# Patient Record
Sex: Female | Born: 1935
Health system: Southern US, Community
[De-identification: ages and names within clinical notes are randomized; demographics above are authoritative.]

## PROBLEM LIST (undated history)

## (undated) DIAGNOSIS — J42 Unspecified chronic bronchitis: Secondary | ICD-10-CM

## (undated) DIAGNOSIS — H44812 Hemophthalmos, left eye: Secondary | ICD-10-CM

## (undated) DIAGNOSIS — S22000A Wedge compression fracture of unspecified thoracic vertebra, initial encounter for closed fracture: Secondary | ICD-10-CM

## (undated) DIAGNOSIS — C866 Primary cutaneous CD30-positive T-cell proliferations not having achieved remission: Secondary | ICD-10-CM

## (undated) DIAGNOSIS — C50919 Malignant neoplasm of unspecified site of unspecified female breast: Secondary | ICD-10-CM

## (undated) DIAGNOSIS — C9110 Chronic lymphocytic leukemia of B-cell type not having achieved remission: Secondary | ICD-10-CM

## (undated) HISTORY — DX: Malignant neoplasm of unspecified site of unspecified female breast: C50.919

## (undated) HISTORY — PX: TONSILLECTOMY: SUR1361

## (undated) HISTORY — DX: Wedge compression fracture of unspecified thoracic vertebra, initial encounter for closed fracture: S22.000A

## (undated) HISTORY — DX: Primary cutaneous CD30-positive T-cell proliferations: C86.6

## (undated) HISTORY — PX: BREAST SURGERY: SHX581

## (undated) HISTORY — DX: Unspecified chronic bronchitis: J42

## (undated) HISTORY — DX: Primary cutaneous CD30-positive T-cell proliferations not having achieved remission: C86.60

## (undated) HISTORY — DX: Hemophthalmos, left eye: H44.812

## (undated) HISTORY — DX: Chronic lymphocytic leukemia of B-cell type not having achieved remission: C91.10

## (undated) HISTORY — PX: PARTIAL COLECTOMY: SHX5273

## (undated) HISTORY — PX: OOPHORECTOMY: SHX86

## (undated) HISTORY — PX: BILATERAL TOTAL MASTECTOMY WITH AXILLARY LYMPH NODE DISSECTION: SHX6364

## (undated) HISTORY — PX: OTHER SURGICAL HISTORY: SHX169

---

## 1978-07-30 HISTORY — PX: ABDOMINAL HYSTERECTOMY: SHX81

## 1980-07-30 HISTORY — PX: MASTECTOMY: SHX3

## 1998-05-03 ENCOUNTER — Other Ambulatory Visit: Admission: RE | Admit: 1998-05-03 | Discharge: 1998-05-03 | Payer: Self-pay | Admitting: Gynecology

## 1999-07-31 HISTORY — PX: OTHER SURGICAL HISTORY: SHX169

## 1999-09-08 ENCOUNTER — Other Ambulatory Visit: Admission: RE | Admit: 1999-09-08 | Discharge: 1999-09-08 | Payer: Self-pay | Admitting: Gynecology

## 2000-01-18 ENCOUNTER — Other Ambulatory Visit: Admission: RE | Admit: 2000-01-18 | Discharge: 2000-01-18 | Payer: Self-pay | Admitting: Plastic Surgery

## 2000-01-18 ENCOUNTER — Encounter (INDEPENDENT_AMBULATORY_CARE_PROVIDER_SITE_OTHER): Payer: Self-pay | Admitting: Specialist

## 2001-02-25 ENCOUNTER — Other Ambulatory Visit: Admission: RE | Admit: 2001-02-25 | Discharge: 2001-02-25 | Payer: Self-pay | Admitting: Gynecology

## 2002-03-09 ENCOUNTER — Other Ambulatory Visit: Admission: RE | Admit: 2002-03-09 | Discharge: 2002-03-09 | Payer: Self-pay | Admitting: Gynecology

## 2003-03-12 ENCOUNTER — Encounter: Payer: Self-pay | Admitting: Internal Medicine

## 2003-03-12 ENCOUNTER — Encounter: Admission: RE | Admit: 2003-03-12 | Discharge: 2003-03-12 | Payer: Self-pay | Admitting: Internal Medicine

## 2003-05-24 ENCOUNTER — Ambulatory Visit (HOSPITAL_COMMUNITY): Admission: RE | Admit: 2003-05-24 | Discharge: 2003-05-24 | Payer: Self-pay | Admitting: Gastroenterology

## 2003-06-10 ENCOUNTER — Other Ambulatory Visit: Admission: RE | Admit: 2003-06-10 | Discharge: 2003-06-10 | Payer: Self-pay | Admitting: Gynecology

## 2004-08-23 ENCOUNTER — Ambulatory Visit (HOSPITAL_COMMUNITY): Admission: RE | Admit: 2004-08-23 | Discharge: 2004-08-23 | Payer: Self-pay | Admitting: *Deleted

## 2004-10-09 ENCOUNTER — Encounter: Admission: RE | Admit: 2004-10-09 | Discharge: 2004-10-09 | Payer: Self-pay | Admitting: Internal Medicine

## 2004-10-17 ENCOUNTER — Other Ambulatory Visit: Admission: RE | Admit: 2004-10-17 | Discharge: 2004-10-17 | Payer: Self-pay | Admitting: Gynecology

## 2005-05-18 ENCOUNTER — Encounter: Admission: RE | Admit: 2005-05-18 | Discharge: 2005-05-18 | Payer: Self-pay | Admitting: Gastroenterology

## 2005-05-18 ENCOUNTER — Inpatient Hospital Stay (HOSPITAL_COMMUNITY): Admission: EM | Admit: 2005-05-18 | Discharge: 2005-05-23 | Payer: Self-pay | Admitting: *Deleted

## 2005-05-24 ENCOUNTER — Ambulatory Visit (HOSPITAL_COMMUNITY): Admission: RE | Admit: 2005-05-24 | Discharge: 2005-05-24 | Payer: Self-pay | Admitting: Gastroenterology

## 2005-05-28 ENCOUNTER — Ambulatory Visit (HOSPITAL_COMMUNITY): Admission: RE | Admit: 2005-05-28 | Discharge: 2005-05-28 | Payer: Self-pay | Admitting: Gastroenterology

## 2005-08-29 ENCOUNTER — Ambulatory Visit (HOSPITAL_COMMUNITY): Admission: RE | Admit: 2005-08-29 | Discharge: 2005-08-29 | Payer: Self-pay | Admitting: Gastroenterology

## 2005-10-08 ENCOUNTER — Inpatient Hospital Stay (HOSPITAL_COMMUNITY): Admission: RE | Admit: 2005-10-08 | Discharge: 2005-10-14 | Payer: Self-pay | Admitting: General Surgery

## 2005-10-08 ENCOUNTER — Encounter (INDEPENDENT_AMBULATORY_CARE_PROVIDER_SITE_OTHER): Payer: Self-pay | Admitting: *Deleted

## 2005-11-26 ENCOUNTER — Ambulatory Visit (HOSPITAL_COMMUNITY): Admission: RE | Admit: 2005-11-26 | Discharge: 2005-11-26 | Payer: Self-pay | Admitting: Gastroenterology

## 2005-12-19 ENCOUNTER — Other Ambulatory Visit: Admission: RE | Admit: 2005-12-19 | Discharge: 2005-12-19 | Payer: Self-pay | Admitting: Gynecology

## 2006-04-19 ENCOUNTER — Encounter: Admission: RE | Admit: 2006-04-19 | Discharge: 2006-04-19 | Payer: Self-pay | Admitting: Internal Medicine

## 2006-05-22 ENCOUNTER — Ambulatory Visit (HOSPITAL_COMMUNITY): Admission: RE | Admit: 2006-05-22 | Discharge: 2006-05-24 | Payer: Self-pay

## 2006-11-11 ENCOUNTER — Ambulatory Visit (HOSPITAL_BASED_OUTPATIENT_CLINIC_OR_DEPARTMENT_OTHER): Admission: RE | Admit: 2006-11-11 | Discharge: 2006-11-11 | Payer: Self-pay | Admitting: Surgery

## 2006-11-11 ENCOUNTER — Encounter (INDEPENDENT_AMBULATORY_CARE_PROVIDER_SITE_OTHER): Payer: Self-pay | Admitting: *Deleted

## 2006-12-31 ENCOUNTER — Other Ambulatory Visit: Admission: RE | Admit: 2006-12-31 | Discharge: 2006-12-31 | Payer: Self-pay | Admitting: Gynecology

## 2007-01-20 ENCOUNTER — Ambulatory Visit: Payer: Self-pay | Admitting: Internal Medicine

## 2007-04-08 DIAGNOSIS — Z8719 Personal history of other diseases of the digestive system: Secondary | ICD-10-CM

## 2007-05-09 ENCOUNTER — Ambulatory Visit: Payer: Self-pay | Admitting: Internal Medicine

## 2007-06-30 ENCOUNTER — Ambulatory Visit: Payer: Self-pay | Admitting: Internal Medicine

## 2007-06-30 DIAGNOSIS — R197 Diarrhea, unspecified: Secondary | ICD-10-CM

## 2007-10-26 ENCOUNTER — Encounter: Payer: Self-pay | Admitting: Internal Medicine

## 2007-10-27 ENCOUNTER — Ambulatory Visit: Payer: Self-pay | Admitting: Internal Medicine

## 2007-10-27 DIAGNOSIS — J4489 Other specified chronic obstructive pulmonary disease: Secondary | ICD-10-CM | POA: Insufficient documentation

## 2007-10-27 DIAGNOSIS — M810 Age-related osteoporosis without current pathological fracture: Secondary | ICD-10-CM

## 2007-10-27 DIAGNOSIS — E785 Hyperlipidemia, unspecified: Secondary | ICD-10-CM

## 2007-10-27 DIAGNOSIS — J449 Chronic obstructive pulmonary disease, unspecified: Secondary | ICD-10-CM | POA: Insufficient documentation

## 2007-10-27 DIAGNOSIS — Z85828 Personal history of other malignant neoplasm of skin: Secondary | ICD-10-CM

## 2007-10-27 LAB — CONVERTED CEMR LAB
ALT: 38 units/L — ABNORMAL HIGH (ref 0–35)
AST: 37 units/L (ref 0–37)
Albumin: 3.3 g/dL — ABNORMAL LOW (ref 3.5–5.2)
Alkaline Phosphatase: 52 units/L (ref 39–117)
BUN: 16 mg/dL (ref 6–23)
Basophils Absolute: 0 10*3/uL (ref 0.0–0.1)
Basophils Relative: 0 % (ref 0.0–1.0)
Bilirubin, Direct: 0.2 mg/dL (ref 0.0–0.3)
CO2: 28 meq/L (ref 19–32)
Calcium: 9 mg/dL (ref 8.4–10.5)
Chloride: 106 meq/L (ref 96–112)
Cholesterol: 198 mg/dL (ref 0–200)
Creatinine, Ser: 0.7 mg/dL (ref 0.4–1.2)
Eosinophils Absolute: 0.2 10*3/uL (ref 0.0–0.7)
Eosinophils Relative: 1.5 % (ref 0.0–5.0)
GFR calc Af Amer: 106 mL/min
GFR calc non Af Amer: 88 mL/min
Glucose, Bld: 90 mg/dL (ref 70–99)
HCT: 43.6 % (ref 36.0–46.0)
HDL: 56.9 mg/dL (ref >39.0)
Hemoglobin: 14 g/dL (ref 12.0–15.0)
LDL Cholesterol: 129 mg/dL — ABNORMAL HIGH (ref 0–99)
Lymphocytes Relative: 70.3 % — ABNORMAL HIGH (ref 12.0–46.0)
MCHC: 32 g/dL (ref 30.0–36.0)
MCV: 102.4 fL — ABNORMAL HIGH (ref 78.0–100.0)
Monocytes Absolute: 1 10*3/uL (ref 0.1–1.0)
Monocytes Relative: 6.5 % (ref 3.0–12.0)
Neutro Abs: 3.3 10*3/uL (ref 1.4–7.7)
Neutrophils Relative %: 21.7 % — ABNORMAL LOW (ref 43.0–77.0)
Platelets: 179 10*3/uL (ref 150–400)
Potassium: 4 meq/L (ref 3.5–5.1)
RBC: 4.26 M/uL (ref 3.87–5.11)
RDW: 12.7 % (ref 11.5–14.6)
Sodium: 140 meq/L (ref 135–145)
TSH: 1.57 microintl units/mL (ref 0.35–5.50)
Total Bilirubin: 1 mg/dL (ref 0.3–1.2)
Total CHOL/HDL Ratio: 3.5
Total Protein: 6.8 g/dL (ref 6.0–8.3)
Triglycerides: 61 mg/dL (ref 0–149)
VLDL: 12 mg/dL (ref 0–40)
WBC: 15.1 10*3/uL — ABNORMAL HIGH (ref 4.5–10.5)

## 2008-01-01 ENCOUNTER — Other Ambulatory Visit: Admission: RE | Admit: 2008-01-01 | Discharge: 2008-01-01 | Payer: Self-pay | Admitting: Gynecology

## 2008-02-02 ENCOUNTER — Ambulatory Visit: Payer: Self-pay | Admitting: Internal Medicine

## 2008-02-02 DIAGNOSIS — J029 Acute pharyngitis, unspecified: Secondary | ICD-10-CM

## 2008-04-17 ENCOUNTER — Ambulatory Visit: Payer: Self-pay | Admitting: Family Medicine

## 2008-04-17 ENCOUNTER — Telehealth (INDEPENDENT_AMBULATORY_CARE_PROVIDER_SITE_OTHER): Payer: Self-pay | Admitting: *Deleted

## 2008-04-17 DIAGNOSIS — J069 Acute upper respiratory infection, unspecified: Secondary | ICD-10-CM | POA: Insufficient documentation

## 2008-05-06 ENCOUNTER — Ambulatory Visit: Payer: Self-pay | Admitting: Internal Medicine

## 2008-07-05 ENCOUNTER — Ambulatory Visit: Payer: Self-pay | Admitting: Gynecology

## 2008-10-28 ENCOUNTER — Ambulatory Visit: Payer: Self-pay | Admitting: Internal Medicine

## 2008-10-28 DIAGNOSIS — I872 Venous insufficiency (chronic) (peripheral): Secondary | ICD-10-CM | POA: Insufficient documentation

## 2009-01-16 ENCOUNTER — Emergency Department (HOSPITAL_COMMUNITY): Admission: EM | Admit: 2009-01-16 | Discharge: 2009-01-16 | Payer: Self-pay | Admitting: Emergency Medicine

## 2009-01-20 ENCOUNTER — Ambulatory Visit: Payer: Self-pay | Admitting: Internal Medicine

## 2009-01-20 DIAGNOSIS — M1990 Unspecified osteoarthritis, unspecified site: Secondary | ICD-10-CM

## 2009-02-17 ENCOUNTER — Ambulatory Visit: Payer: Self-pay | Admitting: Internal Medicine

## 2009-02-17 LAB — CONVERTED CEMR LAB
ALT: 19 U/L
AST: 27 U/L
Albumin: 4 g/dL
Alkaline Phosphatase: 70 U/L
BUN: 12 mg/dL
Basophils Absolute: 0 K/uL
Basophils Relative: 0.1 %
Bilirubin, Direct: 0.1 mg/dL
CO2: 31 meq/L
Calcium: 9.2 mg/dL
Chloride: 109 meq/L
Cholesterol: 192 mg/dL
Creatinine, Ser: 0.8 mg/dL
Eosinophils Absolute: 0.2 K/uL
Eosinophils Relative: 1.7 %
GFR calc non Af Amer: 75.03 mL/min
Glucose, Bld: 97 mg/dL
HCT: 42.4 %
HDL: 58.5 mg/dL
Hemoglobin: 14.4 g/dL
LDL Cholesterol: 119 mg/dL — ABNORMAL HIGH
Lymphocytes Relative: 71.9 % — ABNORMAL HIGH
Lymphs Abs: 10.5 K/uL — ABNORMAL HIGH
MCHC: 33.9 g/dL
MCV: 101 fL — ABNORMAL HIGH
Monocytes Absolute: 0.7 K/uL
Monocytes Relative: 5.1 %
Neutro Abs: 3.1 K/uL
Neutrophils Relative %: 21.2 % — ABNORMAL LOW
Platelets: 159 K/uL
Potassium: 4 meq/L
RBC: 4.2 M/uL
RDW: 12.5 %
Sodium: 148 meq/L — ABNORMAL HIGH
TSH: 0.97 u[IU]/mL
Total Bilirubin: 1.2 mg/dL
Total CHOL/HDL Ratio: 3
Total Protein: 7.5 g/dL
Triglycerides: 74 mg/dL
VLDL: 14.8 mg/dL
WBC: 14.5 10*3/microliter — ABNORMAL HIGH

## 2009-02-21 LAB — CONVERTED CEMR LAB: Vit D, 25-Hydroxy: 29 ng/mL — ABNORMAL LOW (ref 30–89)

## 2009-02-28 ENCOUNTER — Ambulatory Visit: Payer: Self-pay | Admitting: Gynecology

## 2009-02-28 ENCOUNTER — Encounter: Payer: Self-pay | Admitting: Internal Medicine

## 2009-03-02 ENCOUNTER — Ambulatory Visit: Payer: Self-pay | Admitting: Internal Medicine

## 2009-03-02 DIAGNOSIS — C9110 Chronic lymphocytic leukemia of B-cell type not having achieved remission: Secondary | ICD-10-CM | POA: Insufficient documentation

## 2009-03-08 ENCOUNTER — Ambulatory Visit: Payer: Self-pay | Admitting: Oncology

## 2009-03-15 ENCOUNTER — Encounter: Payer: Self-pay | Admitting: Internal Medicine

## 2009-03-15 ENCOUNTER — Other Ambulatory Visit: Admission: RE | Admit: 2009-03-15 | Discharge: 2009-03-15 | Payer: Self-pay | Admitting: Oncology

## 2009-03-15 ENCOUNTER — Encounter: Payer: Self-pay | Admitting: Oncology

## 2009-03-15 LAB — CMP (CANCER CENTER ONLY)
ALT(SGPT): 30 U/L (ref 10–47)
AST: 29 U/L (ref 11–38)
Albumin: 3.2 g/dL — ABNORMAL LOW (ref 3.3–5.5)
Alkaline Phosphatase: 54 U/L (ref 26–84)
Calcium: 9.2 mg/dL (ref 8.0–10.3)
Chloride: 106 mEq/L (ref 98–108)
Potassium: 4.2 mEq/L (ref 3.3–4.7)
Sodium: 139 mEq/L (ref 128–145)
Total Protein: 6.7 g/dL (ref 6.4–8.1)

## 2009-03-15 LAB — CBC WITH DIFFERENTIAL (CANCER CENTER ONLY)
Eosinophils Absolute: 0.4 10*3/uL (ref 0.0–0.5)
HGB: 14.1 g/dL (ref 11.6–15.9)
LYMPH#: 12.3 10*3/uL — ABNORMAL HIGH (ref 0.9–3.3)
MCH: 33.5 pg (ref 26.0–34.0)
MONO#: 0.8 10*3/uL (ref 0.1–0.9)
MONO%: 4.7 % (ref 0.0–13.0)
NEUT#: 3.2 10*3/uL (ref 1.5–6.5)
Platelets: 147 10*3/uL (ref 145–400)
RBC: 4.2 10*6/uL (ref 3.70–5.32)
WBC: 17 10*3/uL — ABNORMAL HIGH (ref 3.9–10.0)

## 2009-03-15 LAB — MORPHOLOGY - CHCC SATELLITE: PLT EST ~~LOC~~: ADEQUATE

## 2009-03-16 LAB — HAPTOGLOBIN: Haptoglobin: 91 mg/dL (ref 16–200)

## 2009-03-16 LAB — DIRECT ANTIGLOBULIN TEST (NOT AT ARMC)
DAT (Complement): NEGATIVE
DAT IgG: NEGATIVE

## 2009-03-24 ENCOUNTER — Encounter: Payer: Self-pay | Admitting: Internal Medicine

## 2009-04-26 ENCOUNTER — Ambulatory Visit: Payer: Self-pay | Admitting: Internal Medicine

## 2009-05-27 ENCOUNTER — Encounter (INDEPENDENT_AMBULATORY_CARE_PROVIDER_SITE_OTHER): Payer: Self-pay | Admitting: *Deleted

## 2009-05-31 ENCOUNTER — Encounter: Admission: RE | Admit: 2009-05-31 | Discharge: 2009-05-31 | Payer: Self-pay | Admitting: Surgery

## 2009-06-08 ENCOUNTER — Encounter: Payer: Self-pay | Admitting: Internal Medicine

## 2009-10-25 ENCOUNTER — Ambulatory Visit: Payer: Self-pay | Admitting: Oncology

## 2009-10-27 ENCOUNTER — Encounter: Payer: Self-pay | Admitting: Internal Medicine

## 2009-10-27 LAB — CMP (CANCER CENTER ONLY)
ALT(SGPT): 25 U/L (ref 10–47)
AST: 25 U/L (ref 11–38)
Albumin: 4 g/dL (ref 3.3–5.5)
Alkaline Phosphatase: 72 U/L (ref 26–84)
BUN, Bld: 13 mg/dL (ref 7–22)
CO2: 31 mEq/L (ref 18–33)
Calcium: 9.3 mg/dL (ref 8.0–10.3)
Chloride: 101 mEq/L (ref 98–108)
Creat: 0.8 mg/dl (ref 0.6–1.2)
Glucose, Bld: 99 mg/dL (ref 73–118)
Potassium: 4.4 mEq/L (ref 3.3–4.7)
Sodium: 139 mEq/L (ref 128–145)
Total Bilirubin: 0.9 mg/dl (ref 0.20–1.60)
Total Protein: 7.3 g/dL (ref 6.4–8.1)

## 2009-10-27 LAB — CBC WITH DIFFERENTIAL (CANCER CENTER ONLY)
BASO#: 0.3 10*3/uL — ABNORMAL HIGH (ref 0.0–0.2)
BASO%: 2 % (ref 0.0–2.0)
EOS%: 1.1 % (ref 0.0–7.0)
Eosinophils Absolute: 0.2 10*3/uL (ref 0.0–0.5)
HCT: 41.6 % (ref 34.8–46.6)
HGB: 14.2 g/dL (ref 11.6–15.9)
LYMPH#: 10.8 10*3/uL — ABNORMAL HIGH (ref 0.9–3.3)
LYMPH%: 72.1 % — ABNORMAL HIGH (ref 14.0–48.0)
MCH: 34.2 pg — ABNORMAL HIGH (ref 26.0–34.0)
MCHC: 34.3 g/dL (ref 32.0–36.0)
MCV: 100 fL (ref 81–101)
MONO#: 0.7 10*3/uL (ref 0.1–0.9)
MONO%: 4.4 % (ref 0.0–13.0)
NEUT#: 3.1 10*3/uL (ref 1.5–6.5)
NEUT%: 20.4 % — ABNORMAL LOW (ref 39.6–80.0)
Platelets: 162 10*3/uL (ref 145–400)
RBC: 4.17 10*6/uL (ref 3.70–5.32)
RDW: 11.1 % (ref 10.5–14.6)
WBC: 15 10*3/uL — ABNORMAL HIGH (ref 3.9–10.0)

## 2009-10-27 LAB — TECHNOLOGIST REVIEW CHCC SATELLITE

## 2009-10-28 LAB — IGG, IGA, IGM
IgA: 233 mg/dL (ref 68–378)
IgG (Immunoglobin G), Serum: 1040 mg/dL (ref 694–1618)
IgM, Serum: 59 mg/dL — ABNORMAL LOW (ref 60–263)

## 2009-10-28 LAB — DIRECT ANTIGLOBULIN TEST (NOT AT ARMC)
DAT (Complement): NEGATIVE
DAT IgG: NEGATIVE

## 2009-10-28 LAB — HAPTOGLOBIN: Haptoglobin: 135 mg/dL (ref 16–200)

## 2010-03-21 ENCOUNTER — Ambulatory Visit: Payer: Self-pay | Admitting: Gynecology

## 2010-03-21 ENCOUNTER — Other Ambulatory Visit: Admission: RE | Admit: 2010-03-21 | Discharge: 2010-03-21 | Payer: Self-pay | Admitting: Gynecology

## 2010-03-27 ENCOUNTER — Ambulatory Visit: Payer: Self-pay | Admitting: Gynecology

## 2010-04-07 ENCOUNTER — Ambulatory Visit (HOSPITAL_BASED_OUTPATIENT_CLINIC_OR_DEPARTMENT_OTHER): Payer: BC Managed Care – PPO | Admitting: Oncology

## 2010-04-13 LAB — CMP (CANCER CENTER ONLY)
ALT(SGPT): 40 U/L (ref 10–47)
AST: 31 U/L (ref 11–38)
Albumin: 4.1 g/dL (ref 3.3–5.5)
Alkaline Phosphatase: 74 U/L (ref 26–84)
BUN, Bld: 11 mg/dL (ref 7–22)
CO2: 28 mEq/L (ref 18–33)
Calcium: 9.2 mg/dL (ref 8.0–10.3)
Chloride: 100 mEq/L (ref 98–108)
Creat: 0.7 mg/dl (ref 0.6–1.2)
Glucose, Bld: 98 mg/dL (ref 73–118)
Potassium: 4.6 mEq/L (ref 3.3–4.7)
Sodium: 140 mEq/L (ref 128–145)
Total Bilirubin: 0.9 mg/dl (ref 0.20–1.60)
Total Protein: 7.1 g/dL (ref 6.4–8.1)

## 2010-04-13 LAB — MANUAL DIFFERENTIAL (CHCC SATELLITE)
ALC: 16.2 10*3/uL — ABNORMAL HIGH (ref 0.6–2.2)
ANC (CHCC HP manual diff): 3.4 10*3/uL (ref 1.5–6.7)
LYMPH: 78 % — ABNORMAL HIGH (ref 14–48)
MONO: 3 % (ref 0–13)
Metamyelocytes: 1 % — ABNORMAL HIGH (ref 0–0)
PLT EST ~~LOC~~: ADEQUATE
Platelet Morphology: NORMAL
RBC Comments: NORMAL
SEG: 16 % — ABNORMAL LOW (ref 40–75)
Variant Lymph: 2 % — ABNORMAL HIGH (ref 0–0)

## 2010-04-13 LAB — CBC WITH DIFFERENTIAL (CANCER CENTER ONLY)
HCT: 41.1 % (ref 34.8–46.6)
HGB: 14.4 g/dL (ref 11.6–15.9)
MCH: 34.8 pg — ABNORMAL HIGH (ref 26.0–34.0)
MCHC: 35 g/dL (ref 32.0–36.0)
MCV: 99 fL (ref 81–101)
Platelets: 161 10*3/uL (ref 145–400)
RBC: 4.13 10*6/uL (ref 3.70–5.32)
RDW: 11.2 % (ref 10.5–14.6)
WBC: 20.3 10*3/uL — ABNORMAL HIGH (ref 3.9–10.0)

## 2010-04-14 LAB — IGG, IGA, IGM
IgA: 253 mg/dL (ref 68–378)
IgG (Immunoglobin G), Serum: 1100 mg/dL (ref 694–1618)
IgM, Serum: 56 mg/dL — ABNORMAL LOW (ref 60–263)

## 2010-04-14 LAB — DIRECT ANTIGLOBULIN TEST (NOT AT ARMC)
DAT (Complement): NEGATIVE
DAT IgG: NEGATIVE

## 2010-04-14 LAB — HAPTOGLOBIN: Haptoglobin: 115 mg/dL (ref 16–200)

## 2010-04-25 ENCOUNTER — Ambulatory Visit: Payer: Self-pay | Admitting: Gynecology

## 2010-04-26 ENCOUNTER — Ambulatory Visit: Payer: Self-pay | Admitting: Gynecology

## 2010-04-27 ENCOUNTER — Encounter: Payer: Self-pay | Admitting: Internal Medicine

## 2010-05-02 ENCOUNTER — Ambulatory Visit: Payer: Self-pay | Admitting: Internal Medicine

## 2010-05-03 ENCOUNTER — Ambulatory Visit: Payer: Self-pay | Admitting: Gynecology

## 2010-05-30 ENCOUNTER — Ambulatory Visit: Payer: Self-pay | Admitting: Gynecology

## 2010-06-20 ENCOUNTER — Ambulatory Visit: Payer: Self-pay | Admitting: Gynecology

## 2010-08-19 ENCOUNTER — Encounter: Payer: Self-pay | Admitting: Gastroenterology

## 2010-08-20 ENCOUNTER — Encounter: Payer: Self-pay | Admitting: Surgery

## 2010-08-31 ENCOUNTER — Encounter: Payer: Medicare Other | Admitting: Oncology

## 2010-08-31 DIAGNOSIS — D72820 Lymphocytosis (symptomatic): Secondary | ICD-10-CM

## 2010-08-31 DIAGNOSIS — M810 Age-related osteoporosis without current pathological fracture: Secondary | ICD-10-CM

## 2010-08-31 DIAGNOSIS — C9110 Chronic lymphocytic leukemia of B-cell type not having achieved remission: Secondary | ICD-10-CM

## 2010-08-31 DIAGNOSIS — D72829 Elevated white blood cell count, unspecified: Secondary | ICD-10-CM

## 2010-08-31 LAB — CBC WITH DIFFERENTIAL/PLATELET
BASO%: 0.2 % (ref 0.0–2.0)
Basophils Absolute: 0 10*3/uL (ref 0.0–0.1)
EOS%: 0.4 % (ref 0.0–7.0)
Eosinophils Absolute: 0.1 10*3/uL (ref 0.0–0.5)
HCT: 39.9 % (ref 34.8–46.6)
HGB: 13.3 g/dL (ref 11.6–15.9)
LYMPH%: 83.5 % — ABNORMAL HIGH (ref 14.0–49.7)
MCH: 33.9 pg (ref 25.1–34.0)
MCHC: 33.5 g/dL (ref 31.5–36.0)
MCV: 101.2 fL — ABNORMAL HIGH (ref 79.5–101.0)
MONO#: 0.8 10*3/uL (ref 0.1–0.9)
MONO%: 3.6 % (ref 0.0–14.0)
NEUT#: 2.6 10*3/uL (ref 1.5–6.5)
NEUT%: 12.3 % — ABNORMAL LOW (ref 38.4–76.8)
Platelets: 155 10*3/uL (ref 145–400)
RBC: 3.94 10*6/uL (ref 3.70–5.45)
RDW: 13.7 % (ref 11.2–14.5)
WBC: 21.4 10*3/uL — ABNORMAL HIGH (ref 3.9–10.3)
lymph#: 17.8 10*3/uL — ABNORMAL HIGH (ref 0.9–3.3)

## 2010-08-31 LAB — MORPHOLOGY
PLT EST: ADEQUATE
RBC Comments: NORMAL

## 2010-08-31 NOTE — Assessment & Plan Note (Signed)
Summary: flu shot//ccm  Nurse Visit   Allergies: 1)  ! Levaquin  Orders Added: 1)  Flu Vaccine 50yrs + MEDICARE PATIENTS [Q2039] 2)  Administration Flu vaccine - MCR [G0008] Flu Vaccine Consent Questions     Do you have a history of severe allergic reactions to this vaccine? no    Any prior history of allergic reactions to egg and/or gelatin? no    Do you have a sensitivity to the preservative Thimersol? no    Do you have a past history of Guillan-Barre Syndrome? no    Do you currently have an acute febrile illness? no    Have you ever had a severe reaction to latex? no    Vaccine information given and explained to patient? yes    Are you currently pregnant? no    Lot Number:[id number]   Exp Date:01/27/2011   Site Given  Left Deltoid IM.lbmedflu

## 2010-08-31 NOTE — Letter (Signed)
Summary: [facility]  [facility]   Imported By: Maryln Gottron 05/12/2010 14:33:08  _____________________________________________________________________  External Attachment:    Type:   Image     Comment:   External Document

## 2010-08-31 NOTE — Letter (Signed)
[facility]   Imported By: Maryln Gottron 11/10/2009 13:49:04  _____________________________________________________________________  External Attachment:    Type:   Image     Comment:   External Document

## 2010-10-18 ENCOUNTER — Ambulatory Visit (INDEPENDENT_AMBULATORY_CARE_PROVIDER_SITE_OTHER): Payer: Medicare Other | Admitting: Gynecology

## 2010-10-18 DIAGNOSIS — B373 Candidiasis of vulva and vagina: Secondary | ICD-10-CM

## 2010-10-18 DIAGNOSIS — N898 Other specified noninflammatory disorders of vagina: Secondary | ICD-10-CM

## 2010-10-18 DIAGNOSIS — L293 Anogenital pruritus, unspecified: Secondary | ICD-10-CM

## 2010-10-18 DIAGNOSIS — N952 Postmenopausal atrophic vaginitis: Secondary | ICD-10-CM

## 2010-10-20 ENCOUNTER — Encounter (HOSPITAL_BASED_OUTPATIENT_CLINIC_OR_DEPARTMENT_OTHER): Payer: Medicare Other | Admitting: Oncology

## 2010-10-20 ENCOUNTER — Other Ambulatory Visit: Payer: Self-pay | Admitting: Oncology

## 2010-10-20 DIAGNOSIS — D72829 Elevated white blood cell count, unspecified: Secondary | ICD-10-CM

## 2010-10-20 DIAGNOSIS — D72820 Lymphocytosis (symptomatic): Secondary | ICD-10-CM

## 2010-10-20 DIAGNOSIS — C9110 Chronic lymphocytic leukemia of B-cell type not having achieved remission: Secondary | ICD-10-CM

## 2010-10-20 DIAGNOSIS — M810 Age-related osteoporosis without current pathological fracture: Secondary | ICD-10-CM

## 2010-10-20 LAB — CBC & DIFF AND RETIC
BASO%: 0.2 % (ref 0.0–2.0)
Basophils Absolute: 0 10*3/uL (ref 0.0–0.1)
EOS%: 0.9 % (ref 0.0–7.0)
Eosinophils Absolute: 0.2 10*3/uL (ref 0.0–0.5)
HCT: 41.5 % (ref 34.8–46.6)
HGB: 14 g/dL (ref 11.6–15.9)
Immature Retic Fract: 6 % (ref 0.00–10.70)
LYMPH%: 73.5 % — ABNORMAL HIGH (ref 14.0–49.7)
MCH: 33.8 pg (ref 25.1–34.0)
MCHC: 33.7 g/dL (ref 31.5–36.0)
MCV: 100.2 fL (ref 79.5–101.0)
MONO#: 0.7 10*3/uL (ref 0.1–0.9)
MONO%: 4.3 % (ref 0.0–14.0)
NEUT#: 3.6 10*3/uL (ref 1.5–6.5)
NEUT%: 21.1 % — ABNORMAL LOW (ref 38.4–76.8)
Platelets: 169 10*3/uL (ref 145–400)
RBC: 4.14 10*6/uL (ref 3.70–5.45)
RDW: 13.3 % (ref 11.2–14.5)
Retic %: 0.81 % (ref 0.50–1.50)
Retic Ct Abs: 33.53 10*3/uL (ref 18.30–72.70)
WBC: 16.8 10*3/uL — ABNORMAL HIGH (ref 3.9–10.3)
lymph#: 12.4 10*3/uL — ABNORMAL HIGH (ref 0.9–3.3)
nRBC: 0 % (ref 0–0)

## 2010-10-20 LAB — MORPHOLOGY: PLT EST: ADEQUATE

## 2010-11-06 LAB — COMPREHENSIVE METABOLIC PANEL
ALT: 30 U/L (ref 0–35)
AST: 37 U/L (ref 0–37)
Albumin: 3.9 g/dL (ref 3.5–5.2)
Alkaline Phosphatase: 53 U/L (ref 39–117)
BUN: 7 mg/dL (ref 6–23)
Chloride: 107 mEq/L (ref 96–112)
GFR calc Af Amer: >60 mL/min (ref >60)
Potassium: 4.1 mEq/L (ref 3.5–5.1)
Total Bilirubin: 0.6 mg/dL (ref 0.3–1.2)

## 2010-11-06 LAB — URINALYSIS, ROUTINE W REFLEX MICROSCOPIC
Hgb urine dipstick: NEGATIVE
Specific Gravity, Urine: 1.007 (ref 1.005–1.030)
Urobilinogen, UA: 0.2 mg/dL (ref 0.0–1.0)

## 2010-11-06 LAB — URINE CULTURE

## 2010-11-06 LAB — DIFFERENTIAL
Band Neutrophils: 0 % (ref 0–10)
Basophils Absolute: 0 10*3/uL (ref 0.0–0.1)
Basophils Relative: 0 % (ref 0–1)
Myelocytes: 0 %
Promyelocytes Absolute: 0 %

## 2010-11-06 LAB — CBC
HCT: 41.1 % (ref 36.0–46.0)
Hemoglobin: 14.4 g/dL (ref 12.0–15.0)
RDW: 13.2 % (ref 11.5–15.5)

## 2010-11-06 LAB — D-DIMER, QUANTITATIVE: D-Dimer, Quant: 0.22 ug/mL-FEU (ref 0.00–0.48)

## 2010-11-06 LAB — CK TOTAL AND CKMB (NOT AT ARMC): CK, MB: 1 ng/mL (ref 0.3–4.0)

## 2010-11-06 LAB — PATHOLOGIST SMEAR REVIEW

## 2010-12-04 ENCOUNTER — Other Ambulatory Visit: Payer: Self-pay | Admitting: Oncology

## 2010-12-04 ENCOUNTER — Encounter (HOSPITAL_BASED_OUTPATIENT_CLINIC_OR_DEPARTMENT_OTHER): Payer: Medicare Other | Admitting: Oncology

## 2010-12-04 DIAGNOSIS — C9110 Chronic lymphocytic leukemia of B-cell type not having achieved remission: Secondary | ICD-10-CM

## 2010-12-04 DIAGNOSIS — D72829 Elevated white blood cell count, unspecified: Secondary | ICD-10-CM

## 2010-12-04 DIAGNOSIS — M810 Age-related osteoporosis without current pathological fracture: Secondary | ICD-10-CM

## 2010-12-04 DIAGNOSIS — D72820 Lymphocytosis (symptomatic): Secondary | ICD-10-CM

## 2010-12-04 LAB — CBC WITH DIFFERENTIAL/PLATELET
Basophils Absolute: 0 10*3/uL (ref 0.0–0.1)
EOS%: 1.3 % (ref 0.0–7.0)
Eosinophils Absolute: 0.2 10*3/uL (ref 0.0–0.5)
HCT: 41.2 % (ref 34.8–46.6)
HGB: 13.9 g/dL (ref 11.6–15.9)
LYMPH%: 72.7 % — ABNORMAL HIGH (ref 14.0–49.7)
MCH: 34 pg (ref 25.1–34.0)
MCV: 101.3 fL — ABNORMAL HIGH (ref 79.5–101.0)
MONO%: 5.2 % (ref 0.0–14.0)
NEUT#: 3.8 10*3/uL (ref 1.5–6.5)
NEUT%: 20.6 % — ABNORMAL LOW (ref 38.4–76.8)
Platelets: 163 10*3/uL (ref 145–400)
RDW: 13.2 % (ref 11.2–14.5)

## 2010-12-04 LAB — COMPREHENSIVE METABOLIC PANEL
Albumin: 3.9 g/dL (ref 3.5–5.2)
Alkaline Phosphatase: 70 U/L (ref 39–117)
BUN: 13 mg/dL (ref 6–23)
Calcium: 9.5 mg/dL (ref 8.4–10.5)
Chloride: 104 mEq/L (ref 96–112)
Glucose, Bld: 110 mg/dL — ABNORMAL HIGH (ref 70–99)
Potassium: 4.2 mEq/L (ref 3.5–5.3)
Sodium: 139 mEq/L (ref 135–145)
Total Protein: 6.7 g/dL (ref 6.0–8.3)

## 2010-12-12 NOTE — Assessment & Plan Note (Signed)
[name] [name]                            [facility] OFFICE NOTE   NAME:[name] [name] NEVELS                       MRN:          [id number]  DATE:01/20/2007                            DOB:          11/25/1934    [age]-year-old female seen today to establish with our practice.  She is followed closely by Gynecology.  Approximately 15 months ago, she  underwent partial sigmoid colectomy for diverticular disease.  She has  had a remote hysterectomy with incidental appendectomy.  She has also  had a right salpingectomy.  There is history of some asthma for which  she has seen Dr. Stevphen [name] in the past.  She only smoked for a  couple of years in college.  She has a history of stress hyperglycemia,  but blood sugars and hemoglobin A1cs have generally been normal.  She  has a remote tonsillectomy in 1966, in 1971 had a nasal septoplasty.  She has had some gynecologic surgery with a vaginoplasty in 1980.  She  has had bilateral reduction mammoplasties in 1982 followed by bilateral  mastectomies due to microscopic breast cancer.  She has some breast  reconstructive surgery as well.  Additionally, she has had left elbow  surgery and a hernia repair in 2007.  She is followed closely by  Gynecology and is on weekly fluconazole for recurrent yeast infections.   FAMILY HISTORY:  Both parents are deceased at age 88.  Mother had  coronary artery disease and osteoporosis.  Two brothers, 1 died at 1 of  Hodgkin's disease, the other one at approximately age 48 of  complications of Parkinson's disease.  One sister has asthma.   PHYSICAL EXAMINATION:  Exam revealed an elderly thin white female in no  acute distress.  Blood pressure is low-normal.  SKIN:  Negative.  Fundi, ears, nose and throat clear.  NECK:  No bruits or thyroid enlargement.  No adenopathy.  CHEST:  Clear.  CARDIOVASCULAR:  Normal heart sounds.  No murmurs.  ABDOMEN:  Soft and nontender.  No  organomegaly.  No bruits are  appreciated.  EXTREMITIES:  Revealed intact peripheral pulses; they are slightly  diminished on the right.  NEUROLOGIC:  Intact.   IMPRESSION:  1. History of diverticular disease, status post partial sigmoid      colectomy.  2. History of stress hyperglycemia.  3. Osteopenia.   DISPOSITION:  The patient will be followed up in the fall for a flu  vaccine.  We will do some updated labs including hemoglobin A1c also at  that time.  We will continue her close GYN followup and will be  considered for biphosphonate therapy if required.    April Savers, MD  Electronically Signed   PFK/MedQ  DD: 01/20/2007  DT: 01/21/2007  Job #: [id number]

## 2010-12-15 NOTE — H&P (Signed)
April [name], April [name]                ACCOUNT NO.:  [id number]   MEDICAL RECORD NO.:  [id number]          PATIENT TYPE:  INP   LOCATION:  1507                         FACILITY:  [facility]   PHYSICIAN:  Althea Grimmer. Santogade, M.D.DATE OF BIRTH:  03/11/1935   DATE OF ADMISSION:  05/18/2005  DATE OF DISCHARGE:                                HISTORY & PHYSICAL   [name] is a 76 year old female who has had ongoing problem with  diverticulitis for approximately 7 weeks. While traveling in [place], she  was admitted for diverticulitis and treated with Levaquin and Flagyl. She  had an adverse reaction with rash to Levaquin which has happened before.  After returning home, she presented to Dr. Patty Sermons office with persisting  pain and a white blood count of 13,000. She was seen by Dr. Madilyn Fireman who  continued the Flagyl and initially she seemed to improve. Yesterday, her  white blood count was 10.9 and hemoglobin 13.3; however, she began having  worse pain in the suprapubic area and had a CT today that demonstrates mild  wall thickening and inflammation along the mid distal sigmoid colon which  appears to be worse compared to September 10 and there is now a focal  extraluminal gas collection with surrounding inflammation. The patient also  has had problems with intermittent incontinence. She had anorectal manometry  studies done by me last year that demonstrated mild decrease in sphincter  pressure but a moderate decrease in sensation. Her threshold volume of  sensation in the rectum was 45 mL with a normal of 20 or less.   PAST MEDICAL HISTORY:  Recent sinus infection, bilateral mastectomies 25  years ago for cancer without recurrence, cesarean section and fungal toe  nail infection.   CURRENT MEDICATIONS:  Has completed Flagyl and not on anything at present.   ALLERGIES:  LEVAQUIN.   FAMILY HISTORY:  Her mother also had diverticulitis, no colorectal cancer.   REVIEW OF SYMPTOMS:  GENERAL:  An  8 pound weight loss with current  infection. SKIN:  No rash or pruritus. EYES:  No icterus or change in  vision. ENT:  Recent sinus problems resolved. RESPIRATORY:  No shortness of  breath, cough or wheezing. CARDIAC:  No chest pain, palpitations or history  of valvular heart disease. GI:  As above. GU:  She says she feels pressure  in the bladder area since the diverticulitis started. The remainder of the  review of systems is negative.   PHYSICAL EXAMINATION:  VITAL SIGNS:  She is afebrile with stable vital signs  in no acute distress.  SKIN:  Normal.  HEENT:  Eyes anicteric. Conjunctiva pink. Oropharynx unremarkable.  NECK:  Supple. There is a single 1.5 cm right anterior cervical node.  CHEST:  Clear.  HEART:  Regular rate and rhythm.  BREASTS:  She is status post bilateral mastectomies.  ABDOMEN:  Soft, bowel sounds are present. She is moderately tender in the  suprapubic area more on the left side then the right.  RECTAL:  Not performed.  EXTREMITIES:  Without cyanosis, clubbing, edema or rash.   IMPRESSION:  Unrelenting  diverticulitis despite antibiotics now with a  probable focal perforation and abscess formation.   PLAN:  The patient is admitted to the hospital for IV antibiotics and blood  cultures. She will be started on Unasyn. I am requesting that the surgeons  follow along in case she needs abscess drainage which might be performed  radiographically or requires sigmoid colectomy for persisting  diverticulitis. Please see the orders.      Althea Grimmer. Luther Parody, M.D.  Electronically Signed     PJS/MEDQ  D:  05/18/2005  T:  05/18/2005  Job:  [id number]   cc:   Wilson Singer, M.D.  Fax: [phone]   Everardo All. Madilyn Fireman, M.D.  Fax: [phone]   Lebron Conners, M.D.  [address]  [place]  [place] [address]

## 2010-12-15 NOTE — Op Note (Signed)
April [name], April [name] NO.:  [id number]   MEDICAL RECORD NO.:  [id number]          PATIENT TYPE:  OIB   LOCATION:  5703                         FACILITY:  [facility]   PHYSICIAN:  Lebron Conners, M.D.   DATE OF BIRTH:  10/30/1934   DATE OF PROCEDURE:  05/22/2006  DATE OF DISCHARGE:                                 OPERATIVE REPORT   PREOPERATIVE DIAGNOSIS:  Incisional hernia.   POSTOPERATIVE DIAGNOSIS:  Incisional hernia.   OPERATION:  Repair of incisional hernia.   SURGEON:  Lebron Conners, M.D.   ANESTHESIA:  General.   COMPLICATIONS:  None.   SPECIMEN:  None.   BLOOD LOSS:  Minimal.   PROCEDURE:  After the patient was monitored and asleep and had routine  preparation and draping of the abdomen, I excised the old the lower midline  scar.  I extended the incision slightly cephalad because of the position of  the hernia around the umbilicus.  I then dissected the hernia sac away from  the subcutaneous tissues in that area.  I opened the sac and put a finger in  and found a Swiss cheese-type hernia defect, with one sizable defect right  at the pubis.  I developed skin and subcutaneous flaps laterally for 4-5 cm  in all directions and going well past the upper end of the hernia.  There  was good fascia at the pubis, and there was good fascia cephalad.  I closed  the entire hernia defect with a running #1 PDS suture, making sure not to  trap any viscera with my needle.  I then cut a piece of polypropylene mesh  measuring about 20 x 8 cm and rounded at the ends.  I sewed that on as an  overlay beginning cephalad, using a running-basting 2-0 Prolene suture put  through the anterior layer of fascia and sewing it down in the middle with  several sutures.  It lay nice and flat.  I then copiously irrigated the area  and found hemostasis to be  good.  I tacked the umbilicus down to the midline in a comfortable spot.  I  closed the subcutaneous tissues with running 3-0  Vicryl and closed the skin  with staples after inserting a 19-French Blake drain through a separate stab  incision and cutting it to a necessary length.  It held a good charge.  The  patient tolerated the operation well.      Lebron Conners, M.D.  Electronically Signed     WB/MEDQ  D:  05/22/2006  T:  05/23/2006  Job:  [id number]   cc:   Everardo All. Madilyn Fireman, M.D.  Wilson Singer, M.D.

## 2010-12-15 NOTE — Op Note (Signed)
NAMEJAQUANA, GEIGER                ACCOUNT NO.:  [id number]   MEDICAL RECORD NO.:  [id number]          PATIENT TYPE:  AMB   LOCATION:  ENDO                         FACILITY:  [facility]   PHYSICIAN:  Althea Grimmer. Santogade, M.D.DATE OF BIRTH:  11/21/1934   DATE OF PROCEDURE:  08/23/2004  DATE OF DISCHARGE:                                 OPERATIVE REPORT   PROCEDURE:  Anorectal manometry.   INDICATIONS FOR PROCEDURE:  Fecal incontinence.  Preprocedure exam revealed  normal external and digital rectal exam.  Subjectively, her sphincter  squeezing pressure did seem weak.   DESCRIPTION OF PROCEDURE:  The anorectal manometry probe was inserted into  the rectum and allowed to equilibrate for several minutes.  Withdrawal  pressures were measured at each cm of withdrawal.  The maximal resting anal  sphincter pressure reflective of involuntary internal sphincter was 34.8  mmHg which is moderately below normal.  The catheter was reinserted and  withdrawn as the patient voluntarily squeezed and the maximal average  voluntary contraction pressure reflective of the voluntary external  sphincter was 83.6 mmHg at 1 cm from the anal verge.  This is also a low  pressure but is twice the resting tone.  She was able to maintain a maximum  squeeze for over 25 seconds which is normal.  The sphincter length was at  least 3 cm.  Vector-grams appeared to show fairly symmetric pressures.  The  anorectal inhibitory reflex was present, however, her threshold volume of  sensation was 35 mL of air in the rectal balloon which is abnormally high.  Compliance was normal.  The patient tolerated the procedure 250 mL in the  rectal balloon and felt the urge to defecate at 160 mL.   IMPRESSION:  Mildly weak internal and external sphincter pressures and  abnormal rectal sensation.  She may have some degree of neuropathy.  In  discussing symptoms with the patient, she does admit to some numbness and  tingling of her feet.   RECOMMENDATIONS:  Correctable sources of neuropathy could be searched for  such as thyroid or autoimmune problems, Kegel exercises should be  recommended to strengthen her external sphincter, however, unless there is a  reversible neuropathy, there is likely nothing that will affect her  decreased rectal sensation which makes it difficult to control feces.      PJS/MEDQ  D:  08/23/2004  T:  08/23/2004  Job:  [id number]   cc:   Everardo All. Madilyn Fireman, M.D.  [address]  [place]  [place] [address]  Fax: [phone]

## 2010-12-15 NOTE — Op Note (Signed)
NAMEARNETIA, BRONK NO.:  [id number]   MEDICAL RECORD NO.:  [id number]          PATIENT TYPE:  INP   LOCATION:  5714                         FACILITY:  [facility]   PHYSICIAN:  Cherylynn Ridges, M.D.    DATE OF BIRTH:  06/16/1935   DATE OF PROCEDURE:  10/08/2005  DATE OF DISCHARGE:                                 OPERATIVE REPORT   PREOPERATIVE DIAGNOSIS:  Sigmoid diverticulitis.   POSTOPERATIVE DIAGNOSIS:  Sigmoid diverticulitis with small bowel  involvement and involvement of left fallopian tube.   PROCEDURE:  1.  Sigmoid colectomy.  2.  Small bowel resection or enterectomy.  3.  Left salpingectomy.   SURGEON:  Jimmye Norman, M.D.   ASSISTANT:  Leonie Man, M.D.   ANESTHESIA:  General endotracheal anesthesia.   ESTIMATED BLOOD LOSS:  350 mL.   COMPLICATIONS:  None.   CONDITION:  Stable.   FLUIDS REPLACED:  Approximately 2.5 to 3 liters saline.   INDICATIONS FOR PROCEDURE:  The patient is a 76 year old female who has been  stricken with diverticulitis over the past several months who now comes in  with continued abdominal pain for an elective colectomy.   FINDINGS:  The patient had the distal ileum involved in an inflammatory  process of the mid sigmoid colon at the pelvic rim.  There were no adhesions  to any other surrounding structures except for the left fallopian tube which  had to be dissected away from the acute inflammatory process, however, after  removing it from there, there was a lot of inflammatory bleeding, therefore,  we resected the left fallopian tube and left the left ovary in place.   OPERATION:  The patient was taken to the operating room and placed on the  table in supine position.  After an adequate general anesthetic was  administered, she was prepped and draped in the usual sterile manner  exposing the midline of the abdomen.  The incision extended from just the  right of the umbilicus down through the midline to the pubic  crest.  It was  taken down through the midline fascia using electrocautery and once we were  in the peritoneal cavity, we placed the patient in Trendelenburg position.  We could see and feel the inflammatory process in the pelvis.  We explored  the abdomen running the small bowel where there was found to be distal ileum  involved in the inflammatory process.  This was the only abnormality of the  small bowel noted.  The stomach, spleen, gallbladder, and the liver all  appeared to be normal.  We dissected away the distal descending colon and  the proximal sigmoid colon from the line of Toldt to the left peritoneal  gutter.  This allowed [place] to get down to the inflammatory area and, also,  allowed [place] to explore the retroperitoneum in that area where the ureter was  encountered and found to be well away from the inflammatory area.  The  gonadal vessels, however, were intimately involved in the inflammatory  process and were dissected free although we did eventually take the left  fallopian tube leaving the genital vessels in place.  The left fallopian  tube was taken after the colon and the small bowel had been resected but was  ligated at its base with a 2-0 Vicryl tie.  We also used electrocautery and  a suture ligature to control bleeding at the site of the fallopian tube  resection.  Once this was done, we were able to close, however, prior to  doing so and prior to removing the tube we resected the colon.  We dissected  down to the peritoneum overlying the distal sigmoid colon and the proximal  rectum freeing it up from its retroperitoneum.  We took the distal colon and  transected it using a TA-60 stapler.  We placed a Kocher clamp across the  more proximal sigmoid colon and then transected the colon at that point.  We  came across the descending and proximal sigmoid colon using a GIA-75  stapler.  This was done and then we took the mesentery of the colon using 2-  0 silk ties and Kelly  clamps.  Prior to resecting the sigmoid colon, we did  transect a small, about 6-8 inch, segment of small bowel coming across it  with the GIA-75 stapler and then taking the mesentery using 2-0 silk ties  and Kelly clamps.  We closed this mesentery using a figure-of-eight stitch  of 2-0 silk tie and the resulting enterotomy was closed using a TA-60  stapler.  The anastomosis was made with a GIA-75 stapler.  Once the small  bowel anastomosis was done, we did resect the sigmoid colon as described,  then we did a handsewn two layered anastomosis between the distal descending  colon and the proximal rectum using Lambert stitches of 3-0 silk and Connell  stitches of 3-0 Vicryl.  Once the anastomosis was completed, we tacked it  down to the retroperitoneum using a 2-0 silk suture ligature.  Once this was  done, we irrigated after changing our gloves and irrigated with saline  solution.  Approximately 3 liters were used.  Once we had copiously  irrigated, we closed the peritoneum and the fascia using a running #1 PDS  suture.  Once this was done, the skin was closed using stainless steel  staples.  All needle counts, sponge counts, and instrument counts were  correct.      Cherylynn Ridges, M.D.  Electronically Signed     JOW/MEDQ  D:  10/08/2005  T:  10/09/2005  Job:  [id number]

## 2010-12-15 NOTE — Discharge Summary (Signed)
April [name], April [name]                ACCOUNT NO.:  [id number]   MEDICAL RECORD NO.:  [id number]          PATIENT TYPE:  INP   LOCATION:  1507                         FACILITY:  [facility]   PHYSICIAN:  John C. Madilyn Fireman, M.D.    DATE OF BIRTH:  09/25/1934   DATE OF ADMISSION:  05/18/2005  DATE OF DISCHARGE:  05/23/2005                                 DISCHARGE SUMMARY   HISTORY OF PRESENT ILLNESS:  Patient is a 76 year old white female who had  been diagnosed and hospitalized while traveling in [place] with  diverticulitis and followed up with Dr. Madilyn Fireman' office and seemed to be doing  better off of a prolonged course of antibiotics; however, on the day before  admission, she was calling with recurrent abdominal pain.  An abdominal CT  scan was ordered, showing wall thickening and inflammation along the mid  distal sigmoid colon, which was new since the outside study dated April 08, 2005.  There was also a 1.5 cm extra luminal gas collection with  surrounding inflammation.  She also had a white blood cell count of 16,000  and was admitted for diverticulitis with possible diverticular abscess.  For  details, please see admission history and physical.   HOSPITAL COURSE:  The patient was started on Unasyn and given IV fluids and  kept on a clear-liquid diet initially.  Surgical consultation was obtained  with Dr. Orson Slick and medical management was recommended initially.  Clinically, her abdominal pain improved, and she was rather quickly advanced  to a soft mechanical diet.  Her white blood cell count on the second  hospital day was 9900.  On the 4th hospital day, 11,000, and on the 5th  hospital day, 11,600.  After initial improvement, she persisted in having a  gaslike feeling in her abdomen and mild abdominal cramps and mild left-sided  abdominal tenderness, which was improved. She remained afebrile and was  tolerating her diet fairly well.   On the 4th hospital day, her IV antibiotics  were changed to p.o. Augmentin,  875 mg b.i.d.   On the 5th hospital day, her improvement is somewhat plateaued, but it was  felt that she was ready for discharge; however, it was also felt that she  needed fairly close radiologic followup regarding her diverticulitis.  It  was elected to discharge her on October 25 on p.o. antibiotics and to have  her return tomorrow for a follow-up CT scan.  If there is failure of further  clinical improvement or radiologic improvement, she may well require  surgery.   DISCHARGE MEDICATIONS:  The same as on admission plus Augmentin 875 mg  b.i.d.   DISCHARGE DIAGNOSIS:  Diverticulitis.   CONDITION ON DISCHARGE:  Improved.           ______________________________  Everardo All Madilyn Fireman, M.D.     JCH/MEDQ  D:  05/23/2005  T:  05/23/2005  Job:  [id number]   cc:   Wilson Singer, M.D.  Fax: [phone]   Lebron Conners, M.D.  [address], [address]  [place]  [place] [address]

## 2010-12-15 NOTE — Discharge Summary (Signed)
NAMESTARLA, DELLER NO.:  [id number]   MEDICAL RECORD NO.:  [id number]          PATIENT TYPE:  INP   LOCATION:  5714                         FACILITY:  [facility]   PHYSICIAN:  Cherylynn Ridges, M.D.    DATE OF BIRTH:  10/29/1934   DATE OF ADMISSION:  10/08/2005  DATE OF DISCHARGE:  10/14/2005                                 DISCHARGE SUMMARY   DISCHARGE DIAGNOSES:  Diverticulitis.   ADDITIONAL DIAGNOSES:  History of breast cancer reconstruction many years  ago by Dr. Francina Ames.   DISCHARGE MEDICATIONS:  She was discharged home on all her preoperative  medication.  In addition to that she was given Vicodin for pain.   DIET:  Regular.   CONDITION ON DISCHARGE:  Stable.   PRINCIPAL PROCEDURE:  Sigmoid colectomy with primary anastomosis and a  partial small bowel resection and a salpingectomy on the left side.   BRIEF SUMMARY OF HOSPITAL COURSE:  The patient is a 76 year old who had very  symptomatic diverticulitis.  She was brought in after an elective bowel prep  for a sigmoid colectomy.  At the time of surgery was found to have adhesions  to the left fallopian tube and also small bowel, parts of which were  resected of both.  She had sigmoid colectomy along with small bowel  resection and fallopian tube resection.  She did well.  There was no  evidence of malignancy pathologically and after being in the hospital for  several days with minimal fever started to ambulate.  She started taking a  diet on postoperative day #3 and was discharged to home on postoperative day  #6, tolerating a diet well, wound looking well with no evidence of  infection.      Cherylynn Ridges, M.D.  Electronically Signed     JOW/MEDQ  D:  11/14/2005  T:  11/15/2005  Job:  [id number]

## 2010-12-15 NOTE — Op Note (Signed)
   NAME:  April [name], April [name]                          ACCOUNT NO.:  [id number]   MEDICAL RECORD NO.:  [id number]                   PATIENT TYPE:  AMB   LOCATION:  ENDO                                 FACILITY:  [facility]   PHYSICIAN:  John C. Madilyn Fireman, M.D.                 DATE OF BIRTH:  08/26/1934   DATE OF PROCEDURE:  05/24/2003  DATE OF DISCHARGE:                                 OPERATIVE REPORT   PROCEDURE:  Colonoscopy.   INDICATION FOR PROCEDURE:  Colon cancer screening in a 76 year old patient  with no recent screening.   DESCRIPTION OF PROCEDURE:  The patient was placed in the left lateral  decubitus position and placed on the pulse monitor with continuous low-flow  oxygen delivered by nasal cannula.  She was sedated with 75 mcg IV fentanyl  and 6 mg IV Versed.  The Olympus video colonoscope was inserted into the  rectum and advanced to the cecum, confirmed by transillumination of  McBurney's point and visualization of the ileocecal valve and the  appendiceal orifice.  The prep was excellent.  The cecum, ascending,  transverse, descending, and sigmoid colon all appeared normal with no  masses, polyps, diverticula, or other mucosal abnormalities.  The rectum  likewise appeared normal and retroflexed view of the anus revealed no  obvious internal hemorrhoids.  The scope was then withdrawn and the patient  returned to the recovery room in stable condition.  She tolerated the  procedure well, and there were no immediate complications.   IMPRESSION:  Left-sided diverticulosis, otherwise normal study.   PLAN:  Next colon screening by sigmoidoscopy in five years.                                               John C. Madilyn Fireman, M.D.    [facility]  D:  05/24/2003  T:  05/24/2003  Job:  [id number]   cc:   Wilson Singer, M.D.  [address]  [place]  [place] [address]  Fax: [phone]

## 2011-02-01 ENCOUNTER — Encounter: Payer: Self-pay | Admitting: *Deleted

## 2011-02-01 LAB — HM PAP SMEAR: HM Pap smear: NORMAL

## 2011-03-05 ENCOUNTER — Encounter (HOSPITAL_BASED_OUTPATIENT_CLINIC_OR_DEPARTMENT_OTHER): Payer: Medicare Other | Admitting: Oncology

## 2011-03-05 ENCOUNTER — Other Ambulatory Visit: Payer: Self-pay | Admitting: Oncology

## 2011-03-05 DIAGNOSIS — D72829 Elevated white blood cell count, unspecified: Secondary | ICD-10-CM

## 2011-03-05 DIAGNOSIS — D72820 Lymphocytosis (symptomatic): Secondary | ICD-10-CM

## 2011-03-05 DIAGNOSIS — C9110 Chronic lymphocytic leukemia of B-cell type not having achieved remission: Secondary | ICD-10-CM

## 2011-03-05 DIAGNOSIS — M810 Age-related osteoporosis without current pathological fracture: Secondary | ICD-10-CM

## 2011-03-05 LAB — CBC WITH DIFFERENTIAL/PLATELET
BASO%: 0.4 % (ref 0.0–2.0)
Eosinophils Absolute: 0.1 10*3/uL (ref 0.0–0.5)
LYMPH%: 75.9 % — ABNORMAL HIGH (ref 14.0–49.7)
MCHC: 34.5 g/dL (ref 31.5–36.0)
MCV: 101.3 fL — ABNORMAL HIGH (ref 79.5–101.0)
MONO#: 0.9 10*3/uL (ref 0.1–0.9)
MONO%: 4.3 % (ref 0.0–14.0)
NEUT#: 3.8 10*3/uL (ref 1.5–6.5)
Platelets: 178 10*3/uL (ref 145–400)
RBC: 3.66 10*6/uL — ABNORMAL LOW (ref 3.70–5.45)
RDW: 13.8 % (ref 11.2–14.5)
WBC: 20.3 10*3/uL — ABNORMAL HIGH (ref 3.9–10.3)

## 2011-03-05 LAB — MORPHOLOGY: RBC Comments: NORMAL

## 2011-03-26 ENCOUNTER — Encounter: Payer: Self-pay | Admitting: Gynecology

## 2011-03-26 ENCOUNTER — Ambulatory Visit (INDEPENDENT_AMBULATORY_CARE_PROVIDER_SITE_OTHER): Payer: Medicare Other | Admitting: Gynecology

## 2011-03-26 VITALS — BP 110/70 | Ht 60.5 in | Wt 107.0 lb

## 2011-03-26 DIAGNOSIS — M810 Age-related osteoporosis without current pathological fracture: Secondary | ICD-10-CM

## 2011-03-26 DIAGNOSIS — N898 Other specified noninflammatory disorders of vagina: Secondary | ICD-10-CM

## 2011-03-26 DIAGNOSIS — L293 Anogenital pruritus, unspecified: Secondary | ICD-10-CM

## 2011-03-26 DIAGNOSIS — B373 Candidiasis of vulva and vagina: Secondary | ICD-10-CM

## 2011-03-26 DIAGNOSIS — N952 Postmenopausal atrophic vaginitis: Secondary | ICD-10-CM

## 2011-03-26 NOTE — Progress Notes (Signed)
April [name] September 01, 1934 [id number]        76 y.o.  for followup. History of osteoporosis DEXA last year showed T score -3.1 stable from prior studies. She had been on Fosamax and we decided for a drug-free holiday. She also has a history of chronic recurrent yeast vulvovaginitis and is doing suppressive therapy with Terazol 7 day cream one applicator every 2 weeks and has remained symptom free she is at her two-week window now asked to be checked. She is status post bilateral mastectomies where no mammograms are recommended by Dr. Francina Ames who performed her surgery.  Past medical history,surgical history, allergies, family history and social history were all reviewed and documented in the EPIC chart. ROS:  Was performed and pertinent positives and negatives are included in the history.  Exam: chaperone present Filed Vitals:   03/26/11 1102  BP: 110/70   General appearance  Normal Skin grossly normal Head/Neck normal with no cervical or supraclavicular adenopathy thyroid normal Lungs  clear Cardiac RR, without RMG Abdominal  soft, nontender, without masses, organomegaly or hernia Chest wall well-healed mastectomy scars no evidence of visual or palpable masses, abnormalities or axillary adenopathy. Pelvic  Ext/BUS/vagina  Atrophic changes wet prep done  Adnexa  Without masses or tenderness    Anus and perineum  normal   Rectovaginal  normal sphincter tone without palpated masses or tenderness.    Assessment/Plan:  76 y.o. female #1 Osteoporosis. Patient was stable history of osteoporosis has been off of bisphosphonates x1 year. We'll plan on repeat DEXA next year at a 2 year interval. We had discussed whether to do a one year interval but ultimately we decided on a two-year interval and she is comfortable with this. #2 Recurrent yeast vulvovaginitis. Wet prep today does show yeast. She is asymptomatic and will continue her on her Terazol 7 day one applicator every 2 weeks  suppression. #3 Atrophic vaginitis. Patient is asymptomatic from this at this point and we'll continue to observe. #4 Health maintenance. Patient had colonoscopy in 07 when she had a partial colectomy. I asked her to followup with her gastroenterologist just to check when she is due for a repeat. No mammograms are done as she has no residual breast tissue per Dr. Francina Ames. We did not do a Pap smear this year. I discussed newer recommendations and she has no history of abnormal Paps in the past with consecutive normal Paps over the past number of years. We'll at this point stop doing Pap smears and she is comfortable with this. No blood work was done today was all done through her primary who follows her for her medical issues.    [name], 11:43 AM 03/26/2011

## 2011-04-04 ENCOUNTER — Ambulatory Visit (INDEPENDENT_AMBULATORY_CARE_PROVIDER_SITE_OTHER): Payer: Medicare Other | Admitting: Internal Medicine

## 2011-04-04 ENCOUNTER — Encounter: Payer: Self-pay | Admitting: Internal Medicine

## 2011-04-04 DIAGNOSIS — J4489 Other specified chronic obstructive pulmonary disease: Secondary | ICD-10-CM

## 2011-04-04 DIAGNOSIS — C9110 Chronic lymphocytic leukemia of B-cell type not having achieved remission: Secondary | ICD-10-CM

## 2011-04-04 DIAGNOSIS — E785 Hyperlipidemia, unspecified: Secondary | ICD-10-CM

## 2011-04-04 DIAGNOSIS — Z136 Encounter for screening for cardiovascular disorders: Secondary | ICD-10-CM

## 2011-04-04 DIAGNOSIS — M810 Age-related osteoporosis without current pathological fracture: Secondary | ICD-10-CM

## 2011-04-04 DIAGNOSIS — Z0000 Encounter for general adult medical examination without abnormal findings: Secondary | ICD-10-CM

## 2011-04-04 DIAGNOSIS — I872 Venous insufficiency (chronic) (peripheral): Secondary | ICD-10-CM

## 2011-04-04 DIAGNOSIS — J449 Chronic obstructive pulmonary disease, unspecified: Secondary | ICD-10-CM

## 2011-04-04 LAB — LIPID PANEL
Cholesterol: 204 mg/dL — ABNORMAL HIGH (ref 0–200)
HDL: 60.6 mg/dL (ref >39.00)

## 2011-04-04 LAB — TSH: TSH: 0.9 u[IU]/mL (ref 0.35–5.50)

## 2011-04-04 NOTE — Progress Notes (Signed)
Subjective:    Patient ID: April [name], female    DOB: 10/06/1934, 76 y.o.   MRN: [id number]  HPI  76 year old patient who is seen today for a preventive health examination. She is followed by gynecology. She's had a recent gynecologic evaluation and does have a history of osteoporosis Fosamax therapy was discontinued approximately one year ago at the time of her last bone density study she is scheduled for a followup bone density study next year. We'll consider alternate medications at that time. 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 She does quite well takes when necessary Advil only. No new concerns or complaints. She has had some laboratory studies done throughout the year by  hematology. GYN perform a recent CBC which was stable.  Problems Prior to Update:  1) Osteoarthritis (ICD-715.90)  2) Venous Insufficiency, Chronic (ICD-459.81)  3) Uri (ICD-465.9)  4) Acute Pharyngitis (ICD-462)  5) COPD (ICD-496)  6) Skin Cancer, Hx of (ICD-V10.83)  7) Osteoporosis (ICD-733.00)  8) Hyperlipidemia (ICD-272.4)  9) Diarrhea, Acute (ICD-787.91)  10) Diverticulitis, Hx of (ICD-V12.79)  11) Family History of Asthma (ICD-V17.5)   Medications Prior to Update:  1) Terconazole 0.4 % Crea (Terconazole) .... As Dir   Allergies:  1) ! Levaquin   Past History:  Past Medical History:  Reviewed history from 01/20/2009 and no changes required.  Emphysema/Chronic Bronchitis  High Cholesterol  Blood transfusion  Diverticulitis, hx of  Hyperlipidemia  history breast cancer  Osteoporosis  Skin cancer, hx of  COPD  chronic venous insufficiency  Osteoarthritis   Past Surgical History:  Nasoseptoplasty 1971  Partial Removal of Vagina 1980 ([place])  Bil Reduction Mammoplasty 1982  Breast Reconstruction 1982  L elbow Reconstruction a 89  Removal of Breast Implants 2001  Appendectomy 1978  Hysterectomy 1978  Mastectomy  Tonsillectomy 1966  colonoscopy 2005 Madilyn Fireman)  L Salipingectomy  2007  Colectomy 2007  Inguinal herniorrhaphy  incisional hernia repair October 2007  resection basal cell cancer left main 2008   Family History:  Reviewed history from 04/08/2007 and no changes required.  Family History of Hodgkins  Family History of Parkinsons  Family History of Arthritis  Family History of Asthma  Family History of Cardiovascular disorder  Father died age 85: Renal failure  Mother died MI age 28 (first MI age 74)-osteoporosis  One brother died age late 33's- Parkinson's diesease  Brother dies Hodgkin's disease mid 73's  Brother died accidental death age 55  Sister died newborn meningitis  One sister age 3: asthma, osteoporosis   Social History:  Reviewed history from 04/08/2007 and no changes required.  Retired  Married  Former Smoker  Alcohol use-yes  Drug use-no  Regular exercise-no  1. Risk factors, based on past  M,S,F history- cardiovascular risk factors include a history of mild dyslipidemia.  2.  Physical activities: Fairly sedentary but no exercise restrictions  3.  Depression/mood: No history of depression or mood disorder  4.  Hearing: No deficits  5.  ADL's: Independent in all aspects of daily living  6.  Fall risk: Low  7.  Home safety: No problems identified  8.  Height weight, and visual acuity;  9.  Counseling: Calcium vitamin D supplements encouraged more regular exercise recommended  10. Lab orders based on risk factors: Will check a lipid profile and TSH  11. Referral : Follow GYN and oncology  12. Care plan: Followup bone density study in one year 13. Cognitive assessment: Alert and oriented with normal affect. No  cognitive dysfunction.      Review of Systems  Constitutional: Negative for fever, appetite change, fatigue and unexpected weight change.  HENT: Negative for hearing loss, ear pain, nosebleeds, congestion, sore throat, mouth sores, trouble swallowing, neck stiffness, dental problem, voice change, sinus  pressure and tinnitus.   Eyes: Negative for photophobia, pain, redness and visual disturbance.  Respiratory: Negative for cough, chest tightness and shortness of breath.   Cardiovascular: Negative for chest pain, palpitations and leg swelling.  Gastrointestinal: Negative for nausea, vomiting, abdominal pain, diarrhea, constipation, blood in stool, abdominal distention and rectal pain.  Genitourinary: Negative for dysuria, urgency, frequency, hematuria, flank pain, vaginal bleeding, vaginal discharge, difficulty urinating, genital sores, vaginal pain, menstrual problem and pelvic pain.  Musculoskeletal: Negative for back pain and arthralgias.  Skin: Negative for rash.  Neurological: Negative for dizziness, syncope, speech difficulty, weakness, light-headedness, numbness and headaches.  Hematological: Negative for adenopathy. Does not bruise/bleed easily.  Psychiatric/Behavioral: Negative for suicidal ideas, behavioral problems, self-injury, dysphoric mood and agitation. The patient is not nervous/anxious.        Objective:   Physical Exam  Constitutional: She is oriented to person, place, and time. She appears well-developed and well-nourished.  HENT:  Head: Normocephalic and atraumatic.  Right Ear: External ear normal.  Left Ear: External ear normal.  Mouth/Throat: Oropharynx is clear and moist.       Few xanthelasma left inner upper lid  Eyes: Conjunctivae and EOM are normal.  Neck: Normal range of motion. Neck supple. No JVD present. No thyromegaly present.  Cardiovascular: Normal rate, regular rhythm, normal heart sounds and intact distal pulses.   No murmur heard. Pulmonary/Chest: Effort normal and breath sounds normal. She has no wheezes. She has no rales.       Bilateral mastectomies  Abdominal: Soft. Bowel sounds are normal. She exhibits no distension and no mass. There is no tenderness. There is no rebound and no guarding.       Lower midline scar  Musculoskeletal: Normal range  of motion. She exhibits no edema and no tenderness.  Neurological: She is alert and oriented to person, place, and time. She has normal reflexes. No cranial nerve deficit. She exhibits normal muscle tone. Coordination normal.  Skin: Skin is warm and dry. No rash noted.  Psychiatric: She has a normal mood and affect. Her behavior is normal.          Assessment & Plan:   Preventive health. Will check TSH and lipid profile Osteoporosis. We'll check a bone density in one year. Continue calcium and vitamin D supplements CLL. Followup oncology

## 2011-04-04 NOTE — Patient Instructions (Signed)
It is important that you exercise regularly, at least 20 minutes 3 to 4 times per week.  If you develop chest pain or shortness of breath seek  medical attention.  Take a calcium supplement, plus [phone] units of vitamin D  Return in one year for follow-up  Followup oncology

## 2011-04-11 ENCOUNTER — Telehealth: Payer: Self-pay

## 2011-04-11 NOTE — Telephone Encounter (Signed)
Requesting lab results

## 2011-04-12 NOTE — Telephone Encounter (Signed)
Please call/notify patient that lab/test/procedure is normal Please call/notify patient that lab/test/procedure is normal

## 2011-04-12 NOTE — Telephone Encounter (Signed)
Spoke with pt - informed of lab results - copy sent to home adress

## 2011-04-30 ENCOUNTER — Telehealth: Payer: Self-pay | Admitting: Internal Medicine

## 2011-04-30 ENCOUNTER — Ambulatory Visit (INDEPENDENT_AMBULATORY_CARE_PROVIDER_SITE_OTHER): Payer: Medicare Other

## 2011-04-30 DIAGNOSIS — Z23 Encounter for immunization: Secondary | ICD-10-CM

## 2011-04-30 NOTE — Telephone Encounter (Signed)
Do you recall seeing anything?

## 2011-04-30 NOTE — Telephone Encounter (Signed)
Pt. left paperwork last week for review and just wants a follow up phone call

## 2011-04-30 NOTE — Telephone Encounter (Signed)
I will try to get caught up today

## 2011-05-01 NOTE — Telephone Encounter (Signed)
Spoke with pt - informed that per dr. Amador Cunas - the dx she has pointed out on avs - is a computer generated and not a dx he has given her. KIK

## 2011-06-04 ENCOUNTER — Other Ambulatory Visit (HOSPITAL_BASED_OUTPATIENT_CLINIC_OR_DEPARTMENT_OTHER): Payer: Medicare Other | Admitting: Lab

## 2011-06-04 ENCOUNTER — Other Ambulatory Visit: Payer: Self-pay | Admitting: Oncology

## 2011-06-04 ENCOUNTER — Other Ambulatory Visit: Payer: Self-pay | Admitting: Gynecology

## 2011-06-04 DIAGNOSIS — D72820 Lymphocytosis (symptomatic): Secondary | ICD-10-CM

## 2011-06-04 DIAGNOSIS — D72829 Elevated white blood cell count, unspecified: Secondary | ICD-10-CM

## 2011-06-04 DIAGNOSIS — C9110 Chronic lymphocytic leukemia of B-cell type not having achieved remission: Secondary | ICD-10-CM

## 2011-06-04 DIAGNOSIS — M810 Age-related osteoporosis without current pathological fracture: Secondary | ICD-10-CM

## 2011-06-04 LAB — COMPREHENSIVE METABOLIC PANEL
ALT: 28 U/L (ref 0–35)
AST: 24 U/L (ref 0–37)
Chloride: 104 mEq/L (ref 96–112)
Creatinine, Ser: 0.68 mg/dL (ref 0.50–1.10)
Sodium: 139 mEq/L (ref 135–145)
Total Bilirubin: 0.4 mg/dL (ref 0.3–1.2)

## 2011-06-04 LAB — MORPHOLOGY: PLT EST: ADEQUATE

## 2011-06-04 LAB — CBC & DIFF AND RETIC
Basophils Absolute: 0.1 10*3/uL (ref 0.0–0.1)
EOS%: 0.9 % (ref 0.0–7.0)
HCT: 39.9 % (ref 34.8–46.6)
HGB: 13.5 g/dL (ref 11.6–15.9)
LYMPH%: 79.2 % — ABNORMAL HIGH (ref 14.0–49.7)
MCH: 33.9 pg (ref 25.1–34.0)
MCV: 99.9 fL (ref 79.5–101.0)
MONO%: 4 % (ref 0.0–14.0)
NEUT%: 15.6 % — ABNORMAL LOW (ref 38.4–76.8)

## 2011-06-08 ENCOUNTER — Telehealth: Payer: Self-pay | Admitting: *Deleted

## 2011-06-08 NOTE — Telephone Encounter (Signed)
Notified pt. Per Dr. Cyndie Chime that labs done 06/04/11 stable.  Will send copy of lab to Dr Amador Cunas electronically if possible

## 2011-06-18 ENCOUNTER — Ambulatory Visit (INDEPENDENT_AMBULATORY_CARE_PROVIDER_SITE_OTHER): Payer: Medicare Other | Admitting: Internal Medicine

## 2011-06-18 ENCOUNTER — Encounter: Payer: Self-pay | Admitting: Internal Medicine

## 2011-06-18 ENCOUNTER — Telehealth: Payer: Self-pay | Admitting: Internal Medicine

## 2011-06-18 DIAGNOSIS — M1990 Unspecified osteoarthritis, unspecified site: Secondary | ICD-10-CM

## 2011-06-18 DIAGNOSIS — J4489 Other specified chronic obstructive pulmonary disease: Secondary | ICD-10-CM

## 2011-06-18 DIAGNOSIS — J449 Chronic obstructive pulmonary disease, unspecified: Secondary | ICD-10-CM

## 2011-06-18 DIAGNOSIS — R109 Unspecified abdominal pain: Secondary | ICD-10-CM

## 2011-06-18 LAB — POCT URINALYSIS DIPSTICK
Bilirubin, UA: NEGATIVE
Blood, UA: NEGATIVE
Ketones, UA: NEGATIVE
pH, UA: 5

## 2011-06-18 MED ORDER — TRAMADOL HCL 50 MG PO TABS: 50.0000 mg | ORAL_TABLET | Freq: Four times a day (QID) | ORAL | Status: DC | PRN

## 2011-06-18 NOTE — Progress Notes (Signed)
  Subjective:    Patient ID: April [name], female    DOB: 03/22/1935, 76 y.o.   MRN: [id number]  HPI 76 year old patient who presents with a three-day history of pain in the right flank area she localized the pain poorly but maybe in the right upper quadrant area as well. She describes the pain as a significant deep ache without real alleviating or aggravating factors. No history of kidney stones. She does have a history of chronic stable CLL dyslipidemia and osteoarthritis. Denies any change in the quality of her urine. No fever or chills.    Review of Systems  Constitutional: Negative.   HENT: Negative for hearing loss, congestion, sore throat, rhinorrhea, dental problem, sinus pressure and tinnitus.   Eyes: Negative for pain, discharge and visual disturbance.  Respiratory: Negative for cough and shortness of breath.   Cardiovascular: Negative for chest pain, palpitations and leg swelling.  Gastrointestinal: Positive for abdominal pain. Negative for nausea, vomiting, diarrhea, constipation, blood in stool and abdominal distention.  Genitourinary: Negative for dysuria, urgency, frequency, hematuria, flank pain, vaginal bleeding, vaginal discharge, difficulty urinating, vaginal pain and pelvic pain.  Musculoskeletal: Positive for back pain. Negative for joint swelling, arthralgias and gait problem.  Skin: Negative for rash.  Neurological: Negative for dizziness, syncope, speech difficulty, weakness, numbness and headaches.  Hematological: Negative for adenopathy.  Psychiatric/Behavioral: Negative for behavioral problems, dysphoric mood and agitation. The patient is not nervous/anxious.        Objective:   Physical Exam  Constitutional: She is oriented to person, place, and time. She appears well-developed and well-nourished. No distress.  HENT:  Head: Normocephalic.  Right Ear: External ear normal.  Left Ear: External ear normal.  Mouth/Throat: Oropharynx is clear and moist.  Eyes:  Conjunctivae and EOM are normal. Pupils are equal, round, and reactive to light.  Neck: Normal range of motion. Neck supple. No thyromegaly present.  Cardiovascular: Normal rate, regular rhythm, normal heart sounds and intact distal pulses.   Pulmonary/Chest: Effort normal and breath sounds normal.       Oxygen saturation 97%  Abdominal: Soft. Bowel sounds are normal. She exhibits no distension and no mass. There is no tenderness. There is no rebound and no guarding.       No CVA tenderness  Musculoskeletal: Normal range of motion.  Lymphadenopathy:    She has no cervical adenopathy.  Neurological: She is alert and oriented to person, place, and time.  Skin: Skin is warm and dry. No rash noted.  Psychiatric: She has a normal mood and affect. Her behavior is normal.          Assessment & Plan:   Right upper quadrant and right flank pain unclear etiology. We'll check a UA. Will treat symptomatically and observe at this time she seems comfortable at present her exam is nonrevealing; if symptoms persist may consider a CT urogram or least a abdominal ultrasound.

## 2011-06-18 NOTE — Progress Notes (Signed)
Addended by: Duard Brady I on: 06/18/2011 05:01 PM   Modules accepted: Orders

## 2011-06-18 NOTE — Patient Instructions (Signed)
Drink as much fluid as you  can tolerate over the next few days  Take pain medications as directed  Call or return to clinic prn if these symptoms worsen or fail to improve as anticipated.

## 2011-06-18 NOTE — Telephone Encounter (Signed)
Pt is scheduled for today at 3:30 she said she has a bad pain in her side in her rib area, pt would like to be worked in sooner today

## 2011-06-18 NOTE — Telephone Encounter (Signed)
No avilb appts at this time - will call is any cx - pt states she is away from office

## 2011-06-28 ENCOUNTER — Other Ambulatory Visit: Payer: Medicare Other | Admitting: Lab

## 2011-06-28 ENCOUNTER — Ambulatory Visit: Payer: Medicare Other | Admitting: Oncology

## 2011-08-13 ENCOUNTER — Ambulatory Visit: Payer: Medicare Other | Admitting: Oncology

## 2011-10-09 ENCOUNTER — Ambulatory Visit (INDEPENDENT_AMBULATORY_CARE_PROVIDER_SITE_OTHER): Payer: Medicare Other | Admitting: Gynecology

## 2011-10-09 ENCOUNTER — Encounter: Payer: Self-pay | Admitting: Gynecology

## 2011-10-09 VITALS — BP 120/80

## 2011-10-09 DIAGNOSIS — N760 Acute vaginitis: Secondary | ICD-10-CM

## 2011-10-09 DIAGNOSIS — N952 Postmenopausal atrophic vaginitis: Secondary | ICD-10-CM

## 2011-10-09 DIAGNOSIS — B9689 Other specified bacterial agents as the cause of diseases classified elsewhere: Secondary | ICD-10-CM

## 2011-10-09 DIAGNOSIS — N898 Other specified noninflammatory disorders of vagina: Secondary | ICD-10-CM

## 2011-10-09 DIAGNOSIS — A499 Bacterial infection, unspecified: Secondary | ICD-10-CM

## 2011-10-09 LAB — WET PREP FOR TRICH, YEAST, CLUE: Clue Cells Wet Prep HPF POC: NONE SEEN

## 2011-10-09 NOTE — Progress Notes (Signed)
Patient presented to the office today with a complaint of vaginal irritation and a slight green discharge. 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 Last week she applied it and a few days later she started feeling irritated and recently a slight green discharge.  Exam: Bartholin urethra Skene glands: Atrophic changes Vagina: Atrophic changes light green discharge Rectal: No external lesions noted  Wet prep few WBC too numerous to count bacteria few RBCs noted  It appears that patient is recurrent moniliasis and now BV may be attributed to her severe vaginal atrophy. Patient with prior history of breast cancer and lymphoma. We discussed about using a probiotic gel (nonhormonal) such as Luvena or RePhresh which she can apply 2-3 times a week and then she could use the terconazole every 2 weeks for one day as she was doing before. For now we'll treat her for suspected BV with Cleocin vaginal cream to apply each bedtime for 5 nights.

## 2011-10-09 NOTE — Patient Instructions (Signed)
Bacterial Vaginosis Bacterial vaginosis (BV) is a vaginal infection where the normal balance of bacteria in the vagina is disrupted. The normal balance is then replaced by an overgrowth of certain bacteria. There are several different kinds of bacteria that can cause BV. BV is the most common vaginal infection in women of childbearing age. CAUSES   The cause of BV is not fully understood. BV develops when there is an increase or imbalance of harmful bacteria.   Some activities or behaviors can upset the normal balance of bacteria in the vagina and put women at increased risk including:   Having a new sex partner or multiple sex partners.   Douching.   Using an intrauterine device (IUD) for contraception.   It is not clear what role sexual activity plays in the development of BV. However, women that have never had sexual intercourse are rarely infected with BV.  Women do not get BV from toilet seats, bedding, swimming pools or from touching objects around them.  SYMPTOMS   Grey vaginal discharge.   A fish-like odor with discharge, especially after sexual intercourse.   Itching or burning of the vagina and vulva.   Burning or pain with urination.   Some women have no signs or symptoms at all.  DIAGNOSIS  Your caregiver must examine the vagina for signs of BV. Your caregiver will perform lab tests and look at the sample of vaginal fluid through a microscope. They will look for bacteria and abnormal cells (clue cells), a pH test higher than 4.5, and a positive amine test all associated with BV.  RISKS AND COMPLICATIONS   Pelvic inflammatory disease (PID).   Infections following gynecology surgery.   Developing HIV.   Developing herpes virus.  TREATMENT  Sometimes BV will clear up without treatment. However, all women with symptoms of BV should be treated to avoid complications, especially if gynecology surgery is planned. Female partners generally do not need to be treated. However,  BV may spread between female sex partners so treatment is helpful in preventing a recurrence of BV.   BV may be treated with antibiotics. The antibiotics come in either pill or vaginal cream forms. Either can be used with nonpregnant or pregnant women, but the recommended dosages differ. These antibiotics are not harmful to the baby.   BV can recur after treatment. If this happens, a second round of antibiotics will often be prescribed.   Treatment is important for pregnant women. If not treated, BV can cause a premature delivery, especially for a pregnant woman who had a premature birth in the past. All pregnant women who have symptoms of BV should be checked and treated.   For chronic reoccurrence of BV, treatment with a type of prescribed gel vaginally twice a week is helpful.  HOME CARE INSTRUCTIONS   Finish all medication as directed by your caregiver.   Do not have sex until treatment is completed.   Tell your sexual partner that you have a vaginal infection. They should see their caregiver and be treated if they have problems, such as a mild rash or itching.   Practice safe sex. Use condoms. Only have 1 sex partner.  PREVENTION  Basic prevention steps can help reduce the risk of upsetting the natural balance of bacteria in the vagina and developing BV:  Do not have sexual intercourse (be abstinent).   Do not douche.   Use all of the medicine prescribed for treatment of BV, even if the signs and symptoms go away.     Tell your sex partner if you have BV. That way, they can be treated, if needed, to prevent reoccurrence.  SEEK MEDICAL CARE IF:   Your symptoms are not improving after 3 days of treatment.   You have increased discharge, pain, or fever.  MAKE SURE YOU:   Understand these instructions.   Will watch your condition.   Will get help right away if you are not doing well or get worse.  FOR MORE INFORMATION  Division of STD Prevention (DSTDP), Centers for Disease  Control and Prevention: www.cdc.gov/std American Social Health Association (ASHA): www.ashastd.org  Document Released: 07/16/2005 Document Revised: 07/05/2011 Document Reviewed: 01/06/2009 Exit[employer] Patient Information 2012 [employer]. 

## 2011-10-10 MED ORDER — CLINDAMYCIN PHOSPHATE 2 % VA CREA: 1.0000 | TOPICAL_CREAM | Freq: Every day | VAGINAL | Status: AC

## 2011-10-10 NOTE — Progress Notes (Signed)
Addended byMckinley Jewel, [place] Grundman L on: 10/10/2011 10:10 AM   Modules accepted: Orders

## 2011-10-12 ENCOUNTER — Ambulatory Visit (HOSPITAL_BASED_OUTPATIENT_CLINIC_OR_DEPARTMENT_OTHER): Payer: Medicare Other | Admitting: Lab

## 2011-10-12 ENCOUNTER — Ambulatory Visit (HOSPITAL_BASED_OUTPATIENT_CLINIC_OR_DEPARTMENT_OTHER): Payer: Medicare Other | Admitting: Oncology

## 2011-10-12 ENCOUNTER — Telehealth: Payer: Self-pay | Admitting: Oncology

## 2011-10-12 VITALS — BP 179/79 | HR 82 | Temp 97.0°F | Ht 61.0 in | Wt 108.6 lb

## 2011-10-12 DIAGNOSIS — C9110 Chronic lymphocytic leukemia of B-cell type not having achieved remission: Secondary | ICD-10-CM

## 2011-10-12 LAB — CBC WITH DIFFERENTIAL/PLATELET
Basophils Absolute: 0.1 10*3/uL (ref 0.0–0.1)
EOS%: 0.9 % (ref 0.0–7.0)
Eosinophils Absolute: 0.2 10*3/uL (ref 0.0–0.5)
HGB: 14.1 g/dL (ref 11.6–15.9)
NEUT#: 3.7 10*3/uL (ref 1.5–6.5)
RDW: 13.4 % (ref 11.2–14.5)
WBC: 23.8 10*3/uL — ABNORMAL HIGH (ref 3.9–10.3)
lymph#: 18.8 10*3/uL — ABNORMAL HIGH (ref 0.9–3.3)

## 2011-10-12 NOTE — Progress Notes (Signed)
Hematology and Oncology Follow Up Visit  April [name] [id number] 07-03-1935 77 y.o. 10/12/2011 5:54 PM   Principle Diagnosis: Encounter Diagnosis  Name Primary?  . CLL (chronic lymphocytic leukemia) Yes     Interim History:   followup visit for this 77 year old woman with stage 0 chronic lymphocytic leukemia initially referred to our office in August 2010 for asymptomatic leukocytosis with absolute lymphocytosis.  She had no lymphadenopathy.  No organomegaly.  She was not anemic.  White count at presentation was 14,500 with 72% lymphocytes, hemoglobin 14 and platelet count of 159,000.  She initially saw Dr. Welton Flakes.  Additional baseline laboratories showed no evidence for autoimmune hemolysis, slight decrease of IgM immunoglobulin but normal IgA and IgG.  Borderline elevation of beta 2 microglobulin.  She has been followed with observation alone.  Although she does get frequent episodes of bronchitis she has not had any severe infections requiring hospitalization.  Blood counts have been stable over time with some minor fluctuations.  Peak white blood count recorded 21,000 back in February of this year.  Overall, white count runs in the 16,000 to 20,000 since diagnosis.  Hemoglobin and platelets have remained stable. Since last visit she did have another episode of bronchitis in December which lasted for a few weeks and was treated with outpatient antibiotics. She had a dry cough and no fever. No other interim medical problems.   Medications: reviewed  Allergies:  Allergies  Allergen Reactions  . Codeine Nausea Only  . Levofloxacin     Review of Systems: Constitutional:   No constitutional symptoms Respiratory: Cough has resolved Cardiovascular:  No chest pain or palpitations Gastrointestinal: No change in bowel habit Genito-Urinary: No dysuria or frequency Musculoskeletal: No bone pain Neurologic: No headache or change in vision Skin: No rash Remaining ROS negative.  Physical  Exam: Blood pressure 179/79, pulse 82, temperature 97 F (36.1 C), temperature source Oral, height 5\' 1"  (1.549 m), weight 108 lb 9.6 oz (49.261 kg), last menstrual period 03/26/1979. Wt Readings from Last 3 Encounters:  10/12/11 108 lb 9.6 oz (49.261 kg)  06/18/11 110 lb (49.896 kg)  04/04/11 109 lb (49.442 kg)     General appearance: Thin Caucasian woman HENNT: Pharynx no erythema or exudate Lymph nodes: No cervical supraclavicular or axillary lymphadenopathy Breasts: Not examined Lungs: Clear to auscultation resonant to percussion Heart: Regular rhythm no murmur Abdomen: Soft nontender no mass no organomegaly Extremities: No edema no calf tenderness Vascular: No cyanosis Neurologic: Motor strength 5 over 5 reflexes 1+ symmetric Skin: No rash or ecchymosis  Lab Results: Lab Results  Component Value Date   WBC 23.8* 10/12/2011   HGB 14.1 10/12/2011   HCT 42.2 10/12/2011   MCV 101.9* 10/12/2011   PLT 150 10/12/2011  Differential: 15% neutrophils 79% lymphocytes similar to previous CBCs done through this office since August 2012   Chemistry      Component Value Date/Time   NA 139 06/04/2011 1530   NA 140 04/13/2010 1133   K 4.4 06/04/2011 1530   K 4.6 04/13/2010 1133   CL 104 06/04/2011 1530   CL 100 04/13/2010 1133   CO2 28 06/04/2011 1530   CO2 28 04/13/2010 1133   BUN 13 06/04/2011 1530   BUN 11 04/13/2010 1133   CREATININE 0.68 06/04/2011 1530   CREATININE 0.7 04/13/2010 1133      Component Value Date/Time   CALCIUM 9.4 06/04/2011 1530   CALCIUM 9.2 04/13/2010 1133   ALKPHOS 60 06/04/2011 1530   ALKPHOS 74 04/13/2010  1133   AST 24 06/04/2011 1530   AST 31 04/13/2010 1133   ALT 28 06/04/2011 1530   BILITOT 0.4 06/04/2011 1530   BILITOT 0.90 04/13/2010 1133       Impression and Plan: Stage 0 chronic lymphocytic leukemia Counts and differential overall remained stable over time. I will continue observation alone. I will decrease frequency of lab work to every 6 months.   CC:.  Dr. Beverely Low   Levert Feinstein, MD 3/15/20135:54 PM

## 2011-10-12 NOTE — Telephone Encounter (Signed)
Gave pt appt for September lab and MD °

## 2011-11-06 ENCOUNTER — Encounter: Payer: Self-pay | Admitting: Gynecology

## 2011-11-06 ENCOUNTER — Ambulatory Visit (INDEPENDENT_AMBULATORY_CARE_PROVIDER_SITE_OTHER): Payer: Medicare Other | Admitting: Gynecology

## 2011-11-06 DIAGNOSIS — N898 Other specified noninflammatory disorders of vagina: Secondary | ICD-10-CM

## 2011-11-06 LAB — WET PREP FOR TRICH, YEAST, CLUE
Clue Cells Wet Prep HPF POC: NONE SEEN
Trich, Wet Prep: NONE SEEN

## 2011-11-06 MED ORDER — TERCONAZOLE 0.4 % VA CREA: TOPICAL_CREAM | VAGINAL | Status: DC

## 2011-11-06 NOTE — Patient Instructions (Signed)
Use applicator of Terazol cream every 2 weeks. Follow up if vaginal irritation occurs.

## 2011-11-06 NOTE — Progress Notes (Signed)
Patient presents having been recently treated for BV with Cleocin vaginal cream.  Said that her symptoms had disappeared but she wanted to be checked to make sure there's nothing else going on. Has a long history of recurrent yeast infections had been using Terazol 7 cream one applicator every 2 weeks as a suppressive therapy. She has run out of this and needs a refill.  Exam with [name] chaperone present Pelvic external BUS vagina with atrophic changes. No overt discharge noted. Bimanual without masses or tenderness.  Assessment and plan: Wet prep is negative. I refilled her Terazol 7 day cream with 3 total refills to use one applicator every 2 weeks as a suppressive treatment. Assuming this works and she'll see me in the fall when she is due for her annual. If not she'll represent sooner.

## 2012-01-11 ENCOUNTER — Other Ambulatory Visit: Payer: Self-pay | Admitting: Allergy and Immunology

## 2012-01-11 ENCOUNTER — Ambulatory Visit
Admission: RE | Admit: 2012-01-11 | Discharge: 2012-01-11 | Disposition: A | Discharge status: Home / Self Care | Payer: Medicare Other | Source: Ambulatory Visit | Attending: Allergy and Immunology | Admitting: Allergy and Immunology

## 2012-01-11 DIAGNOSIS — R0602 Shortness of breath: Secondary | ICD-10-CM

## 2012-01-21 ENCOUNTER — Ambulatory Visit (INDEPENDENT_AMBULATORY_CARE_PROVIDER_SITE_OTHER): Payer: Medicare Other | Admitting: Internal Medicine

## 2012-01-21 ENCOUNTER — Encounter: Payer: Self-pay | Admitting: Internal Medicine

## 2012-01-21 VITALS — BP 100/76 | Temp 97.8°F | Wt 108.0 lb

## 2012-01-21 DIAGNOSIS — M25519 Pain in unspecified shoulder: Secondary | ICD-10-CM

## 2012-01-21 DIAGNOSIS — M810 Age-related osteoporosis without current pathological fracture: Secondary | ICD-10-CM

## 2012-01-21 DIAGNOSIS — M25512 Pain in left shoulder: Secondary | ICD-10-CM

## 2012-01-21 DIAGNOSIS — M1990 Unspecified osteoarthritis, unspecified site: Secondary | ICD-10-CM

## 2012-01-21 MED ORDER — PROMETHAZINE HCL 12.5 MG PO TABS: 12.5000 mg | ORAL_TABLET | Freq: Three times a day (TID) | ORAL | Status: DC | PRN

## 2012-01-21 MED ORDER — HYDROCODONE-ACETAMINOPHEN 5-500 MG PO TABS: 1.0000 | ORAL_TABLET | Freq: Three times a day (TID) | ORAL | Status: AC | PRN

## 2012-01-21 MED ORDER — METHYLPREDNISOLONE ACETATE 40 MG/ML IJ SUSP
40.0000 mg | Freq: Once | INTRAMUSCULAR | Status: AC
  Administered 2012-01-21: 40 mg via INTRAMUSCULAR

## 2012-01-21 MED ORDER — CYCLOBENZAPRINE HCL 5 MG PO TABS: 5.0000 mg | ORAL_TABLET | Freq: Three times a day (TID) | ORAL | Status: DC | PRN

## 2012-01-21 NOTE — Progress Notes (Signed)
  Subjective:    Patient ID: April [name], female    DOB: 05/02/1935, 77 y.o.   MRN: [id number]  HPI  77 year old patient who presents today with a chief complaint of neck shoulder and arm pain. The neck pain developed 7 days ago and resolved 2 days later. 6 days ago she completed a modest dose oral prednisone Dosepak for allergy related symptoms. For the past 4 days she has had fairly constant achiness in the left shoulder and arm area. Symptoms do not seem to be aggravated by movement of the head neck or shoulder. Denies any weakness involving the left arm or hand. She does have a history of osteoarthritis but no similar neck shoulder or arm discomfort.    Review of Systems  Constitutional: Negative.   HENT: Negative for hearing loss, congestion, sore throat, rhinorrhea, dental problem, sinus pressure and tinnitus.   Eyes: Negative for pain, discharge and visual disturbance.  Respiratory: Negative for cough and shortness of breath.   Cardiovascular: Negative for chest pain, palpitations and leg swelling.  Gastrointestinal: Negative for nausea, vomiting, abdominal pain, diarrhea, constipation, blood in stool and abdominal distention.  Genitourinary: Negative for dysuria, urgency, frequency, hematuria, flank pain, vaginal bleeding, vaginal discharge, difficulty urinating, vaginal pain and pelvic pain.  Musculoskeletal: Positive for arthralgias. Negative for joint swelling and gait problem.  Skin: Negative for rash.  Neurological: Negative for dizziness, syncope, speech difficulty, weakness, numbness and headaches.  Hematological: Negative for adenopathy.  Psychiatric/Behavioral: Negative for behavioral problems, dysphoric mood and agitation. The patient is not nervous/anxious.        Objective:   Physical Exam  Constitutional: She appears well-developed and well-nourished. No distress.  Musculoskeletal:       Full range of motion of the head and neck and left shoulder The left trapezius  musculature was quite tight and tense Grip strength normal bilaterally Biceps and triceps reflexes normal bilaterally            Assessment & Plan:   Left shoulder and arm pain-  we'll try Depo-Medrol to the left deltoid. Warm compresses and rest. We'll treat with analgesics and muscle relaxers and clinically observe. We'll consider imaging studies if unimproved. The patient has tolerated hydrocodone intermittently in the past but at times has had some mild nausea

## 2012-01-21 NOTE — Patient Instructions (Signed)
You  may move around, but avoid painful motions and activities.  Apply heat  to the sore area for 15 to 20 minutes 3 or 4 times daily for the next two to 3 days.  Call or return to clinic prn if these symptoms worsen or fail to improve as anticipated.  

## 2012-03-10 ENCOUNTER — Ambulatory Visit (INDEPENDENT_AMBULATORY_CARE_PROVIDER_SITE_OTHER): Payer: Medicare Other | Admitting: Gynecology

## 2012-03-10 ENCOUNTER — Encounter: Payer: Self-pay | Admitting: Gynecology

## 2012-03-10 DIAGNOSIS — N949 Unspecified condition associated with female genital organs and menstrual cycle: Secondary | ICD-10-CM

## 2012-03-10 DIAGNOSIS — N760 Acute vaginitis: Secondary | ICD-10-CM

## 2012-03-10 DIAGNOSIS — A499 Bacterial infection, unspecified: Secondary | ICD-10-CM

## 2012-03-10 MED ORDER — METRONIDAZOLE 0.75 % VA GEL: 1.0000 | Freq: Two times a day (BID) | VAGINAL | Status: AC

## 2012-03-10 NOTE — Progress Notes (Signed)
Patient presents with vaginal irritation and itching. She does have a long history of chronic vaginitis sometimes be the and sometimes yeast. She's on a chronic yeast suppression of Terazol cream that she uses one applicator every 2 weeks. No fever chills urinary symptoms diarrhea constipation or other constitutional symptoms.   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 Slight white discharge. Bimanual without masses or tenderness.  Assessment and plan: Symptoms and wet prep suggestive of BV. We'll treat with Metrogel twice a day x5 days. She has an appointment to see me regardless in several weeks and will follow up for this.

## 2012-03-10 NOTE — Patient Instructions (Signed)
Use vaginal cream 2 times daily. Follow up at scheduled appointment.

## 2012-03-26 ENCOUNTER — Ambulatory Visit (INDEPENDENT_AMBULATORY_CARE_PROVIDER_SITE_OTHER): Payer: Medicare Other | Admitting: Gynecology

## 2012-03-26 ENCOUNTER — Encounter: Payer: Self-pay | Admitting: Gynecology

## 2012-03-26 VITALS — BP 98/60 | Ht 60.5 in | Wt 109.0 lb

## 2012-03-26 DIAGNOSIS — B3731 Acute candidiasis of vulva and vagina: Secondary | ICD-10-CM

## 2012-03-26 DIAGNOSIS — M810 Age-related osteoporosis without current pathological fracture: Secondary | ICD-10-CM

## 2012-03-26 DIAGNOSIS — N952 Postmenopausal atrophic vaginitis: Secondary | ICD-10-CM

## 2012-03-26 DIAGNOSIS — B373 Candidiasis of vulva and vagina: Secondary | ICD-10-CM

## 2012-03-26 NOTE — Progress Notes (Signed)
April [name] 08/26/1934 [id number]        77 y.o.  G0P0 for follow up exam.  Several issues noted below  Past medical history,surgical history, medications, allergies, family history and social history were all reviewed and documented in the EPIC chart. ROS:  Was performed and pertinent positives and negatives are included in the history.  Exam: April [name] assistant Filed Vitals:   03/26/12 1105  BP: 98/60  Height: 5' 0.5" (1.537 m)  Weight: 109 lb (49.442 kg)   General appearance  Normal Skin grossly normal Head/Neck normal with no cervical or supraclavicular adenopathy thyroid normal Lungs  clear Cardiac RR, without RMG Abdominal  soft, nontender, without masses, organomegaly or hernia Chest wall status post mastectomy bilaterally. Well-healed scars. No masses, skin defects or axillary adenopathy. Pelvic  Ext/BUS/vagina  normal with atrophic changes  Adnexa  Without masses or tenderness    Anus and perineum  normal   Rectovaginal  normal sphincter tone without palpated masses or tenderness.    Assessment/Plan:  77 y.o. G0P0 female.   1. History of recurrent vaginitis. Both yeast and BV. Was recently treated for BV and said symptoms have improved. Using Terazol cream every other week as a suppressive yeast treatment. We'll continue to monitor and represent if she has any recurrent vaginitis symptoms. 2. Atrophic vaginitis. We'll continue to monitor. Not overly bothersome but from a intercourse/dryness issue. 3. Breast health. Status post mastectomies bilaterally with history of breast cancer in 1982. No mammography is needed per Dr. Francina [name]. She had been on ERT in the past but this has been discontinued. Chest wall axillary exam today is normal. 4. Osteoporosis. Has been on bisphosphonates over the years in the past. Stopped 2 years ago for drug-free holiday. Repeat DEXA now at a two-year interval. Most recent DEXA 05/03/2010 showing T score -3.1. 5. Colonoscopy. Patient had 6 or  7 years ago. Was told she did not need any further colonoscopies. I suggested she recheck with them just to make sure. 6. Pap smear. History of hysterectomy in the past for benign indications. Last Pap smear 2011. Numerous normal records in her chart. Plan no further screening per current screening guidelines. 7. Health maintenance. No blood work done today as it's all done through her primary physician's office. Follow up one year sooner if needed.    April Lords MD, 11:28 AM 03/26/2012

## 2012-03-26 NOTE — Patient Instructions (Signed)
Used Terazol cream every other week as a suppressive treatment. Schedule bone density as discussed.  Bone Densitometry Bone densitometry is a special X-ray that measures your bone density and can be used to help predict your risk of bone fractures. This test is used to determine bone mineral content and density to diagnose osteoporosis. Osteoporosis is the loss of bone that may cause the bone to become weak. Osteoporosis commonly occurs in women entering menopause. However, it may be found in men and in people with other diseases. PREPARATION FOR TEST No preparation necessary. WHO SHOULD BE TESTED?  All women older than 63.   Postmenopausal women (50 to 20) with risk factors for osteoporosis.   People with a previous fracture caused by normal activities.   People with a small body frame (less than 127 poundsor a body mass index [BMI] of less than 21).   People who have a parent with a hip fracture or history of osteoporosis.   People who smoke.   People who have rheumatoid arthritis.   Anyone who engages in excessive alcohol use (more than 3 drinks most days).   Women who experience early menopause.  WHEN SHOULD YOU BE RETESTED? Current guidelines suggest that you should wait at least 2 years before doing a bone density test again if your first test was normal.Recent studies indicated that women with normal bone density may be able to wait a few years before needing to repeat a bone density test. You should discuss this with your caregiver.  NORMAL FINDINGS   Normal: less than standard deviation below normal (greater than -1).   Osteopenia: 1 to 2.5 standard deviations below normal (-1 to -2.5).   Osteoporosis: greater than 2.5 standard deviations below normal (less than -2.5).  Test results are reported as a "T score" and a "Z score."The T score is a number that compares your bone density with the bone density of healthy, young women.The Z score is a number that compares your  bone density with the scores of women who are the same age, gender, and race.  Ranges for normal findings may vary among different laboratories and hospitals. You should always check with your doctor after having lab work or other tests done to discuss the meaning of your test results and whether your values are considered within normal limits. MEANING OF TEST  Your caregiver will go over the test results with you and discuss the importance and meaning of your results, as well as treatment options and the need for additional tests if necessary. OBTAINING THE TEST RESULTS It is your responsibility to obtain your test results. Ask the lab or department performing the test when and how you will get your results. Document Released: 08/07/2004 Document Revised: 07/05/2011 Document Reviewed: 08/30/2010 [facility] Patient Information 2012 [place], [place].

## 2012-03-27 LAB — URINALYSIS W MICROSCOPIC + REFLEX CULTURE
Bilirubin Urine: NEGATIVE
Casts: NONE SEEN
Crystals: NONE SEEN
Glucose, UA: NEGATIVE mg/dL
Hgb urine dipstick: NEGATIVE
Ketones, ur: NEGATIVE mg/dL
Leukocytes, UA: NEGATIVE
Nitrite: NEGATIVE
Protein, ur: NEGATIVE mg/dL
Specific Gravity, Urine: 1.024 (ref 1.005–1.030)
Squamous Epithelial / HPF: NONE SEEN
Urobilinogen, UA: 1 mg/dL (ref 0.0–1.0)
pH: 5 (ref 5.0–8.0)

## 2012-03-29 LAB — URINE CULTURE: Colony Count: >=100000

## 2012-03-30 ENCOUNTER — Telehealth: Payer: Self-pay | Admitting: Gynecology

## 2012-03-30 MED ORDER — SULFAMETHOXAZOLE-TRIMETHOPRIM 800-160 MG PO TABS: 1.0000 | ORAL_TABLET | Freq: Two times a day (BID) | ORAL | Status: AC

## 2012-03-30 NOTE — Telephone Encounter (Signed)
Tell patient UTI on UA. Treat with Septra DS bid X 3

## 2012-04-01 NOTE — Telephone Encounter (Signed)
Tried to call pt no voicemail is set up.

## 2012-04-03 NOTE — Telephone Encounter (Signed)
Pt informed with the below note. 

## 2012-04-18 ENCOUNTER — Telehealth: Payer: Self-pay | Admitting: Oncology

## 2012-04-18 ENCOUNTER — Ambulatory Visit (HOSPITAL_BASED_OUTPATIENT_CLINIC_OR_DEPARTMENT_OTHER): Payer: Medicare Other | Admitting: Nurse Practitioner

## 2012-04-18 ENCOUNTER — Other Ambulatory Visit (HOSPITAL_BASED_OUTPATIENT_CLINIC_OR_DEPARTMENT_OTHER): Payer: Medicare Other | Admitting: Lab

## 2012-04-18 VITALS — BP 124/78 | HR 75 | Temp 97.9°F | Resp 18 | Ht 60.5 in | Wt 109.6 lb

## 2012-04-18 DIAGNOSIS — C9110 Chronic lymphocytic leukemia of B-cell type not having achieved remission: Secondary | ICD-10-CM

## 2012-04-18 LAB — CBC WITH DIFFERENTIAL/PLATELET
BASO%: 0.1 % (ref 0.0–2.0)
LYMPH%: 81.5 % — ABNORMAL HIGH (ref 14.0–49.7)
MCHC: 33.1 g/dL (ref 31.5–36.0)
MCV: 102.8 fL — ABNORMAL HIGH (ref 79.5–101.0)
MONO#: 1 10*3/uL — ABNORMAL HIGH (ref 0.1–0.9)
MONO%: 3.7 % (ref 0.0–14.0)
Platelets: 147 10*3/uL (ref 145–400)
RBC: 4.04 10*6/uL (ref 3.70–5.45)
RDW: 13.5 % (ref 11.2–14.5)
WBC: 26.1 10*3/uL — ABNORMAL HIGH (ref 3.9–10.3)

## 2012-04-18 LAB — COMPREHENSIVE METABOLIC PANEL (CC13)
ALT: 22 U/L (ref 0–55)
AST: 21 U/L (ref 5–34)
Alkaline Phosphatase: 74 U/L (ref 40–150)
Calcium: 9.5 mg/dL (ref 8.4–10.4)
Chloride: 107 mEq/L (ref 98–107)
Creatinine: 0.8 mg/dL (ref 0.6–1.1)
Potassium: 4.4 mEq/L (ref 3.5–5.1)

## 2012-04-18 LAB — MORPHOLOGY

## 2012-04-18 NOTE — Telephone Encounter (Signed)
Gave pt appt for lab and MD in March 2014

## 2012-04-18 NOTE — Progress Notes (Signed)
OFFICE PROGRESS NOTE  Interval history:  Ms. Guidry is a 77 year old woman with stage 0 CLL. She is followed on an observation approach with routine labs and clinic visits. She is seen today for scheduled followup.  Ms. Baumert reports being treated for bacterial vaginitis on at least 2 occasions over the past 6 months. She has chronic vaginal yeast infections. She has also been treated for a urinary tract infection. She periodically has bronchitis. She has not had to be hospitalized with any infections.  No fevers or sweats. Appetite and weight are stable.   Objective: Blood pressure 124/78, pulse 75, temperature 97.9 F (36.6 C), temperature source Oral, resp. rate 18, height 5' 0.5" (1.537 m), weight 109 lb 9.6 oz (49.714 kg), last menstrual period 03/26/1979.  Oropharynx is without thrush or ulceration. Approximate 2 cm high right anterior cervical lymph node. No other palpable cervical, supraclavicular, axillary or inguinal lymph nodes. Lungs are clear. No wheezes or rales. Regular cardiac rhythm. Abdomen is soft and nontender. No hepatosplenomegaly. Extremities are without edema. Calves are soft and nontender.  Lab Results: Lab Results  Component Value Date   WBC 26.1* 04/18/2012   HGB 13.7 04/18/2012   HCT 41.5 04/18/2012   MCV 102.8* 04/18/2012   PLT 147 04/18/2012    Chemistry:    Chemistry      Component Value Date/Time   NA 140 04/18/2012 1408   NA 139 06/04/2011 1530   NA 140 04/13/2010 1133   K 4.4 04/18/2012 1408   K 4.4 06/04/2011 1530   K 4.6 04/13/2010 1133   CL 107 04/18/2012 1408   CL 104 06/04/2011 1530   CL 100 04/13/2010 1133   CO2 23 04/18/2012 1408   CO2 28 06/04/2011 1530   CO2 28 04/13/2010 1133   BUN 13.0 04/18/2012 1408   BUN 13 06/04/2011 1530   BUN 11 04/13/2010 1133   CREATININE 0.8 04/18/2012 1408   CREATININE 0.68 06/04/2011 1530   CREATININE 0.7 04/13/2010 1133      Component Value Date/Time   CALCIUM 9.5 04/18/2012 1408   CALCIUM 9.4 06/04/2011 1530   CALCIUM 9.2 04/13/2010 1133   ALKPHOS 74 04/18/2012 1408   ALKPHOS 60 06/04/2011 1530   ALKPHOS 74 04/13/2010 1133   AST 21 04/18/2012 1408   AST 24 06/04/2011 1530   AST 31 04/13/2010 1133   ALT 22 04/18/2012 1408   ALT 28 06/04/2011 1530   BILITOT 1.00 04/18/2012 1408   BILITOT 0.4 06/04/2011 1530   BILITOT 0.90 04/13/2010 1133       Studies/Results: No results found.  Medications: I have reviewed the patient's current medications.  Assessment/Plan:  1. Stage 0 CLL.  Disposition-CBC/differential remains stable. We will continue to follow on an observation approach. She will return for a followup visit in 6 months. She will receive the influenza vaccine from her primary physician. She will contact the office prior to her next visit with any problems.  Plan reviewed with Dr. Cyndie Chime.  Lonna Cobb ANP/GNP-BC

## 2012-04-25 ENCOUNTER — Ambulatory Visit (INDEPENDENT_AMBULATORY_CARE_PROVIDER_SITE_OTHER): Payer: Medicare Other

## 2012-04-25 DIAGNOSIS — Z23 Encounter for immunization: Secondary | ICD-10-CM

## 2012-05-06 ENCOUNTER — Ambulatory Visit (INDEPENDENT_AMBULATORY_CARE_PROVIDER_SITE_OTHER): Payer: Medicare Other

## 2012-05-06 DIAGNOSIS — M810 Age-related osteoporosis without current pathological fracture: Secondary | ICD-10-CM

## 2012-05-07 ENCOUNTER — Telehealth: Payer: Self-pay | Admitting: *Deleted

## 2012-05-07 ENCOUNTER — Encounter: Payer: Self-pay | Admitting: Gynecology

## 2012-05-07 NOTE — Telephone Encounter (Signed)
Unable to leave voicemail on home phone.

## 2012-05-07 NOTE — Telephone Encounter (Signed)
Message copied by Aura Camps on Wed May 07, 2012  4:18 PM ------      Message from: Dara Lords      Created: Wed May 07, 2012  3:11 PM       Tell patient I went to discuss her bone density with her. Options would be office visit versus I can talk to her on the phone.

## 2012-05-14 NOTE — Telephone Encounter (Signed)
Left message on pt voicemail to make OV to discuss results

## 2012-05-19 NOTE — Telephone Encounter (Signed)
Appointment 05/22/12.

## 2012-05-22 ENCOUNTER — Encounter: Payer: Self-pay | Admitting: Gynecology

## 2012-05-22 ENCOUNTER — Ambulatory Visit (INDEPENDENT_AMBULATORY_CARE_PROVIDER_SITE_OTHER): Payer: Medicare Other | Admitting: Gynecology

## 2012-05-22 DIAGNOSIS — M810 Age-related osteoporosis without current pathological fracture: Secondary | ICD-10-CM

## 2012-05-22 NOTE — Progress Notes (Signed)
Patient presents to discuss recent DEXA. T score -3.3. Statistically significant decline at right hip overall stable at left hip and spine. Had been on bisphosphate for a number of years previously and then has been on a drug-free holiday for 2 years. I think given the total picture treatment at this point is warranted. I reviewed with her although does not appear to be statistically significant loss at spine and left hip she certainly is at increased risk of fracture regardless and that current treatment may help to decrease fracture risk if she falls. Options to include bisphosphate both oral as well as IV, Prolia, Forteo, Evista/ERT/calcitonin. Given her prior bisphosphate treatment I think she would be a good candidate for Prolia. 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 She does see Dr. Riley Churches and a master to check with him to see if he has any contraindications to the use of Prolia. If not then we'll treat her and then repeat her DEXA in several years.

## 2012-05-22 NOTE — Patient Instructions (Signed)
We will follow up and discuss Prolia treatment.  If you do not hear from my office within several weeks costs.

## 2012-05-28 ENCOUNTER — Other Ambulatory Visit: Payer: Self-pay | Admitting: Gynecology

## 2012-05-29 ENCOUNTER — Telehealth: Payer: Self-pay | Admitting: *Deleted

## 2012-05-29 NOTE — Telephone Encounter (Signed)
Dr Audie Box asked me to call April [name] to let her know that Dr Marlena Clipper said it was ok for her to proceed with Prolia. I advised April [name] this and told her I would request insurance benefits for Prolia and let her know when I return from vacation on Nov 11. Kw

## 2012-06-13 ENCOUNTER — Ambulatory Visit: Payer: Medicare Other

## 2012-06-13 NOTE — Telephone Encounter (Signed)
Pt was informed that her insurance does cover the Prolia. Medicare and then the secondary insurance will pick up what Medicare doesn't pay. Her secondary insurance [employer] required prior authorization which was approved for the year 2013. Pt will come Monday for the injection. KW

## 2012-06-16 ENCOUNTER — Ambulatory Visit (INDEPENDENT_AMBULATORY_CARE_PROVIDER_SITE_OTHER): Payer: Medicare Other | Admitting: Gynecology

## 2012-06-16 ENCOUNTER — Encounter: Payer: Self-pay | Admitting: Gynecology

## 2012-06-16 DIAGNOSIS — N899 Noninflammatory disorder of vagina, unspecified: Secondary | ICD-10-CM

## 2012-06-16 DIAGNOSIS — N898 Other specified noninflammatory disorders of vagina: Secondary | ICD-10-CM

## 2012-06-16 DIAGNOSIS — R309 Painful micturition, unspecified: Secondary | ICD-10-CM

## 2012-06-16 DIAGNOSIS — R300 Dysuria: Secondary | ICD-10-CM

## 2012-06-16 DIAGNOSIS — N952 Postmenopausal atrophic vaginitis: Secondary | ICD-10-CM

## 2012-06-16 LAB — URINALYSIS W MICROSCOPIC + REFLEX CULTURE
Bilirubin Urine: NEGATIVE
Glucose, UA: NEGATIVE mg/dL
Ketones, ur: NEGATIVE mg/dL
Specific Gravity, Urine: 1.01 (ref 1.005–1.030)
Urobilinogen, UA: 0.2 mg/dL (ref 0.0–1.0)

## 2012-06-16 LAB — WET PREP FOR TRICH, YEAST, CLUE
Clue Cells Wet Prep HPF POC: NONE SEEN
Trich, Wet Prep: NONE SEEN
Yeast Wet Prep HPF POC: NONE SEEN

## 2012-06-16 MED ORDER — NYSTATIN-TRIAMCINOLONE 100000-0.1 UNIT/GM-% EX OINT: TOPICAL_OINTMENT | Freq: Two times a day (BID) | CUTANEOUS | Status: DC

## 2012-06-16 NOTE — Progress Notes (Signed)
Patient presents complaining of vaginal irritation and stinging with urination. Also having some lower abdominal cramping and discomfort. Regular bowel movements with last bowel movement yesterday. No fever chills low back pain frequency bladder spasm type symptoms. History of recurrent yeast vaginitis and atrophic changes.  She used one application of Terazol cream several days ago and still notes the irritation.  Exam with Fleet Contras Asst. Abdomen soft nontender without masses guarding rebound organomegaly. Pelvic external BUS vagina atrophic changes. Bimanual without masses or tenderness.  Labs Wet prep negative Urinalysis negative  Assessment and plan: Vaginal irritation. Atrophic changes. Options for management reviewed.  We'll treat with Mytrex cream externally twice a day when necessary. If this controls her symptoms we will use this on a regular basis. I think her symptoms are a combination of fragility and atrophic changes as well as a low level intermittent yeast. We've talked about estrogen treatment such as Vagifem but she does not want this at this point and we'll try the Mytrex.

## 2012-06-16 NOTE — Patient Instructions (Signed)
Use Mytrex cream twice daily on the outside of the vagina as needed for irritation. If this does not handle the symptoms follow up for reexamination.

## 2012-06-16 NOTE — Addendum Note (Signed)
Addended by: Leonard Schwartz A on: 06/16/2012 10:45 AM   Modules accepted: Orders

## 2012-07-01 ENCOUNTER — Telehealth: Payer: Self-pay | Admitting: *Deleted

## 2012-07-01 NOTE — Telephone Encounter (Signed)
Lm for pt to call back. She didn't get her Prolia in Nov as planned. KW

## 2012-07-02 NOTE — Telephone Encounter (Signed)
Pt called back she is going to get the Prolia tomorrow. Apt made for 1130 KW

## 2012-07-03 ENCOUNTER — Ambulatory Visit (INDEPENDENT_AMBULATORY_CARE_PROVIDER_SITE_OTHER): Payer: Medicare Other | Admitting: *Deleted

## 2012-07-03 DIAGNOSIS — M810 Age-related osteoporosis without current pathological fracture: Secondary | ICD-10-CM

## 2012-07-03 MED ORDER — DENOSUMAB 60 MG/ML ~~LOC~~ SOLN
60.0000 mg | Freq: Once | SUBCUTANEOUS | Status: AC
  Administered 2012-07-03: 60 mg via SUBCUTANEOUS

## 2012-07-15 ENCOUNTER — Other Ambulatory Visit: Payer: Self-pay

## 2012-07-31 ENCOUNTER — Ambulatory Visit (INDEPENDENT_AMBULATORY_CARE_PROVIDER_SITE_OTHER): Payer: PRIVATE HEALTH INSURANCE | Admitting: Internal Medicine

## 2012-07-31 ENCOUNTER — Encounter: Payer: Self-pay | Admitting: Internal Medicine

## 2012-07-31 VITALS — BP 100/60 | HR 89 | Temp 97.6°F | Resp 18 | Ht 60.0 in | Wt 107.0 lb

## 2012-07-31 DIAGNOSIS — E785 Hyperlipidemia, unspecified: Secondary | ICD-10-CM

## 2012-07-31 DIAGNOSIS — M810 Age-related osteoporosis without current pathological fracture: Secondary | ICD-10-CM

## 2012-07-31 DIAGNOSIS — M1990 Unspecified osteoarthritis, unspecified site: Secondary | ICD-10-CM

## 2012-07-31 DIAGNOSIS — C9110 Chronic lymphocytic leukemia of B-cell type not having achieved remission: Secondary | ICD-10-CM

## 2012-07-31 DIAGNOSIS — I872 Venous insufficiency (chronic) (peripheral): Secondary | ICD-10-CM

## 2012-07-31 DIAGNOSIS — Z0000 Encounter for general adult medical examination without abnormal findings: Secondary | ICD-10-CM

## 2012-07-31 NOTE — Patient Instructions (Signed)
Take a calcium supplement, plus 800-1200 units of vitamin D    It is important that you exercise regularly, at least 20 minutes 3 to 4 times per week.  If you develop chest pain or shortness of breath seek  medical attention.  Return in one year for follow-up   

## 2012-07-31 NOTE — Progress Notes (Signed)
Patient ID: April [name], female   DOB: 1935-07-05, 78 y.o.   MRN: [id number]  Subjective:    Patient ID: April [name], female    DOB: September 18, 1934, 78 y.o.   MRN: [id number]  HPI  30  and a year-old patient who is seen today for a preventive health examination. She is followed by gynecology. She's had a recent gynecologic evaluation and does have a history of osteoporosis Fosamax therapy was discontinued approximately  Two  Years ago at the time of her last bone density  in and she now is on Prolia. 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 She does quite well takes when necessary Advil only. No new concerns or complaints. She has had some laboratory studies done throughout the year by  Hematology.  Problems Prior to Update:  1) Osteoarthritis (ICD-715.90)  2) Venous Insufficiency, Chronic (ICD-459.81)  3) Uri (ICD-465.9)  4) Acute Pharyngitis (ICD-462)  5) COPD (ICD-496)  6) Skin Cancer, Hx of (ICD-V10.83)  7) Osteoporosis (ICD-733.00)  8) Hyperlipidemia (ICD-272.4)  9) Diarrhea, Acute (ICD-787.91)  10) Diverticulitis, Hx of (ICD-V12.79)  11) Family History of Asthma (ICD-V17.5)   Medications Prior to Update:  1) Terconazole 0.4 % Crea (Terconazole) .... As Dir   Allergies:  1) ! Levaquin   Past History:  Past Medical History:  Reviewed history from 01/20/2009 and no changes required.  Emphysema/Chronic Bronchitis  High Cholesterol  Blood transfusion  Diverticulitis, hx of  Hyperlipidemia  history breast cancer  Osteoporosis  Skin cancer, hx of  COPD  chronic venous insufficiency  Osteoarthritis   Past Surgical History:  Nasoseptoplasty 1971  Partial Removal of Vagina 1980 ([place])  Bil Reduction Mammoplasty 1982  Breast Reconstruction 1982  L elbow Reconstruction a 89  Removal of Breast Implants 2001  Appendectomy 1978  Hysterectomy 1978  Mastectomy  Bilateral  Tonsillectomy 1966  colonoscopy 2005 Madilyn Fireman)  L Salipingectomy 2007  Colectomy 2007    Inguinal herniorrhaphy  incisional hernia repair October 2007  resection basal cell cancer left main 2008   Family History:  Reviewed history from 04/08/2007 and no changes required.  Family History of Hodgkins  Family History of Parkinsons  Family History of Arthritis  Family History of Asthma  Family History of Cardiovascular disorder  Father died age 56: Renal failure  Mother died MI age 38 (first MI age 31)-osteoporosis  One brother died age late 93's- Parkinson's diesease  Brother dies Hodgkin's disease mid 46's  Brother died accidental death age 52  Sister died newborn meningitis  One sister age 92: asthma, osteoporosis   Social History:  Reviewed history from 04/08/2007 and no changes required.  Retired  Married  Former Smoker  Alcohol use-yes  Drug use-no  Regular exercise-no  1. Risk factors, based on past  M,S,F history- cardiovascular risk factors include a history of mild dyslipidemia.  2.  Physical activities: Fairly sedentary but no exercise restrictions  3.  Depression/mood: No history of depression or mood disorder  4.  Hearing: No deficits  5.  ADL's: Independent in all aspects of daily living  6.  Fall risk: Low  7.  Home safety: No problems identified  8.  Height weight, and visual acuity;  9.  Counseling: Calcium vitamin D supplements encouraged more regular exercise recommended  10. Lab orders based on risk factors: Will check a lipid profile and TSH  11. Referral : Follow GYN and oncology  12. Care plan: Followup bone density study in one year 13. Cognitive assessment:  Alert and oriented with normal affect. No cognitive dysfunction.  Past Medical History  Diagnosis Date  . 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 Leukemia, chronic lymphoid   . Osteoporosis 05/06/2012    T score -3.3    History   Social History  . Marital Status: Married    Spouse Name: N/A    Number of Children: N/A  . Years of Education: N/A    Occupational History  . Not on file.   Social History Main Topics  . Smoking status: Former Smoker    Types: Cigarettes    Quit date: 03/26/1955  . Smokeless tobacco: Never Used  . Alcohol Use: Yes     Comment: wine  . Drug Use: No  . Sexually Active: No   Other Topics Concern  . Not on file   Social History Narrative  . No narrative on file    Past Surgical History  Procedure Date  . Abdominal hysterectomy 1980  . Mastectomy 1982    BILATERAL  . Breast implants removed 2001  . Partial colectomy     INTESTINAL ABSCESS WITH SALPINGECTOMY  . Umbillical hernia repair   . Reconstruction left elbow     Family History  Problem Relation Age of Onset  . Heart disease Mother   . Cancer Brother     HODGKINS  . Cancer Brother     leukemia    Allergies  Allergen Reactions  . Codeine Nausea Only  . Levofloxacin     Current Outpatient Prescriptions on File Prior to Visit  Medication Sig Dispense Refill  . ibuprofen (ADVIL) 200 MG tablet Take 200 mg by mouth every 6 (six) hours as needed.        . nystatin-triamcinolone ointment (MYCOLOG) Apply topically 2 (two) times daily.  30 g  1  . terconazole (TERAZOL 7) 0.4 % vaginal cream ONE APPLICATOR VAGINALLY EVERY 2 WEEKS  45 g  0    BP 100/60  Pulse 89  Temp 97.6 F (36.4 C) (Oral)  Resp 18  Ht 5' (1.524 m)  Wt 107 lb (48.535 kg)  BMI 20.90 kg/m2  SpO2 97%  LMP 03/26/1979       Review of Systems  Constitutional: Negative for fever, appetite change, fatigue and unexpected weight change.  HENT: Negative for hearing loss, ear pain, nosebleeds, congestion, sore throat, mouth sores, trouble swallowing, neck stiffness, dental problem, voice change, sinus pressure and tinnitus.   Eyes: Negative for photophobia, pain, redness and visual disturbance.  Respiratory: Negative for cough, chest tightness and shortness of breath.   Cardiovascular: Negative for chest pain, palpitations and leg swelling.   Gastrointestinal: Negative for nausea, vomiting, abdominal pain, diarrhea, constipation, blood in stool, abdominal distention and rectal pain.  Genitourinary: Negative for dysuria, urgency, frequency, hematuria, flank pain, vaginal bleeding, vaginal discharge, difficulty urinating, genital sores, vaginal pain, menstrual problem and pelvic pain.  Musculoskeletal: Negative for back pain and arthralgias.  Skin: Negative for rash.  Neurological: Negative for dizziness, syncope, speech difficulty, weakness, light-headedness, numbness and headaches.  Hematological: Negative for adenopathy. Does not bruise/bleed easily.  Psychiatric/Behavioral: Negative for suicidal ideas, behavioral problems, self-injury, dysphoric mood and agitation. The patient is not nervous/anxious.    and  and and and a      Objective:   Physical Exam  Constitutional: She is oriented to person, place, and time. She appears well-developed and well-nourished.  HENT:  Head: Normocephalic and atraumatic.  Right Ear: External ear normal.  Left Ear: External ear normal.  Mouth/Throat: Oropharynx is clear and moist.       Few xanthelasma left inner upper lid  Eyes: Conjunctivae normal and EOM are normal.  Neck: Normal range of motion. Neck supple. No JVD present. No thyromegaly present.  Cardiovascular: Normal rate, regular rhythm, normal heart sounds and intact distal pulses.   No murmur heard. Pulmonary/Chest: Effort normal and breath sounds normal. She has no wheezes. She has no rales.       Bilateral mastectomies  Abdominal: Soft. Bowel sounds are normal. She exhibits no distension and no mass. There is no tenderness. There is no rebound and no guarding.       Lower midline scar  Musculoskeletal: Normal range of motion. She exhibits no edema and no tenderness.  Neurological: She is alert and oriented to person, place, and time. She has normal reflexes. No cranial nerve deficit. She exhibits normal muscle tone. Coordination  normal.  Skin: Skin is warm and dry. No rash noted.  Psychiatric: She has a normal mood and affect. Her behavior is normal.          Assessment & Plan:   Preventive health.  Osteoporosis. Continue calcium and vitamin D supplements continue  Prolia CLL. Followup oncology

## 2012-08-14 ENCOUNTER — Other Ambulatory Visit: Payer: Self-pay | Admitting: Gynecology

## 2012-08-14 NOTE — Telephone Encounter (Signed)
Patient had yearly 03/26/12.  I see your note from 11/06/11 where Terazol was prescribed for suppressive treatment.

## 2012-09-18 ENCOUNTER — Telehealth: Payer: Self-pay | Admitting: Oncology

## 2012-10-20 ENCOUNTER — Ambulatory Visit: Payer: Medicare Other | Admitting: Oncology

## 2012-10-20 ENCOUNTER — Other Ambulatory Visit: Payer: Medicare Other | Admitting: Lab

## 2012-10-23 ENCOUNTER — Ambulatory Visit (INDEPENDENT_AMBULATORY_CARE_PROVIDER_SITE_OTHER): Payer: PRIVATE HEALTH INSURANCE | Admitting: Internal Medicine

## 2012-10-23 ENCOUNTER — Encounter: Payer: Self-pay | Admitting: Internal Medicine

## 2012-10-23 VITALS — BP 130/80 | HR 74 | Temp 97.5°F | Resp 18 | Wt 108.0 lb

## 2012-10-23 DIAGNOSIS — M79609 Pain in unspecified limb: Secondary | ICD-10-CM

## 2012-10-23 DIAGNOSIS — M4120 Other idiopathic scoliosis, site unspecified: Secondary | ICD-10-CM

## 2012-10-23 DIAGNOSIS — M1990 Unspecified osteoarthritis, unspecified site: Secondary | ICD-10-CM

## 2012-10-23 DIAGNOSIS — M810 Age-related osteoporosis without current pathological fracture: Secondary | ICD-10-CM

## 2012-10-23 DIAGNOSIS — M79602 Pain in left arm: Secondary | ICD-10-CM

## 2012-10-23 MED ORDER — HYDROCODONE-ACETAMINOPHEN 5-300 MG PO TABS: 1.0000 | ORAL_TABLET | Freq: Four times a day (QID) | ORAL | Status: DC | PRN

## 2012-10-23 MED ORDER — METHYLPREDNISOLONE ACETATE 80 MG/ML IJ SUSP
80.0000 mg | Freq: Once | INTRAMUSCULAR | Status: AC
  Administered 2012-10-23: 80 mg via INTRAMUSCULAR

## 2012-10-23 NOTE — Patient Instructions (Signed)
Keep left arm in a sling for the next few days until pain has improved  Call or return to clinic prn if these symptoms worsen or fail to improve as anticipated.

## 2012-10-23 NOTE — Progress Notes (Signed)
Subjective:    Patient ID: April [name], female    DOB: 04/28/1935, 78 y.o.   MRN: [id number]  HPI  78 year old patient who has a history of scoliosis. She presents with a three-day history of left arm pain;  she describes this as a constant ache without alleviating factors.   She states she has had 4-5 similar episodes over the past 3 years. She does have a history of scoliosis noted on chest x-ray in the past. She is left-handed. She has remote history of left elbow reconstructive surgery greater than 20 years ago.  Pain used to last 3 or 4 days.  Past Medical History  Diagnosis Date  . 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 Leukemia, chronic lymphoid   . Osteoporosis 05/06/2012    T score -3.3    History   Social History  . Marital Status: Married    Spouse Name: N/A    Number of Children: N/A  . Years of Education: N/A   Occupational History  . Not on file.   Social History Main Topics  . Smoking status: Former Smoker    Types: Cigarettes    Quit date: 03/26/1955  . Smokeless tobacco: Never Used  . Alcohol Use: Yes     Comment: wine  . Drug Use: No  . Sexually Active: No   Other Topics Concern  . Not on file   Social History Narrative  . No narrative on file    Past Surgical History  Procedure Laterality Date  . Abdominal hysterectomy  1980  . Mastectomy  1982    BILATERAL  . Breast implants removed  2001  . Partial colectomy      INTESTINAL ABSCESS WITH SALPINGECTOMY  . Umbillical hernia repair    . Reconstruction left elbow      Family History  Problem Relation Age of Onset  . Heart disease Mother   . Cancer Brother     HODGKINS  . Cancer Brother     leukemia    Allergies  Allergen Reactions  . Codeine Nausea Only  . Levofloxacin     Current Outpatient Prescriptions on File Prior to Visit  Medication Sig Dispense Refill  . denosumab (PROLIA) 60 MG/ML SOLN injection Inject 60 mg into the skin every 6 (six) months.  Administer in upper arm, thigh, or abdomen      . ibuprofen (ADVIL) 200 MG tablet Take 200 mg by mouth every 6 (six) hours as needed.        . nystatin-triamcinolone ointment (MYCOLOG) Apply topically 2 (two) times daily.  30 g  1  . terconazole (TERAZOL 7) 0.4 % vaginal cream INSERT ONE APPLICATOR VAGINALLY EVERY 2 WEEKS  45 g  1   No current facility-administered medications on file prior to visit.    BP 130/80  Pulse 74  Temp(Src) 97.5 F (36.4 C) (Oral)  Resp 18  Wt 108 lb (48.988 kg)  BMI 21.09 kg/m2  SpO2 98%  LMP 03/26/1979       Review of Systems  Constitutional: Negative.   HENT: Negative for hearing loss, congestion, sore throat, rhinorrhea, dental problem, sinus pressure and tinnitus.   Eyes: Negative for pain, discharge and visual disturbance.  Respiratory: Negative for cough and shortness of breath.   Cardiovascular: Negative for chest pain, palpitations and leg swelling.  Gastrointestinal: Negative for nausea, vomiting, abdominal pain, diarrhea, constipation, blood in stool and abdominal distention.  Genitourinary: Negative for dysuria, urgency, frequency, hematuria,  flank pain, vaginal bleeding, vaginal discharge, difficulty urinating, vaginal pain and pelvic pain.  Musculoskeletal: Negative for joint swelling, arthralgias and gait problem.       Left arm pain  Skin: Negative for rash.  Neurological: Negative for dizziness, syncope, speech difficulty, weakness, numbness and headaches.  Hematological: Negative for adenopathy.  Psychiatric/Behavioral: Negative for behavioral problems, dysphoric mood and agitation. The patient is not nervous/anxious.        Objective:   Physical Exam  Constitutional: She appears well-developed and well-nourished. No distress.  Musculoskeletal:  Full range of motion of the head neck and left shoulder Range of motion did not aggravate the pain  Symmetrical grip strength Reflexes brisk and equal No signs of active synovitis           Assessment & Plan:   Left arm pain. Unclear etiology. Will place in a sling and continue anti-inflammatories. Will refill her analgesic if needed. Will treat with Depo-Medrol 80. We'll call if unimproved.   Osteoarthriti osteoporosiss

## 2012-10-27 ENCOUNTER — Other Ambulatory Visit (HOSPITAL_BASED_OUTPATIENT_CLINIC_OR_DEPARTMENT_OTHER): Payer: PRIVATE HEALTH INSURANCE | Admitting: Lab

## 2012-10-27 ENCOUNTER — Ambulatory Visit (HOSPITAL_BASED_OUTPATIENT_CLINIC_OR_DEPARTMENT_OTHER): Payer: PRIVATE HEALTH INSURANCE | Admitting: Oncology

## 2012-10-27 VITALS — BP 147/69 | HR 79 | Temp 97.5°F | Resp 18 | Ht 60.0 in | Wt 104.9 lb

## 2012-10-27 DIAGNOSIS — C9110 Chronic lymphocytic leukemia of B-cell type not having achieved remission: Secondary | ICD-10-CM

## 2012-10-27 LAB — MORPHOLOGY: RBC Comments: NORMAL

## 2012-10-27 LAB — CBC WITH DIFFERENTIAL/PLATELET
BASO%: 0.1 % (ref 0.0–2.0)
Eosinophils Absolute: 0.1 10*3/uL (ref 0.0–0.5)
HCT: 43.2 % (ref 34.8–46.6)
MCHC: 33.3 g/dL (ref 31.5–36.0)
MONO#: 1.5 10*3/uL — ABNORMAL HIGH (ref 0.1–0.9)
NEUT#: 5.2 10*3/uL (ref 1.5–6.5)
RBC: 4.28 10*6/uL (ref 3.70–5.45)
WBC: 38.7 10*3/uL — ABNORMAL HIGH (ref 3.9–10.3)
lymph#: 32 10*3/uL — ABNORMAL HIGH (ref 0.9–3.3)

## 2012-10-27 LAB — COMPREHENSIVE METABOLIC PANEL (CC13)
ALT: 16 U/L (ref 0–55)
Albumin: 3.9 g/dL (ref 3.5–5.0)
CO2: 27 mEq/L (ref 22–29)
Calcium: 9.3 mg/dL (ref 8.4–10.4)
Chloride: 104 mEq/L (ref 98–107)
Glucose: 104 mg/dl — ABNORMAL HIGH (ref 70–99)
Sodium: 139 mEq/L (ref 136–145)
Total Protein: 7.2 g/dL (ref 6.4–8.3)

## 2012-10-27 LAB — LACTATE DEHYDROGENASE (CC13): LDH: 157 U/L (ref 125–245)

## 2012-10-27 NOTE — Progress Notes (Signed)
Hematology and Oncology Follow Up Visit  April [name] [id number] Sep 21, 1934 78 y.o. 10/27/2012 8:52 PM   Principle Diagnosis: Encounter Diagnoses  Name Primary?  Marland Kitchen LEUKEMIA, LYMPHOCYTIC, CHRONIC Yes  . LEUKEMIA, LYMPHOCYTIC, CHRONIC      Interim History:   Followup visit for this pleasant 78 year old lady with stage 0 chronic lymphocytic leukemia.  She is doing well. She has had no interim medical problems. She has had no infections this winter.  Medications: reviewed  Allergies:  Allergies  Allergen Reactions  . Codeine Nausea Only  . Levofloxacin     Review of Systems: Constitutional:  No constitutional symptoms  Respiratory: No cough or dyspnea Cardiovascular:  No chest pain or palpitations Gastrointestinal: No change in bowel habit Genito-Urinary: No urinary tract symptoms Musculoskeletal: No muscle bone or joint pain Neurologic: No headache or change in vision Skin: No rash or ecchymosis Remaining ROS negative.  Physical Exam: Blood pressure 147/69, pulse 79, temperature 97.5 F (36.4 C), temperature source Oral, resp. rate 18, height 5' (1.524 m), weight 104 lb 14.4 oz (47.582 kg), last menstrual period 03/26/1979. Wt Readings from Last 3 Encounters:  10/27/12 104 lb 14.4 oz (47.582 kg)  10/23/12 108 lb (48.988 kg)  07/31/12 107 lb (48.535 kg)     General appearance: Thin Caucasian woman looks younger than stated age HENNT: Pharynx no erythema exudate or ulcer Lymph nodes: No cervical, supraclavicular, or axillary adenopathy Breasts: Lungs: Clear to auscultation resonant to percussion Heart: Regular rhythm no murmur Abdomen: Soft, nontender, no mass, no splenomegaly Extremities: No edema, no calf tenderness Vascular: No cyanosis Neurologic: No focal deficit Skin: No rash or ecchymosis  Lab Results: Lab Results: White count differential with 83% lymphocytes, 13% neutrophils   Component Value Date   WBC 38.7* 10/27/2012   HGB 14.4 10/27/2012   HCT  43.2 10/27/2012   MCV 101.1* 10/27/2012   PLT 167 10/27/2012     Chemistry      Component Value Date/Time   NA 139 10/27/2012 1209   NA 139 06/04/2011 1530   NA 140 04/13/2010 1133   K 4.5 10/27/2012 1209   K 4.4 06/04/2011 1530   K 4.6 04/13/2010 1133   CL 104 10/27/2012 1209   CL 104 06/04/2011 1530   CL 100 04/13/2010 1133   CO2 27 10/27/2012 1209   CO2 28 06/04/2011 1530   CO2 28 04/13/2010 1133   BUN 15.0 10/27/2012 1209   BUN 13 06/04/2011 1530   BUN 11 04/13/2010 1133   CREATININE 0.8 10/27/2012 1209   CREATININE 0.68 06/04/2011 1530   CREATININE 0.7 04/13/2010 1133      Component Value Date/Time   CALCIUM 9.3 10/27/2012 1209   CALCIUM 9.4 06/04/2011 1530   CALCIUM 9.2 04/13/2010 1133   ALKPHOS 51 10/27/2012 1209   ALKPHOS 60 06/04/2011 1530   ALKPHOS 74 04/13/2010 1133   AST 16 10/27/2012 1209   AST 24 06/04/2011 1530   AST 31 04/13/2010 1133   ALT 16 10/27/2012 1209   ALT 28 06/04/2011 1530   BILITOT 0.86 10/27/2012 1209   BILITOT 0.4 06/04/2011 1530   BILITOT 0.90 04/13/2010 1133       Impression and Plan: Stage 0 chronic lymphocytic leukemia Approximate doubling of her white count over a two-year interval. Stable, normal, hemoglobin and platelet count. Recommendation: Continue observation alone. I am checking blood counts every 3 months with clinical visits every 6 months.   CC:.    Levert Feinstein, MD 3/31/20148:52 PM

## 2012-10-28 ENCOUNTER — Telehealth: Payer: Self-pay | Admitting: Oncology

## 2012-10-28 NOTE — Telephone Encounter (Signed)
Talked to pt and gave her appt for june and September 2014 lab and MD

## 2012-11-10 ENCOUNTER — Encounter: Payer: Self-pay | Admitting: Gynecology

## 2012-12-15 ENCOUNTER — Other Ambulatory Visit: Payer: Self-pay | Admitting: Gynecology

## 2012-12-17 ENCOUNTER — Encounter: Payer: Self-pay | Admitting: Gynecology

## 2012-12-18 ENCOUNTER — Other Ambulatory Visit: Payer: Self-pay | Admitting: *Deleted

## 2012-12-18 MED ORDER — TERCONAZOLE 0.4 % VA CREA: TOPICAL_CREAM | VAGINAL | Status: DC

## 2013-01-13 ENCOUNTER — Telehealth: Payer: Self-pay | Admitting: *Deleted

## 2013-01-13 NOTE — Telephone Encounter (Signed)
Prolia $50 per insurance. Apt 01/14/13 at [facility]

## 2013-01-14 ENCOUNTER — Ambulatory Visit (INDEPENDENT_AMBULATORY_CARE_PROVIDER_SITE_OTHER): Payer: Medicare Other | Admitting: *Deleted

## 2013-01-14 DIAGNOSIS — M810 Age-related osteoporosis without current pathological fracture: Secondary | ICD-10-CM

## 2013-01-14 MED ORDER — DENOSUMAB 60 MG/ML ~~LOC~~ SOLN
60.0000 mg | Freq: Once | SUBCUTANEOUS | Status: AC
  Administered 2013-01-14: 60 mg via SUBCUTANEOUS

## 2013-01-26 ENCOUNTER — Other Ambulatory Visit (HOSPITAL_BASED_OUTPATIENT_CLINIC_OR_DEPARTMENT_OTHER): Payer: PRIVATE HEALTH INSURANCE

## 2013-01-26 DIAGNOSIS — C9110 Chronic lymphocytic leukemia of B-cell type not having achieved remission: Secondary | ICD-10-CM

## 2013-01-26 LAB — CBC WITH DIFFERENTIAL/PLATELET
BASO%: 0.2 % (ref 0.0–2.0)
Eosinophils Absolute: 0.2 10*3/uL (ref 0.0–0.5)
HCT: 43.4 % (ref 34.8–46.6)
MCHC: 33.8 g/dL (ref 31.5–36.0)
MONO#: 0.9 10*3/uL (ref 0.1–0.9)
NEUT#: 4.5 10*3/uL (ref 1.5–6.5)
NEUT%: 13.5 % — ABNORMAL LOW (ref 38.4–76.8)
Platelets: 148 10*3/uL (ref 145–400)
RBC: 4.28 10*6/uL (ref 3.70–5.45)
WBC: 33.5 10*3/uL — ABNORMAL HIGH (ref 3.9–10.3)
lymph#: 27.8 10*3/uL — ABNORMAL HIGH (ref 0.9–3.3)

## 2013-01-28 ENCOUNTER — Telehealth: Payer: Self-pay | Admitting: *Deleted

## 2013-01-28 NOTE — Telephone Encounter (Signed)
Message copied by Sabino Snipes on Wed Jan 28, 2013  4:28 PM ------      Message from: Levert Feinstein      Created: Tue Jan 27, 2013  8:51 PM       Call pt counts stable no change from baseline ------

## 2013-01-28 NOTE — Telephone Encounter (Signed)
Pt notified of lab results per Dr Granfortuna's instructions.  

## 2013-02-03 ENCOUNTER — Ambulatory Visit (INDEPENDENT_AMBULATORY_CARE_PROVIDER_SITE_OTHER): Payer: Medicare Other | Admitting: Gynecology

## 2013-02-03 ENCOUNTER — Encounter: Payer: Self-pay | Admitting: Gynecology

## 2013-02-03 ENCOUNTER — Telehealth: Payer: Self-pay | Admitting: *Deleted

## 2013-02-03 DIAGNOSIS — R51 Headache: Secondary | ICD-10-CM

## 2013-02-03 NOTE — Telephone Encounter (Signed)
Message copied by Aura Camps on Tue Feb 03, 2013 10:46 AM ------      Message from: Dara Lords      Created: Tue Feb 03, 2013  9:49 AM       Arrange neurology appointment with [facility] reference new onset headaches ------

## 2013-02-03 NOTE — Progress Notes (Signed)
Patient presents complaining of several months of headaches. Patient describes left mid throbbing headaches. Onset late in the day. No associated aura or symptoms such as lights flashing or visual changes. No neurologic symptoms such as muscle weakness tingling vision or other changes. She was wondering whether is related to her nystatin cream. Also on Prolia but onset predates her Prolia shot.  Exam HEENT normal. Several beat lateral nystagmus noted on ocular range of motion. Pupils reactive to light and accommodation. No gross evidence of peripheral muscle weakness  Assessment and plan: Headaches cursory neurologic exam normal. Several beat nystagus on lateral eye movement. Recommend neurology evaluation and will refer. Patient agrees to followup for this. Patient also asked for a mental health referral due to some anxiety issues. Name of April [name] given.

## 2013-02-03 NOTE — Patient Instructions (Signed)
Office we'll help arrange neurology appointment. Call [name] for an appointment.

## 2013-02-03 NOTE — Telephone Encounter (Signed)
appt on 04/27/13 @ 9:15 am husband will inform pt with time and date. Notes faxed to Dr. Clarisse Gouge office.

## 2013-02-18 ENCOUNTER — Ambulatory Visit (INDEPENDENT_AMBULATORY_CARE_PROVIDER_SITE_OTHER): Payer: 59 | Admitting: Licensed Clinical Social Worker

## 2013-02-18 DIAGNOSIS — F39 Unspecified mood [affective] disorder: Secondary | ICD-10-CM

## 2013-03-02 ENCOUNTER — Ambulatory Visit (INDEPENDENT_AMBULATORY_CARE_PROVIDER_SITE_OTHER): Payer: 59 | Admitting: Licensed Clinical Social Worker

## 2013-03-02 DIAGNOSIS — F39 Unspecified mood [affective] disorder: Secondary | ICD-10-CM

## 2013-03-04 ENCOUNTER — Other Ambulatory Visit: Payer: Self-pay

## 2013-03-05 ENCOUNTER — Telehealth: Payer: Self-pay | Admitting: Oncology

## 2013-03-05 NOTE — Telephone Encounter (Signed)
returned pt call that she needed to r/s appt...emialed Dr. Reece Agar for closer appt..9.29.14 @ 10:30.Marland KitchenMarland Kitchenpt ok and aware

## 2013-03-27 ENCOUNTER — Ambulatory Visit (INDEPENDENT_AMBULATORY_CARE_PROVIDER_SITE_OTHER): Payer: Medicare Other | Admitting: Gynecology

## 2013-03-27 ENCOUNTER — Encounter: Payer: Self-pay | Admitting: Gynecology

## 2013-03-27 VITALS — BP 120/76 | Ht 61.0 in | Wt 106.0 lb

## 2013-03-27 DIAGNOSIS — M810 Age-related osteoporosis without current pathological fracture: Secondary | ICD-10-CM

## 2013-03-27 DIAGNOSIS — N760 Acute vaginitis: Secondary | ICD-10-CM

## 2013-03-27 DIAGNOSIS — N952 Postmenopausal atrophic vaginitis: Secondary | ICD-10-CM

## 2013-03-27 MED ORDER — NYSTATIN-TRIAMCINOLONE 100000-0.1 UNIT/GM-% EX OINT: TOPICAL_OINTMENT | Freq: Two times a day (BID) | CUTANEOUS | Status: DC

## 2013-03-27 MED ORDER — TERCONAZOLE 0.4 % VA CREA: TOPICAL_CREAM | VAGINAL | Status: DC

## 2013-03-27 NOTE — Patient Instructions (Signed)
Follow up in one year, sooner as needed. 

## 2013-03-27 NOTE — Progress Notes (Signed)
April [name] 04/23/1935 [id number]        78 y.o.  G0P0 for followup exam.  Several issues that below.  Past medical history,surgical history, medications, allergies, family history and social history were all reviewed and documented in the EPIC chart.  ROS:  Performed and pertinent positives and negatives are included in the history, assessment and plan .  Exam: [name] Filed Vitals:   03/27/13 0948  BP: 120/76  Height: 5\' 1"  (1.549 m)  Weight: 106 lb (48.081 kg)   General appearance  Normal Skin grossly normal Head/Neck normal with no cervical or supraclavicular adenopathy thyroid normal Lungs  clear Cardiac RR, without RMG Abdominal  soft, nontender, without masses, organomegaly or hernia Chest wall status post bilateral mastectomies. No masses or adenopathy. Pelvic  Ext/BUS/vagina  atrophic changes   Adnexa  Without masses or tenderness    Anus and perineum  normal   Rectovaginal  normal sphincter tone without palpated masses or tenderness.    Assessment/Plan:  78 y.o. G0P0 female for followup exam, status post TAH BSO..   1. Atrophic vaginitis/Chronic recurrent fungal vaginitis. Patient's done well with terconazole application every 2 weeks and Mycolog external daily. We'll continue with this regimen I refilled her times a year. 2. Osteoporosis. DEXA 04/2012 with T score -3.3. Currently on Prolia doing well x2 doses. We'll continue through next year and repeat DEXA 2 year interval. 3. History of breast cancer status post bilateral mastectomies 1982. NED 4. Pap smear 2011. No Pap smear done today. No history of abnormal Pap smears previously. Status post hysterectomy for benign indications and over the age of 89. We both agree on stop screening and she is comfortable with this. 5. Colonoscopy 10 years ago. Patient reluctant to repeat. Recommended she followup with her oncologist and arrange at their discretion. 6. Health maintenance. The blood work done as it's all  done through her other physician's offices. Followup one year, sooner as needed.  Note: This document was prepared with digital dictation and possible smart phrase technology. Any transcriptional errors that result from this process are unintentional.   [name], 10:10 AM 03/27/2013

## 2013-03-28 LAB — URINALYSIS W MICROSCOPIC + REFLEX CULTURE
Casts: NONE SEEN
Crystals: NONE SEEN
Glucose, UA: NEGATIVE mg/dL
Leukocytes, UA: NEGATIVE
Nitrite: NEGATIVE
Specific Gravity, Urine: 1.013 (ref 1.005–1.030)
pH: 6.5 (ref 5.0–8.0)

## 2013-04-08 ENCOUNTER — Ambulatory Visit (INDEPENDENT_AMBULATORY_CARE_PROVIDER_SITE_OTHER): Payer: 59 | Admitting: Licensed Clinical Social Worker

## 2013-04-08 DIAGNOSIS — F39 Unspecified mood [affective] disorder: Secondary | ICD-10-CM

## 2013-04-20 ENCOUNTER — Other Ambulatory Visit: Payer: PRIVATE HEALTH INSURANCE | Admitting: Lab

## 2013-04-20 ENCOUNTER — Ambulatory Visit: Payer: PRIVATE HEALTH INSURANCE | Admitting: Oncology

## 2013-04-27 ENCOUNTER — Telehealth: Payer: Self-pay | Admitting: Oncology

## 2013-04-27 ENCOUNTER — Ambulatory Visit (HOSPITAL_BASED_OUTPATIENT_CLINIC_OR_DEPARTMENT_OTHER): Payer: PRIVATE HEALTH INSURANCE | Admitting: Oncology

## 2013-04-27 ENCOUNTER — Other Ambulatory Visit (HOSPITAL_BASED_OUTPATIENT_CLINIC_OR_DEPARTMENT_OTHER): Payer: PRIVATE HEALTH INSURANCE | Admitting: Lab

## 2013-04-27 ENCOUNTER — Other Ambulatory Visit: Payer: Self-pay | Admitting: Neurology

## 2013-04-27 VITALS — BP 131/90 | HR 70 | Temp 96.8°F | Resp 19 | Ht 61.0 in | Wt 109.1 lb

## 2013-04-27 DIAGNOSIS — C9110 Chronic lymphocytic leukemia of B-cell type not having achieved remission: Secondary | ICD-10-CM

## 2013-04-27 LAB — COMPREHENSIVE METABOLIC PANEL (CC13)
ALT: 22 U/L (ref 0–55)
AST: 23 U/L (ref 5–34)
Albumin: 3.9 g/dL (ref 3.5–5.0)
CO2: 28 mEq/L (ref 22–29)
Calcium: 9.7 mg/dL (ref 8.4–10.4)
Chloride: 107 mEq/L (ref 98–109)
Potassium: 4.5 mEq/L (ref 3.5–5.1)

## 2013-04-27 LAB — CBC WITH DIFFERENTIAL/PLATELET
BASO%: 0 % (ref 0.0–2.0)
Eosinophils Absolute: 0.2 10*3/uL (ref 0.0–0.5)
HCT: 42.4 % (ref 34.8–46.6)
MCHC: 33.2 g/dL (ref 31.5–36.0)
MONO#: 1 10*3/uL — ABNORMAL HIGH (ref 0.1–0.9)
NEUT#: 3.6 10*3/uL (ref 1.5–6.5)
NEUT%: 8.3 % — ABNORMAL LOW (ref 38.4–76.8)
WBC: 42.8 10*3/uL — ABNORMAL HIGH (ref 3.9–10.3)
lymph#: 38 10*3/uL — ABNORMAL HIGH (ref 0.9–3.3)

## 2013-04-27 LAB — LACTATE DEHYDROGENASE (CC13): LDH: 174 U/L (ref 125–245)

## 2013-04-27 NOTE — Telephone Encounter (Signed)
gave pt apptr for December lab only and MD with lab on March 2015

## 2013-04-27 NOTE — Progress Notes (Signed)
Hematology and Oncology Follow Up Visit  April [name] [id number] 06/03/1935 78 y.o. 04/27/2013 1:21 PM   Principle Diagnosis: Encounter Diagnosis  Name Primary?  Marland Kitchen LEUKEMIA, LYMPHOCYTIC, CHRONIC Yes     Interim History:   Followup visit for this pleasant 78 year old woman with stage 0 chronic lymphocytic leukemia not requiring any specific treatment so far. She has a slow white count doubling time. White count on 04/18/2012 was 26,100 with 40% neutrophils 82% lymphocytes. Blood counts today 04/27/2013 with white count 44,800, 80% neutrophils, 89% lymphocytes. Hemoglobin and platelet counts remained normal at 14.1 and 156,000 respectively.  She's had no interim medical problems. She denies any infections. She will get her flu vaccine through her primary care physician's office this month.  Medications: reviewed  Allergies:  Allergies  Allergen Reactions  . Codeine Nausea Only  . Levofloxacin Rash    Review of Systems: Constitutional:   No fever, weight loss, anorexia HEENT no sore throat Respiratory: No cough or dyspnea Cardiovascular:  No chest pain or palpitations Gastrointestinal: No abdominal pain or change in bowel habit Genito-Urinary: Recent visit to her gynecologist on August 29 and treated for vaginitis. Musculoskeletal: No muscle bone or joint pain Neurologic: No headache or change in vision Skin: No rash or ecchymosis  Remaining ROS negative.    Physical Exam: Blood pressure 131/90, pulse 70, temperature 96.8 F (36 C), temperature source Oral, resp. rate 19, height 5\' 1"  (1.549 m), weight 109 lb 1.6 oz (49.487 kg), last menstrual period 03/26/1979. Wt Readings from Last 3 Encounters:  04/27/13 109 lb 1.6 oz (49.487 kg)  03/27/13 106 lb (48.081 kg)  10/27/12 104 lb 14.4 oz (47.582 kg)     General appearance: Thin Caucasian woman HENNT: Pharynx no erythema, exudate, or ulcer Lymph nodes: No cervical, supraclavicular, or axillary  adenopathy Breasts: Lungs: Clear to auscultation resonant to percussion Heart: Regular rhythm no murmur Abdomen: Soft, nontender, no mass, no organomegaly Extremities: No edema, no calf tenderness Musculoskeletal: No joint deformities GU: Vascular: No carotid bruits, no cyanosis Neurologic: Mental status intact, cranial nerves carcinoma, motor strength 5 over 5, reflexes 1+ symmetric Skin: No rash or ecchymosis  Lab Results: Lab Results  Component Value Date   WBC 42.8* 04/27/2013   HGB 14.1 04/27/2013   HCT 42.4 04/27/2013   MCV 102.6* 04/27/2013   PLT 156 04/27/2013     Chemistry      Component Value Date/Time   NA 143 04/27/2013 0955   NA 139 06/04/2011 1530   NA 140 04/13/2010 1133   K 4.5 04/27/2013 0955   K 4.4 06/04/2011 1530   K 4.6 04/13/2010 1133   CL 104 10/27/2012 1209   CL 104 06/04/2011 1530   CL 100 04/13/2010 1133   CO2 28 04/27/2013 0955   CO2 28 06/04/2011 1530   CO2 28 04/13/2010 1133   BUN 12.8 04/27/2013 0955   BUN 13 06/04/2011 1530   BUN 11 04/13/2010 1133   CREATININE 0.8 04/27/2013 0955   CREATININE 0.68 06/04/2011 1530   CREATININE 0.7 04/13/2010 1133      Component Value Date/Time   CALCIUM 9.7 04/27/2013 0955   CALCIUM 9.4 06/04/2011 1530   CALCIUM 9.2 04/13/2010 1133   ALKPHOS 58 04/27/2013 0955   ALKPHOS 60 06/04/2011 1530   ALKPHOS 74 04/13/2010 1133   AST 23 04/27/2013 0955   AST 24 06/04/2011 1530   AST 31 04/13/2010 1133   ALT 22 04/27/2013 0955   ALT 28 06/04/2011 1530   ALT  40 04/13/2010 1133   BILITOT 0.78 04/27/2013 0955   BILITOT 0.4 06/04/2011 1530   BILITOT 0.90 04/13/2010 1133       Impression: Stage 0 chronic lymphocytic leukemia. Slowly rising white count but normal hemoglobin and platelet count. We again reviewed the indications for initiation of treatment. Unless there is a very rapid white count doubling time, we do not need to begin treatment unless a patient develops anemia or thrombocytopenia. She will get her flu shot to her primary care  physician's office. We'll continue to check blood counts every 3 months with clinical visits every 6 months.    CC:. Dr. Eleonore Chiquito   Levert Feinstein, MD 9/29/20141:21 PM

## 2013-04-28 ENCOUNTER — Telehealth: Payer: Self-pay | Admitting: Internal Medicine

## 2013-04-28 NOTE — Telephone Encounter (Signed)
Pt wants to know if she needs the pneumonia vaccine this year? If so, we are going schedule this week. Pls advise.

## 2013-04-28 NOTE — Telephone Encounter (Signed)
Done kh

## 2013-04-28 NOTE — Telephone Encounter (Signed)
Yes, pt needs Pneumonia vaccine.

## 2013-04-29 ENCOUNTER — Ambulatory Visit (INDEPENDENT_AMBULATORY_CARE_PROVIDER_SITE_OTHER): Payer: PRIVATE HEALTH INSURANCE | Admitting: Internal Medicine

## 2013-04-29 DIAGNOSIS — Z23 Encounter for immunization: Secondary | ICD-10-CM

## 2013-05-04 ENCOUNTER — Ambulatory Visit
Admission: RE | Admit: 2013-05-04 | Discharge: 2013-05-04 | Disposition: A | Discharge status: Home / Self Care | Payer: PRIVATE HEALTH INSURANCE | Source: Ambulatory Visit | Attending: Neurology | Admitting: Neurology

## 2013-07-03 ENCOUNTER — Encounter: Payer: Self-pay | Admitting: Gynecology

## 2013-07-03 ENCOUNTER — Ambulatory Visit (INDEPENDENT_AMBULATORY_CARE_PROVIDER_SITE_OTHER): Payer: Medicare Other | Admitting: Gynecology

## 2013-07-03 DIAGNOSIS — Z79899 Other long term (current) drug therapy: Secondary | ICD-10-CM

## 2013-07-03 MED ORDER — NYSTATIN-TRIAMCINOLONE 100000-0.1 UNIT/GM-% EX OINT: TOPICAL_OINTMENT | Freq: Two times a day (BID) | CUTANEOUS | Status: DC

## 2013-07-03 NOTE — Patient Instructions (Signed)
Follow up with me as needed.

## 2013-07-03 NOTE — Progress Notes (Signed)
The patient presents with multiple questions: 1. Asked about continuing Prolia. She has received 2 shots and due to receive her third. Doing well without side effects. Recommended she continue through next year when we'll repeat her bone density at a 2 year interval December 2015. Patient agrees with the plan I will followup for the Prolia. 2. Saw Dr. Clarisse Gouge about her headaches. Had a negative MRI but was started on baby aspirin due to some aging vascular changes as a preventative. Also started on Neurontin and she notes that her headaches are much better. She has noticed some swelling in her feet and wondered if this was a side effect. Asked her to call Dr. Cherie Ouch office to followup with him and asked him that question. Also to followup with her primary physician if the swelling would continue. Discussed other reasons for swelling to include cardiac renal liver. She also asked about the baby aspirin and I did discuss the increased risk of bleeding and GI issues with this to be alert about. Asked 3. Asked for Mycolog refill. Uses when necessary vulvar itching. I refilled her x2 tubes. Also asked about their consult. Does uses when necessary for itching also but has supply at home only wanted make sure I would refill them when come due. I reassured her that I would.

## 2013-07-06 ENCOUNTER — Telehealth: Payer: Self-pay | Admitting: *Deleted

## 2013-07-06 NOTE — Telephone Encounter (Signed)
Message copied by Aura Camps on Mon Jul 06, 2013 10:45 AM ------      Message from: April [name]      Created: Fri Jul 03, 2013  4:51 PM       Patient asked me about her recent visit with [name]. I do not see where I received a letter in followup from the visit. If you could ask her office to give me in office note copy or a statement about what going on with Mrs. Rockers I would appreciate it. ------

## 2013-07-06 NOTE — Telephone Encounter (Signed)
April [name] called me back and said she saw patient on July 23 for 1st visit and several times after that, but the last time she met with patient as on September 10 because pt was doing well and said she will call April [name] and make appointment if needed. She going to have the staff try to find a letter to fax.

## 2013-07-06 NOTE — Telephone Encounter (Signed)
Tell [name] that we called April [name]'s office and she felt that [name] was doing well and unless she felt she needed to see April Fanning no special followup was needed.

## 2013-07-06 NOTE — Telephone Encounter (Signed)
Left message at # [phone] for office to call me or fax office note regarding patient.

## 2013-07-07 ENCOUNTER — Ambulatory Visit (INDEPENDENT_AMBULATORY_CARE_PROVIDER_SITE_OTHER): Payer: Medicare Other | Admitting: Internal Medicine

## 2013-07-07 ENCOUNTER — Encounter: Payer: Self-pay | Admitting: Internal Medicine

## 2013-07-07 VITALS — BP 140/82 | HR 84 | Temp 97.6°F | Resp 20 | Wt 110.0 lb

## 2013-07-07 DIAGNOSIS — J449 Chronic obstructive pulmonary disease, unspecified: Secondary | ICD-10-CM

## 2013-07-07 DIAGNOSIS — I872 Venous insufficiency (chronic) (peripheral): Secondary | ICD-10-CM

## 2013-07-07 DIAGNOSIS — C9110 Chronic lymphocytic leukemia of B-cell type not having achieved remission: Secondary | ICD-10-CM

## 2013-07-07 NOTE — Progress Notes (Signed)
Subjective:    Patient ID: April [name], female    DOB: 1935-05-15, 78 y.o.   MRN: [id number]  HPI Pre-visit discussion using our clinic review tool. No additional management support is needed unless otherwise documented below in the visit note.  78 year old patient who has a history of chronic venous insufficiency for the past month she has had some mild pedal edema. No significant leg pain. She is followed by gynecology as well as oncology. No new concerns or complaints.  Past Medical History  Diagnosis Date  . Osteoporosis 05/06/2012    T score -3.3  . 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 Leukemia, chronic lymphoid   . Breast CA   . Bronchitis, chronic     History   Social History  . Marital Status: Married    Spouse Name: N/A    Number of Children: N/A  . Years of Education: N/A   Occupational History  . Not on file.   Social History Main Topics  . Smoking status: Former Smoker    Types: Cigarettes    Quit date: 03/26/1955  . Smokeless tobacco: Never Used  . Alcohol Use: 2.5 oz/week    5 drink(s) per week     Comment: wine  . Drug Use: No  . Sexual Activity: No     Comment: HYST   Other Topics Concern  . Not on file   Social History Narrative  . No narrative on file    Past Surgical History  Procedure Laterality Date  . Abdominal hysterectomy  1980  . Mastectomy  1982    BILATERAL  . Breast implants removed  2001  . Partial colectomy      INTESTINAL ABSCESS WITH SALPINGECTOMY  . Umbillical hernia repair    . Reconstruction left elbow    . Oophorectomy      BSO    Family History  Problem Relation Age of Onset  . Heart disease Mother   . Cancer Brother     HODGKINS  . Cancer Brother     leukemia    Allergies  Allergen Reactions  . Codeine Nausea Only  . Levofloxacin Rash    Current Outpatient Prescriptions on File Prior to Visit  Medication Sig Dispense Refill  . aspirin 81 MG tablet Take 81 mg by mouth daily.       . Beclomethasone Dipropionate (QVAR IN) Inhale into the lungs.      Marland Kitchen denosumab (PROLIA) 60 MG/ML SOLN injection Inject 60 mg into the skin every 6 (six) months. Administer in upper arm, thigh, or abdomen      . gabapentin (NEURONTIN) 100 MG capsule Take 100 mg by mouth daily.      Marland Kitchen ibuprofen (ADVIL) 200 MG tablet Take 200 mg by mouth every 6 (six) hours as needed.        . nystatin-triamcinolone ointment (MYCOLOG) Apply topically 2 (two) times daily.  30 g  1  . PROAIR HFA 108 (90 BASE) MCG/ACT inhaler Inhale into the lungs as needed.      Marland Kitchen terconazole (TERAZOL 7) 0.4 % vaginal cream INSERT ONE APPLICATOR VAGINALLY EVERY 2 WEEKS  45 g  2   No current facility-administered medications on file prior to visit.    BP 140/82  Pulse 84  Temp(Src) 97.6 F (36.4 C) (Oral)  Resp 20  Wt 110 lb (49.896 kg)  SpO2 97%  LMP 03/26/1979      Review of Systems  Constitutional: Negative.  HENT: Negative for congestion, dental problem, hearing loss, rhinorrhea, sinus pressure, sore throat and tinnitus.   Eyes: Negative for pain, discharge and visual disturbance.  Respiratory: Negative for cough and shortness of breath.   Cardiovascular: Positive for leg swelling. Negative for chest pain and palpitations.  Gastrointestinal: Negative for nausea, vomiting, abdominal pain, diarrhea, constipation, blood in stool and abdominal distention.  Genitourinary: Negative for dysuria, urgency, frequency, hematuria, flank pain, vaginal bleeding, vaginal discharge, difficulty urinating, vaginal pain and pelvic pain.  Musculoskeletal: Negative for arthralgias, gait problem and joint swelling.  Skin: Negative for rash.  Neurological: Negative for dizziness, syncope, speech difficulty, weakness, numbness and headaches.  Hematological: Negative for adenopathy.  Psychiatric/Behavioral: Negative for behavioral problems, dysphoric mood and agitation. The patient is not nervous/anxious.        Objective:    Physical Exam  Constitutional: She is oriented to person, place, and time. She appears well-developed and well-nourished.  HENT:  Head: Normocephalic.  Right Ear: External ear normal.  Left Ear: External ear normal.  Mouth/Throat: Oropharynx is clear and moist.  Eyes: Conjunctivae and EOM are normal. Pupils are equal, round, and reactive to light.  Neck: Normal range of motion. Neck supple. No thyromegaly present.  Cardiovascular: Normal rate, regular rhythm, normal heart sounds and intact distal pulses.   Pulmonary/Chest: Effort normal and breath sounds normal.  Abdominal: Soft. Bowel sounds are normal. She exhibits no mass. There is no tenderness.  Musculoskeletal: Normal range of motion.  Prominent varicosities of the lower legs left greater than the right. No significant edema at this time  Lymphadenopathy:    She has no cervical adenopathy.  Neurological: She is alert and oriented to person, place, and time.  Skin: Skin is warm and dry. No rash noted.  Psychiatric: She has a normal mood and affect. Her behavior is normal.          Assessment & Plan:   Chronic venous insufficiency. Low salt diet recommended compression stockings also encouraged. She'll consider a vascular surgery referral Dyslipidemia CLL   CPX 6 months

## 2013-07-07 NOTE — Progress Notes (Signed)
   Subjective:    Patient ID: April [name], female    DOB: 1934-11-26, 78 y.o.   MRN: [id number]  HPI  Wt Readings from Last 3 Encounters:  07/07/13 110 lb (49.896 kg)  04/27/13 109 lb 1.6 oz (49.487 kg)  03/27/13 106 lb (48.081 kg)    Review of Systems     Objective:   Physical Exam        Assessment & Plan:

## 2013-07-07 NOTE — Patient Instructions (Signed)
Limit your sodium (Salt) intake  Return in 6 months for follow-up  

## 2013-07-07 NOTE — Telephone Encounter (Signed)
Pt informed with the below note. 

## 2013-07-07 NOTE — Progress Notes (Signed)
Pre-visit discussion using our clinic review tool. No additional management support is needed unless otherwise documented below in the visit note.  

## 2013-07-09 ENCOUNTER — Telehealth: Payer: Self-pay | Admitting: *Deleted

## 2013-07-09 NOTE — Telephone Encounter (Signed)
Pt due for prolia after 07/16/13. Pt is to pay $50 copay for Prolia and no PA needed. KW CMA Apt 12/19 10am

## 2013-07-17 ENCOUNTER — Ambulatory Visit (INDEPENDENT_AMBULATORY_CARE_PROVIDER_SITE_OTHER): Payer: Medicare Other | Admitting: *Deleted

## 2013-07-17 DIAGNOSIS — M810 Age-related osteoporosis without current pathological fracture: Secondary | ICD-10-CM

## 2013-07-17 MED ORDER — DENOSUMAB 60 MG/ML ~~LOC~~ SOLN
60.0000 mg | Freq: Once | SUBCUTANEOUS | Status: AC
  Administered 2013-07-17: 60 mg via SUBCUTANEOUS

## 2013-07-27 ENCOUNTER — Other Ambulatory Visit (HOSPITAL_BASED_OUTPATIENT_CLINIC_OR_DEPARTMENT_OTHER): Payer: Medicare Other

## 2013-07-27 ENCOUNTER — Telehealth: Payer: Self-pay | Admitting: *Deleted

## 2013-07-27 DIAGNOSIS — C9110 Chronic lymphocytic leukemia of B-cell type not having achieved remission: Secondary | ICD-10-CM

## 2013-07-27 LAB — CBC WITH DIFFERENTIAL/PLATELET
Basophils Absolute: 0.1 10*3/uL (ref 0.0–0.1)
EOS%: 0.5 % (ref 0.0–7.0)
MCH: 34 pg (ref 25.1–34.0)
MCHC: 33.3 g/dL (ref 31.5–36.0)
MCV: 102 fL — ABNORMAL HIGH (ref 79.5–101.0)
MONO%: 2.3 % (ref 0.0–14.0)
RBC: 4.12 10*6/uL (ref 3.70–5.45)
RDW: 13.7 % (ref 11.2–14.5)
lymph#: 34.7 10*3/uL — ABNORMAL HIGH (ref 0.9–3.3)

## 2013-07-27 LAB — TECHNOLOGIST REVIEW

## 2013-07-27 NOTE — Telephone Encounter (Signed)
Informed pt of stable WBC compared to previous result per Dr Patsy Lager instructions.

## 2013-07-27 NOTE — Telephone Encounter (Signed)
Message copied by Sabino Snipes on Mon Jul 27, 2013  2:30 PM ------      Message from: Levert Feinstein      Created: Mon Jul 27, 2013  2:08 PM       Call pt:  Lab stable white count compared to previous ------

## 2013-10-07 ENCOUNTER — Other Ambulatory Visit: Payer: Self-pay | Admitting: Gynecology

## 2013-10-07 NOTE — Telephone Encounter (Signed)
Atrophic vaginitis/Chronic recurrent fungal vaginitis. Patient's done well with terconazole application every 2 weeks and Mycolog external daily. We'll continue with this regimen I refilled her times a year

## 2013-10-26 ENCOUNTER — Other Ambulatory Visit (HOSPITAL_BASED_OUTPATIENT_CLINIC_OR_DEPARTMENT_OTHER): Payer: Medicare Other

## 2013-10-26 ENCOUNTER — Ambulatory Visit (HOSPITAL_BASED_OUTPATIENT_CLINIC_OR_DEPARTMENT_OTHER): Payer: Medicare Other | Admitting: Nurse Practitioner

## 2013-10-26 ENCOUNTER — Telehealth: Payer: Self-pay | Admitting: Oncology

## 2013-10-26 VITALS — BP 152/80 | HR 74 | Temp 97.6°F | Resp 18 | Ht 61.0 in | Wt 108.2 lb

## 2013-10-26 DIAGNOSIS — C9110 Chronic lymphocytic leukemia of B-cell type not having achieved remission: Secondary | ICD-10-CM

## 2013-10-26 LAB — CBC WITH DIFFERENTIAL/PLATELET
BASO%: 0.2 % (ref 0.0–2.0)
Basophils Absolute: 0.1 10*3/uL (ref 0.0–0.1)
EOS ABS: 0.2 10*3/uL (ref 0.0–0.5)
EOS%: 0.6 % (ref 0.0–7.0)
HEMATOCRIT: 43.4 % (ref 34.8–46.6)
HEMOGLOBIN: 14.3 g/dL (ref 11.6–15.9)
LYMPH%: 88.1 % — ABNORMAL HIGH (ref 14.0–49.7)
MCH: 33.6 pg (ref 25.1–34.0)
MCHC: 32.9 g/dL (ref 31.5–36.0)
MCV: 101.9 fL — AB (ref 79.5–101.0)
MONO#: 1 10*3/uL — ABNORMAL HIGH (ref 0.1–0.9)
MONO%: 2.6 % (ref 0.0–14.0)
NEUT%: 8.5 % — AB (ref 38.4–76.8)
NEUTROS ABS: 3.4 10*3/uL (ref 1.5–6.5)
PLATELETS: 162 10*3/uL (ref 145–400)
RBC: 4.26 10*6/uL (ref 3.70–5.45)
RDW: 13.4 % (ref 11.2–14.5)
WBC: 39.8 10*3/uL — ABNORMAL HIGH (ref 3.9–10.3)
lymph#: 35.1 10*3/uL — ABNORMAL HIGH (ref 0.9–3.3)
nRBC: 0 % (ref 0–0)

## 2013-10-26 LAB — COMPREHENSIVE METABOLIC PANEL (CC13)
ALBUMIN: 4.1 g/dL (ref 3.5–5.0)
ALT: 19 U/L (ref 0–55)
AST: 21 U/L (ref 5–34)
Alkaline Phosphatase: 56 U/L (ref 40–150)
Anion Gap: 8 mEq/L (ref 3–11)
BUN: 12.7 mg/dL (ref 7.0–26.0)
CO2: 24 mEq/L (ref 22–29)
Calcium: 9.4 mg/dL (ref 8.4–10.4)
Chloride: 109 mEq/L (ref 98–109)
Creatinine: 0.8 mg/dL (ref 0.6–1.1)
GLUCOSE: 93 mg/dL (ref 70–140)
POTASSIUM: 5.1 meq/L (ref 3.5–5.1)
SODIUM: 142 meq/L (ref 136–145)
TOTAL PROTEIN: 7 g/dL (ref 6.4–8.3)
Total Bilirubin: 0.76 mg/dL (ref 0.20–1.20)

## 2013-10-26 LAB — TECHNOLOGIST REVIEW

## 2013-10-26 LAB — LACTATE DEHYDROGENASE (CC13): LDH: 171 U/L (ref 125–245)

## 2013-10-26 MED ORDER — DIPHENHYDRAMINE HCL 25 MG PO CAPS
ORAL_CAPSULE | ORAL | Status: AC
  Filled 2013-10-26: qty 1

## 2013-10-26 MED ORDER — ACETAMINOPHEN 325 MG PO TABS
ORAL_TABLET | ORAL | Status: AC
  Filled 2013-10-26: qty 2

## 2013-10-26 NOTE — Progress Notes (Signed)
  [place] OFFICE PROGRESS NOTE   Diagnosis:  CLL, stage 0.  INTERVAL HISTORY:   April [name] is a 79 year old woman with stage 0 CLL. She has never required treatment. She is seen today for scheduled followup.  She feels well. No interim illnesses or infections. She reports being up-to-date on vaccinations. No fevers or sweats. She has a good appetite. Weight is stable. She denies any enlarged lymph nodes. A right anterior cervical lymph node tends to become enlarged when she has an upper respiratory infection. She denies shortness of breath and cough. No bleeding. No bowel or bladder problems. She thinks she may have pulled a hip muscle.  Objective:  Vital signs in last 24 hours:  Blood pressure 152/80, pulse 74, temperature 97.6 F (36.4 C), temperature source Oral, resp. rate 18, height 5\' 1"  (1.549 m), weight 108 lb 3.2 oz (49.079 kg), last menstrual period 03/26/1979, SpO2 100.00%.    HEENT: Oropharynx is without thrush or ulceration. Lymphatics: Question 1 cm high right anterior cervical lymph node. No other palpable cervical, supraclavicular, axillary or inguinal lymph nodes. Resp: Lungs are clear. No wheezes or rales. Cardio: Regular cardiac rhythm. GI: Abdomen soft and nontender. No organomegaly. Vascular: No leg edema.   Lab Results:  Lab Results  Component Value Date   WBC 39.8* 10/26/2013   HGB 14.3 10/26/2013   HCT 43.4 10/26/2013   MCV 101.9* 10/26/2013   PLT 162 10/26/2013   NEUTROABS 3.4 10/26/2013     Imaging:  No results found.  Medications: I have reviewed the patient's current medications.  Assessment/Plan:  1. CLL, stage 0. She has never required treatment.  Disposition: Blood counts remain stable. We will continue to follow on an observation approach with routine lab and clinic visits. Due to the stability of her blood counts over time we will adjust the lab visit interval from 3 months to 6 months. She is aware that Dr. Beryle Beams  has left this practice. Her care has been transitioned to Dr. Alvy Bimler. She will see Dr. Alvy Bimler when she returns in 6 months.  April [name] understands the importance of remaining up-to-date on vaccinations and seeking evaluation for any signs of infection.   Plan reviewed with Dr. Alvy Bimler.    Ned Card ANP/GNP-BC   10/26/2013  12:51 PM

## 2013-10-26 NOTE — Telephone Encounter (Signed)
GV ADN PRINTED APTP SCHED AND AVS FOR PT FOR sEPT

## 2013-12-18 ENCOUNTER — Encounter: Payer: Self-pay | Admitting: Internal Medicine

## 2013-12-31 ENCOUNTER — Ambulatory Visit (INDEPENDENT_AMBULATORY_CARE_PROVIDER_SITE_OTHER): Payer: Medicare Other | Admitting: Internal Medicine

## 2013-12-31 ENCOUNTER — Encounter: Payer: Self-pay | Admitting: Internal Medicine

## 2013-12-31 VITALS — BP 110/60 | HR 71 | Temp 97.6°F | Resp 18 | Ht 60.25 in | Wt 107.0 lb

## 2013-12-31 DIAGNOSIS — Z0000 Encounter for general adult medical examination without abnormal findings: Secondary | ICD-10-CM

## 2013-12-31 DIAGNOSIS — C9110 Chronic lymphocytic leukemia of B-cell type not having achieved remission: Secondary | ICD-10-CM

## 2013-12-31 DIAGNOSIS — J449 Chronic obstructive pulmonary disease, unspecified: Secondary | ICD-10-CM

## 2013-12-31 DIAGNOSIS — M810 Age-related osteoporosis without current pathological fracture: Secondary | ICD-10-CM

## 2013-12-31 DIAGNOSIS — E785 Hyperlipidemia, unspecified: Secondary | ICD-10-CM

## 2013-12-31 DIAGNOSIS — I872 Venous insufficiency (chronic) (peripheral): Secondary | ICD-10-CM

## 2013-12-31 DIAGNOSIS — J069 Acute upper respiratory infection, unspecified: Secondary | ICD-10-CM

## 2013-12-31 DIAGNOSIS — M1990 Unspecified osteoarthritis, unspecified site: Secondary | ICD-10-CM

## 2013-12-31 DIAGNOSIS — J029 Acute pharyngitis, unspecified: Secondary | ICD-10-CM

## 2013-12-31 NOTE — Patient Instructions (Signed)
It is important that you exercise regularly, at least 20 minutes 3 to 4 times per week.  If you develop chest pain or shortness of breath seek  medical attention.  Take a calcium supplement, plus 800-1200 units of vitamin D  Return in one year for follow-up   

## 2013-12-31 NOTE — Progress Notes (Signed)
Patient ID: April [name], female   DOB: February 08, 1935, 79 y.o.   MRN: [id number]  Subjective:    Patient ID: April [name], female    DOB: 1935/07/18, 79 y.o.   MRN: [id number]  HPI 33  and a year-old patient who is seen today for a preventive health examination.   She is followed by gynecology. She's had a recent gynecologic evaluation and does have a history of osteoporosis;  Fosamax therapy was discontinued approximately  3 Years ago at the time of her last bone density   and she remains on Prolia. 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 She does quite well takes when necessary Advil only. No new concerns or complaints. She has had some laboratory studies done throughout the year by  Hematology. She has a COPD and has done well on maintenance ICS.  No rescue bronchodilator use.  She has been on probably for about a year and a half and is scheduled for followup bone density soon.  Problems Prior to Update:  1) Osteoarthritis (ICD-715.90)  2) Venous Insufficiency, Chronic (ICD-459.81)  3) Uri (ICD-465.9)  4) Acute Pharyngitis (ICD-462)  5) COPD (ICD-496)  6) Skin Cancer, Hx of (ICD-V10.83)  7) Osteoporosis (ICD-733.00)  8) Hyperlipidemia (ICD-272.4)  9) Diarrhea, Acute (ICD-787.91)  10) Diverticulitis, Hx of (ICD-V12.79)  11) Family History of Asthma (ICD-V17.5)   Medications Prior to Update:  1) Terconazole 0.4 % Crea (Terconazole) .... As Dir   Allergies:  1) ! Levaquin   Past History:  Past Medical History:   Emphysema/Chronic Bronchitis  High Cholesterol  Blood transfusion  Diverticulitis, hx of  Hyperlipidemia  history breast cancer  Osteoporosis  Skin cancer, hx of  COPD  chronic venous insufficiency  Osteoarthritis  CLL  Past Surgical History:   Nasoseptoplasty 1971  Partial Removal of Vagina 1980 ([facility])  Bil Reduction Mammoplasty 1982  Breast Reconstruction 1982  L elbow Reconstruction a 64  Removal of Breast Implants 2001  Appendectomy 1978   Hysterectomy 1978  Mastectomy  Bilateral  Tonsillectomy 1966  colonoscopy 2005 A[facility]enty)  L Salipingectomy 2007  Colectomy 2007  Inguinal herniorrhaphy  incisional hernia repair October 2007  resection basal cell cancer left main 2008   Family History:   Family History of Hodgkins  Family History of Parkinsons  Family History of Arthritis  Family History of Asthma  Family History of Cardiovascular disorder  Father died age 77: Renal failure  Mother died MI age 66 (first MI age 53)-osteoporosis  One brother died age late 81's- Parkinson's 12  Brother dies Hodgkin's disease mid 73's  Brother died accidental death age 68  Sister died newborn meningitis  One sister age 67: asthma, osteoporosis   Social History:   Retired  Married  Former Smoker  Alcohol use-yes  Drug use-no  Regular exercise-no  Medicare Wellness  1. Risk factors, based on past  M,S,F history- cardiovascular risk factors include a history of mild dyslipidemia.  2.  Physical activities: Fairly sedentary but no exercise restrictions  3.  Depression/mood: No history of depression or mood disorder  4.  Hearing: No deficits  5.  ADL's: Independent in all aspects of daily living  6.  Fall risk: Low  7.  Home safety: No problems identified  8.  Height weight, and visual acuity;  9.  Counseling: Calcium vitamin D supplements encouraged more regular exercise recommended  10. Lab orders based on risk factors: Will check a lipid profile and TSH  11. Referral : Follow  GYN and oncology  12. Care plan: Followup bone density study in one year 13. Cognitive assessment: Alert and oriented with normal affect. No cognitive dysfunction.  Past Medical History  Diagnosis Date  . Osteoporosis 05/06/2012    T score -3.3  . 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 Leukemia, chronic lymphoid   . Breast CA   . Bronchitis, chronic     History   Social History  . Marital Status: Married     Spouse Name: N/A    Number of Children: N/A  . Years of Education: N/A   Occupational History  . Not on file.   Social History Main Topics  . Smoking status: Former Smoker    Types: Cigarettes    Quit date: 03/26/1955  . Smokeless tobacco: Never Used  . Alcohol Use: 2.5 oz/week    5 drink(s) per week     Comment: wine  . Drug Use: No  . Sexual Activity: No     Comment: HYST   Other Topics Concern  . Not on file   Social History Narrative  . No narrative on file    Past Surgical History  Procedure Laterality Date  . Abdominal hysterectomy  1980  . Mastectomy  1982    BILATERAL  . Breast implants removed  2001  . Partial colectomy      INTESTINAL ABSCESS WITH SALPINGECTOMY  . Umbillical hernia repair    . Reconstruction left elbow    . Oophorectomy      BSO    Family History  Problem Relation Age of Onset  . Heart disease Mother   . Cancer Brother     HODGKINS  . Cancer Brother     leukemia    Allergies  Allergen Reactions  . Codeine Nausea Only  . Levofloxacin Rash    Current Outpatient Prescriptions on File Prior to Visit  Medication Sig Dispense Refill  . Beclomethasone Dipropionate (QVAR IN) Inhale into the lungs.      Marland Kitchen denosumab (PROLIA) 60 MG/ML SOLN injection Inject 60 mg into the skin every 6 (six) months. Administer in upper arm, thigh, or abdomen      . gabapentin (NEURONTIN) 100 MG capsule Take 100 mg by mouth daily.      Marland Kitchen ibuprofen (ADVIL) 200 MG tablet Take 200 mg by mouth every 6 (six) hours as needed.        . nystatin-triamcinolone ointment (MYCOLOG) Apply topically 2 (two) times daily.  30 g  1  . terconazole (TERAZOL 7) 0.4 % vaginal cream INSERT ONE APPLICATOR VAGINALLY EVERY 2 WEEKS  45 g  2   No current facility-administered medications on file prior to visit.    BP 110/60  Pulse 71  Temp(Src) 97.6 F (36.4 C) (Oral)  Resp 18  Ht 5' 0.25" (1.53 m)  Wt 107 lb (48.535 kg)  BMI 20.73 kg/m2  SpO2 96%  LMP  03/26/1979       Review of Systems  Constitutional: Negative for fever, appetite change, fatigue and unexpected weight change.  HENT: Negative for congestion, dental problem, ear pain, hearing loss, mouth sores, nosebleeds, sinus pressure, sore throat, tinnitus, trouble swallowing and voice change.   Eyes: Negative for photophobia, pain, redness and visual disturbance.  Respiratory: Negative for cough, chest tightness and shortness of breath.   Cardiovascular: Negative for chest pain, palpitations and leg swelling.  Gastrointestinal: Negative for nausea, vomiting, abdominal pain, diarrhea, constipation, blood in stool, abdominal distention and  rectal pain.  Genitourinary: Negative for dysuria, urgency, frequency, hematuria, flank pain, vaginal bleeding, vaginal discharge, difficulty urinating, genital sores, vaginal pain, menstrual problem and pelvic pain.  Musculoskeletal: Negative for arthralgias, back pain and neck stiffness.  Skin: Negative for rash.  Neurological: Negative for dizziness, syncope, speech difficulty, weakness, light-headedness, numbness and headaches.  Hematological: Negative for adenopathy. Does not bruise/bleed easily.  Psychiatric/Behavioral: Negative for suicidal ideas, behavioral problems, self-injury, dysphoric mood and agitation. The patient is not nervous/anxious.    and  and and and a      Objective:   Physical Exam  Constitutional: She is oriented to person, place, and time. She appears well-developed and well-nourished.  HENT:  Head: Normocephalic and atraumatic.  Right Ear: External ear normal.  Left Ear: External ear normal.  Mouth/Throat: Oropharynx is clear and moist.  Few xanthelasma left inner upper lid  Eyes: Conjunctivae and EOM are normal.  Neck: Normal range of motion. Neck supple. No JVD present. No thyromegaly present.  Cardiovascular: Normal rate, regular rhythm, normal heart sounds and intact distal pulses.   No murmur  heard. Pulmonary/Chest: Effort normal and breath sounds normal. She has no wheezes. She has no rales.  Bilateral mastectomies  Abdominal: Soft. Bowel sounds are normal. She exhibits no distension and no mass. There is no tenderness. There is no rebound and no guarding.  Lower midline scar  Musculoskeletal: Normal range of motion. She exhibits no edema and no tenderness.  Neurological: She is alert and oriented to person, place, and time. She has normal reflexes. No cranial nerve deficit. She exhibits normal muscle tone. Coordination normal.  Skin: Skin is warm and dry. No rash noted.  Psychiatric: She has a normal mood and affect. Her behavior is normal.          Assessment & Plan:   Preventive health.  Osteoporosis. Continue calcium and vitamin D supplements;  continue  Prolia CLL. Followup oncology  Recheck one year

## 2013-12-31 NOTE — Progress Notes (Signed)
Pre-visit discussion using our clinic review tool. No additional management support is needed unless otherwise documented below in the visit note.  

## 2014-03-30 ENCOUNTER — Ambulatory Visit (INDEPENDENT_AMBULATORY_CARE_PROVIDER_SITE_OTHER): Payer: Medicare Other | Admitting: Gynecology

## 2014-03-30 ENCOUNTER — Encounter: Payer: Self-pay | Admitting: Gynecology

## 2014-03-30 VITALS — BP 112/66 | Ht 61.0 in | Wt 106.0 lb

## 2014-03-30 DIAGNOSIS — N760 Acute vaginitis: Secondary | ICD-10-CM

## 2014-03-30 DIAGNOSIS — N762 Acute vulvitis: Secondary | ICD-10-CM

## 2014-03-30 DIAGNOSIS — N9089 Other specified noninflammatory disorders of vulva and perineum: Secondary | ICD-10-CM

## 2014-03-30 DIAGNOSIS — N952 Postmenopausal atrophic vaginitis: Secondary | ICD-10-CM

## 2014-03-30 DIAGNOSIS — M810 Age-related osteoporosis without current pathological fracture: Secondary | ICD-10-CM

## 2014-03-30 MED ORDER — TERCONAZOLE 0.4 % VA CREA: TOPICAL_CREAM | VAGINAL | Status: DC

## 2014-03-30 MED ORDER — NYSTATIN-TRIAMCINOLONE 100000-0.1 UNIT/GM-% EX OINT: TOPICAL_OINTMENT | Freq: Two times a day (BID) | CUTANEOUS | Status: DC

## 2014-03-30 NOTE — Progress Notes (Addendum)
Patient ID: April [name], female   DOB: 02/26/1935, 79 y.o.   MRN: [id number] April [name] 02/25/1935 [id number]        79 y.o.  G0P0 for followup exam. Several issues that are below.  Past medical history,surgical history, problem list, medications, allergies, family history and social history were all reviewed and documented as reviewed in the EPIC chart.  ROS:  12 system ROS performed with pertinent positives and negatives included in the history, assessment and plan.   Additional significant findings :  None   Exam: [name] Vitals:   03/30/14 1140  BP: 112/66  Height: 5\' 1"  (1.549 m)  Weight: 106 lb (48.081 kg)   General appearance:  Normal affect, orientation and appearance. Skin: Grossly normal HEENT: Without gross lesions.  No cervical or supraclavicular adenopathy. Thyroid normal.  Lungs:  Clear without wheezing, rales or rhonchi Cardiac: RR, without RMG Abdominal:  Soft, nontender, without masses, guarding, rebound, organomegaly or hernia Chest wall:  Examined lying and sitting without masses or axillary adenopathy. Status post bilateral mastectomies. Pelvic:  Ext/BUS/vagina with generalized atrophic changes. Small raised red nodule left mid labia majora. Physical Exam  Genitourinary:        Adnexa  Without masses or tenderness    Anus and perineum  Normal   Rectovaginal  Normal sphincter tone without palpated masses or tenderness.   Procedure: Skin overlying the small red raised lesions left labia majora was cleansed with Betadine, infiltrated with 1% lidocaine and the lesion was excised in its entirety and sent to pathology. Silver nitrate hemostasis applied afterwards. Postoperative instructions given.   Assessment/Plan:  79 y.o. G0P0 female for followup exam.  1. Postmenopausal/atrophic genital changes. History of chronic vulvitis. Treated with Terazol single application every 2 weeks intravaginal and Mytrex cream externally as needed. Seems to  be controlling all of her symptoms and I refilled both times one year. 2. Bleeding vulvar lesion. Patient does have the last several weeks a small area on her left vulva that keeps spotting. She has a small raised red area. Options for management include observation versus excision reviewed. Ultimately we decided on excision as noted above. Patient will followup for biopsy results. 3. Osteoporosis. DEXA 04/2012-3.3. Currently on Prolia X2 years. Recheck DEXA this fall. Tentatively plan on continuing Prolia for now assuming good response on bone density. Increased calcium vitamin D reviewed. 4. Pap smear 2011. No Pap smear done today. Status post hysterectomy for benign indications. Reviewed current screening guidelines we both agreed to stop screening and she is comfortable with this. 5. Mammography. Status post mastectomy x2 with recommendations for no mammography from her surgeon. Chest wall exam is normal. Continue with cell chest exams and provider annual exams. 6. Colonoscopy 11 years ago. Repeat at her oncologist recommendation. 7. Health maintenance. No routine blood work done as this is done at her other physician's offices. Followup for biopsy results otherwise for bone density in 2 months.   Note: This document was prepared with digital dictation and possible smart phrase technology. Any transcriptional errors that result from this process are unintentional.   [name] MD, 12:15 PM 03/30/2014

## 2014-03-30 NOTE — Addendum Note (Signed)
Addended by: [name] on: 03/30/2014 12:33 PM   Modules accepted: Orders

## 2014-03-31 LAB — URINALYSIS W MICROSCOPIC + REFLEX CULTURE
BACTERIA UA: NONE SEEN
Bilirubin Urine: NEGATIVE
CASTS: NONE SEEN
Crystals: NONE SEEN
Glucose, UA: NEGATIVE mg/dL
Hgb urine dipstick: NEGATIVE
KETONES UR: NEGATIVE mg/dL
LEUKOCYTES UA: NEGATIVE
NITRITE: NEGATIVE
PH: 6 (ref 5.0–8.0)
PROTEIN: NEGATIVE mg/dL
Specific Gravity, Urine: 1.006 (ref 1.005–1.030)
Squamous Epithelial / LPF: NONE SEEN
Urobilinogen, UA: 0.2 mg/dL (ref 0.0–1.0)

## 2014-04-06 ENCOUNTER — Ambulatory Visit (INDEPENDENT_AMBULATORY_CARE_PROVIDER_SITE_OTHER): Payer: Medicare Other

## 2014-04-06 ENCOUNTER — Other Ambulatory Visit: Payer: Self-pay | Admitting: Gynecology

## 2014-04-06 DIAGNOSIS — Z23 Encounter for immunization: Secondary | ICD-10-CM

## 2014-04-27 ENCOUNTER — Ambulatory Visit (HOSPITAL_BASED_OUTPATIENT_CLINIC_OR_DEPARTMENT_OTHER): Payer: Medicare Other | Admitting: Hematology and Oncology

## 2014-04-27 ENCOUNTER — Other Ambulatory Visit (HOSPITAL_BASED_OUTPATIENT_CLINIC_OR_DEPARTMENT_OTHER): Payer: Medicare Other

## 2014-04-27 ENCOUNTER — Telehealth: Payer: Self-pay | Admitting: Hematology and Oncology

## 2014-04-27 ENCOUNTER — Encounter: Payer: Self-pay | Admitting: Hematology and Oncology

## 2014-04-27 VITALS — BP 132/77 | HR 73 | Temp 98.0°F | Resp 18 | Ht 61.0 in | Wt 107.0 lb

## 2014-04-27 DIAGNOSIS — C9110 Chronic lymphocytic leukemia of B-cell type not having achieved remission: Secondary | ICD-10-CM

## 2014-04-27 LAB — TECHNOLOGIST REVIEW

## 2014-04-27 LAB — COMPREHENSIVE METABOLIC PANEL (CC13)
ALT: 20 U/L (ref 0–55)
ANION GAP: 6 meq/L (ref 3–11)
AST: 22 U/L (ref 5–34)
Albumin: 3.9 g/dL (ref 3.5–5.0)
Alkaline Phosphatase: 71 U/L (ref 40–150)
BUN: 14.9 mg/dL (ref 7.0–26.0)
CALCIUM: 10 mg/dL (ref 8.4–10.4)
CO2: 30 meq/L — AB (ref 22–29)
Chloride: 105 mEq/L (ref 98–109)
Creatinine: 0.9 mg/dL (ref 0.6–1.1)
GLUCOSE: 99 mg/dL (ref 70–140)
POTASSIUM: 4.2 meq/L (ref 3.5–5.1)
Sodium: 141 mEq/L (ref 136–145)
TOTAL PROTEIN: 6.9 g/dL (ref 6.4–8.3)
Total Bilirubin: 0.92 mg/dL (ref 0.20–1.20)

## 2014-04-27 LAB — CBC WITH DIFFERENTIAL/PLATELET
BASO%: 0.1 % (ref 0.0–2.0)
Basophils Absolute: 0.1 10*3/uL (ref 0.0–0.1)
EOS ABS: 0.2 10*3/uL (ref 0.0–0.5)
EOS%: 0.6 % (ref 0.0–7.0)
HCT: 42.7 % (ref 34.8–46.6)
HGB: 13.6 g/dL (ref 11.6–15.9)
LYMPH%: 87.9 % — ABNORMAL HIGH (ref 14.0–49.7)
MCH: 32.7 pg (ref 25.1–34.0)
MCHC: 31.9 g/dL (ref 31.5–36.0)
MCV: 102.5 fL — ABNORMAL HIGH (ref 79.5–101.0)
MONO#: 1.1 10*3/uL — AB (ref 0.1–0.9)
MONO%: 2.6 % (ref 0.0–14.0)
NEUT%: 8.8 % — ABNORMAL LOW (ref 38.4–76.8)
NEUTROS ABS: 3.6 10*3/uL (ref 1.5–6.5)
Platelets: 152 10*3/uL (ref 145–400)
RBC: 4.16 10*6/uL (ref 3.70–5.45)
RDW: 13.5 % (ref 11.2–14.5)
WBC: 40.9 10*3/uL — AB (ref 3.9–10.3)
lymph#: 35.9 10*3/uL — ABNORMAL HIGH (ref 0.9–3.3)

## 2014-04-27 LAB — LACTATE DEHYDROGENASE (CC13): LDH: 171 U/L (ref 125–245)

## 2014-04-27 NOTE — Telephone Encounter (Signed)
gv and printed appt sched and avs for pt for Sept 2016

## 2014-04-27 NOTE — Assessment & Plan Note (Signed)
Clinically, she remains at stage 0. She has no signs or symptoms to suggest disease progression. I plan on seeing her on a yearly basis only with history, physical examination and blood work.

## 2014-04-27 NOTE — Progress Notes (Signed)
[facility] progress notes  Patient Care Team: Marletta Lor, MD as PCP - General  CHIEF COMPLAINTS/PURPOSE OF VISIT:  CLL, stage 0  HISTORY OF PRESENTING ILLNESS:  April [name] 79 y.o. female was transferred to my care after her prior physician has left.  I reviewed the patient's records extensive and collaborated the history with the patient. Summary of her history is as follows: This is a pleasant woman with stage 0 chronic lymphocytic leukemia not requiring any specific treatment so far. She has a slow white count doubling time.  She denies recent infection. Denies New lymphadenopathy.  MEDICAL HISTORY:  Past Medical History  Diagnosis Date  . Osteoporosis 05/06/2012    T score -3.3  . 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 Leukemia, chronic lymphoid   . Breast CA   . Bronchitis, chronic     SURGICAL HISTORY: Past Surgical History  Procedure Laterality Date  . Abdominal hysterectomy  1980  . Mastectomy  1982    BILATERAL  . Breast implants removed  2001  . Partial colectomy      INTESTINAL ABSCESS WITH SALPINGECTOMY  . Umbillical hernia repair    . Reconstruction left elbow    . Oophorectomy      BSO  . Breast surgery      Bilateral mastectomy    SOCIAL HISTORY: History   Social History  . Marital Status: Married    Spouse Name: N/A    Number of Children: N/A  . Years of Education: N/A   Occupational History  . Not on file.   Social History Main Topics  . Smoking status: Former Smoker    Types: Cigarettes    Quit date: 03/26/1955  . Smokeless tobacco: Never Used  . Alcohol Use: 2.5 oz/week    5 drink(s) per week     Comment: wine  . Drug Use: No  . Sexual Activity: No     Comment: HYST   Other Topics Concern  . Not on file   Social History Narrative  . No narrative on file    FAMILY HISTORY: Family History  Problem Relation Age of Onset  . Heart disease Mother   . Cancer Brother    HODGKINS  . Cancer Brother     leukemia  . Heart disease Sister     ALLERGIES:  is allergic to codeine and levofloxacin.  MEDICATIONS:  Current Outpatient Prescriptions  Medication Sig Dispense Refill  . aspirin 81 MG tablet Take 81 mg by mouth daily.      . Beclomethasone Dipropionate (QVAR IN) Inhale into the lungs.      Marland Kitchen denosumab (PROLIA) 60 MG/ML SOLN injection Inject 60 mg into the skin every 6 (six) months. Administer in upper arm, thigh, or abdomen      . gabapentin (NEURONTIN) 100 MG capsule Take 100 mg by mouth daily.      Marland Kitchen ibuprofen (ADVIL) 200 MG tablet Take 200 mg by mouth every 6 (six) hours as needed.        . nystatin-triamcinolone ointment (MYCOLOG) Apply topically 2 (two) times daily.  30 g  2  . terconazole (TERAZOL 7) 0.4 % vaginal cream INSERT ONE APPLICATOR VAGINALLY EVERY 2 WEEKS  45 g  2   No current facility-administered medications for this visit.    REVIEW OF SYSTEMS:   Constitutional: Denies fevers, chills or abnormal night sweats Eyes: Denies blurriness of vision, double vision or watery eyes Ears,  nose, mouth, throat, and face: Denies mucositis or sore throat Respiratory: Denies cough, dyspnea or wheezes Cardiovascular: Denies palpitation, chest discomfort or lower extremity swelling Gastrointestinal:  Denies nausea, heartburn or change in bowel habits Skin: Denies abnormal skin rashes Lymphatics: Denies new lymphadenopathy or easy bruising Neurological:Denies numbness, tingling or new weaknesses Behavioral/Psych: Mood is stable, no new changes  All other systems were reviewed with the patient and are negative.  PHYSICAL EXAMINATION: ECOG PERFORMANCE STATUS: 0 - Asymptomatic  Filed Vitals:   04/27/14 1419  BP: 132/77  Pulse: 73  Temp: 98 F (36.7 C)  Resp: 18   Filed Weights   04/27/14 1419  Weight: 107 lb (48.535 kg)    GENERAL:alert, no distress and comfortable. She looks thin SKIN: skin color, texture, turgor are normal, no rashes  or significant lesions EYES: normal, conjunctiva are pink and non-injected, sclera clear OROPHARYNX:no exudate, normal lips, buccal mucosa, and tongue  NECK: supple, thyroid normal size, non-tender, without nodularity LYMPH:  no palpable lymphadenopathy in the cervical, axillary or inguinal LUNGS: clear to auscultation and percussion with normal breathing effort HEART: regular rate & rhythm and no murmurs without lower extremity edema ABDOMEN:abdomen soft, non-tender and normal bowel sounds Musculoskeletal:no cyanosis of digits and no clubbing  PSYCH: alert & oriented x 3 with fluent speech NEURO: no focal motor/sensory deficits  LABORATORY DATA:  I have reviewed the data as listed Lab Results  Component Value Date   WBC 40.9* 04/27/2014   HGB 13.6 04/27/2014   HCT 42.7 04/27/2014   MCV 102.5* 04/27/2014   PLT 152 04/27/2014    Recent Labs  10/26/13 1111 04/27/14 1355  NA 142 141  K 5.1 4.2  CO2 24 30*  GLUCOSE 93 99  BUN 12.7 14.9  CREATININE 0.8 0.9  CALCIUM 9.4 10.0  PROT 7.0 6.9  ALBUMIN 4.1 3.9  AST 21 22  ALT 19 20  ALKPHOS 56 71  BILITOT 0.76 0.92   ASSESSMENT & PLAN:  LEUKEMIA, LYMPHOCYTIC, CHRONIC Clinically, she remains at stage 0. She has no signs or symptoms to suggest disease progression. I plan on seeing her on a yearly basis only with history, physical examination and blood work.    Orders Placed This Encounter  Procedures  . CBC with Differential    Standing Status: Future     Number of Occurrences:      Standing Expiration Date: 06/01/2015  . Comprehensive metabolic panel    Standing Status: Future     Number of Occurrences:      Standing Expiration Date: 06/01/2015  . Lactate dehydrogenase    Standing Status: Future     Number of Occurrences:      Standing Expiration Date: 06/01/2015    All questions were answered. The patient knows to call the clinic with any problems, questions or concerns. I spent 15 minutes counseling the patient face to  face. The total time spent in the appointment was 20 minutes and more than 50% was on counseling.     [facility], [place], MD 04/27/2014 3:13 PM

## 2014-05-31 ENCOUNTER — Encounter: Payer: Self-pay | Admitting: Gynecology

## 2014-05-31 ENCOUNTER — Ambulatory Visit (INDEPENDENT_AMBULATORY_CARE_PROVIDER_SITE_OTHER): Payer: Medicare Other

## 2014-05-31 DIAGNOSIS — M810 Age-related osteoporosis without current pathological fracture: Secondary | ICD-10-CM

## 2014-06-15 ENCOUNTER — Telehealth: Payer: Self-pay | Admitting: *Deleted

## 2014-06-15 NOTE — Telephone Encounter (Signed)
Pt was called after checking benefits on Prolia. Pts is responsible for a $50 copay for Prolia and then a $35 for administration for a total of $85. She wants to receive injection on Thursday 06/17/14 at 2pm. Will be scheduled. KW CMA/PF

## 2014-06-17 ENCOUNTER — Ambulatory Visit (INDEPENDENT_AMBULATORY_CARE_PROVIDER_SITE_OTHER): Payer: Medicare Other | Admitting: *Deleted

## 2014-06-17 DIAGNOSIS — M810 Age-related osteoporosis without current pathological fracture: Secondary | ICD-10-CM

## 2014-06-17 MED ORDER — DENOSUMAB 60 MG/ML ~~LOC~~ SOLN
60.0000 mg | Freq: Once | SUBCUTANEOUS | Status: AC
  Administered 2014-06-17: 60 mg via SUBCUTANEOUS

## 2014-12-07 ENCOUNTER — Telehealth: Payer: Self-pay | Admitting: Gynecology

## 2014-12-07 DIAGNOSIS — M810 Age-related osteoporosis without current pathological fracture: Secondary | ICD-10-CM

## 2014-12-07 NOTE — Telephone Encounter (Addendum)
Prolia injection due after 12/17/14. Last injection 06/17/2014. Calcium level 10.0 04/27/14. No deductible. Prolia copay of $50 and also Co-pay $40 with or without OV for administration for Prolia. Total for pt $90.00  OOP MAX $4000. Pt scheduled 12/20/14 at 2:30pm

## 2014-12-13 ENCOUNTER — Other Ambulatory Visit: Payer: Self-pay | Admitting: Gynecology

## 2014-12-13 NOTE — Telephone Encounter (Signed)
Phone call to explain co- pay for medication Prolia $50 and administration co-pay $40.  Total for pt. $90.00.  Next injection will be due after 06/23/15.

## 2014-12-20 ENCOUNTER — Ambulatory Visit (INDEPENDENT_AMBULATORY_CARE_PROVIDER_SITE_OTHER): Payer: Medicare Other | Admitting: Anesthesiology

## 2014-12-20 DIAGNOSIS — M810 Age-related osteoporosis without current pathological fracture: Secondary | ICD-10-CM

## 2014-12-20 MED ORDER — DENOSUMAB 60 MG/ML ~~LOC~~ SOLN
60.0000 mg | Freq: Once | SUBCUTANEOUS | Status: AC
  Administered 2014-12-20: 60 mg via SUBCUTANEOUS

## 2014-12-21 NOTE — Telephone Encounter (Signed)
April [name] received her Prolia injection on 12/20/14. Next injection due after 06/23/15. Calcium level 10.0 04/27/14 . She will need repeat Calcium level prior to Prolia injection. Order placed for Calcium level at exam in Sept with Dr Phineas Real.

## 2015-01-03 ENCOUNTER — Ambulatory Visit (INDEPENDENT_AMBULATORY_CARE_PROVIDER_SITE_OTHER): Payer: Medicare Other | Admitting: Internal Medicine

## 2015-01-03 ENCOUNTER — Encounter: Payer: Self-pay | Admitting: Internal Medicine

## 2015-01-03 VITALS — BP 100/68 | HR 75 | Temp 97.9°F | Resp 18 | Ht 61.0 in | Wt 106.0 lb

## 2015-01-03 DIAGNOSIS — Z23 Encounter for immunization: Secondary | ICD-10-CM | POA: Diagnosis not present

## 2015-01-03 DIAGNOSIS — E785 Hyperlipidemia, unspecified: Secondary | ICD-10-CM

## 2015-01-03 DIAGNOSIS — M810 Age-related osteoporosis without current pathological fracture: Secondary | ICD-10-CM

## 2015-01-03 DIAGNOSIS — C9110 Chronic lymphocytic leukemia of B-cell type not having achieved remission: Secondary | ICD-10-CM

## 2015-01-03 DIAGNOSIS — M150 Primary generalized (osteo)arthritis: Secondary | ICD-10-CM | POA: Diagnosis not present

## 2015-01-03 DIAGNOSIS — M159 Polyosteoarthritis, unspecified: Secondary | ICD-10-CM

## 2015-01-03 DIAGNOSIS — Z0000 Encounter for general adult medical examination without abnormal findings: Secondary | ICD-10-CM | POA: Diagnosis not present

## 2015-01-03 LAB — TSH: TSH: 1.54 u[IU]/mL (ref 0.35–4.50)

## 2015-01-03 NOTE — Patient Instructions (Signed)
Limit your sodium (Salt) intake    It is important that you exercise regularly, at least 20 minutes 3 to 4 times per week.  If you develop chest pain or shortness of breath seek  medical attention.  Return in one year for follow-up  Consider screening colonoscopy.  Continue annual eye examination as well as gynecologic checks.  Continue to see hematology every year.  Take a calcium supplement, plus [phone] units of vitamin D  Health Maintenance Adopting a healthy lifestyle and getting preventive care can go a long way to promote health and wellness. Talk with your health care provider about what schedule of regular examinations is right for you. This is a good chance for you to check in with your provider about disease prevention and staying healthy. In between checkups, there are plenty of things you can do on your own. Experts have done a lot of research about which lifestyle changes and preventive measures are most likely to keep you healthy. Ask your health care provider for more information. WEIGHT AND DIET  Eat a healthy diet  Be sure to include plenty of vegetables, fruits, low-fat dairy products, and lean protein.  Do not eat a lot of foods high in solid fats, added sugars, or salt.  Get regular exercise. This is one of the most important things you can do for your health.  Most adults should exercise for at least 150 minutes each week. The exercise should increase your heart rate and make you sweat (moderate-intensity exercise).  Most adults should also do strengthening exercises at least twice a week. This is in addition to the moderate-intensity exercise.  Maintain a healthy weight  Body mass index (BMI) is a measurement that can be used to identify possible weight problems. It estimates body fat based on height and weight. Your health care provider can help determine your BMI and help you achieve or maintain a healthy weight.  For females 40 years of age and older:   A BMI  below 18.5 is considered underweight.  A BMI of 18.5 to 24.9 is normal.  A BMI of 25 to 29.9 is considered overweight.  A BMI of 30 and above is considered obese.  Watch levels of cholesterol and blood lipids  You should start having your blood tested for lipids and cholesterol at 78 years of age, then have this test every 5 years.  You may need to have your cholesterol levels checked more often if:  Your lipid or cholesterol levels are high.  You are older than 78 years of age.  You are at high risk for heart disease.  CANCER SCREENING   Lung Cancer  Lung cancer screening is recommended for adults 60-16 years old who are at high risk for lung cancer because of a history of smoking.  A yearly low-dose CT scan of the lungs is recommended for people who:  Currently smoke.  Have quit within the past 15 years.  Have at least a 30-pack-year history of smoking. A pack year is smoking an average of one pack of cigarettes a day for 1 year.  Yearly screening should continue until it has been 15 years since you quit.  Yearly screening should stop if you develop a health problem that would prevent you from having lung cancer treatment.  Breast Cancer  Practice breast self-awareness. This means understanding how your breasts normally appear and feel.  It also means doing regular breast self-exams. Let your health care provider know about any changes, no  matter how small.  If you are in your 20s or 30s, you should have a clinical breast exam (CBE) by a health care provider every 1-3 years as part of a regular health exam.  If you are 40 or older, have a CBE every year. Also consider having a breast X-ray (mammogram) every year.  If you have a family history of breast cancer, talk to your health care provider about genetic screening.  If you are at high risk for breast cancer, talk to your health care provider about having an MRI and a mammogram every year.  Breast cancer gene  (BRCA) assessment is recommended for women who have family members with BRCA-related cancers. BRCA-related cancers include:  Breast.  Ovarian.  Tubal.  Peritoneal cancers.  Results of the assessment will determine the need for genetic counseling and BRCA1 and BRCA2 testing. Cervical Cancer Routine pelvic examinations to screen for cervical cancer are no longer recommended for nonpregnant women who are considered low risk for cancer of the pelvic organs (ovaries, uterus, and vagina) and who do not have symptoms. A pelvic examination may be necessary if you have symptoms including those associated with pelvic infections. Ask your health care provider if a screening pelvic exam is right for you.   The Pap test is the screening test for cervical cancer for women who are considered at risk.  If you had a hysterectomy for a problem that was not cancer or a condition that could lead to cancer, then you no longer need Pap tests.  If you are older than 65 years, and you have had normal Pap tests for the past 10 years, you no longer need to have Pap tests.  If you have had past treatment for cervical cancer or a condition that could lead to cancer, you need Pap tests and screening for cancer for at least 20 years after your treatment.  If you no longer get a Pap test, assess your risk factors if they change (such as having a new sexual partner). This can affect whether you should start being screened again.  Some women have medical problems that increase their chance of getting cervical cancer. If this is the case for you, your health care provider may recommend more frequent screening and Pap tests.  The human papillomavirus (HPV) test is another test that may be used for cervical cancer screening. The HPV test looks for the virus that can cause cell changes in the cervix. The cells collected during the Pap test can be tested for HPV.  The HPV test can be used to screen women 30 years of age and  older. Getting tested for HPV can extend the interval between normal Pap tests from three to five years.  An HPV test also should be used to screen women of any age who have unclear Pap test results.  After 78 years of age, women should have HPV testing as often as Pap tests.  Colorectal Cancer  This type of cancer can be detected and often prevented.  Routine colorectal cancer screening usually begins at 78 years of age and continues through 78 years of age.  Your health care provider may recommend screening at an earlier age if you have risk factors for colon cancer.  Your health care provider may also recommend using home test kits to check for hidden blood in the stool.  A small camera at the end of a tube can be used to examine your colon directly (sigmoidoscopy or colonoscopy). This is   done to check for the earliest forms of colorectal cancer.  Routine screening usually begins at age 50.  Direct examination of the colon should be repeated every 5-10 years through 78 years of age. However, you may need to be screened more often if early forms of precancerous polyps or small growths are found. Skin Cancer  Check your skin from head to toe regularly.  Tell your health care provider about any new moles or changes in moles, especially if there is a change in a mole's shape or color.  Also tell your health care provider if you have a mole that is larger than the size of a pencil eraser.  Always use sunscreen. Apply sunscreen liberally and repeatedly throughout the day.  Protect yourself by wearing long sleeves, pants, a wide-brimmed hat, and sunglasses whenever you are outside. HEART DISEASE, DIABETES, AND HIGH BLOOD PRESSURE   Have your blood pressure checked at least every 1-2 years. High blood pressure causes heart disease and increases the risk of stroke.  If you are between 55 years and 79 years old, ask your health care provider if you should take aspirin to prevent  strokes.  Have regular diabetes screenings. This involves taking a blood sample to check your fasting blood sugar level.  If you are at a normal weight and have a low risk for diabetes, have this test once every three years after 78 years of age.  If you are overweight and have a high risk for diabetes, consider being tested at a younger age or more often. PREVENTING INFECTION  Hepatitis B  If you have a higher risk for hepatitis B, you should be screened for this virus. You are considered at high risk for hepatitis B if:  You were born in a country where hepatitis B is common. Ask your health care provider which countries are considered high risk.  Your parents were born in a high-risk country, and you have not been immunized against hepatitis B (hepatitis B vaccine).  You have HIV or AIDS.  You use needles to inject street drugs.  You live with someone who has hepatitis B.  You have had sex with someone who has hepatitis B.  You get hemodialysis treatment.  You take certain medicines for conditions, including cancer, organ transplantation, and autoimmune conditions. Hepatitis C  Blood testing is recommended for:  Everyone born from 1945 through 1965.  Anyone with known risk factors for hepatitis C. Sexually transmitted infections (STIs)  You should be screened for sexually transmitted infections (STIs) including gonorrhea and chlamydia if:  You are sexually active and are younger than 78 years of age.  You are older than 78 years of age and your health care provider tells you that you are at risk for this type of infection.  Your sexual activity has changed since you were last screened and you are at an increased risk for chlamydia or gonorrhea. Ask your health care provider if you are at risk.  If you do not have HIV, but are at risk, it may be recommended that you take a prescription medicine daily to prevent HIV infection. This is called pre-exposure prophylaxis  (PrEP). You are considered at risk if:  You are sexually active and do not regularly use condoms or know the HIV status of your partner(s).  You take drugs by injection.  You are sexually active with a partner who has HIV. Talk with your health care provider about whether you are at high risk of being infected   with HIV. If you choose to begin PrEP, you should first be tested for HIV. You should then be tested every 3 months for as long as you are taking PrEP.  PREGNANCY   If you are premenopausal and you may become pregnant, ask your health care provider about preconception counseling.  If you may become pregnant, take 400 to 800 micrograms (mcg) of folic acid every day.  If you want to prevent pregnancy, talk to your health care provider about birth control (contraception). OSTEOPOROSIS AND MENOPAUSE   Osteoporosis is a disease in which the bones lose minerals and strength with aging. This can result in serious bone fractures. Your risk for osteoporosis can be identified using a bone density scan.  If you are 65 years of age or older, or if you are at risk for osteoporosis and fractures, ask your health care provider if you should be screened.  Ask your health care provider whether you should take a calcium or vitamin D supplement to lower your risk for osteoporosis.  Menopause may have certain physical symptoms and risks.  Hormone replacement therapy may reduce some of these symptoms and risks. Talk to your health care provider about whether hormone replacement therapy is right for you.  HOME CARE INSTRUCTIONS   Schedule regular health, dental, and eye exams.  Stay current with your immunizations.   Do not use any tobacco products including cigarettes, chewing tobacco, or electronic cigarettes.  If you are pregnant, do not drink alcohol.  If you are breastfeeding, limit how much and how often you drink alcohol.  Limit alcohol intake to no more than 1 drink per day for  nonpregnant women. One drink equals 12 ounces of beer, 5 ounces of wine, or 1 ounces of hard liquor.  Do not use street drugs.  Do not share needles.  Ask your health care provider for help if you need support or information about quitting drugs.  Tell your health care provider if you often feel depressed.  Tell your health care provider if you have ever been abused or do not feel safe at home. Document Released: 01/29/2011 Document Revised: 11/30/2013 Document Reviewed: 06/17/2013 [employer] Patient Information 2015 [employer]. This information is not intended to replace advice given to you by your health care provider. Make sure you discuss any questions you have with your health care provider.  

## 2015-01-03 NOTE — Progress Notes (Signed)
Subjective:    Patient ID: April [name], female    DOB: 10/24/1934, 80 y.o.   MRN: [id number]  HPI  Patient ID: April [name], female   DOB: 02/02/1935, 80 y.o.   MRN: [id number]  Subjective:    Patient ID: April [name], female    DOB: 07/07/1935, 80 y.o.   MRN: [id number]  HPI 55  and [age] year-old patient who is seen today for a preventive health examination.   She is followed by gynecology. She's had a recent gynecologic evaluation and does have a history of osteoporosis;  Fosamax therapy was discontinued approximately  4 Years ago at the time of her last bone density   and she remains on Prolia. 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 She does quite well takes when necessary Advil only. No new concerns or complaints. She has had some laboratory studies done throughout the year by  Hematology. She has a COPD and has done well on maintenance ICS.  No rescue bronchodilator use.  .  Problems Prior to Update:  1) Osteoarthritis (ICD-715.90)  2) Venous Insufficiency, Chronic (ICD-459.81)  3) Uri (ICD-465.9)  4) Acute Pharyngitis (ICD-462)  5) COPD (ICD-496)  6) Skin Cancer, Hx of (ICD-V10.83)  7) Osteoporosis (ICD-733.00)  8) Hyperlipidemia (ICD-272.4)  9) Diarrhea, Acute (ICD-787.91)  10) Diverticulitis, Hx of (ICD-V12.79)  11) Family History of Asthma (ICD-V17.5)   Medications Prior to Update:  1) Terconazole 0.4 % Crea (Terconazole) .... As Dir   Allergies:  1) ! Levaquin   Past History:  Past Medical History:   Emphysema/Chronic Bronchitis  High Cholesterol  Blood transfusion  Diverticulitis, hx of  Hyperlipidemia  history breast cancer  Osteoporosis  Skin cancer, hx of  COPD  chronic venous insufficiency  Osteoarthritis  CLL  Past Surgical History:   Nasoseptoplasty 1971  Partial Removal of Vagina 1980 ([facility])  Bil Reduction Mammoplasty 1982  Breast Reconstruction 1982  L elbow Reconstruction a 27  Removal of Breast Implants 2001    Appendectomy 1978  Hysterectomy 1978  Mastectomy  Bilateral  Tonsillectomy 1966  colonoscopy 2005 [facility]enty)  L Salipingectomy 2007  Colectomy 2007  Inguinal herniorrhaphy  incisional hernia repair October 2007  resection basal cell cancer left main 2008   Family History:   Family History of Hodgkins  Family History of Parkinsons  Family History of Arthritis  Family History of Asthma  Family History of Cardiovascular disorder  Father died age 67: Renal failure  Mother died MI age 62 (first MI age 39)-osteoporosis  One brother died age late 32's- Parkinson's 75  Brother dies Hodgkin's disease mid 33's  Brother died accidental death age 80  Sister died newborn meningitis  One sister age 17: asthma, osteoporosis   Social History:   Retired  Married  Former Smoker  Alcohol use-yes  Drug use-no  Regular exercise-no  Medicare Wellness  1. Risk factors, based on past  M,S,F history- cardiovascular risk factors include a history of mild dyslipidemia.  2.  Physical activities: Fairly sedentary but no exercise restrictions.  Does gardening  3.  Depression/mood: No history of depression or mood disorder  4.  Hearing: No deficits  5.  ADL's: Independent in all aspects of daily living  6.  Fall risk: Low  7.  Home safety: No problems identified  8.  Height weight, and visual acuity;  9.  Counseling: Calcium vitamin D supplements encouraged more regular exercise recommended  10. Lab orders based on risk factors: Will check  a lipid profile and TSH  11. Referral : Follow GYN and oncology  12. Care plan: Followup bone density study in one year 13. Cognitive assessment: Alert and oriented with normal affect. No cognitive dysfunction. 14.  Preventive services will include annual gynecologic evaluations.  She has a history of osteoporosis and serial bone density studies will be considered.  It has been 10 years since her last colonoscopy and screening tests will be  discussed and entertained today.  She'll be seen for an annual exam and will be followed by oncology and ophthalmology on an annual basis 15.  Provider list update includes gynecology ophthalmology and oncology  Past Medical History  Diagnosis Date  . Osteoporosis 05/31/2014     T score -3.1 stable at [facility] and left hip, statistically improved right hip from prior DEXA 2013  . 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 Leukemia, chronic lymphoid   . Breast CA   . Bronchitis, chronic     History   Social History  . Marital Status: Married    Spouse Name: N/A  . Number of Children: N/A  . Years of Education: N/A   Occupational History  . Not on file.   Social History Main Topics  . Smoking status: Former Smoker    Types: Cigarettes    Quit date: 03/26/1955  . Smokeless tobacco: Never Used  . Alcohol Use: 2.5 oz/week    5 drink(s) per week     Comment: wine  . Drug Use: No  . Sexual Activity: No     Comment: HYST   Other Topics Concern  . Not on file   Social History Narrative    Past Surgical History  Procedure Laterality Date  . Abdominal hysterectomy  1980  . Mastectomy  1982    BILATERAL  . Breast implants removed  2001  . Partial colectomy      INTESTINAL ABSCESS WITH SALPINGECTOMY  . Umbillical hernia repair    . Reconstruction left elbow    . Oophorectomy      BSO  . Breast surgery      Bilateral mastectomy    Family History  Problem Relation Age of Onset  . Heart disease Mother   . Cancer Brother     HODGKINS  . Cancer Brother     leukemia  . Heart disease Sister     Allergies  Allergen Reactions  . Codeine Nausea Only  . Levofloxacin Rash    Current Outpatient Prescriptions on File Prior to Visit  Medication Sig Dispense Refill  . aspirin 81 MG tablet Take 81 mg by mouth daily.    . Beclomethasone Dipropionate (QVAR IN) Inhale into the lungs.    Marland Kitchen denosumab (PROLIA) 60 MG/ML SOLN injection Inject 60 mg into the skin  every 6 (six) months. Administer in upper arm, thigh, or abdomen    . ibuprofen (ADVIL) 200 MG tablet Take 200 mg by mouth every 6 (six) hours as needed.      . nystatin-triamcinolone ointment (MYCOLOG) Apply topically 2 (two) times daily. 30 g 2  . terconazole (TERAZOL 7) 0.4 % vaginal cream INSERT ONE APPLICATOR VAGINALLY EVERY 2 WEEKS 45 g 1   No current facility-administered medications on file prior to visit.    BP 100/68 mmHg  Pulse 75  Temp(Src) 97.9 F (36.6 C) (Oral)  Resp 18  Ht 5\' 1"  (1.549 m)  Wt 106 lb (48.081 kg)  BMI 20.04 kg/m2  SpO2 97%  LMP 03/26/1979       Review of Systems  Constitutional: Negative for fever, appetite change, fatigue and unexpected weight change.  HENT: Negative for congestion, dental problem, ear pain, hearing loss, mouth sores, nosebleeds, sinus pressure, sore throat, tinnitus, trouble swallowing and voice change.   Eyes: Negative for photophobia, pain, redness and visual disturbance.  Respiratory: Negative for cough, chest tightness and shortness of breath.   Cardiovascular: Negative for chest pain, palpitations and leg swelling.  Gastrointestinal: Negative for nausea, vomiting, abdominal pain, diarrhea, constipation, blood in stool, abdominal distention and rectal pain.  Genitourinary: Negative for dysuria, urgency, frequency, hematuria, flank pain, vaginal bleeding, vaginal discharge, difficulty urinating, genital sores, vaginal pain, menstrual problem and pelvic pain.  Musculoskeletal: Negative for arthralgias, back pain and neck stiffness.  Skin: Negative for rash.  Neurological: Negative for dizziness, syncope, speech difficulty, weakness, light-headedness, numbness and headaches.  Hematological: Negative for adenopathy. Does not bruise/bleed easily.  Psychiatric/Behavioral: Negative for suicidal ideas, behavioral problems, self-injury, dysphoric mood and agitation. The patient is not nervous/anxious.    and  and and and a        Objective:   Physical Exam  Constitutional: She is oriented to person, place, and time. She appears well-developed and well-nourished.  HENT:  Head: Normocephalic and atraumatic.  Right Ear: External ear normal.  Left Ear: External ear normal.  Mouth/Throat: Oropharynx is clear and moist.  Few xanthelasma left inner upper lid  Eyes: Conjunctivae and EOM are normal.  Neck: Normal range of motion. Neck supple. No JVD present. No thyromegaly present.  Cardiovascular: Normal rate, regular rhythm, normal heart sounds and intact distal pulses.   No murmur heard. Pulmonary/Chest: Effort normal and breath sounds normal. She has no wheezes. She has no rales.  Bilateral mastectomies  Abdominal: Soft. Bowel sounds are normal. She exhibits no distension and no mass. There is no tenderness. There is no rebound and no guarding.  Lower midline scar  Musculoskeletal: Normal range of motion. She exhibits no edema and no tenderness.  Neurological: She is alert and oriented to person, place, and time. She has normal reflexes. No cranial nerve deficit. She exhibits normal muscle tone. Coordination normal.  Skin: Skin is warm and dry. No rash noted.  Psychiatric: She has a normal mood and affect. Her behavior is normal.          Assessment & Plan:   Preventive health.  Osteoporosis. Continue calcium and vitamin D supplements;  continue  Prolia CLL. Followup oncology  Recheck one year    Review of Systems As above    Objective:   Physical Exam  As above      Assessment & Plan:   As above We'll check a TSH.  We'll discuss colon cancer screening Recheck one year

## 2015-01-03 NOTE — Progress Notes (Signed)
Pre visit review using our clinic review tool, if applicable. No additional management support is needed unless otherwise documented below in the visit note. 

## 2015-01-06 ENCOUNTER — Encounter: Payer: Self-pay | Admitting: Internal Medicine

## 2015-01-11 LAB — COLOGUARD

## 2015-02-01 ENCOUNTER — Encounter: Payer: Self-pay | Admitting: Internal Medicine

## 2015-02-21 ENCOUNTER — Encounter: Payer: Self-pay | Admitting: Internal Medicine

## 2015-02-22 ENCOUNTER — Telehealth: Payer: Self-pay | Admitting: *Deleted

## 2015-02-22 NOTE — Telephone Encounter (Signed)
Spoke to pt, told her Cologuard screening test came back negative. Pt verbalized understanding.

## 2015-04-01 ENCOUNTER — Encounter: Payer: Self-pay | Admitting: Gynecology

## 2015-04-01 ENCOUNTER — Ambulatory Visit (INDEPENDENT_AMBULATORY_CARE_PROVIDER_SITE_OTHER): Payer: Medicare Other | Admitting: Gynecology

## 2015-04-01 VITALS — BP 118/74 | Ht 60.0 in | Wt 105.0 lb

## 2015-04-01 DIAGNOSIS — Z01419 Encounter for gynecological examination (general) (routine) without abnormal findings: Secondary | ICD-10-CM

## 2015-04-01 DIAGNOSIS — C50919 Malignant neoplasm of unspecified site of unspecified female breast: Secondary | ICD-10-CM | POA: Diagnosis not present

## 2015-04-01 DIAGNOSIS — M810 Age-related osteoporosis without current pathological fracture: Secondary | ICD-10-CM

## 2015-04-01 DIAGNOSIS — N952 Postmenopausal atrophic vaginitis: Secondary | ICD-10-CM

## 2015-04-01 NOTE — Progress Notes (Signed)
April [name] 12/08/1934 [id number]        80 y.o.  G0P0 for breast and pelvic exam. Several issues noted below.  Past medical history,surgical history, problem list, medications, allergies, family history and social history were all reviewed and documented as reviewed in the EPIC chart.  ROS:  Performed with pertinent positives and negatives included in the history, assessment and plan.   Additional significant findings :  none   Exam: [name] Vitals:   04/01/15 1057  BP: 118/74  Height: 5' (1.524 m)  Weight: 105 lb (47.628 kg)   General appearance:  Normal affect, orientation and appearance. Skin: Grossly normal HEENT: Without gross lesions.  No cervical or supraclavicular adenopathy. Thyroid normal.  Lungs:  Clear without wheezing, rales or rhonchi Cardiac: RR, without RMG Abdominal:  Soft, nontender, without masses, guarding, rebound, organomegaly or hernia Chest wall:  Examined lying and sitting without masses or axillary adenopathy.  Status post bilateral mastectomy with well-healed mastectomy scars. Pelvic:  Ext/BUS/vagina with atrophic changes  Adnexa  Without masses or tenderness    Anus and perineum  Normal   Rectovaginal  Normal sphincter tone without palpated masses or tenderness.    Assessment/Plan:  80 y.o. G0P0 female for breast and pelvic exam.   1. Osteoporosis.  DEXA 05/2014 T score -3.1. Currently on Prolia 3 years. Plan repeat DEXA at 2 year interval and continue Prolia for now she is doing well with this. Increased calcium vitamin D reviewed. 2. Postmenopausal/atrophic genital changes. Status post hysterectomy in the past.  Doing well without significant hot flushes, night sweats or vaginal dryness. 3. History of breast cancer status post bilateral mastectomy. Exam any deep. Continue with self exams and annual provider exam. 4. Pap smear 2012. No Pap smear done today. No history of significant abnormal Pap smears. We both agreed to stop  screening based on her age and history of hysterectomy per current screening guidelines. 5. Colonoscopy 10 years ago. Has gene stool screening at her primary physician's office. They apparently have decided to stop screening with colonoscopy due to her age and lack of history of abnormalities. 6. Health maintenance. No routine blood work done as this is done at her primary physician's office. Follow up 1 year, sooner as needed.   [name] MD, 11:41 AM 04/01/2015

## 2015-04-01 NOTE — Patient Instructions (Signed)

## 2015-04-02 LAB — URINALYSIS W MICROSCOPIC + REFLEX CULTURE
BACTERIA UA: NONE SEEN [HPF]
Bilirubin Urine: NEGATIVE
CRYSTALS: NONE SEEN [HPF]
Casts: NONE SEEN [LPF]
GLUCOSE, UA: NEGATIVE
Hgb urine dipstick: NEGATIVE
Ketones, ur: NEGATIVE
LEUKOCYTES UA: NEGATIVE
Nitrite: NEGATIVE
PROTEIN: NEGATIVE
RBC / HPF: NONE SEEN RBC/HPF (ref <=2)
Specific Gravity, Urine: 1.009 (ref 1.001–1.035)
Squamous Epithelial / LPF: NONE SEEN [HPF] (ref <=5)
WBC, UA: NONE SEEN WBC/HPF (ref <=5)
Yeast: NONE SEEN [HPF]
pH: 6.5 (ref 5.0–8.0)

## 2015-04-19 ENCOUNTER — Telehealth: Payer: Self-pay | Admitting: Gynecology

## 2015-04-19 NOTE — Telephone Encounter (Signed)
Phone call to pt. PRolia due after 06/23/15, Last calcium 03/2014 will have labs drawn at Cancer center 04/28/2015. Insurance benefits are No deductible , Co insurance $40 with or without OV, OOP MAX $4000 ($156 met), . I will call her closer to Nov and set up Prolia appointment.

## 2015-04-21 ENCOUNTER — Encounter: Payer: Self-pay | Admitting: Gynecology

## 2015-04-26 ENCOUNTER — Telehealth: Payer: Self-pay | Admitting: Hematology and Oncology

## 2015-04-26 NOTE — Telephone Encounter (Signed)
s.w. pt and r/s appt to earlier time pt ok and aware of new time

## 2015-04-28 ENCOUNTER — Ambulatory Visit: Payer: Self-pay | Admitting: Hematology and Oncology

## 2015-04-28 ENCOUNTER — Ambulatory Visit (HOSPITAL_BASED_OUTPATIENT_CLINIC_OR_DEPARTMENT_OTHER): Payer: Medicare Other | Admitting: Hematology and Oncology

## 2015-04-28 ENCOUNTER — Encounter: Payer: Self-pay | Admitting: Hematology and Oncology

## 2015-04-28 ENCOUNTER — Other Ambulatory Visit: Payer: Self-pay

## 2015-04-28 ENCOUNTER — Telehealth: Payer: Self-pay | Admitting: Hematology and Oncology

## 2015-04-28 ENCOUNTER — Other Ambulatory Visit (HOSPITAL_BASED_OUTPATIENT_CLINIC_OR_DEPARTMENT_OTHER): Payer: Medicare Other

## 2015-04-28 VITALS — BP 164/78 | HR 74 | Temp 97.5°F | Resp 18 | Ht 60.0 in | Wt 107.1 lb

## 2015-04-28 DIAGNOSIS — D696 Thrombocytopenia, unspecified: Secondary | ICD-10-CM | POA: Diagnosis not present

## 2015-04-28 DIAGNOSIS — IMO0001 Reserved for inherently not codable concepts without codable children: Secondary | ICD-10-CM

## 2015-04-28 DIAGNOSIS — R030 Elevated blood-pressure reading, without diagnosis of hypertension: Secondary | ICD-10-CM | POA: Diagnosis not present

## 2015-04-28 DIAGNOSIS — Z299 Encounter for prophylactic measures, unspecified: Secondary | ICD-10-CM

## 2015-04-28 DIAGNOSIS — C9110 Chronic lymphocytic leukemia of B-cell type not having achieved remission: Secondary | ICD-10-CM

## 2015-04-28 DIAGNOSIS — Z23 Encounter for immunization: Secondary | ICD-10-CM | POA: Diagnosis not present

## 2015-04-28 LAB — COMPREHENSIVE METABOLIC PANEL (CC13)
ALBUMIN: 4 g/dL (ref 3.5–5.0)
ALK PHOS: 59 U/L (ref 40–150)
ALT: 21 U/L (ref 0–55)
AST: 22 U/L (ref 5–34)
Anion Gap: 5 mEq/L (ref 3–11)
BUN: 16.6 mg/dL (ref 7.0–26.0)
CO2: 26 meq/L (ref 22–29)
Calcium: 9.2 mg/dL (ref 8.4–10.4)
Chloride: 109 mEq/L (ref 98–109)
Creatinine: 0.8 mg/dL (ref 0.6–1.1)
EGFR: 69 mL/min/{1.73_m2} — AB (ref >90)
GLUCOSE: 94 mg/dL (ref 70–140)
POTASSIUM: 4.6 meq/L (ref 3.5–5.1)
SODIUM: 140 meq/L (ref 136–145)
TOTAL PROTEIN: 6.6 g/dL (ref 6.4–8.3)
Total Bilirubin: 0.83 mg/dL (ref 0.20–1.20)

## 2015-04-28 LAB — CBC WITH DIFFERENTIAL/PLATELET
BASO%: 0.1 % (ref 0.0–2.0)
Basophils Absolute: 0 10*3/uL (ref 0.0–0.1)
EOS%: 0.4 % (ref 0.0–7.0)
Eosinophils Absolute: 0.2 10*3/uL (ref 0.0–0.5)
HCT: 42 % (ref 34.8–46.6)
HGB: 13.6 g/dL (ref 11.6–15.9)
LYMPH%: 90.2 % — ABNORMAL HIGH (ref 14.0–49.7)
MCH: 33.3 pg (ref 25.1–34.0)
MCHC: 32.3 g/dL (ref 31.5–36.0)
MCV: 102.9 fL — ABNORMAL HIGH (ref 79.5–101.0)
MONO#: 1.5 10*3/uL — ABNORMAL HIGH (ref 0.1–0.9)
MONO%: 2.8 % (ref 0.0–14.0)
NEUT#: 3.6 10*3/uL (ref 1.5–6.5)
NEUT%: 6.5 % — ABNORMAL LOW (ref 38.4–76.8)
Platelets: 132 10*3/uL — ABNORMAL LOW (ref 145–400)
RBC: 4.08 10*6/uL (ref 3.70–5.45)
RDW: 13.7 % (ref 11.2–14.5)
WBC: 54.5 10*3/uL (ref 3.9–10.3)
lymph#: 49.2 10*3/uL — ABNORMAL HIGH (ref 0.9–3.3)

## 2015-04-28 LAB — TECHNOLOGIST REVIEW

## 2015-04-28 LAB — LACTATE DEHYDROGENASE (CC13): LDH: 169 U/L (ref 125–245)

## 2015-04-28 MED ORDER — INFLUENZA VAC SPLIT QUAD 0.5 ML IM SUSY
0.5000 mL | PREFILLED_SYRINGE | Freq: Once | INTRAMUSCULAR | Status: AC
  Administered 2015-04-28: 0.5 mL via INTRAMUSCULAR
  Filled 2015-04-28: qty 0.5

## 2015-04-28 NOTE — Progress Notes (Signed)
[facility] OFFICE PROGRESS NOTE  Patient Care Team: Marletta Lor, MD as PCP - General  SUMMARY OF ONCOLOGIC HISTORY:   Chronic lymphocytic leukemia   03/15/2009 Pathology Results Case #: [id number]  flow cytometry of peripheral blood comfirmed CLL.    INTERVAL HISTORY: Please see below for problem oriented charting.  she returns for further follow-up. She appears well. Denies recent infection. No new lymphadenopathy  REVIEW OF SYSTEMS:   Constitutional: Denies fevers, chills or abnormal weight loss Eyes: Denies blurriness of vision Ears, nose, mouth, throat, and face: Denies mucositis or sore throat Respiratory: Denies cough, dyspnea or wheezes Cardiovascular: Denies palpitation, chest discomfort or lower extremity swelling Gastrointestinal:  Denies nausea, heartburn or change in bowel habits Skin: Denies abnormal skin rashes Lymphatics: Denies new lymphadenopathy or easy bruising Neurological:Denies numbness, tingling or new weaknesses Behavioral/Psych: Mood is stable, no new changes  All other systems were reviewed with the patient and are negative.  I have reviewed the past medical history, past surgical history, social history and family history with the patient and they are unchanged from previous note.  ALLERGIES:  is allergic to codeine and levofloxacin.  MEDICATIONS:  Current Outpatient Prescriptions  Medication Sig Dispense Refill  . aspirin 81 MG tablet Take 81 mg by mouth daily.    . Beclomethasone Dipropionate (QVAR IN) Inhale into the lungs.    Marland Kitchen denosumab (PROLIA) 60 MG/ML SOLN injection Inject 60 mg into the skin every 6 (six) months. Administer in upper arm, thigh, or abdomen    . ibuprofen (ADVIL) 200 MG tablet Take 200 mg by mouth every 6 (six) hours as needed.      . nystatin-triamcinolone ointment (MYCOLOG) Apply topically 2 (two) times daily. 30 g 2  . terconazole (TERAZOL 7) 0.4 % vaginal cream INSERT ONE APPLICATOR VAGINALLY EVERY  2 WEEKS 45 g 1   No current facility-administered medications for this visit.    PHYSICAL EXAMINATION: ECOG PERFORMANCE STATUS: 0 - Asymptomatic  Filed Vitals:   04/28/15 1042  BP: 164/78  Pulse: 74  Temp: 97.5 F (36.4 C)  Resp: 18   Filed Weights   04/28/15 1042  Weight: 107 lb 1.6 oz (48.58 kg)    GENERAL:alert, no distress and comfortable. She is thin and cachectic SKIN: skin color, texture, turgor are normal, no rashes or significant lesions EYES: normal, Conjunctiva are pink and non-injected, sclera clear OROPHARYNX:no exudate, no erythema and lips, buccal mucosa, and tongue normal  NECK: supple, thyroid normal size, non-tender, without nodularity LYMPH:  She has palpable small lymph nodes in her neck region LUNGS: clear to auscultation and percussion with normal breathing effort HEART: regular rate & rhythm and no murmurs and no lower extremity edema ABDOMEN:abdomen soft, non-tender and normal bowel sounds Musculoskeletal:no cyanosis of digits and no clubbing  NEURO: alert & oriented x 3 with fluent speech, no focal motor/sensory deficits  LABORATORY DATA:  I have reviewed the data as listed    Component Value Date/Time   NA 140 04/28/2015 1028   NA 139 06/04/2011 1530   NA 140 04/13/2010 1133   K 4.6 04/28/2015 1028   K 4.4 06/04/2011 1530   K 4.6 04/13/2010 1133   CL 104 10/27/2012 1209   CL 104 06/04/2011 1530   CL 100 04/13/2010 1133   CO2 26 04/28/2015 1028   CO2 28 06/04/2011 1530   CO2 28 04/13/2010 1133   GLUCOSE 94 04/28/2015 1028   GLUCOSE 104* 10/27/2012 1209   GLUCOSE 105* 06/04/2011  1530   GLUCOSE 98 04/13/2010 1133   BUN 16.6 04/28/2015 1028   BUN 13 06/04/2011 1530   BUN 11 04/13/2010 1133   CREATININE 0.8 04/28/2015 1028   CREATININE 0.68 06/04/2011 1530   CREATININE 0.7 04/13/2010 1133   CALCIUM 9.2 04/28/2015 1028   CALCIUM 9.4 06/04/2011 1530   CALCIUM 9.2 04/13/2010 1133   PROT 6.6 04/28/2015 1028   PROT 6.5 06/04/2011 1530    PROT 7.1 04/13/2010 1133   ALBUMIN 4.0 04/28/2015 1028   ALBUMIN 3.5 06/04/2011 1530   AST 22 04/28/2015 1028   AST 24 06/04/2011 1530   AST 31 04/13/2010 1133   ALT 21 04/28/2015 1028   ALT 28 06/04/2011 1530   ALT 40 04/13/2010 1133   ALKPHOS 59 04/28/2015 1028   ALKPHOS 60 06/04/2011 1530   ALKPHOS 74 04/13/2010 1133   BILITOT 0.83 04/28/2015 1028   BILITOT 0.4 06/04/2011 1530   BILITOT 0.90 04/13/2010 1133   GFRNONAA 75.03 02/17/2009 1028   GFRAA  01/16/2009 1000    >60        The eGFR has been calculated using the MDRD equation. This calculation has not been validated in all clinical situations. eGFR's persistently <60 mL/min signify possible Chronic Kidney Disease.    No results found for: SPEP, UPEP  Lab Results  Component Value Date   WBC 54.5* 04/28/2015   NEUTROABS 3.6 04/28/2015   HGB 13.6 04/28/2015   HCT 42.0 04/28/2015   MCV 102.9* 04/28/2015   PLT 132* 04/28/2015      Chemistry      Component Value Date/Time   NA 140 04/28/2015 1028   NA 139 06/04/2011 1530   NA 140 04/13/2010 1133   K 4.6 04/28/2015 1028   K 4.4 06/04/2011 1530   K 4.6 04/13/2010 1133   CL 104 10/27/2012 1209   CL 104 06/04/2011 1530   CL 100 04/13/2010 1133   CO2 26 04/28/2015 1028   CO2 28 06/04/2011 1530   CO2 28 04/13/2010 1133   BUN 16.6 04/28/2015 1028   BUN 13 06/04/2011 1530   BUN 11 04/13/2010 1133   CREATININE 0.8 04/28/2015 1028   CREATININE 0.68 06/04/2011 1530   CREATININE 0.7 04/13/2010 1133      Component Value Date/Time   CALCIUM 9.2 04/28/2015 1028   CALCIUM 9.4 06/04/2011 1530   CALCIUM 9.2 04/13/2010 1133   ALKPHOS 59 04/28/2015 1028   ALKPHOS 60 06/04/2011 1530   ALKPHOS 74 04/13/2010 1133   AST 22 04/28/2015 1028   AST 24 06/04/2011 1530   AST 31 04/13/2010 1133   ALT 21 04/28/2015 1028   ALT 28 06/04/2011 1530   ALT 40 04/13/2010 1133   BILITOT 0.83 04/28/2015 1028   BILITOT 0.4 06/04/2011 1530   BILITOT 0.90 04/13/2010 1133        ASSESSMENT & PLAN:  Chronic lymphocytic leukemia  The lymphocyte doubling time is more than 1 year. This time, I noted a very mild thrombocytopenia She has no signs or symptoms to suggest disease progression. I plan on seeing her on a yearly basis only with history, physical examination and blood work.    Thrombocytopenia  Could be due to CLL. She is not symptomatic. Observe only.  White coat hypertension  Her blood pressure today is high.  I noted that her blood pressure during recent visits  with other physicians are within normal limits. This is likely whitecoat hypertension. Observe only.  Preventive measure We discussed the importance of  preventive care and reviewed the vaccination programs. She does not have any prior allergic reactions to influenza vaccination. She agrees to proceed with influenza vaccination today and we will administer it today at the clinic.    Orders Placed This Encounter  Procedures  . Comprehensive metabolic panel    Standing Status: Future     Number of Occurrences:      Standing Expiration Date: 06/01/2016  . CBC with Differential/Platelet    Standing Status: Future     Number of Occurrences:      Standing Expiration Date: 06/01/2016  . Lactate dehydrogenase    Standing Status: Future     Number of Occurrences:      Standing Expiration Date: 06/01/2016   All questions were answered. The patient knows to call the clinic with any problems, questions or concerns. No barriers to learning was detected. I spent 15 minutes counseling the patient face to face. The total time spent in the appointment was 20 minutes and more than 50% was on counseling and review of test results     [facility], [place], MD 04/28/2015 4:12 PM

## 2015-04-28 NOTE — Telephone Encounter (Signed)
Gave and printed appt sched and avs for pt for SEpt 2017

## 2015-04-28 NOTE — Assessment & Plan Note (Signed)
We discussed the importance of preventive care and reviewed the vaccination programs. She does not have any prior allergic reactions to influenza vaccination. She agrees to proceed with influenza vaccination today and we will administer it today at the clinic.  

## 2015-04-28 NOTE — Assessment & Plan Note (Signed)
Her blood pressure today is high.  I noted that her blood pressure during recent visits  with other physicians are within normal limits. This is likely whitecoat hypertension. Observe only.

## 2015-04-28 NOTE — Assessment & Plan Note (Signed)
The lymphocyte doubling time is more than 1 year. This time, I noted a very mild thrombocytopenia She has no signs or symptoms to suggest disease progression. I plan on seeing her on a yearly basis only with history, physical examination and blood work.

## 2015-04-28 NOTE — Assessment & Plan Note (Signed)
Could be due to CLL. She is not symptomatic. Observe only. 

## 2015-05-02 ENCOUNTER — Other Ambulatory Visit: Payer: Self-pay | Admitting: Gynecology

## 2015-05-06 NOTE — Telephone Encounter (Signed)
Calcium 9.2 04/28/15. KW CMA

## 2015-05-31 NOTE — Telephone Encounter (Signed)
PC to pt . Cost for pt $40 co insurance, insurance benefits 04/19/15 note, Calcium 9.2, Schedule prolia 06/29/15 at 10 30 nurse only

## 2015-06-29 ENCOUNTER — Ambulatory Visit (INDEPENDENT_AMBULATORY_CARE_PROVIDER_SITE_OTHER): Payer: Medicare Other | Admitting: *Deleted

## 2015-06-29 DIAGNOSIS — M810 Age-related osteoporosis without current pathological fracture: Secondary | ICD-10-CM | POA: Diagnosis not present

## 2015-06-29 MED ORDER — DENOSUMAB 60 MG/ML ~~LOC~~ SOLN
60.0000 mg | Freq: Once | SUBCUTANEOUS | Status: AC
  Administered 2015-06-29: 60 mg via SUBCUTANEOUS

## 2015-06-30 NOTE — Telephone Encounter (Signed)
Injection given 06/29/15 , next injection after 12/27/15

## 2015-11-22 ENCOUNTER — Other Ambulatory Visit: Payer: Self-pay | Admitting: Gynecology

## 2015-11-29 ENCOUNTER — Telehealth: Payer: Self-pay | Admitting: Gynecology

## 2015-11-29 ENCOUNTER — Encounter: Payer: Self-pay | Admitting: Gynecology

## 2015-11-29 NOTE — Telephone Encounter (Signed)
Prolia injection due after 12/27/15,  insurance No deductible, [employer] $40, Administration fee $40, Total for pt $80. Calcium 9.2 on 04/28/15  ,  Appointment on 01/09/16  At 11 30 with nurse only

## 2015-11-29 NOTE — Telephone Encounter (Signed)
prolia scheduled 01/09/16

## 2015-11-30 ENCOUNTER — Encounter: Payer: Self-pay | Admitting: Gynecology

## 2015-12-28 ENCOUNTER — Other Ambulatory Visit: Payer: Self-pay

## 2016-01-02 ENCOUNTER — Other Ambulatory Visit: Payer: Self-pay

## 2016-01-04 ENCOUNTER — Encounter: Payer: Self-pay | Admitting: Internal Medicine

## 2016-01-09 ENCOUNTER — Ambulatory Visit (INDEPENDENT_AMBULATORY_CARE_PROVIDER_SITE_OTHER): Payer: Medicare Other | Admitting: Gynecology

## 2016-01-09 DIAGNOSIS — M810 Age-related osteoporosis without current pathological fracture: Secondary | ICD-10-CM

## 2016-01-09 MED ORDER — DENOSUMAB 60 MG/ML ~~LOC~~ SOLN
60.0000 mg | Freq: Once | SUBCUTANEOUS | Status: AC
  Administered 2016-01-09: 60 mg via SUBCUTANEOUS

## 2016-01-10 NOTE — Telephone Encounter (Signed)
Next injection due after 07/11/16

## 2016-01-23 ENCOUNTER — Telehealth: Payer: Self-pay

## 2016-01-23 ENCOUNTER — Other Ambulatory Visit (INDEPENDENT_AMBULATORY_CARE_PROVIDER_SITE_OTHER): Payer: Medicare Other

## 2016-01-23 DIAGNOSIS — Z0000 Encounter for general adult medical examination without abnormal findings: Secondary | ICD-10-CM | POA: Diagnosis not present

## 2016-01-23 LAB — HEPATIC FUNCTION PANEL
ALBUMIN: 4.3 g/dL (ref 3.5–5.2)
ALT: 17 U/L (ref 0–35)
AST: 20 U/L (ref 0–37)
Alkaline Phosphatase: 54 U/L (ref 39–117)
BILIRUBIN DIRECT: 0.2 mg/dL (ref 0.0–0.3)
TOTAL PROTEIN: 6.9 g/dL (ref 6.0–8.3)
Total Bilirubin: 1 mg/dL (ref 0.2–1.2)

## 2016-01-23 LAB — LIPID PANEL
CHOL/HDL RATIO: 3
Cholesterol: 191 mg/dL (ref 0–200)
HDL: 55.9 mg/dL (ref >39.00)
LDL Cholesterol: 115 mg/dL — ABNORMAL HIGH (ref 0–99)
NONHDL: 135.18
TRIGLYCERIDES: 99 mg/dL (ref 0.0–149.0)
VLDL: 19.8 mg/dL (ref 0.0–40.0)

## 2016-01-23 LAB — CBC WITH DIFFERENTIAL/PLATELET
BASOS PCT: 0.4 % (ref 0.0–3.0)
Basophils Absolute: 0.3 10*3/uL — ABNORMAL HIGH (ref 0.0–0.1)
EOS PCT: 0.4 % (ref 0.0–5.0)
Eosinophils Absolute: 0.3 10*3/uL (ref 0.0–0.7)
HEMATOCRIT: 40.9 % (ref 36.0–46.0)
HEMOGLOBIN: 13.5 g/dL (ref 12.0–15.0)
LYMPHS ABS: 59.5 10*3/uL — AB (ref 0.7–4.0)
Lymphocytes Relative: 91 % — ABNORMAL HIGH (ref 12.0–46.0)
MCHC: 32.9 g/dL (ref 30.0–36.0)
MCV: 100.9 fl — AB (ref 78.0–100.0)
MONO ABS: 1.4 10*3/uL — AB (ref 0.1–1.0)
MONOS PCT: 2.2 % — AB (ref 3.0–12.0)
NEUTROS ABS: 4 10*3/uL (ref 1.4–7.7)
Neutrophils Relative %: 6 % — ABNORMAL LOW (ref 43.0–77.0)
Platelets: 140 10*3/uL — ABNORMAL LOW (ref 150.0–400.0)
RBC: 4.05 Mil/uL (ref 3.87–5.11)
RDW: 13.9 % (ref 11.5–15.5)
WBC: 65.4 10*3/uL (ref 4.0–10.5)

## 2016-01-23 LAB — POC URINALSYSI DIPSTICK (AUTOMATED)
Bilirubin, UA: NEGATIVE
Glucose, UA: NEGATIVE
Ketones, UA: NEGATIVE
Leukocytes, UA: NEGATIVE
NITRITE UA: NEGATIVE
PROTEIN UA: NEGATIVE
RBC UA: NEGATIVE
SPEC GRAV UA: 1.02
UROBILINOGEN UA: 0.2
pH, UA: 5.5

## 2016-01-23 LAB — BASIC METABOLIC PANEL
BUN: 16 mg/dL (ref 6–23)
CHLORIDE: 105 meq/L (ref 96–112)
CO2: 27 meq/L (ref 19–32)
Calcium: 9.5 mg/dL (ref 8.4–10.5)
Creatinine, Ser: 0.74 mg/dL (ref 0.40–1.20)
GFR: 80.56 mL/min (ref >60.00)
GLUCOSE: 93 mg/dL (ref 70–99)
POTASSIUM: 3.7 meq/L (ref 3.5–5.1)
SODIUM: 139 meq/L (ref 135–145)

## 2016-01-23 LAB — TSH: TSH: 2.52 u[IU]/mL (ref 0.35–4.50)

## 2016-01-23 NOTE — Telephone Encounter (Signed)
Critical Lab called by [facility] for this patient at 11:22am on 01/23/16. Patient has a WBC at 65.4.

## 2016-01-23 NOTE — Telephone Encounter (Signed)
Dr. Raliegh Ip aware, pt scheduled for physical on 7/3 next Monday.

## 2016-01-23 NOTE — Telephone Encounter (Signed)
cri 

## 2016-01-30 ENCOUNTER — Encounter: Payer: Self-pay | Admitting: Internal Medicine

## 2016-02-07 ENCOUNTER — Ambulatory Visit (INDEPENDENT_AMBULATORY_CARE_PROVIDER_SITE_OTHER): Payer: Medicare Other | Admitting: Internal Medicine

## 2016-02-07 ENCOUNTER — Encounter: Payer: Self-pay | Admitting: Internal Medicine

## 2016-02-07 VITALS — BP 152/80 | HR 85 | Temp 97.7°F | Ht 59.5 in | Wt 105.0 lb

## 2016-02-07 DIAGNOSIS — M159 Polyosteoarthritis, unspecified: Secondary | ICD-10-CM

## 2016-02-07 DIAGNOSIS — IMO0001 Reserved for inherently not codable concepts without codable children: Secondary | ICD-10-CM

## 2016-02-07 DIAGNOSIS — E785 Hyperlipidemia, unspecified: Secondary | ICD-10-CM

## 2016-02-07 DIAGNOSIS — C9110 Chronic lymphocytic leukemia of B-cell type not having achieved remission: Secondary | ICD-10-CM | POA: Diagnosis not present

## 2016-02-07 DIAGNOSIS — R030 Elevated blood-pressure reading, without diagnosis of hypertension: Secondary | ICD-10-CM | POA: Diagnosis not present

## 2016-02-07 DIAGNOSIS — M150 Primary generalized (osteo)arthritis: Secondary | ICD-10-CM | POA: Diagnosis not present

## 2016-02-07 DIAGNOSIS — Z0000 Encounter for general adult medical examination without abnormal findings: Secondary | ICD-10-CM

## 2016-02-07 NOTE — Progress Notes (Signed)
Subjective:    Patient ID: April [name], female    DOB: 1935-03-17, 81 y.o.   MRN: [id number]  HPI   Patient ID: April [name], female   DOB: Dec 11, 1934, 81 y.o.   MRN: [id number]  Subjective:    Patient ID: April [name], female    DOB: 09/14/1934, 81 y.o.   MRN: [id number]  HPI 74  and a year-old patient who is seen today for a preventive health examination.   She is followed by gynecology. She's had a recent gynecologic evaluation and does have a history of osteoporosis;  Fosamax therapy was discontinued approximately  5 Years ago at the time of her last bone density   and she remains on Prolia. 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 She does quite well takes when necessary Advil only. No new concerns or complaints. She has had some laboratory studies done throughout the year by  Hematology. She has a COPD and has Remained stable off maintenance ICS.  No rescue bronchodilator use.  .  Problems Prior to Update:  1) Osteoarthritis (ICD-715.90)  2) Venous Insufficiency, Chronic (ICD-459.81)  3) Uri (ICD-465.9)  4) Acute Pharyngitis (ICD-462)  5) COPD (ICD-496)  6) Skin Cancer, Hx of (ICD-V10.83)  7) Osteoporosis (ICD-733.00)  8) Hyperlipidemia (ICD-272.4)  9) Diarrhea, Acute (ICD-787.91)  10) Diverticulitis, Hx of (ICD-V12.79)  11) Family History of Asthma (ICD-V17.5)   Medications Prior to Update:  1) Terconazole 0.4 % Crea (Terconazole) .... As Dir   Allergies:  1) ! Levaquin   Past History:  Past Medical History:   Emphysema/Chronic Bronchitis  High Cholesterol  Blood transfusion  Diverticulitis, hx of  Hyperlipidemia  history breast cancer  Osteoporosis  Skin cancer, hx of  COPD  chronic venous insufficiency  Osteoarthritis  CLL  Past Surgical History:   Nasoseptoplasty 1971  Partial Removal of Vagina 1980 ([place])  Bil Reduction Mammoplasty 1982  Breast Reconstruction 1982  L elbow Reconstruction a 16  Removal of Breast Implants 2001    Appendectomy 1978  Hysterectomy 1978  Mastectomy  Bilateral  Tonsillectomy 1966  colonoscopy 2005 [facility])  L Salipingectomy 2007  Colectomy 2007  Inguinal herniorrhaphy  incisional hernia repair October 2007  resection basal cell cancer left main 2008   Family History:   Family History of Hodgkins  Family History of Parkinsons  Family History of Arthritis  Family History of Asthma  Family History of Cardiovascular disorder  Father died age 67: Renal failure  Mother died MI age 1 (first MI age 81)-osteoporosis  One brother died age late 37's- Parkinson's 70  Brother dies Hodgkin's disease mid 66's  Brother died accidental death age 67  Sister died newborn meningitis  One sister age 81: asthma, osteoporosis   Social History:   Retired  Married  Former Smoker  Alcohol use-yes  Drug use-no  Regular exercise-no  Medicare Wellness  1. Risk factors, based on past  M,S,F history- cardiovascular risk factors include a history of mild dyslipidemia.  2.  Physical activities: Fairly sedentary but no exercise restrictions.  Does gardening  3.  Depression/mood: No history of depression or mood disorder  4.  Hearing: No deficits  5.  ADL's: Independent in all aspects of daily living  6.  Fall risk: Low  7.  Home safety: No problems identified  8.  Height weight, and visual acuity;  9.  Counseling: Calcium vitamin D supplements encouraged more regular exercise recommended  10. Lab orders based on risk factors: Will  check a lipid profile and TSH  11. Referral : Follow GYN and oncology  12. Care plan: Followup bone density study in one year 13. Cognitive assessment: Alert and oriented with normal affect. No cognitive dysfunction. 14.  Preventive services will include annual gynecologic evaluations.  She has a history of osteoporosis and serial bone density studies will be considered.  It has been 10 years since her last colonoscopy and screening tests will be  discussed and entertained today.  She'll be seen for an annual exam and will be followed by oncology and ophthalmology on an annual basis 15.  Provider list update includes gynecology ophthalmology and oncology as well as dermatology  Past Medical History  Diagnosis Date  . Osteoporosis 05/31/2014     T score -3.1 stable at [facility] spine and left hip, statistically improved right hip from prior DEXA 2013  . 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 Leukemia, chronic lymphoid ([place])   . Breast CA ([place])   . Bronchitis, chronic ([facility])     Social History   Social History  . Marital Status: Married    Spouse Name: N/A  . Number of Children: N/A  . Years of Education: N/A   Occupational History  . Not on file.   Social History Main Topics  . Smoking status: Former Smoker    Types: Cigarettes    Quit date: 03/26/1955  . Smokeless tobacco: Never Used  . Alcohol Use: 3.0 oz/week    5 Standard drinks or equivalent per week     Comment: wine  . Drug Use: No  . Sexual Activity: No     Comment: HYST-1st intercourse 81 yo-More than 5 partners   Other Topics Concern  . Not on file   Social History Narrative    Past Surgical History  Procedure Laterality Date  . Abdominal hysterectomy  1980  . Mastectomy  1982    BILATERAL  . Breast implants removed  2001  . Partial colectomy      INTESTINAL ABSCESS WITH SALPINGECTOMY  . Umbillical hernia repair    . Reconstruction left elbow    . Oophorectomy      BSO  . Breast surgery      Bilateral mastectomy  . Tonsillectomy      Family History  Problem Relation Age of Onset  . Heart disease Mother   . Cancer Brother     HODGKINS  . Cancer Brother     leukemia  . Parkinson's disease Brother   . Heart disease Sister     Allergies  Allergen Reactions  . Codeine Nausea Only  . Levofloxacin Rash    Current Outpatient Prescriptions on File Prior to Visit  Medication Sig Dispense Refill  . aspirin 81 MG  tablet Take 81 mg by mouth daily.    . Beclomethasone Dipropionate (QVAR IN) Inhale into the lungs.    Marland Kitchen denosumab (PROLIA) 60 MG/ML SOLN injection Inject 60 mg into the skin every 6 (six) months. Administer in upper arm, thigh, or abdomen    . ibuprofen (ADVIL) 200 MG tablet Take 200 mg by mouth every 6 (six) hours as needed.      . nystatin-triamcinolone ointment (MYCOLOG) Apply topically 2 (two) times daily. 30 g 2  . terconazole (TERAZOL 7) 0.4 % vaginal cream INSERT ONE APPLICATOR VAGINALLY EVERY 2 WEEKS 45 g 3   No current facility-administered medications on file prior to visit.    BP 152/80 mmHg  Pulse  85  Temp(Src) 97.7 F (36.5 C) (Oral)  Ht 4' 11.5" (1.511 m)  Wt 105 lb (47.628 kg)  BMI 20.86 kg/m2  SpO2 97%  LMP 03/26/1979       Review of Systems  Constitutional: Negative for fever, appetite change, fatigue and unexpected weight change.  HENT: Negative for congestion, dental problem, ear pain, hearing loss, mouth sores, nosebleeds, sinus pressure, sore throat, tinnitus, trouble swallowing and voice change.   Eyes: Negative for photophobia, pain, redness and visual disturbance.  Respiratory: Negative for cough, chest tightness and shortness of breath.   Cardiovascular: Negative for chest pain, palpitations and leg swelling.  Gastrointestinal: Negative for nausea, vomiting, abdominal pain, diarrhea, constipation, blood in stool, abdominal distention and rectal pain.  Genitourinary: Negative for dysuria, urgency, frequency, hematuria, flank pain, vaginal bleeding, vaginal discharge, difficulty urinating, genital sores, vaginal pain, menstrual problem and pelvic pain.  Musculoskeletal: Negative for arthralgias, back pain and neck stiffness.  Skin: Negative for rash.  Neurological: Negative for dizziness, syncope, speech difficulty, weakness, light-headedness, numbness and headaches.  Hematological: Negative for adenopathy. Does not bruise/bleed easily.    Psychiatric/Behavioral: Negative for suicidal ideas, behavioral problems, self-injury, dysphoric mood and agitation. The patient is not nervous/anxious.    and  and and and a      Objective:   Physical Exam  Constitutional: She is oriented to person, place, and time. She appears well-developed and well-nourished.  HENT:  Head: Normocephalic and atraumatic.  Right Ear: External ear normal.  Left Ear: External ear normal.  Mouth/Throat: Oropharynx is clear and moist.  Few xanthelasma left inner upper lid  Eyes: Conjunctivae and EOM are normal.  Neck: Normal range of motion. Neck supple. No JVD present. No thyromegaly present.  Cardiovascular: Normal rate, regular rhythm, normal heart sounds and intact distal pulses.   No murmur heard. Pulmonary/Chest: Effort normal and breath sounds normal. She has no wheezes. She has no rales.  Bilateral mastectomies  Abdominal: Soft. Bowel sounds are normal. She exhibits no distension and no mass. There is no tenderness. There is no rebound and no guarding.  Lower midline scar  Musculoskeletal: Normal range of motion. She exhibits no edema and no tenderness.  Neurological: She is alert and oriented to person, place, and time. She has normal reflexes. No cranial nerve deficit. She exhibits normal muscle tone. Coordination normal.  Skin: Skin is warm and dry. No rash noted.  Psychiatric: She has a normal mood and affect. Her behavior is normal.          Assessment & Plan:   Preventive health.  Osteoporosis. Continue calcium and vitamin D supplements;  continue  Prolia CLL. Followup oncology  Recheck one year    Review of Systems  As above    Objective:   Physical Exam   As above      Assessment & Plan:   Preventive health Osteoporosis.  Continue vitamin D supplements, calcium in Prolia CLL.  Follow-up oncology in September  Laboratory studies reviewed No change in medical regimen Follow-up one year or as  needed  Nyoka Cowden, MD

## 2016-02-07 NOTE — Patient Instructions (Addendum)
It is important that you exercise regularly, at least 20 minutes 3 to 4 times per week.  If you develop chest pain or shortness of breath seek  medical attention.  Oncology follow-up as scheduled  Take a calcium supplement, plus [phone] units of vitamin D  Menopause is a normal process in which your reproductive ability comes to an end. This process happens gradually over a span of months to years, usually between the ages of 71 and 56. Menopause is complete when you have missed 12 consecutive menstrual periods. It is important to talk with your health care provider about some of the most common conditions that affect postmenopausal women, such as heart disease, cancer, and bone loss (osteoporosis). Adopting a healthy lifestyle and getting preventive care can help to promote your health and wellness. Those actions can also lower your chances of developing some of these common conditions. WHAT SHOULD I KNOW ABOUT MENOPAUSE? During menopause, you may experience a number of symptoms, such as:  Moderate-to-severe hot flashes.  Night sweats.  Decrease in sex drive.  Mood swings.  Headaches.  Tiredness.  Irritability.  Memory problems.  Insomnia. Choosing to treat or not to treat menopausal changes is an individual decision that you make with your health care provider. WHAT SHOULD I KNOW ABOUT HORMONE REPLACEMENT THERAPY AND SUPPLEMENTS? Hormone therapy products are effective for treating symptoms that are associated with menopause, such as hot flashes and night sweats. Hormone replacement carries certain risks, especially as you become older. If you are thinking about using estrogen or estrogen with progestin treatments, discuss the benefits and risks with your health care provider. WHAT SHOULD I KNOW ABOUT HEART DISEASE AND STROKE? Heart disease, heart attack, and stroke become more likely as you age. This may be due, in part, to the hormonal changes that your body experiences during  menopause. These can affect how your body processes dietary fats, triglycerides, and cholesterol. Heart attack and stroke are both medical emergencies. There are many things that you can do to help prevent heart disease and stroke:  Have your blood pressure checked at least every 1-2 years. High blood pressure causes heart disease and increases the risk of stroke.  If you are 21-83 years old, ask your health care provider if you should take aspirin to prevent a heart attack or a stroke.  Do not use any tobacco products, including cigarettes, chewing tobacco, or electronic cigarettes. If you need help quitting, ask your health care provider.  It is important to eat a healthy diet and maintain a healthy weight.  Be sure to include plenty of vegetables, fruits, low-fat dairy products, and lean protein.  Avoid eating foods that are high in solid fats, added sugars, or salt (sodium).  Get regular exercise. This is one of the most important things that you can do for your health.  Try to exercise for at least 150 minutes each week. The type of exercise that you do should increase your heart rate and make you sweat. This is known as moderate-intensity exercise.  Try to do strengthening exercises at least twice each week. Do these in addition to the moderate-intensity exercise.  Know your numbers.Ask your health care provider to check your cholesterol and your blood glucose. Continue to have your blood tested as directed by your health care provider. WHAT SHOULD I KNOW ABOUT CANCER SCREENING? There are several types of cancer. Take the following steps to reduce your risk and to catch any cancer development as early as possible. Breast Cancer  Practice breast self-awareness.  This means understanding how your breasts normally appear and feel.  It also means doing regular breast self-exams. Let your health care provider know about any changes, no matter how small.  If you are 4 or older, have  a clinician do a breast exam (clinical breast exam or CBE) every year. Depending on your age, family history, and medical history, it may be recommended that you also have a yearly breast X-ray (mammogram).  If you have a family history of breast cancer, talk with your health care provider about genetic screening.  If you are at high risk for breast cancer, talk with your health care provider about having an MRI and a mammogram every year.  Breast cancer (BRCA) gene test is recommended for women who have family members with BRCA-related cancers. Results of the assessment will determine the need for genetic counseling and BRCA1 and for BRCA2 testing. BRCA-related cancers include these types:  Breast. This occurs in males or females.  Ovarian.  Tubal. This may also be called fallopian tube cancer.  Cancer of the abdominal or pelvic lining (peritoneal cancer).  Prostate.  Pancreatic. Cervical, Uterine, and Ovarian Cancer Your health care provider may recommend that you be screened regularly for cancer of the pelvic organs. These include your ovaries, uterus, and vagina. This screening involves a pelvic exam, which includes checking for microscopic changes to the surface of your cervix (Pap test).  For women ages 21-65, health care providers may recommend a pelvic exam and a Pap test every three years. For women ages 19-65, they may recommend the Pap test and pelvic exam, combined with testing for human papilloma virus (HPV), every five years. Some types of HPV increase your risk of cervical cancer. Testing for HPV may also be done on women of any age who have unclear Pap test results.  Other health care providers may not recommend any screening for nonpregnant women who are considered low risk for pelvic cancer and have no symptoms. Ask your health care provider if a screening pelvic exam is right for you.  If you have had past treatment for cervical cancer or a condition that could lead to  cancer, you need Pap tests and screening for cancer for at least 20 years after your treatment. If Pap tests have been discontinued for you, your risk factors (such as having a new sexual partner) need to be reassessed to determine if you should start having screenings again. Some women have medical problems that increase the chance of getting cervical cancer. In these cases, your health care provider may recommend that you have screening and Pap tests more often.  If you have a family history of uterine cancer or ovarian cancer, talk with your health care provider about genetic screening.  If you have vaginal bleeding after reaching menopause, tell your health care provider.  There are currently no reliable tests available to screen for ovarian cancer. Lung Cancer Lung cancer screening is recommended for adults 67-31 years old who are at high risk for lung cancer because of a history of smoking. A yearly low-dose CT scan of the lungs is recommended if you:  Currently smoke.  Have a history of at least 30 pack-years of smoking and you currently smoke or have quit within the past 15 years. A pack-year is smoking an average of one pack of cigarettes per day for one year. Yearly screening should:  Continue until it has been 15 years since you quit.  Stop if you  develop a health problem that would prevent you from having lung cancer treatment. Colorectal Cancer  This type of cancer can be detected and can often be prevented.  Routine colorectal cancer screening usually begins at age 27 and continues through age 64.  If you have risk factors for colon cancer, your health care provider may recommend that you be screened at an earlier age.  If you have a family history of colorectal cancer, talk with your health care provider about genetic screening.  Your health care provider may also recommend using home test kits to check for hidden blood in your stool.  A small camera at the end of a tube  can be used to examine your colon directly (sigmoidoscopy or colonoscopy). This is done to check for the earliest forms of colorectal cancer.  Direct examination of the colon should be repeated every 5-10 years until age 70. However, if early forms of precancerous polyps or small growths are found or if you have a family history or genetic risk for colorectal cancer, you may need to be screened more often. Skin Cancer  Check your skin from head to toe regularly.  Monitor any moles. Be sure to tell your health care provider:  About any new moles or changes in moles, especially if there is a change in a mole's shape or color.  If you have a mole that is larger than the size of a pencil eraser.  If any of your family members has a history of skin cancer, especially at a young age, talk with your health care provider about genetic screening.  Always use sunscreen. Apply sunscreen liberally and repeatedly throughout the day.  Whenever you are outside, protect yourself by wearing long sleeves, pants, a wide-brimmed hat, and sunglasses. WHAT SHOULD I KNOW ABOUT OSTEOPOROSIS? Osteoporosis is a condition in which bone destruction happens more quickly than new bone creation. After menopause, you may be at an increased risk for osteoporosis. To help prevent osteoporosis or the bone fractures that can happen because of osteoporosis, the following is recommended:  If you are 41-53 years old, get at least 1,000 mg of calcium and at least 600 mg of vitamin D per day.  If you are older than age 79 but younger than age 20, get at least 1,200 mg of calcium and at least 600 mg of vitamin D per day.  If you are older than age 101, get at least 1,200 mg of calcium and at least 800 mg of vitamin D per day. Smoking and excessive alcohol intake increase the risk of osteoporosis. Eat foods that are rich in calcium and vitamin D, and do weight-bearing exercises several times each week as directed by your health care  provider. WHAT SHOULD I KNOW ABOUT HOW MENOPAUSE AFFECTS [place]? Depression may occur at any age, but it is more common as you become older. Common symptoms of depression include:  Low or sad mood.  Changes in sleep patterns.  Changes in appetite or eating patterns.  Feeling an overall lack of motivation or enjoyment of activities that you previously enjoyed.  Frequent crying spells. Talk with your health care provider if you think that you are experiencing depression. WHAT SHOULD I KNOW ABOUT IMMUNIZATIONS? It is important that you get and maintain your immunizations. These include:  Tetanus, diphtheria, and pertussis (Tdap) booster vaccine.  Influenza every year before the flu season begins.  Pneumonia vaccine.  Shingles vaccine. Your health care provider may also recommend other immunizations.  This information is not intended to replace advice given to you by your health care provider. Make sure you discuss any questions you have with your health care provider.   Document Released: 09/07/2005 Document Revised: 08/06/2014 Document Reviewed: 03/18/2014 Elsevier Interactive Patient Education [employer].

## 2016-02-07 NOTE — Progress Notes (Signed)
Pre visit review using our clinic review tool, if applicable. No additional management support is needed unless otherwise documented below in the visit note. 

## 2016-04-04 ENCOUNTER — Ambulatory Visit (INDEPENDENT_AMBULATORY_CARE_PROVIDER_SITE_OTHER): Payer: Medicare Other | Admitting: Gynecology

## 2016-04-04 ENCOUNTER — Encounter: Payer: Self-pay | Admitting: Gynecology

## 2016-04-04 VITALS — BP 122/74 | Ht 60.0 in | Wt 104.0 lb

## 2016-04-04 DIAGNOSIS — Z01419 Encounter for gynecological examination (general) (routine) without abnormal findings: Secondary | ICD-10-CM

## 2016-04-04 DIAGNOSIS — M810 Age-related osteoporosis without current pathological fracture: Secondary | ICD-10-CM | POA: Diagnosis not present

## 2016-04-04 DIAGNOSIS — C50919 Malignant neoplasm of unspecified site of unspecified female breast: Secondary | ICD-10-CM

## 2016-04-04 DIAGNOSIS — N952 Postmenopausal atrophic vaginitis: Secondary | ICD-10-CM | POA: Diagnosis not present

## 2016-04-04 NOTE — Patient Instructions (Signed)
Follow-up for the bone density as scheduled. 

## 2016-04-04 NOTE — Progress Notes (Signed)
    MARAJADE CAROUTHERS Apr 03, 1935 [id number]        81 y.o.  G0P0  for breast and pelvic exam. Several issues noted below  Past medical history,surgical history, problem list, medications, allergies, family history and social history were all reviewed and documented as reviewed in the EPIC chart.  ROS:  Performed with pertinent positives and negatives included in the history, assessment and plan.   Additional significant findings :  None   Exam: [name] assistant Vitals:   04/04/16 1014  BP: 122/74  Weight: 104 lb (47.2 kg)  Height: 5' (1.524 m)   Body mass index is 20.31 kg/m.  General appearance:  Normal affect, orientation and appearance. Skin: Grossly normal HEENT: Without gross lesions.  No cervical or supraclavicular adenopathy. Thyroid normal.  Lungs:  Clear without wheezing, rales or rhonchi Cardiac: RR, without RMG Abdominal:  Soft, nontender, without masses, guarding, rebound, organomegaly or hernia Chest wall:  Examined lying and sitting without masses or axillary adenopathy. Pelvic:  Ext/BUS/Vagina with atrophic changes  Adnexa without masses or tenderness    Anus and perineum normal   Rectovaginal normal sphincter tone without palpated masses or tenderness.    Assessment/Plan:  81 y.o. G0P0 female for breast and pelvic exam.  1. Postmenopausal/atrophic genital changes. Status post hysterectomy in the past. Without significant hot flushes, night sweats or vaginal dryness. Continue to monitor report any issues. 2. Osteoporosis. DEXA 05/2014 T score -3.1. Currently on Prolia for 4 years. Schedule DEXA now a 2 year interval. Continue Prolia one more year at 5 years assuming study shows improvement and then plan drug-free holiday. Patient will schedule follow up for this. 3. Pap smear reported 2012 and Epic.  No Pap smear done today. Per current screening guidelines we both agree to stop screening with no history of significant abnormal Pap smears, status post  hysterectomy and over the age 16. 60. Colonoscopy 10 years ago. Does stool screening through her primary physician's office. Will continue to follow up with them in reference to this. 5. History of breast cancer status post bilateral mastectomies. Exam NED. Continue with self exams and annual provider exams. 6. Health maintenance. No routine lab work done as patient does this elsewhere. Follow up for DEXA otherwise 1 year, sooner if any issues.   [name] MD, 10:31 AM 04/04/2016

## 2016-04-24 ENCOUNTER — Other Ambulatory Visit (HOSPITAL_BASED_OUTPATIENT_CLINIC_OR_DEPARTMENT_OTHER): Payer: Medicare Other

## 2016-04-24 ENCOUNTER — Telehealth: Payer: Self-pay | Admitting: Hematology and Oncology

## 2016-04-24 ENCOUNTER — Encounter: Payer: Self-pay | Admitting: Hematology and Oncology

## 2016-04-24 ENCOUNTER — Ambulatory Visit (HOSPITAL_BASED_OUTPATIENT_CLINIC_OR_DEPARTMENT_OTHER): Payer: Medicare Other | Admitting: Hematology and Oncology

## 2016-04-24 DIAGNOSIS — C9110 Chronic lymphocytic leukemia of B-cell type not having achieved remission: Secondary | ICD-10-CM | POA: Diagnosis not present

## 2016-04-24 DIAGNOSIS — D696 Thrombocytopenia, unspecified: Secondary | ICD-10-CM

## 2016-04-24 DIAGNOSIS — Z23 Encounter for immunization: Secondary | ICD-10-CM

## 2016-04-24 DIAGNOSIS — R030 Elevated blood-pressure reading, without diagnosis of hypertension: Secondary | ICD-10-CM | POA: Diagnosis not present

## 2016-04-24 DIAGNOSIS — IMO0001 Reserved for inherently not codable concepts without codable children: Secondary | ICD-10-CM

## 2016-04-24 LAB — CBC WITH DIFFERENTIAL/PLATELET
BASO%: 0.2 % (ref 0.0–2.0)
Basophils Absolute: 0.1 10*3/uL (ref 0.0–0.1)
EOS%: 0.3 % (ref 0.0–7.0)
Eosinophils Absolute: 0.2 10*3/uL (ref 0.0–0.5)
HCT: 42.8 % (ref 34.8–46.6)
HGB: 14.1 g/dL (ref 11.6–15.9)
LYMPH%: 92.8 % — AB (ref 14.0–49.7)
MCH: 33.3 pg (ref 25.1–34.0)
MCHC: 32.9 g/dL (ref 31.5–36.0)
MCV: 101.2 fL — ABNORMAL HIGH (ref 79.5–101.0)
MONO#: 1 10*3/uL — ABNORMAL HIGH (ref 0.1–0.9)
MONO%: 1.5 % (ref 0.0–14.0)
NEUT#: 3.5 10*3/uL (ref 1.5–6.5)
NEUT%: 5.2 % — AB (ref 38.4–76.8)
Platelets: 134 10*3/uL — ABNORMAL LOW (ref 145–400)
RBC: 4.23 10*6/uL (ref 3.70–5.45)
RDW: 13.9 % (ref 11.2–14.5)
WBC: 65.7 10*3/uL (ref 3.9–10.3)
lymph#: 61 10*3/uL — ABNORMAL HIGH (ref 0.9–3.3)
nRBC: 0 % (ref 0–0)

## 2016-04-24 LAB — COMPREHENSIVE METABOLIC PANEL
ALBUMIN: 4 g/dL (ref 3.5–5.0)
ALK PHOS: 65 U/L (ref 40–150)
ALT: 18 U/L (ref 0–55)
ANION GAP: 8 meq/L (ref 3–11)
AST: 23 U/L (ref 5–34)
BUN: 16.2 mg/dL (ref 7.0–26.0)
CO2: 25 meq/L (ref 22–29)
Calcium: 9.4 mg/dL (ref 8.4–10.4)
Chloride: 106 mEq/L (ref 98–109)
Creatinine: 0.8 mg/dL (ref 0.6–1.1)
EGFR: 69 mL/min/{1.73_m2} — AB (ref >90)
GLUCOSE: 91 mg/dL (ref 70–140)
POTASSIUM: 4.2 meq/L (ref 3.5–5.1)
SODIUM: 140 meq/L (ref 136–145)
Total Bilirubin: 1.03 mg/dL (ref 0.20–1.20)
Total Protein: 7.1 g/dL (ref 6.4–8.3)

## 2016-04-24 LAB — TECHNOLOGIST REVIEW

## 2016-04-24 LAB — LACTATE DEHYDROGENASE: LDH: 169 U/L (ref 125–245)

## 2016-04-24 MED ORDER — INFLUENZA VAC SPLIT QUAD 0.5 ML IM SUSY
0.5000 mL | PREFILLED_SYRINGE | Freq: Once | INTRAMUSCULAR | Status: AC
  Administered 2016-04-24: 0.5 mL via INTRAMUSCULAR
  Filled 2016-04-24: qty 0.5

## 2016-04-24 NOTE — Assessment & Plan Note (Signed)
Could be due to CLL. She is not symptomatic. Observe only. 

## 2016-04-24 NOTE — Progress Notes (Signed)
[place] OFFICE PROGRESS NOTE  Patient Care Team: Marletta Lor, MD as PCP - General  SUMMARY OF ONCOLOGIC HISTORY:   Chronic lymphocytic leukemia ([place])   03/15/2009 Pathology Results    Case #: [id number]  flow cytometry of peripheral blood comfirmed CLL.       April [name] was transferred to my care after her prior physician has left.  I reviewed the patient's records extensive and collaborated the history with the patient. Summary of her history is as follows: This is a pleasant woman with stage 0 chronic lymphocytic leukemia not requiring any specific treatment so far. She has a slow white count doubling time and was observed  INTERVAL HISTORY: Please see below for problem oriented charting. She feels well. No new lymphadenopathy. Denies recent infection. The patient denies any recent signs or symptoms of bleeding such as spontaneous epistaxis, hematuria or hematochezia.   REVIEW OF SYSTEMS:   Constitutional: Denies fevers, chills or abnormal weight loss Eyes: Denies blurriness of vision Ears, nose, mouth, throat, and face: Denies mucositis or sore throat Respiratory: Denies cough, dyspnea or wheezes Cardiovascular: Denies palpitation, chest discomfort or lower extremity swelling Gastrointestinal:  Denies nausea, heartburn or change in bowel habits Skin: Denies abnormal skin rashes Lymphatics: Denies new lymphadenopathy  Neurological:Denies numbness, tingling or new weaknesses Behavioral/Psych: Mood is stable, no new changes  All other systems were reviewed with the patient and are negative.  I have reviewed the past medical history, past surgical history, social history and family history with the patient and they are unchanged from previous note.  ALLERGIES:  is allergic to codeine and levofloxacin.  MEDICATIONS:  Current Outpatient Prescriptions  Medication Sig Dispense Refill  . aspirin 81 MG tablet Take 81 mg by mouth daily.    .  calcium-vitamin D (OSCAL WITH D) 500-200 MG-UNIT tablet Take 1 tablet by mouth 2 (two) times daily.    Marland Kitchen denosumab (PROLIA) 60 MG/ML SOLN injection Inject 60 mg into the skin every 6 (six) months. Administer in upper arm, thigh, or abdomen    . ibuprofen (ADVIL) 200 MG tablet Take 200 mg by mouth every 6 (six) hours as needed.      . nystatin cream (MYCOSTATIN) Apply 1 application topically 2 (two) times daily.    Marland Kitchen terconazole (TERAZOL 7) 0.4 % vaginal cream Place 1 applicator vaginally at bedtime.     No current facility-administered medications for this visit.     PHYSICAL EXAMINATION: ECOG PERFORMANCE STATUS: 1 - Symptomatic but completely ambulatory  Vitals:   04/24/16 1100  BP: (!) 155/80  Pulse: 70  Resp: 18  Temp: 97.7 F (36.5 C)   Filed Weights   04/24/16 1100  Weight: 104 lb 6.4 oz (47.4 kg)    GENERAL:alert, no distress and comfortable SKIN: skin color, texture, turgor are normal, no rashes or significant lesions EYES: normal, Conjunctiva are pink and non-injected, sclera clear OROPHARYNX:no exudate, no erythema and lips, buccal mucosa, and tongue normal  NECK: supple, thyroid normal size, non-tender, without nodularity LYMPH:  no palpable lymphadenopathy in the cervical, axillary or inguinal LUNGS: clear to auscultation and percussion with normal breathing effort HEART: regular rate & rhythm and no murmurs and no lower extremity edema ABDOMEN:abdomen soft, non-tender and normal bowel sounds Musculoskeletal:no cyanosis of digits and no clubbing  NEURO: alert & oriented x 3 with fluent speech, no focal motor/sensory deficits  LABORATORY DATA:  I have reviewed the data as listed    Component Value Date/Time  NA 140 04/24/2016 1028   K 4.2 04/24/2016 1028   CL 105 01/23/2016 0824   CL 104 10/27/2012 1209   CO2 25 04/24/2016 1028   GLUCOSE 91 04/24/2016 1028   GLUCOSE 104 (H) 10/27/2012 1209   BUN 16.2 04/24/2016 1028   CREATININE 0.8 04/24/2016 1028    CALCIUM 9.4 04/24/2016 1028   PROT 7.1 04/24/2016 1028   ALBUMIN 4.0 04/24/2016 1028   AST 23 04/24/2016 1028   ALT 18 04/24/2016 1028   ALKPHOS 65 04/24/2016 1028   BILITOT 1.03 04/24/2016 1028   GFRNONAA 75.03 02/17/2009 1028   GFRAA  01/16/2009 1000    >60        The eGFR has been calculated using the MDRD equation. This calculation has not been validated in all clinical situations. eGFR's persistently <60 mL/min signify possible Chronic Kidney Disease.    No results found for: SPEP, UPEP  Lab Results  Component Value Date   WBC 65.7 (HH) 04/24/2016   NEUTROABS 3.5 04/24/2016   HGB 14.1 04/24/2016   HCT 42.8 04/24/2016   MCV 101.2 (H) 04/24/2016   PLT 134 (L) 04/24/2016      Chemistry      Component Value Date/Time   NA 140 04/24/2016 1028   K 4.2 04/24/2016 1028   CL 105 01/23/2016 0824   CL 104 10/27/2012 1209   CO2 25 04/24/2016 1028   BUN 16.2 04/24/2016 1028   CREATININE 0.8 04/24/2016 1028      Component Value Date/Time   CALCIUM 9.4 04/24/2016 1028   ALKPHOS 65 04/24/2016 1028   AST 23 04/24/2016 1028   ALT 18 04/24/2016 1028   BILITOT 1.03 04/24/2016 1028     ASSESSMENT & PLAN:  Chronic lymphocytic leukemia The lymphocyte doubling time is more than 1 year. She has mild, persistent thrombocytopenia She has no signs or symptoms to suggest disease progression. I warned her about signs and symptoms to watch out for disease progression such as progressive fatigue, lymphadenopathy, anorexia or weight loss I plan on seeing her on a yearly basis only with history, physical examination and blood work.  I reinforced the importance of anual influenza vaccination.   Thrombocytopenia  Could be due to CLL. She is not symptomatic. Observe only.  White coat hypertension  Her blood pressure today is high.  This is likely whitecoat hypertension. Observe only.  We discussed the importance of preventive care and reviewed the vaccination programs. She does not  have any prior allergic reactions to influenza vaccination. She agrees to proceed with influenza vaccination today and we will administer it today at the clinic.   Orders Placed This Encounter  Procedures  . CBC with Differential/Platelet    Standing Status:   Future    Standing Expiration Date:   05/29/2017   All questions were answered. The patient knows to call the clinic with any problems, questions or concerns. No barriers to learning was detected. I spent 15 minutes counseling the patient face to face. The total time spent in the appointment was 20 minutes and more than 50% was on counseling and review of test results     Heath Lark, MD 04/24/2016 12:05 PM

## 2016-04-24 NOTE — Assessment & Plan Note (Signed)
Her blood pressure today is high.  This is likely whitecoat hypertension. Observe only.

## 2016-04-24 NOTE — Assessment & Plan Note (Addendum)
The lymphocyte doubling time is more than 1 year. She has mild, persistent thrombocytopenia She has no signs or symptoms to suggest disease progression. I warned her about signs and symptoms to watch out for disease progression such as progressive fatigue, lymphadenopathy, anorexia or weight loss I plan on seeing her on a yearly basis only with history, physical examination and blood work.  I reinforced the importance of anual influenza vaccination.

## 2016-04-24 NOTE — Telephone Encounter (Signed)
Gv pt appt for 04/25/17.

## 2016-06-05 ENCOUNTER — Ambulatory Visit (INDEPENDENT_AMBULATORY_CARE_PROVIDER_SITE_OTHER): Payer: Medicare Other

## 2016-06-05 DIAGNOSIS — M810 Age-related osteoporosis without current pathological fracture: Secondary | ICD-10-CM

## 2016-06-07 ENCOUNTER — Encounter: Payer: Self-pay | Admitting: Gynecology

## 2016-06-20 ENCOUNTER — Telehealth: Payer: Self-pay | Admitting: Gynecology

## 2016-07-10 NOTE — Telephone Encounter (Signed)
Prolia due after 07/11/16.  Appointment made 07/16/16 at 1130, Complete exam TF 03/2016. Calcium 9.4  04/24/16  . No deductible, Co pay $40 with OV  Nurse only visit, Co ins  $40 , OOPM $4000 (314 met), No PA .

## 2016-07-11 ENCOUNTER — Encounter: Payer: Self-pay | Admitting: Gynecology

## 2016-07-16 ENCOUNTER — Ambulatory Visit (INDEPENDENT_AMBULATORY_CARE_PROVIDER_SITE_OTHER): Payer: Medicare Other | Admitting: Gynecology

## 2016-07-16 DIAGNOSIS — M810 Age-related osteoporosis without current pathological fracture: Secondary | ICD-10-CM | POA: Diagnosis not present

## 2016-07-16 MED ORDER — DENOSUMAB 60 MG/ML ~~LOC~~ SOLN
60.0000 mg | Freq: Once | SUBCUTANEOUS | Status: AC
  Administered 2016-07-16: 60 mg via SUBCUTANEOUS

## 2016-07-17 ENCOUNTER — Other Ambulatory Visit: Payer: Self-pay | Admitting: Gynecology

## 2016-07-17 NOTE — Telephone Encounter (Signed)
Prolia given 07/16/16  Next injection due after 01/15/17

## 2016-10-16 ENCOUNTER — Telehealth: Payer: Self-pay | Admitting: Internal Medicine

## 2016-10-16 NOTE — Telephone Encounter (Signed)
Spoke with Dr Hampton Abbot pt's eye doctor. Pt has to be referred out to see a retina specialist. Dr Romilda Garret would like if Dr Raliegh Ip would order some lab work and she will fax over the orders.

## 2016-10-17 ENCOUNTER — Encounter (INDEPENDENT_AMBULATORY_CARE_PROVIDER_SITE_OTHER): Payer: Medicare Other | Admitting: Ophthalmology

## 2016-10-17 ENCOUNTER — Telehealth: Payer: Self-pay | Admitting: *Deleted

## 2016-10-17 DIAGNOSIS — I10 Essential (primary) hypertension: Secondary | ICD-10-CM | POA: Diagnosis not present

## 2016-10-17 DIAGNOSIS — H43813 Vitreous degeneration, bilateral: Secondary | ICD-10-CM

## 2016-10-17 DIAGNOSIS — H34812 Central retinal vein occlusion, left eye, with macular edema: Secondary | ICD-10-CM

## 2016-10-17 DIAGNOSIS — H35033 Hypertensive retinopathy, bilateral: Secondary | ICD-10-CM

## 2016-10-17 NOTE — Telephone Encounter (Signed)
"  Lattie Haw with Dr.John Zigmund Daniel calling about a mutual patient.  He would like to speak with Dr.Gorsuch about this patient." Provided [facility] provider's mobile number to [place].   "Please ask her to call Dr. Zigmund Daniel mobile number [phone].  He'll call her tomorrow if no return call received."

## 2016-10-18 ENCOUNTER — Other Ambulatory Visit: Payer: Self-pay | Admitting: Hematology and Oncology

## 2016-10-18 ENCOUNTER — Telehealth: Payer: Self-pay | Admitting: Hematology and Oncology

## 2016-10-18 DIAGNOSIS — H348192 Central retinal vein occlusion, unspecified eye, stable: Secondary | ICD-10-CM

## 2016-10-18 DIAGNOSIS — C9110 Chronic lymphocytic leukemia of B-cell type not having achieved remission: Secondary | ICD-10-CM

## 2016-10-18 NOTE — Telephone Encounter (Signed)
I discussed with Dr. Rodman Key who called me regarding the patient's acute onset of visual changes. He is concerned about possibility of significant disease progression from her CLL. I will schedule return blood work in appointment to see her next week for further evaluation.

## 2016-10-19 ENCOUNTER — Telehealth: Payer: Self-pay | Admitting: Hematology and Oncology

## 2016-10-19 NOTE — Telephone Encounter (Signed)
Called patient to inform her of next scheduled appointments. Spoke with patient.

## 2016-10-26 ENCOUNTER — Ambulatory Visit (HOSPITAL_BASED_OUTPATIENT_CLINIC_OR_DEPARTMENT_OTHER): Payer: Medicare Other | Admitting: Hematology and Oncology

## 2016-10-26 ENCOUNTER — Other Ambulatory Visit (HOSPITAL_BASED_OUTPATIENT_CLINIC_OR_DEPARTMENT_OTHER): Payer: Medicare Other

## 2016-10-26 ENCOUNTER — Encounter: Payer: Self-pay | Admitting: Hematology and Oncology

## 2016-10-26 DIAGNOSIS — C9110 Chronic lymphocytic leukemia of B-cell type not having achieved remission: Secondary | ICD-10-CM

## 2016-10-26 DIAGNOSIS — D696 Thrombocytopenia, unspecified: Secondary | ICD-10-CM

## 2016-10-26 DIAGNOSIS — H348192 Central retinal vein occlusion, unspecified eye, stable: Secondary | ICD-10-CM

## 2016-10-26 DIAGNOSIS — H539 Unspecified visual disturbance: Secondary | ICD-10-CM

## 2016-10-26 DIAGNOSIS — R030 Elevated blood-pressure reading, without diagnosis of hypertension: Secondary | ICD-10-CM | POA: Diagnosis not present

## 2016-10-26 LAB — CBC WITH DIFFERENTIAL/PLATELET
BASO%: 0.2 % (ref 0.0–2.0)
Basophils Absolute: 0.2 10*3/uL — ABNORMAL HIGH (ref 0.0–0.1)
EOS%: 0.3 % (ref 0.0–7.0)
Eosinophils Absolute: 0.2 10*3/uL (ref 0.0–0.5)
HCT: 41.6 % (ref 34.8–46.6)
HEMOGLOBIN: 13.6 g/dL (ref 11.6–15.9)
LYMPH#: 67 10*3/uL — AB (ref 0.9–3.3)
LYMPH%: 92.4 % — ABNORMAL HIGH (ref 14.0–49.7)
MCH: 33.3 pg (ref 25.1–34.0)
MCHC: 32.7 g/dL (ref 31.5–36.0)
MCV: 101.7 fL — ABNORMAL HIGH (ref 79.5–101.0)
MONO#: 1.2 10*3/uL — ABNORMAL HIGH (ref 0.1–0.9)
MONO%: 1.7 % (ref 0.0–14.0)
NEUT%: 5.4 % — ABNORMAL LOW (ref 38.4–76.8)
NEUTROS ABS: 3.9 10*3/uL (ref 1.5–6.5)
NRBC: 0 % (ref 0–0)
Platelets: 122 10*3/uL — ABNORMAL LOW (ref 145–400)
RBC: 4.09 10*6/uL (ref 3.70–5.45)
RDW: 13.6 % (ref 11.2–14.5)
WBC: 72.4 10*3/uL — AB (ref 3.9–10.3)

## 2016-10-26 LAB — COMPREHENSIVE METABOLIC PANEL
ALT: 18 U/L (ref 0–55)
AST: 21 U/L (ref 5–34)
Albumin: 4.2 g/dL (ref 3.5–5.0)
Alkaline Phosphatase: 68 U/L (ref 40–150)
Anion Gap: 8 mEq/L (ref 3–11)
BILIRUBIN TOTAL: 0.75 mg/dL (ref 0.20–1.20)
BUN: 15.8 mg/dL (ref 7.0–26.0)
CO2: 25 meq/L (ref 22–29)
CREATININE: 0.8 mg/dL (ref 0.6–1.1)
Calcium: 9.6 mg/dL (ref 8.4–10.4)
Chloride: 107 mEq/L (ref 98–109)
EGFR: 71 mL/min/{1.73_m2} — ABNORMAL LOW (ref >90)
GLUCOSE: 95 mg/dL (ref 70–140)
Potassium: 4.3 mEq/L (ref 3.5–5.1)
SODIUM: 140 meq/L (ref 136–145)
TOTAL PROTEIN: 7 g/dL (ref 6.4–8.3)

## 2016-10-26 LAB — TECHNOLOGIST REVIEW

## 2016-10-27 DIAGNOSIS — H539 Unspecified visual disturbance: Secondary | ICD-10-CM | POA: Insufficient documentation

## 2016-10-27 LAB — IGG, IGA, IGM
IGA/IMMUNOGLOBULIN A, SERUM: 139 mg/dL (ref 64–422)
IGG (IMMUNOGLOBIN G), SERUM: 834 mg/dL (ref 700–1600)
IgM, Qn, Serum: 25 mg/dL — ABNORMAL LOW (ref 26–217)

## 2016-10-27 LAB — APTT: APTT: 26 s (ref 24–33)

## 2016-10-27 LAB — PROTHROMBIN TIME (PT)
INR: 1 (ref 0.8–1.2)
Prothrombin Time: 10.8 s (ref 9.1–12.0)

## 2016-10-27 NOTE — Progress Notes (Signed)
[place] OFFICE PROGRESS NOTE  Patient Care Team: Marletta Lor, MD as PCP - General  SUMMARY OF ONCOLOGIC HISTORY:   Chronic lymphocytic leukemia ([place])   03/15/2009 Pathology Results    Case #: [id number]  flow cytometry of peripheral blood comfirmed CLL.     The patient was followed closely for diagnosis of CLL.  She is on observation only  INTERVAL HISTORY: Please see below for problem oriented charting.   I received a telephone call from a retinal specialist recently pertaining to her visual change According to the patient, she woke up one morning and started to notice visual change in the left eye with spots in her vision She denies recent headache or other neurological deficits She subsequently was seen by ophthalmologist who referred her to see a retinal specialist and retinal vein occlusion was noted She is referred back here for urgent evaluation to see whether the CLO could be a cause of her visual change she denies recent infection No recent lymphadenopathy Denies recent weight loss, night sweats or anorexia Since then, her visual change is slowly improving The patient denies any recent signs or symptoms of bleeding such as spontaneous epistaxis, hematuria or hematochezia.   REVIEW OF SYSTEMS:   Constitutional: Denies fevers, chills or abnormal weight loss Ears, nose, mouth, throat, and face: Denies mucositis or sore throat Respiratory: Denies cough, dyspnea or wheezes Cardiovascular: Denies palpitation, chest discomfort or lower extremity swelling Gastrointestinal:  Denies nausea, heartburn or change in bowel habits Skin: Denies abnormal skin rashes Lymphatics: Denies new lymphadenopathy or easy bruising Neurological:Denies numbness, tingling or new weaknesses Behavioral/Psych: Mood is stable, no new changes  All other systems were reviewed with the patient and are negative.  I have reviewed the past medical history, past surgical history,  social history and family history with the patient and they are unchanged from previous note.  ALLERGIES:  is allergic to codeine and levofloxacin.  MEDICATIONS:  Current Outpatient Prescriptions  Medication Sig Dispense Refill  . aspirin 81 MG tablet Take 81 mg by mouth daily.    . calcium-vitamin D (OSCAL WITH D) 500-200 MG-UNIT tablet Take 1 tablet by mouth 2 (two) times daily.    Marland Kitchen denosumab (PROLIA) 60 MG/ML SOLN injection Inject 60 mg into the skin every 6 (six) months. Administer in upper arm, thigh, or abdomen    . ibuprofen (ADVIL) 200 MG tablet Take 200 mg by mouth every 6 (six) hours as needed.      . nystatin cream (MYCOSTATIN) Apply 1 application topically 2 (two) times daily.    Marland Kitchen terconazole (TERAZOL 7) 0.4 % vaginal cream Place 1 applicator vaginally at bedtime.    Marland Kitchen terconazole (TERAZOL 7) 0.4 % vaginal cream INSERT ONE APPLICATOR VAGINALLY EVERY 2 WEEKS 45 g 1   No current facility-administered medications for this visit.     PHYSICAL EXAMINATION: ECOG PERFORMANCE STATUS: 1 - Symptomatic but completely ambulatory  Vitals:   10/26/16 1231  BP: (!) 167/71  Pulse: 80  Resp: 18  Temp: 98.1 F (36.7 C)   Filed Weights   10/26/16 1231  Weight: 107 lb (48.5 kg)    GENERAL:alert, no distress and comfortable.  She looks thin SKIN: skin color, texture, turgor are normal, no rashes or significant lesions EYES: normal, Conjunctiva are pink and non-injected, sclera clear OROPHARYNX:no exudate, no erythema and lips, buccal mucosa, and tongue normal  NECK: supple, thyroid normal size, non-tender, without nodularity LYMPH:  no palpable lymphadenopathy in the cervical, axillary  or inguinal LUNGS: clear to auscultation and percussion with normal breathing effort HEART: regular rate & rhythm and no murmurs and no lower extremity edema ABDOMEN:abdomen soft, non-tender and normal bowel sounds Musculoskeletal:no cyanosis of digits and no clubbing  NEURO: alert & oriented x 3  with fluent speech, no focal motor/sensory deficits  LABORATORY DATA:  I have reviewed the data as listed    Component Value Date/Time   NA 140 10/26/2016 1218   K 4.3 10/26/2016 1218   CL 105 01/23/2016 0824   CL 104 10/27/2012 1209   CO2 25 10/26/2016 1218   GLUCOSE 95 10/26/2016 1218   GLUCOSE 104 (H) 10/27/2012 1209   BUN 15.8 10/26/2016 1218   CREATININE 0.8 10/26/2016 1218   CALCIUM 9.6 10/26/2016 1218   PROT 7.0 10/26/2016 1218   ALBUMIN 4.2 10/26/2016 1218   AST 21 10/26/2016 1218   ALT 18 10/26/2016 1218   ALKPHOS 68 10/26/2016 1218   BILITOT 0.75 10/26/2016 1218   GFRNONAA 75.03 02/17/2009 1028   GFRAA  01/16/2009 1000    >60        The eGFR has been calculated using the MDRD equation. This calculation has not been validated in all clinical situations. eGFR's persistently <60 mL/min signify possible Chronic Kidney Disease.    No results found for: SPEP, UPEP  Lab Results  Component Value Date   WBC 72.4 (HH) 10/26/2016   NEUTROABS 3.9 10/26/2016   HGB 13.6 10/26/2016   HCT 41.6 10/26/2016   MCV 101.7 (H) 10/26/2016   PLT 122 (L) 10/26/2016      Chemistry      Component Value Date/Time   NA 140 10/26/2016 1218   K 4.3 10/26/2016 1218   CL 105 01/23/2016 0824   CL 104 10/27/2012 1209   CO2 25 10/26/2016 1218   BUN 15.8 10/26/2016 1218   CREATININE 0.8 10/26/2016 1218      Component Value Date/Time   CALCIUM 9.6 10/26/2016 1218   ALKPHOS 68 10/26/2016 1218   AST 21 10/26/2016 1218   ALT 18 10/26/2016 1218   BILITOT 0.75 10/26/2016 1218       ASSESSMENT & PLAN:  Chronic lymphocytic leukemia The lymphocyte doubling time is more than 1 year. She has mild, persistent thrombocytopenia She has no signs or symptoms to suggest disease progression. However, with  recent visual change, it is not clear whether this could be indirectly related to CLL I am waiting for further workup and results to come back and will call the patient with the plan of  care   Central retinal vein occlusion The patient was recently diagnosed with retinal vein occlusion The cause is unknown The patient does have intermittent high blood pressure that could be a risk factor She is taking aspirin I am awaiting for further workup/results to come back and will call the patient next week for plan of care In the meantime, her vision is improving slowly  Elevated BP without diagnosis of hypertension The patient has intermittent hypertension, thought to be related to whitecoat hypertension We will monitor that closely and if her blood pressure is consistently elevated, she will need close follow-up with primary care provider to consider starting on blood pressure medication  Thrombocytopenia  Could be due to CLL. She is not symptomatic. Observe only.   No orders of the defined types were placed in this encounter.  All questions were answered. The patient knows to call the clinic with any problems, questions or concerns. No barriers to  learning was detected. I spent 20 minutes counseling the patient face to face. The total time spent in the appointment was 25 minutes and more than 50% was on counseling and review of test results     Heath Lark, MD 10/27/2016 6:58 AM

## 2016-10-27 NOTE — Assessment & Plan Note (Signed)
Could be due to CLL. She is not symptomatic. Observe only. 

## 2016-10-27 NOTE — Assessment & Plan Note (Signed)
The lymphocyte doubling time is more than 1 year. She has mild, persistent thrombocytopenia She has no signs or symptoms to suggest disease progression. However, with  recent visual change, it is not clear whether this could be indirectly related to CLL I am waiting for further workup and results to come back and will call the patient with the plan of care

## 2016-10-27 NOTE — Assessment & Plan Note (Signed)
The patient has intermittent hypertension, thought to be related to whitecoat hypertension We will monitor that closely and if her blood pressure is consistently elevated, she will need close follow-up with primary care provider to consider starting on blood pressure medication

## 2016-10-27 NOTE — Assessment & Plan Note (Signed)
The patient was recently diagnosed with retinal vein occlusion The cause is unknown The patient does have intermittent high blood pressure that could be a risk factor She is taking aspirin I am awaiting for further workup/results to come back and will call the patient next week for plan of care In the meantime, her vision is improving slowly

## 2016-10-29 ENCOUNTER — Encounter: Payer: Self-pay | Admitting: Hematology and Oncology

## 2016-10-29 ENCOUNTER — Telehealth: Payer: Self-pay | Admitting: Hematology and Oncology

## 2016-10-29 NOTE — Telephone Encounter (Signed)
I review test results with the patient. Her coagulation study and all other additional workup came back unremarkable I do not believe the CLL is a cause of her visual change I attempted to call her retinal specialist to discuss this but the office is currently closed I have sent a letter to Dr. Zigmund Daniel alerting him of this issue I addressed all her questions and concerns

## 2016-11-07 ENCOUNTER — Encounter (INDEPENDENT_AMBULATORY_CARE_PROVIDER_SITE_OTHER): Payer: Medicare Other | Admitting: Ophthalmology

## 2016-11-07 DIAGNOSIS — H43813 Vitreous degeneration, bilateral: Secondary | ICD-10-CM

## 2016-11-07 DIAGNOSIS — I10 Essential (primary) hypertension: Secondary | ICD-10-CM

## 2016-11-07 DIAGNOSIS — H34812 Central retinal vein occlusion, left eye, with macular edema: Secondary | ICD-10-CM

## 2016-11-07 DIAGNOSIS — H35033 Hypertensive retinopathy, bilateral: Secondary | ICD-10-CM

## 2016-11-12 ENCOUNTER — Encounter (INDEPENDENT_AMBULATORY_CARE_PROVIDER_SITE_OTHER): Payer: Medicare Other | Admitting: Ophthalmology

## 2016-11-16 ENCOUNTER — Ambulatory Visit (INDEPENDENT_AMBULATORY_CARE_PROVIDER_SITE_OTHER): Payer: Medicare Other | Admitting: Internal Medicine

## 2016-11-16 ENCOUNTER — Encounter: Payer: Self-pay | Admitting: Internal Medicine

## 2016-11-16 VITALS — BP 152/78 | HR 86 | Temp 97.6°F | Ht 60.0 in | Wt 105.8 lb

## 2016-11-16 DIAGNOSIS — I10 Essential (primary) hypertension: Secondary | ICD-10-CM

## 2016-11-16 DIAGNOSIS — H539 Unspecified visual disturbance: Secondary | ICD-10-CM

## 2016-11-16 MED ORDER — AMLODIPINE BESYLATE 2.5 MG PO TABS: 2.5000 mg | ORAL_TABLET | Freq: Every day | ORAL | 3 refills | Status: DC

## 2016-11-16 NOTE — Patient Instructions (Addendum)
Limit your sodium (Salt) intake  Return in 4 weeks for follow-up   DASH Eating Plan DASH stands for "Dietary Approaches to Stop Hypertension." The DASH eating plan is a healthy eating plan that has been shown to reduce high blood pressure (hypertension). It may also reduce your risk for type 2 diabetes, heart disease, and stroke. The DASH eating plan may also help with weight loss. What are tips for following this plan? General guidelines   Avoid eating more than 2,300 mg (milligrams) of salt (sodium) a day. If you have hypertension, you may need to reduce your sodium intake to 1,500 mg a day.  Limit alcohol intake to no more than 1 drink a day for nonpregnant women and 2 drinks a day for men. One drink equals 12 oz of beer, 5 oz of wine, or 1 oz of hard liquor.  Work with your health care provider to maintain a healthy body weight or to lose weight. Ask what an ideal weight is for you.  Get at least 30 minutes of exercise that causes your heart to beat faster (aerobic exercise) most days of the week. Activities may include walking, swimming, or biking.  Work with your health care provider or diet and nutrition specialist (dietitian) to adjust your eating plan to your individual calorie needs. Reading food labels   Check food labels for the amount of sodium per serving. Choose foods with less than 5 percent of the Daily Value of sodium. Generally, foods with less than 300 mg of sodium per serving fit into this eating plan.  To find whole grains, look for the word "whole" as the first word in the ingredient list. Shopping   Buy products labeled as "low-sodium" or "no salt added."  Buy fresh foods. Avoid canned foods and premade or frozen meals. Cooking   Avoid adding salt when cooking. Use salt-free seasonings or herbs instead of table salt or sea salt. Check with your health care provider or pharmacist before using salt substitutes.  Do not fry foods. Cook foods using healthy  methods such as baking, boiling, grilling, and broiling instead.  Cook with heart-healthy oils, such as olive, canola, soybean, or sunflower oil. Meal planning    Eat a balanced diet that includes:  5 or more servings of fruits and vegetables each day. At each meal, try to fill half of your plate with fruits and vegetables.  Up to 6-8 servings of whole grains each day.  Less than 6 oz of lean meat, poultry, or fish each day. A 3-oz serving of meat is about the same size as a deck of cards. One egg equals 1 oz.  2 servings of low-fat dairy each day.  A serving of nuts, seeds, or beans 5 times each week.  Heart-healthy fats. Healthy fats called Omega-3 fatty acids are found in foods such as flaxseeds and coldwater fish, like sardines, salmon, and mackerel.  Limit how much you eat of the following:  Canned or prepackaged foods.  Food that is high in trans fat, such as fried foods.  Food that is high in saturated fat, such as fatty meat.  Sweets, desserts, sugary drinks, and other foods with added sugar.  Full-fat dairy products.  Do not salt foods before eating.  Try to eat at least 2 vegetarian meals each week.  Eat more home-cooked food and less restaurant, buffet, and fast food.  When eating at a restaurant, ask that your food be prepared with less salt or no salt, if possible.  What foods are recommended? The items listed may not be a complete list. Talk with your dietitian about what dietary choices are best for you. Grains  Whole-grain or whole-wheat bread. Whole-grain or whole-wheat pasta. Brown rice. [place]. Bulgur. Whole-grain and low-sodium cereals. Pita bread. Low-fat, low-sodium crackers. Whole-wheat flour tortillas. Vegetables  Fresh or frozen vegetables (raw, steamed, roasted, or grilled). Low-sodium or reduced-sodium tomato and vegetable juice. Low-sodium or reduced-sodium tomato sauce and tomato paste. Low-sodium or reduced-sodium canned  vegetables. Fruits  All fresh, dried, or frozen fruit. Canned fruit in natural juice (without added sugar). Meat and other protein foods  Skinless chicken or [place]. Ground chicken or [place]. Pork with fat trimmed off. Fish and seafood. Egg whites. Dried beans, peas, or lentils. Unsalted nuts, nut butters, and seeds. Unsalted canned beans. Lean cuts of beef with fat trimmed off. Low-sodium, lean deli meat. Dairy  Low-fat (1%) or fat-free (skim) milk. Fat-free, low-fat, or reduced-fat cheeses. Nonfat, low-sodium ricotta or cottage cheese. Low-fat or nonfat yogurt. Low-fat, low-sodium cheese. Fats and oils  Soft margarine without trans fats. Vegetable oil. Low-fat, reduced-fat, or light mayonnaise and salad dressings (reduced-sodium). Canola, safflower, olive, soybean, and sunflower oils. Avocado. Seasoning and other foods  Herbs. Spices. Seasoning mixes without salt. Unsalted popcorn and pretzels. Fat-free sweets. What foods are not recommended? The items listed may not be a complete list. Talk with your dietitian about what dietary choices are best for you. Grains  Baked goods made with fat, such as croissants, muffins, or some breads. Dry pasta or rice meal packs. Vegetables  Creamed or fried vegetables. Vegetables in a cheese sauce. Regular canned vegetables (not low-sodium or reduced-sodium). Regular canned tomato sauce and paste (not low-sodium or reduced-sodium). Regular tomato and vegetable juice (not low-sodium or reduced-sodium). Angie Fava. Olives. Fruits  Canned fruit in a light or heavy syrup. Fried fruit. Fruit in cream or butter sauce. Meat and other protein foods  Fatty cuts of meat. Ribs. Fried meat. Berniece Salines. Sausage. Bologna and other processed lunch meats. Salami. Fatback. Hotdogs. Bratwurst. Salted nuts and seeds. Canned beans with added salt. Canned or smoked fish. Whole eggs or egg yolks. Chicken or [place] with skin. Dairy  Whole or 2% milk, cream, and half-and-half. Whole or  full-fat cream cheese. Whole-fat or sweetened yogurt. Full-fat cheese. Nondairy creamers. Whipped toppings. Processed cheese and cheese spreads. Fats and oils  Butter. Stick margarine. Lard. Shortening. Ghee. Bacon fat. Tropical oils, such as coconut, palm kernel, or palm oil. Seasoning and other foods  Salted popcorn and pretzels. Onion salt, garlic salt, seasoned salt, table salt, and sea salt. Worcestershire sauce. Tartar sauce. Barbecue sauce. Teriyaki sauce. Soy sauce, including reduced-sodium. Steak sauce. Canned and packaged gravies. Fish sauce. Oyster sauce. Cocktail sauce. Horseradish that you find on the shelf. Ketchup. Mustard. Meat flavorings and tenderizers. Bouillon cubes. Hot sauce and Tabasco sauce. Premade or packaged marinades. Premade or packaged taco seasonings. Relishes. Regular salad dressings. Where to find more information:  National Heart, Lung, and [place]: https://wilson-eaton.com/  American Heart Association: www.heart.org Summary  The DASH eating plan is a healthy eating plan that has been shown to reduce high blood pressure (hypertension). It may also reduce your risk for type 2 diabetes, heart disease, and stroke.  With the DASH eating plan, you should limit salt (sodium) intake to 2,300 mg a day. If you have hypertension, you may need to reduce your sodium intake to 1,500 mg a day.  When on the DASH eating plan, aim to eat more fresh  fruits and vegetables, whole grains, lean proteins, low-fat dairy, and heart-healthy fats.  Work with your health care provider or diet and nutrition specialist (dietitian) to adjust your eating plan to your individual calorie needs. This information is not intended to replace advice given to you by your health care provider. Make sure you discuss any questions you have with your health care provider. Document Released: 07/05/2011 Document Revised: 07/09/2016 Document Reviewed: 07/09/2016 Elsevier Interactive Patient Education  2017  [employer]  Hypertension Hypertension is another name for high blood pressure. High blood pressure forces your heart to work harder to pump blood. This can cause problems over time. There are two numbers in a blood pressure reading. There is a top number (systolic) over a bottom number (diastolic). It is best to have a blood pressure below 120/80. Healthy choices can help lower your blood pressure. You may need medicine to help lower your blood pressure if:  Your blood pressure cannot be lowered with healthy choices.  Your blood pressure is higher than 130/80. Follow these instructions at home: Eating and drinking   If directed, follow the DASH eating plan. This diet includes:  Filling half of your plate at each meal with fruits and vegetables.  Filling one quarter of your plate at each meal with whole grains. Whole grains include whole wheat pasta, brown rice, and whole grain bread.  Eating or drinking low-fat dairy products, such as skim milk or low-fat yogurt.  Filling one quarter of your plate at each meal with low-fat (lean) proteins. Low-fat proteins include fish, skinless chicken, eggs, beans, and tofu.  Avoiding fatty meat, cured and processed meat, or chicken with skin.  Avoiding premade or processed food.  Eat less than 1,500 mg of salt (sodium) a day.  Limit alcohol use to no more than 1 drink a day for nonpregnant women and 2 drinks a day for men. One drink equals 12 oz of beer, 5 oz of wine, or 1 oz of hard liquor. Lifestyle   Work with your doctor to stay at a healthy weight or to lose weight. Ask your doctor what the best weight is for you.  Get at least 30 minutes of exercise that causes your heart to beat faster (aerobic exercise) most days of the week. This may include walking, swimming, or biking.  Get at least 30 minutes of exercise that strengthens your muscles (resistance exercise) at least 3 days a week. This may include lifting weights or pilates.  Do  not use any products that contain nicotine or tobacco. This includes cigarettes and e-cigarettes. If you need help quitting, ask your doctor.  Check your blood pressure at home as told by your doctor.  Keep all follow-up visits as told by your doctor. This is important. Medicines   Take over-the-counter and prescription medicines only as told by your doctor. Follow directions carefully.  Do not skip doses of blood pressure medicine. The medicine does not work as well if you skip doses. Skipping doses also puts you at risk for problems.  Ask your doctor about side effects or reactions to medicines that you should watch for. Contact a doctor if:  You think you are having a reaction to the medicine you are taking.  You have headaches that keep coming back (recurring).  You feel dizzy.  You have swelling in your ankles.  You have trouble with your vision. Get help right away if:  You get a very bad headache.  You start to feel confused.  You feel weak or numb.  You feel faint.  You get very bad pain in your:  Chest.  Belly (abdomen).  You throw up (vomit) more than once.  You have trouble breathing. Summary  Hypertension is another name for high blood pressure.  Making healthy choices can help lower blood pressure. If your blood pressure cannot be controlled with healthy choices, you may need to take medicine. This information is not intended to replace advice given to you by your health care provider. Make sure you discuss any questions you have with your health care provider. Document Released: 01/02/2008 Document Revised: 06/13/2016 Document Reviewed: 06/13/2016 Elsevier Interactive Patient Education  2017 [employer].

## 2016-11-16 NOTE — Progress Notes (Signed)
Pre visit review using our clinic review tool, if applicable. No additional management support is needed unless otherwise documented below in the visit note. 

## 2016-11-16 NOTE — Progress Notes (Signed)
Subjective:    Patient ID: April [name], female    DOB: 1934/10/12, 82 y.o.   MRN: [id number]  HPI  82 year old patient who has a history of borderline systolic hypertension.  Recently she has been evaluated by ophthalmology due to visual changes and was noted to have a central retinal vein occlusion.  She has a history of CLL and has also been seen by oncology.  Blood pressure readings have generally been a bit elevated with systolics greater than 287 consistently .  Today she generally feels well  Past Medical History:  Diagnosis Date  . Breast CA ([place])   . Bronchitis, chronic ([place])   . 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 Osteoporosis 05/2016   T score -3.0. Stable/improved from prior DEXA     Social History   Social History  . Marital status: Married    Spouse name: N/A  . Number of children: N/A  . Years of education: N/A   Occupational History  . Not on file.   Social History Main Topics  . Smoking status: Former Smoker    Types: Cigarettes    Quit date: 03/26/1955  . Smokeless tobacco: Never Used  . Alcohol use 3.0 oz/week    5 Standard drinks or equivalent per week     Comment: wine  . Drug use: No  . Sexual activity: No     Comment: HYST-1st intercourse 82 yo-More than 5 partners   Other Topics Concern  . Not on file   Social History Narrative  . No narrative on file    Past Surgical History:  Procedure Laterality Date  . ABDOMINAL HYSTERECTOMY  1980  . BREAST IMPLANTS REMOVED  2001  . BREAST SURGERY     Bilateral mastectomy  . MASTECTOMY  1982   BILATERAL  . OOPHORECTOMY     BSO  . PARTIAL COLECTOMY     INTESTINAL ABSCESS WITH SALPINGECTOMY  . RECONSTRUCTION LEFT ELBOW    . TONSILLECTOMY    . UMBILLICAL HERNIA REPAIR      Family History  Problem Relation Age of Onset  . Cancer Brother     HODGKINS  . Cancer Brother     leukemia  . Parkinson's disease Brother   . Heart  disease Mother   . Heart disease Sister     Allergies  Allergen Reactions  . Codeine Nausea Only  . Levofloxacin Rash    Current Outpatient Prescriptions on File Prior to Visit  Medication Sig Dispense Refill  . aspirin 81 MG tablet Take 81 mg by mouth daily.    . calcium-vitamin D (OSCAL WITH D) 500-200 MG-UNIT tablet Take 1 tablet by mouth 2 (two) times daily.    Marland Kitchen denosumab (PROLIA) 60 MG/ML SOLN injection Inject 60 mg into the skin every 6 (six) months. Administer in upper arm, thigh, or abdomen    . ibuprofen (ADVIL) 200 MG tablet Take 200 mg by mouth every 6 (six) hours as needed.      . nystatin cream (MYCOSTATIN) Apply 1 application topically 2 (two) times daily.    Marland Kitchen terconazole (TERAZOL 7) 0.4 % vaginal cream Place 1 applicator vaginally at bedtime.    Marland Kitchen terconazole (TERAZOL 7) 0.4 % vaginal cream INSERT ONE APPLICATOR VAGINALLY EVERY 2 WEEKS 45 g 1   No current facility-administered medications on file prior to visit.     BP (!) 152/78 (BP Location: Left Arm, Patient Position:  Sitting, Cuff Size: Normal)   Pulse 86   Temp 97.6 F (36.4 C) (Oral)   Ht 5' (1.524 m)   Wt 105 lb 12.8 oz (48 kg)   LMP 03/26/1979   SpO2 97%   BMI 20.66 kg/m     Review of Systems  Constitutional: Negative.   Eyes: Positive for visual disturbance.  Respiratory: Negative.   Cardiovascular: Negative.   Neurological: Negative.        Objective:   Physical Exam  Constitutional: She is oriented to person, place, and time. She appears well-developed and well-nourished.  Blood pressure on arrival 152 over 78 Blood pressure as high as 168 over 80  HENT:  Head: Normocephalic.  Right Ear: External ear normal.  Left Ear: External ear normal.  Mouth/Throat: Oropharynx is clear and moist.  Eyes: Conjunctivae and EOM are normal. Pupils are equal, round, and reactive to light.  Neck: Normal range of motion. Neck supple. No thyromegaly present.  Cardiovascular: Normal rate, regular rhythm,  normal heart sounds and intact distal pulses.   Pulmonary/Chest: Effort normal and breath sounds normal.  Abdominal: Soft. Bowel sounds are normal. She exhibits no mass. There is no tenderness.  Musculoskeletal: Normal range of motion.  Lymphadenopathy:    She has no cervical adenopathy.  Neurological: She is alert and oriented to person, place, and time.  Skin: Skin is warm and dry. No rash noted.  Psychiatric: She has a normal mood and affect. Her behavior is normal.          Assessment & Plan:   Isolated systolic hypertension. Nonpharmacologic measures discussed.  Will place on amlodipine 2.5 milligrams daily Follow-up 4 weeks  Nyoka Cowden

## 2016-11-21 ENCOUNTER — Other Ambulatory Visit: Payer: Self-pay | Admitting: Gynecology

## 2016-12-03 ENCOUNTER — Encounter (INDEPENDENT_AMBULATORY_CARE_PROVIDER_SITE_OTHER): Payer: Medicare Other | Admitting: Ophthalmology

## 2016-12-03 DIAGNOSIS — H43813 Vitreous degeneration, bilateral: Secondary | ICD-10-CM

## 2016-12-03 DIAGNOSIS — H34812 Central retinal vein occlusion, left eye, with macular edema: Secondary | ICD-10-CM

## 2016-12-03 DIAGNOSIS — I10 Essential (primary) hypertension: Secondary | ICD-10-CM | POA: Diagnosis not present

## 2016-12-03 DIAGNOSIS — H2513 Age-related nuclear cataract, bilateral: Secondary | ICD-10-CM

## 2016-12-03 DIAGNOSIS — H35033 Hypertensive retinopathy, bilateral: Secondary | ICD-10-CM

## 2016-12-14 ENCOUNTER — Encounter: Payer: Self-pay | Admitting: Internal Medicine

## 2016-12-14 ENCOUNTER — Ambulatory Visit (INDEPENDENT_AMBULATORY_CARE_PROVIDER_SITE_OTHER): Payer: Medicare Other | Admitting: Internal Medicine

## 2016-12-14 DIAGNOSIS — I10 Essential (primary) hypertension: Secondary | ICD-10-CM | POA: Insufficient documentation

## 2016-12-14 NOTE — Patient Instructions (Addendum)
WE NOW OFFER   [facility] FAST TRACK!!!  SAME DAY Appointments for ACUTE CARE  Such as: Sprains, Injuries, cuts, abrasions, rashes, muscle pain, joint pain, back pain Colds, flu, sore throats, headache, allergies, cough, fever  Ear pain, sinus and eye infections Abdominal pain, nausea, vomiting, diarrhea, upset stomach Animal/insect bites  3 Easy Ways to Schedule: Walk-In Scheduling Call in scheduling Mychart Sign-up: [email or web address]    Limit your sodium (Salt) intake  Please check your blood pressure on a regular basis.  If it is consistently greater than 150/90, please make an office appointment.  Return in 6 months for follow-up

## 2016-12-14 NOTE — Progress Notes (Signed)
Subjective:    Patient ID: April [name], female    DOB: Dec 11, 1934, 82 y.o.   MRN: [id number]  HPI  82 year old patient who is followed closely by ophthalmology with a history of a central retinal vein occlusion.  She is seen today for follow-up of primarily systolic hypertension.  Several weeks ago.  She was placed on amlodipine 2.5 milligrams daily.  She has tolerated this medication well.  No new concerns or complaints She continues to receive monthly intraocular Avastin therapy  Past Medical History:  Diagnosis Date  . Breast CA ([place])   . Bronchitis, chronic ([place])   . 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 Osteoporosis 05/2016   T score -3.0. Stable/improved from prior DEXA     Social History   Social History  . Marital status: Married    Spouse name: N/A  . Number of children: N/A  . Years of education: N/A   Occupational History  . Not on file.   Social History Main Topics  . Smoking status: Former Smoker    Types: Cigarettes    Quit date: 03/26/1955  . Smokeless tobacco: Never Used  . Alcohol use 3.0 oz/week    5 Standard drinks or equivalent per week     Comment: wine  . Drug use: No  . Sexual activity: No     Comment: HYST-1st intercourse 82 yo-More than 5 partners   Other Topics Concern  . Not on file   Social History Narrative  . No narrative on file    Past Surgical History:  Procedure Laterality Date  . ABDOMINAL HYSTERECTOMY  1980  . BREAST IMPLANTS REMOVED  2001  . BREAST SURGERY     Bilateral mastectomy  . MASTECTOMY  1982   BILATERAL  . OOPHORECTOMY     BSO  . PARTIAL COLECTOMY     INTESTINAL ABSCESS WITH SALPINGECTOMY  . RECONSTRUCTION LEFT ELBOW    . TONSILLECTOMY    . UMBILLICAL HERNIA REPAIR      Family History  Problem Relation Age of Onset  . Cancer Brother        HODGKINS  . Cancer Brother        leukemia  . Parkinson's disease Brother   . Heart disease  Mother   . Heart disease Sister     Allergies  Allergen Reactions  . Codeine Nausea Only  . Levofloxacin Rash    Current Outpatient Prescriptions on File Prior to Visit  Medication Sig Dispense Refill  . amLODipine (NORVASC) 2.5 MG tablet Take 1 tablet (2.5 mg total) by mouth daily. 90 tablet 3  . aspirin 81 MG tablet Take 81 mg by mouth daily.    Marland Kitchen BESIVANCE 0.6 % SUSP 4 drops daily for 2 days.  12  . calcium-vitamin D (OSCAL WITH D) 500-200 MG-UNIT tablet Take 1 tablet by mouth 2 (two) times daily.    Marland Kitchen denosumab (PROLIA) 60 MG/ML SOLN injection Inject 60 mg into the skin every 6 (six) months. Administer in upper arm, thigh, or abdomen    . ibuprofen (ADVIL) 200 MG tablet Take 200 mg by mouth every 6 (six) hours as needed.      . nystatin cream (MYCOSTATIN) Apply 1 application topically 2 (two) times daily.    Marland Kitchen terconazole (TERAZOL 7) 0.4 % vaginal cream INSERT ONE APPLICATOR VAGINALLY EVERY 2 WEEKS 45 g 1   No current facility-administered medications on file prior  to visit.     BP 128/72 (BP Location: Left Arm, Patient Position: Sitting, Cuff Size: Normal)   Pulse 89   Temp 97.7 F (36.5 C) (Oral)   Ht 5' (1.524 m)   Wt 105 lb 9.6 oz (47.9 kg)   LMP 03/26/1979   SpO2 98%   BMI 20.62 kg/m     Review of Systems  Eyes: Positive for visual disturbance.       Objective:   Physical Exam  Constitutional: She appears well-developed and well-nourished. No distress.  Blood pressure 130/70          Assessment & Plan:   Hypertension, controlled.  Will continue amlodipine 2.5 milligrams daily Low-salt diet recommended Home blood pressure monitoring recommended  Annual exam 6 months  [id number],PETER Pilar Plate

## 2016-12-31 ENCOUNTER — Encounter (INDEPENDENT_AMBULATORY_CARE_PROVIDER_SITE_OTHER): Payer: Medicare Other | Admitting: Ophthalmology

## 2016-12-31 DIAGNOSIS — H34812 Central retinal vein occlusion, left eye, with macular edema: Secondary | ICD-10-CM

## 2016-12-31 DIAGNOSIS — I10 Essential (primary) hypertension: Secondary | ICD-10-CM

## 2016-12-31 DIAGNOSIS — H43813 Vitreous degeneration, bilateral: Secondary | ICD-10-CM

## 2016-12-31 DIAGNOSIS — H35033 Hypertensive retinopathy, bilateral: Secondary | ICD-10-CM | POA: Diagnosis not present

## 2016-12-31 DIAGNOSIS — H2513 Age-related nuclear cataract, bilateral: Secondary | ICD-10-CM | POA: Diagnosis not present

## 2017-01-08 ENCOUNTER — Telehealth: Payer: Self-pay | Admitting: Gynecology

## 2017-01-21 NOTE — Telephone Encounter (Signed)
Prolia due 01/15/17, Deductible $183 (met), Co pay $50 , with /without OV co pay$40 , No PA , No referral,. approx cost $90, Calcium 9.6  10/26/16. Marland Kitchen Pt wants to know if she should continue Prolia due to discussion about drug holiday in 2017 per pt. I will send note to Dr TF>

## 2017-01-21 NOTE — Telephone Encounter (Addendum)
Prolia scheduled for 01/22/17  At 1130 am nurse only.Marland Kitchen Next injection due 07/25/17 per TF with bone density in 2019

## 2017-01-21 NOTE — Telephone Encounter (Signed)
As she has been on this approaching 5 years I would recommend going ahead and getting this shot and then make this one her last one stop it after this and then we will repeat her bone density in 2019.

## 2017-01-22 ENCOUNTER — Ambulatory Visit (INDEPENDENT_AMBULATORY_CARE_PROVIDER_SITE_OTHER): Payer: Medicare Other | Admitting: Gynecology

## 2017-01-22 ENCOUNTER — Encounter: Payer: Self-pay | Admitting: Gynecology

## 2017-01-22 DIAGNOSIS — M810 Age-related osteoporosis without current pathological fracture: Secondary | ICD-10-CM

## 2017-01-22 MED ORDER — DENOSUMAB 60 MG/ML ~~LOC~~ SOLN
60.0000 mg | Freq: Once | SUBCUTANEOUS | Status: AC
  Administered 2017-01-22: 60 mg via SUBCUTANEOUS

## 2017-01-31 ENCOUNTER — Other Ambulatory Visit: Payer: Self-pay

## 2017-02-06 ENCOUNTER — Encounter (INDEPENDENT_AMBULATORY_CARE_PROVIDER_SITE_OTHER): Payer: Medicare Other | Admitting: Ophthalmology

## 2017-02-06 ENCOUNTER — Encounter: Payer: Self-pay | Admitting: Internal Medicine

## 2017-02-06 DIAGNOSIS — I10 Essential (primary) hypertension: Secondary | ICD-10-CM | POA: Diagnosis not present

## 2017-02-06 DIAGNOSIS — H43813 Vitreous degeneration, bilateral: Secondary | ICD-10-CM | POA: Diagnosis not present

## 2017-02-06 DIAGNOSIS — H35033 Hypertensive retinopathy, bilateral: Secondary | ICD-10-CM

## 2017-02-06 DIAGNOSIS — H34812 Central retinal vein occlusion, left eye, with macular edema: Secondary | ICD-10-CM | POA: Diagnosis not present

## 2017-03-21 ENCOUNTER — Encounter (INDEPENDENT_AMBULATORY_CARE_PROVIDER_SITE_OTHER): Payer: Medicare Other | Admitting: Ophthalmology

## 2017-03-21 DIAGNOSIS — I10 Essential (primary) hypertension: Secondary | ICD-10-CM | POA: Diagnosis not present

## 2017-03-21 DIAGNOSIS — H34812 Central retinal vein occlusion, left eye, with macular edema: Secondary | ICD-10-CM

## 2017-03-21 DIAGNOSIS — H2513 Age-related nuclear cataract, bilateral: Secondary | ICD-10-CM

## 2017-03-21 DIAGNOSIS — H43813 Vitreous degeneration, bilateral: Secondary | ICD-10-CM

## 2017-03-21 DIAGNOSIS — H35033 Hypertensive retinopathy, bilateral: Secondary | ICD-10-CM

## 2017-03-25 ENCOUNTER — Other Ambulatory Visit: Payer: Self-pay | Admitting: Gynecology

## 2017-04-05 ENCOUNTER — Encounter: Payer: Self-pay | Admitting: Gynecology

## 2017-04-05 ENCOUNTER — Ambulatory Visit (INDEPENDENT_AMBULATORY_CARE_PROVIDER_SITE_OTHER): Payer: Medicare Other | Admitting: Gynecology

## 2017-04-05 VITALS — BP 118/76 | Ht 60.5 in | Wt 103.0 lb

## 2017-04-05 DIAGNOSIS — M810 Age-related osteoporosis without current pathological fracture: Secondary | ICD-10-CM | POA: Diagnosis not present

## 2017-04-05 DIAGNOSIS — N952 Postmenopausal atrophic vaginitis: Secondary | ICD-10-CM | POA: Diagnosis not present

## 2017-04-05 DIAGNOSIS — Z01411 Encounter for gynecological examination (general) (routine) with abnormal findings: Secondary | ICD-10-CM

## 2017-04-05 NOTE — Patient Instructions (Signed)
Continue on the Prolia for now. Follow up in one year.

## 2017-04-05 NOTE — Progress Notes (Signed)
    April [name] February 04, 1935 [id number]        82 y.o.  G0P0 for annual gynecologic exam.    Past medical history,surgical history, problem list, medications, allergies, family history and social history were all reviewed and documented as reviewed in the EPIC chart.  ROS:  Performed with pertinent positives and negatives included in the history, assessment and plan.   Additional significant findings :  None   Exam: April [name] assistant Vitals:   04/05/17 1019  BP: 118/76  Weight: 103 lb (46.7 kg)  Height: 5' 0.5" (1.537 m)   Body mass index is 19.78 kg/m.  General appearance:  Normal affect, orientation and appearance. Skin: Grossly normal HEENT: Without gross lesions.  No cervical or supraclavicular adenopathy. Thyroid normal.  Lungs:  Clear without wheezing, rales or rhonchi Cardiac: RR, without RMG Abdominal:  Soft, nontender, without masses, guarding, rebound, organomegaly or hernia Breasts:  Examined lying and sitting status post bilateral mastectomies. No masses or axillary adenopathy Pelvic:  Ext, BUS, Vagina: With atrophic changes  Adnexa: Without masses or tenderness    Anus and perineum: Normal   Rectovaginal: Normal sphincter tone without palpated masses or tenderness.    Assessment/Plan:  82 y.o. G0P0 female for annual gynecologic exam. Status post abdominal hysterectomy with BSO in the past  1. Postmenopausal/atrophic genital changes. No significant hot flushes, night sweats or vaginal dryness. 2. Osteoporosis. DEXA 05/2016 T score -3 stable/improved from prior study. On Prolia now for 5 years. Doing well without side effects. Continue on Prolia this coming year.  Repeat DEXA next year at 2 year interval.  3. Pap smear 2012. No Pap smear done today. Status post hysterectomy. We both agree to stop screening per current screening guidelines based on age and hysterectomy history. 4. Colonoscopy 10 years ago. Is followed was stool screening through her primary  physician's office. 5. History of breast cancer status post bilateral mastectomies. Exam NED. Continue with self chest exams. 6. Health maintenance. No routine lab work done as patient does this elsewhere. Follow up 1 year, sooner as needed.   April Auerbach MD, 10:44 AM 04/05/2017

## 2017-04-09 ENCOUNTER — Other Ambulatory Visit: Payer: Self-pay | Admitting: *Deleted

## 2017-04-09 MED ORDER — NYSTATIN 100000 UNIT/GM EX CREA: 1.0000 "application " | TOPICAL_CREAM | Freq: Two times a day (BID) | CUTANEOUS | 3 refills | Status: DC

## 2017-04-18 ENCOUNTER — Encounter: Payer: Self-pay | Admitting: Internal Medicine

## 2017-04-25 ENCOUNTER — Ambulatory Visit (HOSPITAL_BASED_OUTPATIENT_CLINIC_OR_DEPARTMENT_OTHER): Payer: Medicare Other | Admitting: Hematology and Oncology

## 2017-04-25 ENCOUNTER — Encounter: Payer: Self-pay | Admitting: Hematology and Oncology

## 2017-04-25 ENCOUNTER — Other Ambulatory Visit: Payer: Self-pay | Admitting: Hematology and Oncology

## 2017-04-25 ENCOUNTER — Telehealth: Payer: Self-pay | Admitting: Hematology and Oncology

## 2017-04-25 ENCOUNTER — Other Ambulatory Visit (HOSPITAL_BASED_OUTPATIENT_CLINIC_OR_DEPARTMENT_OTHER): Payer: Medicare Other

## 2017-04-25 VITALS — BP 148/71 | HR 71 | Temp 98.3°F | Resp 20 | Ht 60.5 in | Wt 104.7 lb

## 2017-04-25 DIAGNOSIS — C9110 Chronic lymphocytic leukemia of B-cell type not having achieved remission: Secondary | ICD-10-CM

## 2017-04-25 DIAGNOSIS — H539 Unspecified visual disturbance: Secondary | ICD-10-CM

## 2017-04-25 DIAGNOSIS — Z23 Encounter for immunization: Secondary | ICD-10-CM | POA: Diagnosis not present

## 2017-04-25 DIAGNOSIS — D696 Thrombocytopenia, unspecified: Secondary | ICD-10-CM | POA: Diagnosis not present

## 2017-04-25 LAB — COMPREHENSIVE METABOLIC PANEL
ALBUMIN: 4.4 g/dL (ref 3.5–5.0)
ALK PHOS: 69 U/L (ref 40–150)
ALT: 14 U/L (ref 0–55)
ANION GAP: 8 meq/L (ref 3–11)
AST: 23 U/L (ref 5–34)
BUN: 13.1 mg/dL (ref 7.0–26.0)
CO2: 26 meq/L (ref 22–29)
Calcium: 9.8 mg/dL (ref 8.4–10.4)
Chloride: 107 mEq/L (ref 98–109)
Creatinine: 0.8 mg/dL (ref 0.6–1.1)
EGFR: 68 mL/min/{1.73_m2} — ABNORMAL LOW (ref >90)
GLUCOSE: 97 mg/dL (ref 70–140)
POTASSIUM: 4.6 meq/L (ref 3.5–5.1)
SODIUM: 141 meq/L (ref 136–145)
Total Bilirubin: 0.95 mg/dL (ref 0.20–1.20)
Total Protein: 7.5 g/dL (ref 6.4–8.3)

## 2017-04-25 LAB — CBC WITH DIFFERENTIAL/PLATELET
BASO%: 0.1 % (ref 0.0–2.0)
Basophils Absolute: 0.1 10*3/uL (ref 0.0–0.1)
EOS%: 0.2 % (ref 0.0–7.0)
Eosinophils Absolute: 0.2 10*3/uL (ref 0.0–0.5)
HEMATOCRIT: 42.3 % (ref 34.8–46.6)
HEMOGLOBIN: 13.7 g/dL (ref 11.6–15.9)
LYMPH#: 78.8 10*3/uL — AB (ref 0.9–3.3)
LYMPH%: 93.9 % — ABNORMAL HIGH (ref 14.0–49.7)
MCH: 33.3 pg (ref 25.1–34.0)
MCHC: 32.3 g/dL (ref 31.5–36.0)
MCV: 102.9 fL — AB (ref 79.5–101.0)
MONO#: 1.2 10*3/uL — ABNORMAL HIGH (ref 0.1–0.9)
MONO%: 1.4 % (ref 0.0–14.0)
NEUT%: 4.4 % — AB (ref 38.4–76.8)
NEUTROS ABS: 3.7 10*3/uL (ref 1.5–6.5)
PLATELETS: 138 10*3/uL — AB (ref 145–400)
RBC: 4.11 10*6/uL (ref 3.70–5.45)
RDW: 13.9 % (ref 11.2–14.5)
WBC: 84 10*3/uL — AB (ref 3.9–10.3)

## 2017-04-25 LAB — TECHNOLOGIST REVIEW

## 2017-04-25 LAB — LACTATE DEHYDROGENASE: LDH: 191 U/L (ref 125–245)

## 2017-04-25 MED ORDER — INFLUENZA VAC SPLIT QUAD 0.5 ML IM SUSY
0.5000 mL | PREFILLED_SYRINGE | Freq: Once | INTRAMUSCULAR | Status: AC
  Administered 2017-04-25: 0.5 mL via INTRAMUSCULAR
  Filled 2017-04-25: qty 0.5

## 2017-04-25 NOTE — Telephone Encounter (Signed)
Scheduled appt per 9/27 los - Gave patient AVS and calender per los.  

## 2017-04-25 NOTE — Assessment & Plan Note (Signed)
Her visual changes has improved She will continue close follow-up with ophthalmologist.

## 2017-04-25 NOTE — Assessment & Plan Note (Signed)
Could be due to CLL. She is not symptomatic. Observe only. 

## 2017-04-25 NOTE — Assessment & Plan Note (Signed)
The lymphocyte doubling time is more than 1 year. She has mild, persistent thrombocytopenia She has no signs or symptoms to suggest disease progression. Plan to see her back again in 6 months The patient is educated about signs and symptoms to watch out for disease progression We discussed the importance of preventive care and reviewed the vaccination programs. She does not have any prior allergic reactions to influenza vaccination. She agrees to proceed with influenza vaccination today and we will administer it today at the clinic.

## 2017-04-25 NOTE — Progress Notes (Signed)
April [name] OFFICE PROGRESS NOTE  Patient Care Team: Marletta Lor, MD as PCP - General  SUMMARY OF ONCOLOGIC HISTORY:   Chronic lymphocytic leukemia ([place])   03/15/2009 Pathology Results    Case #: [id number]  flow cytometry of peripheral blood comfirmed CLL.       INTERVAL HISTORY: Please see below for problem oriented charting. She returns for further follow-up She denies worsening visual changes In fact, her visual changes have improved since her last visit Denies recent infection No new lymphadenopathy Denies abnormal night sweats, anorexia or weight loss. The patient denies any recent signs or symptoms of bleeding such as spontaneous epistaxis, hematuria or hematochezia.   REVIEW OF SYSTEMS:   Constitutional: Denies fevers, chills or abnormal weight loss Eyes: Denies blurriness of vision Ears, nose, mouth, throat, and face: Denies mucositis or sore throat Respiratory: Denies cough, dyspnea or wheezes Cardiovascular: Denies palpitation, chest discomfort or lower extremity swelling Gastrointestinal:  Denies nausea, heartburn or change in bowel habits Skin: Denies abnormal skin rashes Lymphatics: Denies new lymphadenopathy or easy bruising Neurological:Denies numbness, tingling or new weaknesses Behavioral/Psych: Mood is stable, no new changes  All other systems were reviewed with the patient and are negative.  I have reviewed the past medical history, past surgical history, social history and family history with the patient and they are unchanged from previous note.  ALLERGIES:  is allergic to codeine and levofloxacin.  MEDICATIONS:  Current Outpatient Prescriptions  Medication Sig Dispense Refill  . amLODipine (NORVASC) 2.5 MG tablet Take 1 tablet (2.5 mg total) by mouth daily. 90 tablet 3  . aspirin 81 MG tablet Take 81 mg by mouth daily.    Marland Kitchen BESIVANCE 0.6 % SUSP 4 drops daily for 2 days.  12  . calcium-vitamin D (OSCAL WITH D) 500-200 MG-UNIT  tablet Take 1 tablet by mouth 2 (two) times daily.    Marland Kitchen denosumab (PROLIA) 60 MG/ML SOLN injection Inject 60 mg into the skin every 6 (six) months. Administer in upper arm, thigh, or abdomen    . ibuprofen (ADVIL) 200 MG tablet Take 200 mg by mouth every 6 (six) hours as needed.      . nystatin cream (MYCOSTATIN) Apply 1 application topically 2 (two) times daily. 30 g 3  . terconazole (TERAZOL 7) 0.4 % vaginal cream INSERT ONE APPLICATOR VAGINALLY EVERY 2 WEEKS 45 g 1   No current facility-administered medications for this visit.     PHYSICAL EXAMINATION: ECOG PERFORMANCE STATUS: 0 - Asymptomatic  Vitals:   04/25/17 1059  BP: (!) 148/71  Pulse: 71  Resp: 20  Temp: 98.3 F (36.8 C)  SpO2: 100%   Filed Weights   04/25/17 1059  Weight: 104 lb 11.2 oz (47.5 kg)    GENERAL:alert, no distress and comfortable.  She looks thin and cachectic SKIN: skin color, texture, turgor are normal, no rashes or significant lesions EYES: normal, Conjunctiva are pink and non-injected, sclera clear OROPHARYNX:no exudate, no erythema and lips, buccal mucosa, and tongue normal  NECK: supple, thyroid normal size, non-tender, without nodularity LYMPH:  no palpable lymphadenopathy in the cervical, axillary or inguinal LUNGS: clear to auscultation and percussion with normal breathing effort HEART: regular rate & rhythm and no murmurs and no lower extremity edema ABDOMEN:abdomen soft, non-tender and normal bowel sounds Musculoskeletal:no cyanosis of digits and no clubbing  NEURO: alert & oriented x 3 with fluent speech, no focal motor/sensory deficits  LABORATORY DATA:  I have reviewed the data as listed  Component Value Date/Time   NA 141 04/25/2017 1041   K 4.6 04/25/2017 1041   CL 105 01/23/2016 0824   CL 104 10/27/2012 1209   CO2 26 04/25/2017 1041   GLUCOSE 97 04/25/2017 1041   GLUCOSE 104 (H) 10/27/2012 1209   BUN 13.1 04/25/2017 1041   CREATININE 0.8 04/25/2017 1041   CALCIUM 9.8  04/25/2017 1041   PROT 7.5 04/25/2017 1041   ALBUMIN 4.4 04/25/2017 1041   AST 23 04/25/2017 1041   ALT 14 04/25/2017 1041   ALKPHOS 69 04/25/2017 1041   BILITOT 0.95 04/25/2017 1041   GFRNONAA 75.03 02/17/2009 1028   GFRAA  01/16/2009 1000    >60        The eGFR has been calculated using the MDRD equation. This calculation has not been validated in all clinical situations. eGFR's persistently <60 mL/min signify possible Chronic Kidney Disease.    No results found for: SPEP, UPEP  Lab Results  Component Value Date   WBC 84.0 (HH) 04/25/2017   NEUTROABS 3.7 04/25/2017   HGB 13.7 04/25/2017   HCT 42.3 04/25/2017   MCV 102.9 (H) 04/25/2017   PLT 138 (L) 04/25/2017      Chemistry      Component Value Date/Time   NA 141 04/25/2017 1041   K 4.6 04/25/2017 1041   CL 105 01/23/2016 0824   CL 104 10/27/2012 1209   CO2 26 04/25/2017 1041   BUN 13.1 04/25/2017 1041   CREATININE 0.8 04/25/2017 1041      Component Value Date/Time   CALCIUM 9.8 04/25/2017 1041   ALKPHOS 69 04/25/2017 1041   AST 23 04/25/2017 1041   ALT 14 04/25/2017 1041   BILITOT 0.95 04/25/2017 1041      ASSESSMENT & PLAN:  Chronic lymphocytic leukemia The lymphocyte doubling time is more than 1 year. She has mild, persistent thrombocytopenia She has no signs or symptoms to suggest disease progression. Plan to see her back again in 6 months The patient is educated about signs and symptoms to watch out for disease progression We discussed the importance of preventive care and reviewed the vaccination programs. She does not have any prior allergic reactions to influenza vaccination. She agrees to proceed with influenza vaccination today and we will administer it today at the clinic.   Thrombocytopenia  Could be due to CLL. She is not symptomatic. Observe only.  Visual changes Her visual changes has improved She will continue close follow-up with ophthalmologist.   No orders of the defined types  were placed in this encounter.  All questions were answered. The patient knows to call the clinic with any problems, questions or concerns. No barriers to learning was detected. I spent 15 minutes counseling the patient face to face. The total time spent in the appointment was 20 minutes and more than 50% was on counseling and review of test results     Heath Lark, MD 04/25/2017 1:04 PM

## 2017-05-02 ENCOUNTER — Telehealth: Payer: Self-pay

## 2017-05-02 ENCOUNTER — Encounter (INDEPENDENT_AMBULATORY_CARE_PROVIDER_SITE_OTHER): Payer: Medicare Other | Admitting: Ophthalmology

## 2017-05-02 DIAGNOSIS — H34812 Central retinal vein occlusion, left eye, with macular edema: Secondary | ICD-10-CM

## 2017-05-02 DIAGNOSIS — I10 Essential (primary) hypertension: Secondary | ICD-10-CM

## 2017-05-02 DIAGNOSIS — H43813 Vitreous degeneration, bilateral: Secondary | ICD-10-CM | POA: Diagnosis not present

## 2017-05-02 DIAGNOSIS — H35033 Hypertensive retinopathy, bilateral: Secondary | ICD-10-CM

## 2017-05-02 NOTE — Telephone Encounter (Signed)
Called pt back and she has an eye appt at 1330 so cannot make 2 pm. Tomorrow and next week are clear on her calendar.

## 2017-05-02 NOTE — Telephone Encounter (Signed)
I can see her at 2 pm today, 15 mins Please send scheduling msg

## 2017-05-02 NOTE — Telephone Encounter (Signed)
Monday 1030

## 2017-05-02 NOTE — Telephone Encounter (Signed)
A place on her left leg, above the knee on the inside, lots of little spots in an approx 8 inch area. Raised, not itching, very red, dry at present. This has happened in the past. Noticed them on Sunday.  This is worse than she is accustomed to. She would like someone to look at them.

## 2017-05-02 NOTE — Telephone Encounter (Signed)
lvm Monday 1030. Sent in basket to scheduler.

## 2017-05-03 ENCOUNTER — Telehealth: Payer: Self-pay | Admitting: Hematology and Oncology

## 2017-05-03 NOTE — Telephone Encounter (Signed)
Spoke with patient regarding her appts scheduled per 10/4 sch msg.

## 2017-05-06 ENCOUNTER — Ambulatory Visit (HOSPITAL_BASED_OUTPATIENT_CLINIC_OR_DEPARTMENT_OTHER): Payer: Medicare Other | Admitting: Hematology and Oncology

## 2017-05-06 ENCOUNTER — Encounter: Payer: Self-pay | Admitting: Hematology and Oncology

## 2017-05-06 ENCOUNTER — Telehealth: Payer: Self-pay | Admitting: Hematology and Oncology

## 2017-05-06 DIAGNOSIS — L982 Febrile neutrophilic dermatosis [Sweet]: Secondary | ICD-10-CM

## 2017-05-06 DIAGNOSIS — R21 Rash and other nonspecific skin eruption: Secondary | ICD-10-CM | POA: Diagnosis not present

## 2017-05-06 DIAGNOSIS — C9110 Chronic lymphocytic leukemia of B-cell type not having achieved remission: Secondary | ICD-10-CM

## 2017-05-06 DIAGNOSIS — L989 Disorder of the skin and subcutaneous tissue, unspecified: Secondary | ICD-10-CM | POA: Insufficient documentation

## 2017-05-06 MED ORDER — TRIAMCINOLONE ACETONIDE 0.5 % EX OINT: 1.0000 "application " | TOPICAL_OINTMENT | Freq: Two times a day (BID) | CUTANEOUS | 0 refills | Status: DC

## 2017-05-06 NOTE — Telephone Encounter (Signed)
Per 10/8 los - Return for No new orders or return visit. °

## 2017-05-06 NOTE — Assessment & Plan Note (Signed)
Per discussion in our last visit, we would defer treatment until next year Currently, there is no indication to start her on treatment

## 2017-05-06 NOTE — Assessment & Plan Note (Signed)
The skin rash on her legs are most likely related to her underlying malignancy I recommend intermittent short courses of topical steroid cream We discussed also follow-up with dermatologist but the patient declined for now With her permission, I have taken pictures at baseline so that we can follow in her next visit The risks, benefits, side effects of steroid cream is discussed with the patient and she agreed to proceed

## 2017-05-06 NOTE — Progress Notes (Signed)
[facility] OFFICE PROGRESS NOTE  Patient Care Team: Marletta Lor, MD as PCP - General  SUMMARY OF ONCOLOGIC HISTORY:   Chronic lymphocytic leukemia ([place])   03/15/2009 Pathology Results    Case #: [id number]  flow cytometry of peripheral blood comfirmed CLL.       INTERVAL HISTORY: Please see below for problem oriented charting.. She returns for evaluation of skin rashes on both legs According to the patient, the skin rashes has been present for a long time The rash does not bother her too much but it does flareup intermittently She denies excessive skin itching Denies bleeding or discharge from the skin lesions. In the meantime, she denies recent infection since the last time she was seen a month  REVIEW OF SYSTEMS:   Constitutional: Denies fevers, chills or abnormal weight loss Eyes: Denies blurriness of vision Ears, nose, mouth, throat, and face: Denies mucositis or sore throat Respiratory: Denies cough, dyspnea or wheezes Cardiovascular: Denies palpitation, chest discomfort or lower extremity swelling Gastrointestinal:  Denies nausea, heartburn or change in bowel habits Neurological:Denies numbness, tingling or new weaknesses Behavioral/Psych: Mood is stable, no new changes  All other systems were reviewed with the patient and are negative.  I have reviewed the past medical history, past surgical history, social history and family history with the patient and they are unchanged from previous note.  ALLERGIES:  is allergic to codeine and levofloxacin.  MEDICATIONS:  Current Outpatient Prescriptions  Medication Sig Dispense Refill  . amLODipine (NORVASC) 2.5 MG tablet Take 1 tablet (2.5 mg total) by mouth daily. 90 tablet 3  . aspirin 81 MG tablet Take 81 mg by mouth daily.    Marland Kitchen BESIVANCE 0.6 % SUSP 4 drops daily for 2 days.  12  . calcium-vitamin D (OSCAL WITH D) 500-200 MG-UNIT tablet Take 1 tablet by mouth 2 (two) times daily.    Marland Kitchen denosumab  (PROLIA) 60 MG/ML SOLN injection Inject 60 mg into the skin every 6 (six) months. Administer in upper arm, thigh, or abdomen    . ibuprofen (ADVIL) 200 MG tablet Take 200 mg by mouth every 6 (six) hours as needed.      . nystatin cream (MYCOSTATIN) Apply 1 application topically 2 (two) times daily. 30 g 3  . terconazole (TERAZOL 7) 0.4 % vaginal cream INSERT ONE APPLICATOR VAGINALLY EVERY 2 WEEKS 45 g 1  . triamcinolone ointment (KENALOG) 0.5 % Apply 1 application topically 2 (two) times daily. 30 g 0   No current facility-administered medications for this visit.     PHYSICAL EXAMINATION: ECOG PERFORMANCE STATUS: 1 - Symptomatic but completely ambulatory  Vitals:   05/06/17 1033  BP: 131/68  Pulse: 76  Resp: 18  Temp: 97.6 F (36.4 C)  SpO2: 98%   Filed Weights   05/06/17 1033  Weight: 105 lb 4.8 oz (47.8 kg)    GENERAL:alert, no distress and comfortable SKIN: Noted skin rash, compatible with neutrophilic dermatosis/ sweet syndrome EYES: normal, Conjunctiva are pink and non-injected, sclera clear OROPHARYNX:no exudate, no erythema and lips, buccal mucosa, and tongue normal  NECK: supple, thyroid normal size, non-tender, without nodularity LYMPH:  no palpable lymphadenopathy in the cervical, axillary or inguinal LUNGS: clear to auscultation and percussion with normal breathing effort HEART: regular rate & rhythm and no murmurs and no lower extremity edema ABDOMEN:abdomen soft, non-tender and normal bowel sounds Musculoskeletal:no cyanosis of digits and no clubbing  NEURO: alert & oriented x 3 with fluent speech, no focal motor/sensory deficits  LABORATORY DATA:  I have reviewed the data as listed    Component Value Date/Time   NA 141 04/25/2017 1041   K 4.6 04/25/2017 1041   CL 105 01/23/2016 0824   CL 104 10/27/2012 1209   CO2 26 04/25/2017 1041   GLUCOSE 97 04/25/2017 1041   GLUCOSE 104 (H) 10/27/2012 1209   BUN 13.1 04/25/2017 1041   CREATININE 0.8 04/25/2017 1041     CALCIUM 9.8 04/25/2017 1041   PROT 7.5 04/25/2017 1041   ALBUMIN 4.4 04/25/2017 1041   AST 23 04/25/2017 1041   ALT 14 04/25/2017 1041   ALKPHOS 69 04/25/2017 1041   BILITOT 0.95 04/25/2017 1041   GFRNONAA 75.03 02/17/2009 1028   GFRAA  01/16/2009 1000    >60        The eGFR has been calculated using the MDRD equation. This calculation has not been validated in all clinical situations. eGFR's persistently <60 mL/min signify possible Chronic Kidney Disease.    No results found for: SPEP, UPEP  Lab Results  Component Value Date   WBC 84.0 (HH) 04/25/2017   NEUTROABS 3.7 04/25/2017   HGB 13.7 04/25/2017   HCT 42.3 04/25/2017   MCV 102.9 (H) 04/25/2017   PLT 138 (L) 04/25/2017      Chemistry      Component Value Date/Time   NA 141 04/25/2017 1041   K 4.6 04/25/2017 1041   CL 105 01/23/2016 0824   CL 104 10/27/2012 1209   CO2 26 04/25/2017 1041   BUN 13.1 04/25/2017 1041   CREATININE 0.8 04/25/2017 1041      Component Value Date/Time   CALCIUM 9.8 04/25/2017 1041   ALKPHOS 69 04/25/2017 1041   AST 23 04/25/2017 1041   ALT 14 04/25/2017 1041   BILITOT 0.95 04/25/2017 1041          ASSESSMENT & PLAN:  Chronic lymphocytic leukemia Per discussion in our last visit, we would defer treatment until next year Currently, there is no indication to start her on treatment  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  All questions were answered. The patient knows to call the clinic with any problems,  questions or concerns. No barriers to learning was detected. I spent 15 minutes counseling the patient face to face. The total time spent in the appointment was 20 minutes and more than 50% was on counseling and review of test results     Heath Lark, MD 05/06/2017 10:56 AM

## 2017-05-07 LAB — FISH,CLL PROGNOSTIC PANEL

## 2017-06-11 ENCOUNTER — Ambulatory Visit (INDEPENDENT_AMBULATORY_CARE_PROVIDER_SITE_OTHER): Payer: Medicare Other | Admitting: Internal Medicine

## 2017-06-11 ENCOUNTER — Encounter: Payer: Self-pay | Admitting: Internal Medicine

## 2017-06-11 VITALS — BP 122/70 | HR 90 | Temp 97.6°F | Ht 60.0 in | Wt 105.4 lb

## 2017-06-11 DIAGNOSIS — M150 Primary generalized (osteo)arthritis: Secondary | ICD-10-CM | POA: Diagnosis not present

## 2017-06-11 DIAGNOSIS — Z85828 Personal history of other malignant neoplasm of skin: Secondary | ICD-10-CM | POA: Diagnosis not present

## 2017-06-11 DIAGNOSIS — Z0000 Encounter for general adult medical examination without abnormal findings: Secondary | ICD-10-CM | POA: Diagnosis not present

## 2017-06-11 DIAGNOSIS — E785 Hyperlipidemia, unspecified: Secondary | ICD-10-CM | POA: Diagnosis not present

## 2017-06-11 DIAGNOSIS — I10 Essential (primary) hypertension: Secondary | ICD-10-CM | POA: Diagnosis not present

## 2017-06-11 DIAGNOSIS — C9110 Chronic lymphocytic leukemia of B-cell type not having achieved remission: Secondary | ICD-10-CM

## 2017-06-11 DIAGNOSIS — C9190 Lymphoid leukemia, unspecified not having achieved remission: Secondary | ICD-10-CM

## 2017-06-11 DIAGNOSIS — M159 Polyosteoarthritis, unspecified: Secondary | ICD-10-CM

## 2017-06-11 LAB — LIPID PANEL
CHOLESTEROL: 198 mg/dL (ref 0–200)
HDL: 52.3 mg/dL (ref >39.00)
LDL CALC: 126 mg/dL — AB (ref 0–99)
NonHDL: 145.78
Total CHOL/HDL Ratio: 4
Triglycerides: 101 mg/dL (ref 0.0–149.0)
VLDL: 20.2 mg/dL (ref 0.0–40.0)

## 2017-06-11 LAB — TSH: TSH: 1.71 u[IU]/mL (ref 0.35–4.50)

## 2017-06-11 NOTE — Progress Notes (Signed)
Subjective:    Patient ID: April [name], female    DOB: [birth year], 82 y.o.   MRN: [id number]  HPI  82 year old patient who is seen today for a preventive health examination and subsequent Medicare wellness visit. She is followed by oncology with a history of CLL.  She is also followed closely by ophthalmology following a central retinal vein occlusion. She has remote history of breast cancer and is status post bilateral mastectomies.  She remains on Prolia for osteoporosis  Past Medical History:  Diagnosis Date  . Breast CA ([place])   . Bronchitis, chronic ([place])   . Eye hemorrhage, left   . Leukemia, chronic lymphoid ([place])   . 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 Osteoporosis 05/2016   T score -3.0. Stable/improved from prior DEXA     Social History   Socioeconomic History  . Marital status: Married    Spouse name: Not on file  . Number of children: Not on file  . Years of education: Not on file  . Highest education level: Not on file  Social Needs  . Financial resource strain: Not on file  . Food insecurity - worry: Not on file  . Food insecurity - inability: Not on file  . Transportation needs - medical: Not on file  . Transportation needs - non-medical: Not on file  Occupational History  . Not on file  Tobacco Use  . Smoking status: Former Smoker    Types: Cigarettes    Last attempt to quit: 03/26/1955    Years since quitting: 62.2  . Smokeless tobacco: Never Used  Substance and Sexual Activity  . Alcohol use: Yes    Alcohol/week: 3.0 oz    Types: 5 Standard drinks or equivalent per week    Comment: wine  . Drug use: No  . Sexual activity: No    Partners: Male    Birth control/protection: Surgical    Comment: HYST-1st intercourse 82 yo-More than 5 partners  Other Topics Concern  . Not on file  Social History Narrative  . Not on file    Past Surgical History:  Procedure Laterality Date  . ABDOMINAL HYSTERECTOMY  1980  .  BREAST IMPLANTS REMOVED  2001  . BREAST SURGERY     Bilateral mastectomy  . MASTECTOMY  1982   BILATERAL  . OOPHORECTOMY     BSO  . PARTIAL COLECTOMY     INTESTINAL ABSCESS WITH SALPINGECTOMY  . RECONSTRUCTION LEFT ELBOW    . TONSILLECTOMY    . UMBILLICAL HERNIA REPAIR      Family History  Problem Relation Age of Onset  . Cancer Brother        HODGKINS  . Cancer Brother        leukemia  . Parkinson's disease Brother   . Heart disease Mother   . Heart disease Sister     Allergies  Allergen Reactions  . Codeine Nausea Only  . Levofloxacin Rash    Current Outpatient Medications on File Prior to Visit  Medication Sig Dispense Refill  . amLODipine (NORVASC) 2.5 MG tablet Take 1 tablet (2.5 mg total) by mouth daily. 90 tablet 3  . aspirin 81 MG tablet Take 81 mg by mouth daily.    Marland Kitchen BESIVANCE 0.6 % SUSP 4 drops daily for 2 days.  12  . calcium-vitamin D (OSCAL WITH D) 500-200 MG-UNIT tablet Take 1 tablet by mouth 2 (two) times daily.    Marland Kitchen denosumab (PROLIA) 60  MG/ML SOLN injection Inject 60 mg into the skin every 6 (six) months. Administer in upper arm, thigh, or abdomen    . ibuprofen (ADVIL) 200 MG tablet Take 200 mg by mouth every 6 (six) hours as needed.      . nystatin cream (MYCOSTATIN) Apply 1 application topically 2 (two) times daily. 30 g 3  . terconazole (TERAZOL 7) 0.4 % vaginal cream INSERT ONE APPLICATOR VAGINALLY EVERY 2 WEEKS 45 g 1  . triamcinolone ointment (KENALOG) 0.5 % Apply 1 application topically 2 (two) times daily. 30 g 0   No current facility-administered medications on file prior to visit.     BP 122/70 (BP Location: Right Arm, Patient Position: Sitting, Cuff Size: Normal)   Pulse 90   Temp 97.6 F (36.4 C) (Oral)   Ht 5' (1.524 m)   Wt 105 lb 6.4 oz (47.8 kg)   LMP 03/26/1979   SpO2 97%   BMI 20.58 kg/m   Subsequent Medicare wellness visit  1. Risk factors, based on past  M,S,F history.  Patient has a history of hypertension.  She has  remote history of tobacco use but discontinued in 1958 after only 3 years of light smoking.  2.  Physical activities: Remains quite active with yard work does walk 1-2 times per week.  No exercise restrictions  3.  Depression/mood: No history of major depression or mood disorder  4.  Hearing: No significant deficits  5.  ADL's: Independent   6.  Fall risk: Low  7.  Home safety: No problems identified  8.  Height weight, and visual acuity; height and weight stable followed by ophthalmology following a retinal vein occlusion  9.  Counseling:  Continue calcium and vitamin D supplements and active lifestyle 10. Lab orders based on risk factors: Will check TSH and lipid profile  11. Referral :  Follow-up hematology  and ophthalmology 12. Care plan: Continue efforts at aggressive risk factor modification  13. Cognitive assessment: Alert and appropriate with normal affect no cognitive dysfunction  14. Screening: Patient provided with a written and personalized 5-10 year screening schedule in the AVS.    15. Provider List Update: Hematology ophthalmology and primary care     Review of Systems  Constitutional: Negative.   HENT: Negative for congestion, dental problem, hearing loss, rhinorrhea, sinus pressure, sore throat and tinnitus.   Eyes: Negative for pain, discharge and visual disturbance.  Respiratory: Negative for cough and shortness of breath.   Cardiovascular: Negative for chest pain, palpitations and leg swelling.  Gastrointestinal: Negative for abdominal distention, abdominal pain, blood in stool, constipation, diarrhea, nausea and vomiting.  Genitourinary: Negative for difficulty urinating, dysuria, flank pain, frequency, hematuria, pelvic pain, urgency, vaginal bleeding, vaginal discharge and vaginal pain.  Musculoskeletal: Negative for arthralgias, gait problem and joint swelling.  Skin: Positive for rash.  Neurological: Negative for dizziness, syncope, speech  difficulty, weakness, numbness and headaches.  Hematological: Negative for adenopathy.  Psychiatric/Behavioral: Negative for agitation, behavioral problems and dysphoric mood. The patient is not nervous/anxious.        Objective:   Physical Exam  Constitutional: She is oriented to person, place, and time. She appears well-developed and well-nourished.  Weight 105 Blood pressure 122/70  HENT:  Head: Normocephalic and atraumatic.  Right Ear: External ear normal.  Left Ear: External ear normal.  Mouth/Throat: Oropharynx is clear and moist.  Eyes: Conjunctivae and EOM are normal.  Neck: Normal range of motion. Neck supple. No JVD present. No thyromegaly present.  Cardiovascular:  Normal rate, regular rhythm, normal heart sounds and intact distal pulses.  No murmur heard. Pulmonary/Chest: Effort normal and breath sounds normal. She has no wheezes. She has no rales.  Bilateral mastectomies  Abdominal: Soft. Bowel sounds are normal. She exhibits no distension and no mass. There is no tenderness. There is no rebound and no guarding.  Lower midline scar  Genitourinary: Vagina normal.  Musculoskeletal: Normal range of motion. She exhibits no edema or tenderness.  Neurological: She is alert and oriented to person, place, and time. She has normal reflexes. No cranial nerve deficit. She exhibits normal muscle tone. Coordination normal.  Skin: Skin is warm and dry. No rash noted.  Scattered erythematous maculopapular lesions most marked involving the left upper inner thigh.  Painless and nonpruritic as well as chronic (since 1986)  Psychiatric: She has a normal mood and affect. Her behavior is normal.          Assessment & Plan:   Preventive health examination Subsequent Medicare wellness visit Hypertension.  Presently well controlled Osteoporosis.  Continue Prolia calcium and vitamin D supplementation CLL.  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  Lesions are chronic and painless and  not very bothersome to the patient.  She has been using topical steroids sparingly with some benefit History of central retinal vein occlusion Dyslipidemia.  Will review a lipid profile  Nyoka Cowden

## 2017-06-11 NOTE — Patient Instructions (Signed)
Limit your sodium (Salt) intake    It is important that you exercise regularly, at least 20 minutes 3 to 4 times per week.  If you develop chest pain or shortness of breath seek  medical attention.  Take a calcium supplement, plus [phone] units of vitamin D  Please check your blood pressure on a regular basis.  If it is consistently greater than 150/90, please make an office appointment.  Return in one year for follow-up

## 2017-06-27 ENCOUNTER — Encounter (INDEPENDENT_AMBULATORY_CARE_PROVIDER_SITE_OTHER): Payer: Medicare Other | Admitting: Ophthalmology

## 2017-06-27 DIAGNOSIS — H35033 Hypertensive retinopathy, bilateral: Secondary | ICD-10-CM | POA: Diagnosis not present

## 2017-06-27 DIAGNOSIS — H43813 Vitreous degeneration, bilateral: Secondary | ICD-10-CM

## 2017-06-27 DIAGNOSIS — H2513 Age-related nuclear cataract, bilateral: Secondary | ICD-10-CM

## 2017-06-27 DIAGNOSIS — I10 Essential (primary) hypertension: Secondary | ICD-10-CM

## 2017-06-27 DIAGNOSIS — H348122 Central retinal vein occlusion, left eye, stable: Secondary | ICD-10-CM

## 2017-07-04 ENCOUNTER — Telehealth: Payer: Self-pay | Admitting: Gynecology

## 2017-07-04 NOTE — Telephone Encounter (Signed)
Complete exam 04/05/17 Dr. Phineas Real Dexa 06/05/16 Calcium 9.8   date 04/25/17  Deductible MET OOP MAX $4000 ($953 met) Office visit $40.00 which does not apply to pt  Administration fee $40.00 One dose of Prolia cost $50.00 Pt estimated  co pay total of $90.00   Appointment 07/25/17 at 12:00

## 2017-07-11 ENCOUNTER — Other Ambulatory Visit: Payer: Self-pay | Admitting: Gynecology

## 2017-07-25 ENCOUNTER — Ambulatory Visit (INDEPENDENT_AMBULATORY_CARE_PROVIDER_SITE_OTHER): Payer: Medicare Other | Admitting: *Deleted

## 2017-07-25 DIAGNOSIS — M810 Age-related osteoporosis without current pathological fracture: Secondary | ICD-10-CM

## 2017-07-26 MED ORDER — DENOSUMAB 60 MG/ML ~~LOC~~ SOLN
60.0000 mg | Freq: Once | SUBCUTANEOUS | Status: AC
  Administered 2017-07-25: 60 mg via SUBCUTANEOUS

## 2017-07-26 NOTE — Telephone Encounter (Signed)
Prolia Given 07/25/17 Next injection 01/25/18

## 2017-07-29 ENCOUNTER — Encounter: Payer: Self-pay | Admitting: Gynecology

## 2017-08-20 ENCOUNTER — Telehealth: Payer: Self-pay | Admitting: *Deleted

## 2017-08-20 NOTE — Telephone Encounter (Signed)
I do not think that it is related.  There is one complication called osteonecrosis of the jaw aware of the gums can received that would be a more long-term issue and extremely unusual.  I think at this point my recommendation is that if she then I would get her next shot when due and if it becomes a recurrent symptom we can talk about it may be switched to something different.  If there is any question as far as dental issues then I would recommend following up with her dentist also.

## 2017-08-20 NOTE — Telephone Encounter (Signed)
Patient had Prolia injection on 07/25/17, states about a week later she noticed when chewing she had discomfort, upper/lower teeth, lasted about 2 weeks. Discomfort was not sharp, only hurts when chewing food. No discomfort now, it has stopped, she hasn't had this happen before. She asked if it could have been related to injection? Please advise

## 2017-08-20 NOTE — Telephone Encounter (Signed)
Pt informed

## 2017-08-29 ENCOUNTER — Encounter (INDEPENDENT_AMBULATORY_CARE_PROVIDER_SITE_OTHER): Payer: Medicare Other | Admitting: Ophthalmology

## 2017-08-29 DIAGNOSIS — H43813 Vitreous degeneration, bilateral: Secondary | ICD-10-CM | POA: Diagnosis not present

## 2017-08-29 DIAGNOSIS — H34812 Central retinal vein occlusion, left eye, with macular edema: Secondary | ICD-10-CM

## 2017-08-29 DIAGNOSIS — H2513 Age-related nuclear cataract, bilateral: Secondary | ICD-10-CM

## 2017-08-29 DIAGNOSIS — I10 Essential (primary) hypertension: Secondary | ICD-10-CM | POA: Diagnosis not present

## 2017-08-29 DIAGNOSIS — H35033 Hypertensive retinopathy, bilateral: Secondary | ICD-10-CM | POA: Diagnosis not present

## 2017-10-24 ENCOUNTER — Inpatient Hospital Stay: Payer: Medicare Other

## 2017-10-24 ENCOUNTER — Telehealth: Payer: Self-pay | Admitting: Hematology and Oncology

## 2017-10-24 ENCOUNTER — Inpatient Hospital Stay: Payer: Medicare Other | Attending: Hematology and Oncology | Admitting: Hematology and Oncology

## 2017-10-24 ENCOUNTER — Other Ambulatory Visit: Payer: Self-pay | Admitting: Hematology and Oncology

## 2017-10-24 ENCOUNTER — Encounter: Payer: Self-pay | Admitting: Hematology and Oncology

## 2017-10-24 DIAGNOSIS — C9110 Chronic lymphocytic leukemia of B-cell type not having achieved remission: Secondary | ICD-10-CM

## 2017-10-24 DIAGNOSIS — D696 Thrombocytopenia, unspecified: Secondary | ICD-10-CM | POA: Diagnosis not present

## 2017-10-24 DIAGNOSIS — I10 Essential (primary) hypertension: Secondary | ICD-10-CM

## 2017-10-24 LAB — COMPREHENSIVE METABOLIC PANEL
ALBUMIN: 4.1 g/dL (ref 3.5–5.0)
ALK PHOS: 68 U/L (ref 40–150)
ALT: 21 U/L (ref 0–55)
ANION GAP: 8 (ref 3–11)
AST: 23 U/L (ref 5–34)
BUN: 17 mg/dL (ref 7–26)
CO2: 25 mmol/L (ref 22–29)
Calcium: 9.7 mg/dL (ref 8.4–10.4)
Chloride: 105 mmol/L (ref 98–109)
Creatinine, Ser: 0.82 mg/dL (ref 0.60–1.10)
GFR calc Af Amer: >60 mL/min (ref >60)
GFR calc non Af Amer: >60 mL/min (ref >60)
GLUCOSE: 88 mg/dL (ref 70–140)
POTASSIUM: 4.2 mmol/L (ref 3.5–5.1)
SODIUM: 138 mmol/L (ref 136–145)
Total Bilirubin: 0.8 mg/dL (ref 0.2–1.2)
Total Protein: 7 g/dL (ref 6.4–8.3)

## 2017-10-24 LAB — CBC WITH DIFFERENTIAL/PLATELET
BASOS PCT: 0 %
Basophils Absolute: 0.1 10*3/uL (ref 0.0–0.1)
EOS ABS: 0.1 10*3/uL (ref 0.0–0.5)
Eosinophils Relative: 0 %
HCT: 39.7 % (ref 34.8–46.6)
HEMOGLOBIN: 12.7 g/dL (ref 11.6–15.9)
Lymphocytes Relative: 93 %
Lymphs Abs: 76.5 10*3/uL — ABNORMAL HIGH (ref 0.9–3.3)
MCH: 33.1 pg (ref 25.1–34.0)
MCHC: 32.1 g/dL (ref 31.5–36.0)
MCV: 103.4 fL — ABNORMAL HIGH (ref 79.5–101.0)
MONOS PCT: 2 %
Monocytes Absolute: 1.8 10*3/uL — ABNORMAL HIGH (ref 0.1–0.9)
NEUTROS PCT: 5 %
Neutro Abs: 4.1 10*3/uL (ref 1.5–6.5)
Platelets: 129 10*3/uL — ABNORMAL LOW (ref 145–400)
RBC: 3.84 MIL/uL (ref 3.70–5.45)
RDW: 13.8 % (ref 11.2–14.5)
WBC: 82.6 10*3/uL — AB (ref 3.9–10.3)

## 2017-10-24 LAB — LACTATE DEHYDROGENASE: LDH: 188 U/L (ref 125–245)

## 2017-10-24 NOTE — Progress Notes (Signed)
[facility] OFFICE PROGRESS NOTE  Patient Care Team: Marletta Lor, MD as PCP - General  ASSESSMENT & PLAN:  Chronic lymphocytic leukemia Her CBC is stable She has no signs or symptoms that would warrant treatment at this point The patient is educated to watch out for signs and symptoms of disease progression Plan to see her back in 6 months with history, physical examination and blood work   Thrombocytopenia  Could be due to CLL. She is not symptomatic. Observe only.  Essential hypertension Her blood pressure is elevated but could be due to an element of whitecoat hypertension she will continue current medical management. I recommend close follow-up with primary care doctor for medication adjustment.     No orders of the defined types were placed in this encounter.   INTERVAL HISTORY: Please see below for problem oriented charting. She returns for further follow-up She had recent cataract surgery Her vision has improved She denies recent infection No new lymphadenopathy She denies recent abnormal weight loss, anorexia or night sweats The patient denies any recent signs or symptoms of bleeding such as spontaneous epistaxis, hematuria or hematochezia.   SUMMARY OF ONCOLOGIC HISTORY: Oncology History   Del 13 q     Chronic lymphocytic leukemia ([place])   03/15/2009 Pathology Results    Case #: [id number]  flow cytometry of peripheral blood comfirmed CLL.      04/25/2017 Pathology Results    FISH panel is positive for deletion 13 q only       REVIEW OF SYSTEMS:   Constitutional: Denies fevers, chills or abnormal weight loss Eyes: Denies blurriness of vision Ears, nose, mouth, throat, and face: Denies mucositis or sore throat Respiratory: Denies cough, dyspnea or wheezes Cardiovascular: Denies palpitation, chest discomfort or lower extremity swelling Gastrointestinal:  Denies nausea, heartburn or change in bowel habits Skin: Denies abnormal skin  rashes Lymphatics: Denies new lymphadenopathy or easy bruising Neurological:Denies numbness, tingling or new weaknesses Behavioral/Psych: Mood is stable, no new changes  All other systems were reviewed with the patient and are negative.  I have reviewed the past medical history, past surgical history, social history and family history with the patient and they are unchanged from previous note.  ALLERGIES:  is allergic to codeine and levofloxacin.  MEDICATIONS:  Current Outpatient Medications  Medication Sig Dispense Refill  . amLODipine (NORVASC) 2.5 MG tablet Take 1 tablet (2.5 mg total) by mouth daily. 90 tablet 3  . aspirin 81 MG tablet Take 81 mg by mouth daily.    Marland Kitchen BESIVANCE 0.6 % SUSP 4 drops daily for 2 days.  12  . calcium-vitamin D (OSCAL WITH D) 500-200 MG-UNIT tablet Take 1 tablet by mouth 2 (two) times daily.    Marland Kitchen denosumab (PROLIA) 60 MG/ML SOLN injection Inject 60 mg into the skin every 6 (six) months. Administer in upper arm, thigh, or abdomen    . ibuprofen (ADVIL) 200 MG tablet Take 200 mg by mouth every 6 (six) hours as needed.      . nystatin cream (MYCOSTATIN) Apply 1 application topically 2 (two) times daily. 30 g 3  . terconazole (TERAZOL 7) 0.4 % vaginal cream INSERT ONE APPLICATOR VAGINALLY EVERY 2 WEEKS 45 g 1  . triamcinolone ointment (KENALOG) 0.5 % Apply 1 application topically 2 (two) times daily. 30 g 0   No current facility-administered medications for this visit.     PHYSICAL EXAMINATION: ECOG PERFORMANCE STATUS: 0 - Asymptomatic  Vitals:   10/24/17 1153  BP: Marland Kitchen)  159/74  Pulse: 80  Resp: 18  Temp: 98.2 F (36.8 C)  SpO2: 100%   Filed Weights   10/24/17 1153  Weight: 104 lb 12.8 oz (47.5 kg)    GENERAL:alert, no distress and comfortable SKIN: skin color, texture, turgor are normal, no rashes or significant lesions EYES: normal, Conjunctiva are pink and non-injected, sclera clear OROPHARYNX:no exudate, no erythema and lips, buccal mucosa,  and tongue normal  NECK: supple, thyroid normal size, non-tender, without nodularity LYMPH:  no palpable lymphadenopathy in the cervical, axillary or inguinal LUNGS: clear to auscultation and percussion with normal breathing effort HEART: regular rate & rhythm and no murmurs and no lower extremity edema ABDOMEN:abdomen soft, non-tender and normal bowel sounds Musculoskeletal:no cyanosis of digits and no clubbing  NEURO: alert & oriented x 3 with fluent speech, no focal motor/sensory deficits  LABORATORY DATA:  I have reviewed the data as listed    Component Value Date/Time   NA 138 10/24/2017 1132   NA 141 04/25/2017 1041   K 4.2 10/24/2017 1132   K 4.6 04/25/2017 1041   CL 105 10/24/2017 1132   CL 104 10/27/2012 1209   CO2 25 10/24/2017 1132   CO2 26 04/25/2017 1041   GLUCOSE 88 10/24/2017 1132   GLUCOSE 97 04/25/2017 1041   GLUCOSE 104 (H) 10/27/2012 1209   BUN 17 10/24/2017 1132   BUN 13.1 04/25/2017 1041   CREATININE 0.82 10/24/2017 1132   CREATININE 0.8 04/25/2017 1041   CALCIUM 9.7 10/24/2017 1132   CALCIUM 9.8 04/25/2017 1041   PROT 7.0 10/24/2017 1132   PROT 7.5 04/25/2017 1041   ALBUMIN 4.1 10/24/2017 1132   ALBUMIN 4.4 04/25/2017 1041   AST 23 10/24/2017 1132   AST 23 04/25/2017 1041   ALT 21 10/24/2017 1132   ALT 14 04/25/2017 1041   ALKPHOS 68 10/24/2017 1132   ALKPHOS 69 04/25/2017 1041   BILITOT 0.8 10/24/2017 1132   BILITOT 0.95 04/25/2017 1041   GFRNONAA >60 10/24/2017 1132   GFRAA >60 10/24/2017 1132    No results found for: SPEP, UPEP  Lab Results  Component Value Date   WBC 82.6 (HH) 10/24/2017   NEUTROABS 4.1 10/24/2017   HGB 12.7 10/24/2017   HCT 39.7 10/24/2017   MCV 103.4 (H) 10/24/2017   PLT 129 (L) 10/24/2017      Chemistry      Component Value Date/Time   NA 138 10/24/2017 1132   NA 141 04/25/2017 1041   K 4.2 10/24/2017 1132   K 4.6 04/25/2017 1041   CL 105 10/24/2017 1132   CL 104 10/27/2012 1209   CO2 25 10/24/2017 1132    CO2 26 04/25/2017 1041   BUN 17 10/24/2017 1132   BUN 13.1 04/25/2017 1041   CREATININE 0.82 10/24/2017 1132   CREATININE 0.8 04/25/2017 1041      Component Value Date/Time   CALCIUM 9.7 10/24/2017 1132   CALCIUM 9.8 04/25/2017 1041   ALKPHOS 68 10/24/2017 1132   ALKPHOS 69 04/25/2017 1041   AST 23 10/24/2017 1132   AST 23 04/25/2017 1041   ALT 21 10/24/2017 1132   ALT 14 04/25/2017 1041   BILITOT 0.8 10/24/2017 1132   BILITOT 0.95 04/25/2017 1041     All questions were answered. The patient knows to call the clinic with any problems, questions or concerns. No barriers to learning was detected.  I spent 15 minutes counseling the patient face to face. The total time spent in the appointment was 20 minutes  and more than 50% was on counseling and review of test results  Heath Lark, MD 10/24/2017 12:24 PM

## 2017-10-24 NOTE — Telephone Encounter (Signed)
Gave avs and calendar ° °

## 2017-10-24 NOTE — Assessment & Plan Note (Signed)
Could be due to CLL. She is not symptomatic. Observe only. 

## 2017-10-24 NOTE — Assessment & Plan Note (Signed)
Her CBC is stable She has no signs or symptoms that would warrant treatment at this point The patient is educated to watch out for signs and symptoms of disease progression Plan to see her back in 6 months with history, physical examination and blood work  

## 2017-10-24 NOTE — Assessment & Plan Note (Signed)
Her blood pressure is elevated but could be due to an element of whitecoat hypertension she will continue current medical management. I recommend close follow-up with primary care doctor for medication adjustment.

## 2017-10-29 ENCOUNTER — Other Ambulatory Visit: Payer: Self-pay | Admitting: Gynecology

## 2017-10-31 ENCOUNTER — Other Ambulatory Visit: Payer: Self-pay | Admitting: Internal Medicine

## 2017-11-01 ENCOUNTER — Telehealth: Payer: Self-pay | Admitting: *Deleted

## 2017-11-01 MED ORDER — TERCONAZOLE 0.4 % VA CREA: TOPICAL_CREAM | VAGINAL | 2 refills | Status: DC

## 2017-11-01 NOTE — Telephone Encounter (Signed)
Okay to refill x2 

## 2017-11-01 NOTE — Telephone Encounter (Signed)
Pt informed, Rx sent. 

## 2017-11-01 NOTE — Telephone Encounter (Signed)
Pt called Terazol 7 day cream was denied pt said she uses 1 applicator vaginally every 2 weeks and has done this for years. Okay to refill?

## 2017-11-07 ENCOUNTER — Encounter (INDEPENDENT_AMBULATORY_CARE_PROVIDER_SITE_OTHER): Payer: Medicare Other | Admitting: Ophthalmology

## 2017-11-11 ENCOUNTER — Encounter (INDEPENDENT_AMBULATORY_CARE_PROVIDER_SITE_OTHER): Payer: Medicare Other | Admitting: Ophthalmology

## 2017-11-11 DIAGNOSIS — H35033 Hypertensive retinopathy, bilateral: Secondary | ICD-10-CM | POA: Diagnosis not present

## 2017-11-11 DIAGNOSIS — H43813 Vitreous degeneration, bilateral: Secondary | ICD-10-CM

## 2017-11-11 DIAGNOSIS — H34812 Central retinal vein occlusion, left eye, with macular edema: Secondary | ICD-10-CM

## 2017-11-11 DIAGNOSIS — I10 Essential (primary) hypertension: Secondary | ICD-10-CM | POA: Diagnosis not present

## 2017-11-20 ENCOUNTER — Telehealth: Payer: Self-pay | Admitting: *Deleted

## 2017-11-20 NOTE — Telephone Encounter (Signed)
Deductible amount met  OOP MAX $4000 ($124mt)  Annual exam 04/05/17  Calcium 9.7    Date 10/24/17  Upcoming dental procedures NO  Prior Authorization needed NO  Pt estimated Cost $90  appt 01/27/18 at 11:00     Coverage Details:The provider is in network for this patient's plan. Prolia is subject to a $50 co-pay. Whether office visit (OV) is billed or not, the patient will be responsible for a $40 co-pay which will cover admin and the OV. Copays do contribute to the OOP max of $4000 ($130 met). Once OOP max is met, copays will be waived and covered at 100% of the allowable rate. No referral is required.

## 2017-12-30 ENCOUNTER — Encounter (INDEPENDENT_AMBULATORY_CARE_PROVIDER_SITE_OTHER): Payer: Medicare Other | Admitting: Ophthalmology

## 2017-12-30 DIAGNOSIS — H43813 Vitreous degeneration, bilateral: Secondary | ICD-10-CM

## 2017-12-30 DIAGNOSIS — H34812 Central retinal vein occlusion, left eye, with macular edema: Secondary | ICD-10-CM

## 2017-12-30 DIAGNOSIS — I10 Essential (primary) hypertension: Secondary | ICD-10-CM

## 2017-12-30 DIAGNOSIS — H35033 Hypertensive retinopathy, bilateral: Secondary | ICD-10-CM | POA: Diagnosis not present

## 2018-01-13 ENCOUNTER — Telehealth: Payer: Self-pay | Admitting: Internal Medicine

## 2018-01-13 NOTE — Telephone Encounter (Signed)
Copied from [place] [id number]. Topic: Quick Communication - See Telephone Encounter >> Jan 13, 2018  2:35 PM Bea Graff, NT wrote: CRM for notification. See Telephone encounter for: 01/13/18. Pt is needing a prescription to be able to get some new bras and breast forms from where she had a double mastectomy years ago. She will like to pick up the rx when the prescription is ready.

## 2018-01-15 ENCOUNTER — Encounter: Payer: Self-pay | Admitting: Internal Medicine

## 2018-01-15 NOTE — Telephone Encounter (Signed)
Spoke to pt and she stated that she needs the Rx for insurance purposes. I informed pt that most people order it through the cancer center or a special store. Pt stated that it was filled before by [place] a couple of years back. I cannot find any Rx in the past for bra and breast forms.

## 2018-01-15 NOTE — Telephone Encounter (Signed)
Copied from [place] [id number]. Topic: Quick Communication - See Telephone Encounter >> Jan 13, 2018  2:35 PM Bea Graff, NT wrote: CRM for notification. See Telephone encounter for: 01/13/18. Pt is needing a prescription to be able to get some new bras and breast forms from where she had a double mastectomy years ago. She will like to pick up the rx when the prescription is ready.  >> Jan 15, 2018  2:56 PM Percell Belt A wrote: Pt was returning [place] call

## 2018-01-15 NOTE — Telephone Encounter (Signed)
Pt would like to pick up the Rx for her bras this afternoon if possible.  [phone]

## 2018-01-16 NOTE — Telephone Encounter (Signed)
Rx ready for pick up. Patient notified of results and verbalized understanding.

## 2018-01-27 ENCOUNTER — Ambulatory Visit (INDEPENDENT_AMBULATORY_CARE_PROVIDER_SITE_OTHER): Payer: Medicare Other | Admitting: Gynecology

## 2018-01-27 DIAGNOSIS — M810 Age-related osteoporosis without current pathological fracture: Secondary | ICD-10-CM

## 2018-01-27 MED ORDER — DENOSUMAB 60 MG/ML ~~LOC~~ SOSY
60.0000 mg | PREFILLED_SYRINGE | Freq: Once | SUBCUTANEOUS | Status: AC
  Administered 2018-01-27: 60 mg via SUBCUTANEOUS

## 2018-02-21 ENCOUNTER — Encounter (INDEPENDENT_AMBULATORY_CARE_PROVIDER_SITE_OTHER): Payer: Medicare Other | Admitting: Ophthalmology

## 2018-02-21 DIAGNOSIS — I10 Essential (primary) hypertension: Secondary | ICD-10-CM

## 2018-02-21 DIAGNOSIS — H35033 Hypertensive retinopathy, bilateral: Secondary | ICD-10-CM

## 2018-02-21 DIAGNOSIS — H34812 Central retinal vein occlusion, left eye, with macular edema: Secondary | ICD-10-CM | POA: Diagnosis not present

## 2018-02-21 DIAGNOSIS — H35371 Puckering of macula, right eye: Secondary | ICD-10-CM

## 2018-02-21 DIAGNOSIS — H43813 Vitreous degeneration, bilateral: Secondary | ICD-10-CM

## 2018-03-12 ENCOUNTER — Encounter: Payer: Self-pay | Admitting: Gynecology

## 2018-04-07 ENCOUNTER — Encounter: Payer: Self-pay | Admitting: Gynecology

## 2018-04-07 ENCOUNTER — Ambulatory Visit: Payer: Medicare Other | Admitting: Gynecology

## 2018-04-07 VITALS — BP 122/70 | Ht 60.0 in | Wt 102.0 lb

## 2018-04-07 DIAGNOSIS — N952 Postmenopausal atrophic vaginitis: Secondary | ICD-10-CM | POA: Diagnosis not present

## 2018-04-07 DIAGNOSIS — M810 Age-related osteoporosis without current pathological fracture: Secondary | ICD-10-CM

## 2018-04-07 DIAGNOSIS — Z01419 Encounter for gynecological examination (general) (routine) without abnormal findings: Secondary | ICD-10-CM | POA: Diagnosis not present

## 2018-04-07 DIAGNOSIS — Z853 Personal history of malignant neoplasm of breast: Secondary | ICD-10-CM | POA: Diagnosis not present

## 2018-04-07 NOTE — Progress Notes (Signed)
    April [name] 11/24/1934 [id number]        83 y.o.  G0P0 for annual gynecologic exam.  Continues on Prolia doing well.  Past medical history,surgical history, problem list, medications, allergies, family history and social history were all reviewed and documented as reviewed in the EPIC chart.  ROS:  Performed with pertinent positives and negatives included in the history, assessment and plan.   Additional significant findings : None   Exam: Copywriter, advertising Vitals:   04/07/18 1130  BP: 122/70  Weight: 102 lb (46.3 kg)  Height: 5' (1.524 m)   Body mass index is 19.92 kg/m.  General appearance:  Normal affect, orientation and appearance. Skin: Grossly normal HEENT: Without gross lesions.  No cervical or supraclavicular adenopathy. Thyroid normal.  Lungs:  Clear without wheezing, rales or rhonchi Cardiac: RR, without RMG Abdominal:  Soft, nontender, without masses, guarding, rebound, organomegaly or hernia Breasts:  Examined lying and sitting status post bilateral mastectomies.  Chest wall without masses or axillary adenopathy.  Pelvic:  Ext, BUS, Vagina: With atrophic changes  Adnexa: Without masses or tenderness    Anus and perineum: Normal   Rectovaginal: Normal sphincter tone without palpated masses or tenderness.    Assessment/Plan:  83 y.o. G0P0 female for annual gynecologic exam.  History of abdominal hysterectomy with BSO.  1. Postmenopausal/atrophic genital changes.  No significant menopausal symptoms. 2. Osteoporosis.  DEXA 2017 T score -3.  On Prolia for 6 years.  We discussed about continuing Prolia or switching to another antiresorptive.  She is having no significant side effects from the Prolia and we both agree to continue this for now.  Plan repeat DEXA end of this year at 2-year interval. 3. Pap smear 2011.  No Pap smear done today.  We both agree to stop screening per current screening guidelines based on hysterectomy and age.  No history of abnormal Pap  smears. 4. Colonoscopy over 10 years ago.  Will follow-up with primary physician in reference to colon screening. 5. History of breast cancer.  Status post bilateral mastectomies.  Exam NED. 6. Health maintenance.  No routine lab work done as patient does this elsewhere.  Follow-up for DEXA otherwise follow-up in 1 year for annual exam.   [name] MD, 11:53 AM 04/07/2018

## 2018-04-07 NOTE — Patient Instructions (Signed)
Followup for bone density as scheduled. 

## 2018-04-14 ENCOUNTER — Other Ambulatory Visit: Payer: Self-pay | Admitting: Gynecology

## 2018-04-24 ENCOUNTER — Encounter: Payer: Self-pay | Admitting: Hematology and Oncology

## 2018-04-24 ENCOUNTER — Inpatient Hospital Stay: Payer: Medicare Other

## 2018-04-24 ENCOUNTER — Telehealth: Payer: Self-pay | Admitting: Hematology and Oncology

## 2018-04-24 ENCOUNTER — Other Ambulatory Visit: Payer: Self-pay | Admitting: Hematology and Oncology

## 2018-04-24 ENCOUNTER — Encounter (INDEPENDENT_AMBULATORY_CARE_PROVIDER_SITE_OTHER): Payer: Medicare Other | Admitting: Ophthalmology

## 2018-04-24 ENCOUNTER — Inpatient Hospital Stay: Payer: Medicare Other | Attending: Hematology and Oncology | Admitting: Hematology and Oncology

## 2018-04-24 VITALS — BP 155/67 | HR 67 | Temp 98.2°F | Resp 18 | Ht 60.0 in | Wt 101.0 lb

## 2018-04-24 DIAGNOSIS — I10 Essential (primary) hypertension: Secondary | ICD-10-CM

## 2018-04-24 DIAGNOSIS — L982 Febrile neutrophilic dermatosis [Sweet]: Secondary | ICD-10-CM

## 2018-04-24 DIAGNOSIS — C9110 Chronic lymphocytic leukemia of B-cell type not having achieved remission: Secondary | ICD-10-CM

## 2018-04-24 DIAGNOSIS — Z23 Encounter for immunization: Secondary | ICD-10-CM | POA: Diagnosis present

## 2018-04-24 DIAGNOSIS — Z0000 Encounter for general adult medical examination without abnormal findings: Secondary | ICD-10-CM

## 2018-04-24 DIAGNOSIS — Z299 Encounter for prophylactic measures, unspecified: Secondary | ICD-10-CM

## 2018-04-24 LAB — COMPREHENSIVE METABOLIC PANEL
ALT: 24 U/L (ref 0–44)
AST: 27 U/L (ref 15–41)
Albumin: 4.2 g/dL (ref 3.5–5.0)
Alkaline Phosphatase: 77 U/L (ref 38–126)
Anion gap: 7 (ref 5–15)
BUN: 17 mg/dL (ref 8–23)
CHLORIDE: 104 mmol/L (ref 98–111)
CO2: 28 mmol/L (ref 22–32)
Calcium: 9.6 mg/dL (ref 8.9–10.3)
Creatinine, Ser: 0.81 mg/dL (ref 0.44–1.00)
Glucose, Bld: 90 mg/dL (ref 70–99)
POTASSIUM: 4.2 mmol/L (ref 3.5–5.1)
Sodium: 139 mmol/L (ref 135–145)
TOTAL PROTEIN: 7 g/dL (ref 6.5–8.1)
Total Bilirubin: 1 mg/dL (ref 0.3–1.2)

## 2018-04-24 LAB — CBC WITH DIFFERENTIAL/PLATELET
BASOS ABS: 0.1 10*3/uL (ref 0.0–0.1)
Basophils Relative: 0 %
Eosinophils Absolute: 0.2 10*3/uL (ref 0.0–0.5)
Eosinophils Relative: 0 %
HEMATOCRIT: 39.9 % (ref 34.8–46.6)
Hemoglobin: 12.7 g/dL (ref 11.6–15.9)
LYMPHS ABS: 80.7 10*3/uL — AB (ref 0.9–3.3)
LYMPHS PCT: 94 %
MCH: 33.3 pg (ref 25.1–34.0)
MCHC: 31.8 g/dL (ref 31.5–36.0)
MCV: 104.7 fL — AB (ref 79.5–101.0)
Monocytes Absolute: 1.6 10*3/uL — ABNORMAL HIGH (ref 0.1–0.9)
Monocytes Relative: 2 %
NEUTROS PCT: 4 %
Neutro Abs: 3.2 10*3/uL (ref 1.5–6.5)
Platelets: 113 10*3/uL — ABNORMAL LOW (ref 145–400)
RBC: 3.81 MIL/uL (ref 3.70–5.45)
RDW: 13.8 % (ref 11.2–14.5)
WBC: 85.8 10*3/uL — AB (ref 3.9–10.3)

## 2018-04-24 LAB — LACTATE DEHYDROGENASE: LDH: 166 U/L (ref 98–192)

## 2018-04-24 MED ORDER — INFLUENZA VAC SPLIT QUAD 0.5 ML IM SUSY
0.5000 mL | PREFILLED_SYRINGE | Freq: Once | INTRAMUSCULAR | Status: AC
  Administered 2018-04-24: 0.5 mL via INTRAMUSCULAR

## 2018-04-24 NOTE — Assessment & Plan Note (Signed)
Her CBC is stable She has no signs or symptoms that would warrant treatment at this point The patient is educated to watch out for signs and symptoms of disease progression Plan to see her back in 6 months with history, physical examination and blood work

## 2018-04-24 NOTE — Telephone Encounter (Signed)
Gave patient avs and calendar.   °

## 2018-04-24 NOTE — Progress Notes (Signed)
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  Essential hypertension Her blood pressure is elevated but could be due to an element of whitecoat hypertension she will continue current medical management. I recommend close follow-up with primary care doctor  Preventive measure We discussed the importance of preventive care and reviewed the vaccination programs. She does not have any prior allergic reactions to influenza vaccination. She agrees to proceed with influenza vaccination today and we will administer it today at the clinic.    No orders of the defined types were placed in this encounter.   INTERVAL HISTORY: Please see below for problem oriented charting. She returns for CLL follow-up She feels well Denies abnormal appetite, weight loss or night sweats No new lymphadenopathy Denies recent infection She continues to have persistent skin rash in the legs, stable but it does not bother her.  SUMMARY OF ONCOLOGIC HISTORY: Oncology History   Del 13 q     Chronic lymphocytic leukemia ([place])   03/15/2009 Pathology Results    Case #: [id number]  flow cytometry of peripheral blood comfirmed CLL.    04/25/2017 Pathology Results    FISH panel is positive for deletion 13 q only     REVIEW OF SYSTEMS:   Constitutional: Denies fevers, chills or abnormal weight loss Eyes: Denies blurriness of vision Ears,  nose, mouth, throat, and face: Denies mucositis or sore throat Respiratory: Denies cough, dyspnea or wheezes Cardiovascular: Denies palpitation, chest discomfort or lower extremity swelling Gastrointestinal:  Denies nausea, heartburn or change in bowel habits Lymphatics: Denies new lymphadenopathy or easy bruising Neurological:Denies numbness, tingling or new weaknesses Behavioral/Psych: Mood is stable, no new changes  All other systems were reviewed with the patient and are negative.  I have reviewed the past medical history, past surgical history, social history and family history with the patient and they are unchanged from previous note.  ALLERGIES:  is allergic to codeine and levofloxacin.  MEDICATIONS:  Current Outpatient Medications  Medication Sig Dispense Refill  . amLODipine (NORVASC) 2.5 MG tablet TAKE 1 TABLET (2.5 MG TOTAL) BY MOUTH DAILY. 90 tablet 3  . aspirin 81 MG tablet Take 81 mg by mouth daily.    Marland Kitchen BESIVANCE 0.6 % SUSP 4 drops daily for 2 days.  12  . Bevacizumab (AVASTIN) 100 MG/4ML SOLN Inject into the vein.    . calcium-vitamin D (OSCAL WITH D) 500-200 MG-UNIT tablet Take 1 tablet by mouth 2 (two) times daily.    Marland Kitchen denosumab (PROLIA) 60 MG/ML SOLN injection Inject 60 mg into the skin every 6 (six) months. Administer in upper arm, thigh, or abdomen    . ibuprofen (ADVIL) 200 MG tablet Take 200 mg by mouth every 6 (six) hours as needed.      . nystatin cream (MYCOSTATIN) Apply 1 application topically 2 (two) times daily. 30 g 3  . terconazole (TERAZOL 7) 0.4 % vaginal cream INSERT ONE APPLICATOR  VAGINALLY EVERY 2 WEEKS 45 g 3  . triamcinolone ointment (KENALOG) 0.5 % Apply 1 application topically 2 (two) times daily. 30 g 0   No current facility-administered medications for this visit.     PHYSICAL EXAMINATION: ECOG PERFORMANCE STATUS: 1 - Symptomatic but completely ambulatory  Vitals:   04/24/18 1139  BP: (!) 155/67  Pulse: 67  Resp: 18  Temp: 98.2 F  (36.8 C)  SpO2: 100%   Filed Weights   04/24/18 1139  Weight: 101 lb (45.8 kg)    GENERAL:alert, no distress and comfortable SKIN: She has persistent skin rash most consistent with Sweet syndrome EYES: normal, Conjunctiva are pink and non-injected, sclera clear OROPHARYNX:no exudate, no erythema and lips, buccal mucosa, and tongue normal  NECK: supple, thyroid normal size, non-tender, without nodularity LYMPH:  no palpable lymphadenopathy in the cervical, axillary or inguinal LUNGS: clear to auscultation and percussion with normal breathing effort HEART: regular rate & rhythm and no murmurs and no lower extremity edema ABDOMEN:abdomen soft, non-tender and normal bowel sounds Musculoskeletal:no cyanosis of digits and no clubbing  NEURO: alert & oriented x 3 with fluent speech, no focal motor/sensory deficits  LABORATORY DATA:  I have reviewed the data as listed    Component Value Date/Time   NA 139 04/24/2018 1113   NA 141 04/25/2017 1041   K 4.2 04/24/2018 1113   K 4.6 04/25/2017 1041   CL 104 04/24/2018 1113   CL 104 10/27/2012 1209   CO2 28 04/24/2018 1113   CO2 26 04/25/2017 1041   GLUCOSE 90 04/24/2018 1113   GLUCOSE 97 04/25/2017 1041   GLUCOSE 104 (H) 10/27/2012 1209   BUN 17 04/24/2018 1113   BUN 13.1 04/25/2017 1041   CREATININE 0.81 04/24/2018 1113   CREATININE 0.8 04/25/2017 1041   CALCIUM 9.6 04/24/2018 1113   CALCIUM 9.8 04/25/2017 1041   PROT 7.0 04/24/2018 1113   PROT 7.5 04/25/2017 1041   ALBUMIN 4.2 04/24/2018 1113   ALBUMIN 4.4 04/25/2017 1041   AST 27 04/24/2018 1113   AST 23 04/25/2017 1041   ALT 24 04/24/2018 1113   ALT 14 04/25/2017 1041   ALKPHOS 77 04/24/2018 1113   ALKPHOS 69 04/25/2017 1041   BILITOT 1.0 04/24/2018 1113   BILITOT 0.95 04/25/2017 1041   GFRNONAA >60 04/24/2018 1113   GFRAA >60 04/24/2018 1113    No results found for: SPEP, UPEP  Lab Results  Component Value Date   WBC 85.8 (HH) 04/24/2018   NEUTROABS 3.2  04/24/2018   HGB 12.7 04/24/2018   HCT 39.9 04/24/2018   MCV 104.7 (H) 04/24/2018   PLT 113 (L) 04/24/2018      Chemistry      Component Value Date/Time   NA 139 04/24/2018 1113   NA 141 04/25/2017 1041   K 4.2 04/24/2018 1113   K 4.6 04/25/2017 1041   CL 104 04/24/2018 1113   CL 104 10/27/2012 1209   CO2 28 04/24/2018 1113   CO2 26 04/25/2017 1041   BUN 17 04/24/2018 1113   BUN 13.1 04/25/2017 1041   CREATININE 0.81 04/24/2018 1113   CREATININE 0.8 04/25/2017 1041      Component Value Date/Time   CALCIUM 9.6 04/24/2018 1113   CALCIUM 9.8 04/25/2017 1041   ALKPHOS 77 04/24/2018 1113   ALKPHOS 69 04/25/2017 1041   AST 27 04/24/2018 1113   AST 23 04/25/2017 1041   ALT 24 04/24/2018 1113   ALT 14 04/25/2017 1041   BILITOT 1.0 04/24/2018  1113   BILITOT 0.95 04/25/2017 1041       All questions were answered. The patient knows to call the clinic with any problems, questions or concerns. No barriers to learning was detected.  I spent 15 minutes counseling the patient face to face. The total time spent in the appointment was 20 minutes and more than 50% was on counseling and review of test results  Heath Lark, MD 04/24/2018 12:44 PM

## 2018-04-24 NOTE — Assessment & Plan Note (Signed)
The skin rash on her legs are most likely related to her underlying malignancy I recommend follow-up with dermatologist  We discussed the role of prednisone but the patient declined for now. 

## 2018-04-24 NOTE — Assessment & Plan Note (Signed)
We discussed the importance of preventive care and reviewed the vaccination programs. She does not have any prior allergic reactions to influenza vaccination. She agrees to proceed with influenza vaccination today and we will administer it today at the clinic.  

## 2018-04-24 NOTE — Assessment & Plan Note (Addendum)
Her blood pressure is elevated but could be due to an element of whitecoat hypertension she will continue current medical management. I recommend close follow-up with primary care doctor

## 2018-04-25 ENCOUNTER — Encounter (INDEPENDENT_AMBULATORY_CARE_PROVIDER_SITE_OTHER): Payer: Medicare Other | Admitting: Ophthalmology

## 2018-04-25 DIAGNOSIS — H348122 Central retinal vein occlusion, left eye, stable: Secondary | ICD-10-CM

## 2018-04-25 DIAGNOSIS — H43813 Vitreous degeneration, bilateral: Secondary | ICD-10-CM

## 2018-04-25 DIAGNOSIS — I10 Essential (primary) hypertension: Secondary | ICD-10-CM | POA: Diagnosis not present

## 2018-04-25 DIAGNOSIS — H35033 Hypertensive retinopathy, bilateral: Secondary | ICD-10-CM

## 2018-06-09 ENCOUNTER — Ambulatory Visit (INDEPENDENT_AMBULATORY_CARE_PROVIDER_SITE_OTHER): Payer: Medicare Other

## 2018-06-09 ENCOUNTER — Other Ambulatory Visit: Payer: Self-pay | Admitting: Gynecology

## 2018-06-09 DIAGNOSIS — M810 Age-related osteoporosis without current pathological fracture: Secondary | ICD-10-CM

## 2018-06-10 ENCOUNTER — Encounter: Payer: Self-pay | Admitting: Gynecology

## 2018-06-16 ENCOUNTER — Encounter: Payer: Self-pay | Admitting: Internal Medicine

## 2018-06-19 ENCOUNTER — Encounter: Payer: Self-pay | Admitting: Internal Medicine

## 2018-06-19 ENCOUNTER — Ambulatory Visit: Payer: Medicare Other | Admitting: Internal Medicine

## 2018-06-19 VITALS — BP 110/72 | HR 89 | Temp 98.2°F | Ht 60.0 in | Wt 102.8 lb

## 2018-06-19 DIAGNOSIS — C9110 Chronic lymphocytic leukemia of B-cell type not having achieved remission: Secondary | ICD-10-CM

## 2018-06-19 DIAGNOSIS — Z853 Personal history of malignant neoplasm of breast: Secondary | ICD-10-CM | POA: Insufficient documentation

## 2018-06-19 DIAGNOSIS — H348122 Central retinal vein occlusion, left eye, stable: Secondary | ICD-10-CM

## 2018-06-19 DIAGNOSIS — D696 Thrombocytopenia, unspecified: Secondary | ICD-10-CM

## 2018-06-19 DIAGNOSIS — C50919 Malignant neoplasm of unspecified site of unspecified female breast: Secondary | ICD-10-CM | POA: Diagnosis not present

## 2018-06-19 DIAGNOSIS — M810 Age-related osteoporosis without current pathological fracture: Secondary | ICD-10-CM

## 2018-06-19 DIAGNOSIS — Z0000 Encounter for general adult medical examination without abnormal findings: Secondary | ICD-10-CM | POA: Diagnosis not present

## 2018-06-19 DIAGNOSIS — I10 Essential (primary) hypertension: Secondary | ICD-10-CM

## 2018-06-19 NOTE — Progress Notes (Signed)
Preventive Wellness Office Visit     CC/Reason for Visit: Preventive wellness visit  HPI: April [name] is a 83 y.o. female who is coming in today for the above mentioned reasons. Past Medical History is significant for: Remote history of breast cancer in the 80s status post bilateral mastectomies, CLL with mild thrombocytopenia followed by Dr. Alvy Bimler, history of well-controlled hypertension, osteoporosis on Prolia every 6 months administered at her [place] office, central retinal vein occlusion of the left eye followed by ophthalmology.  She has no acute complaints at today's visit.  She is still living independently with her husband, ambulates without assistance.  Health Maintenance:  -PAP: Aged out -Mammo: Status post bilateral mastectomies -Colon Cancer: Aged out -Lung Ca (ages 69-80 with 74 pack-yr history or quit past 15 yrs): Never smoker -DEXA: Done in 11/19 documents stability of osteoporosis. -Vaccines: Tdap (7/10), PCV13 (6/16), PPSV23 (10/14), RZV (not a candidate due to immune suppression), MMR if born after 1957 if titers negative (not indicated) -HIV: Not indicated -HIV PrEP if high risk: Not indicated -BP: Currently being managed for hypertension -Depression: Not depressed -DM: Fasting blood sugar to be drawn this visit -Hep C (adults born between 18-1965): Not indicated -Tobacco cessation: Not indicated -Healthy Diet and Exercise: Discussed mediterranean diet and at least 30 minutes of aerobic exercise daily. -Eye exam: Follows regularly with her ophthalmologist due to her history of central retinal vein occlusion of the left eye    Past Medical/Surgical History: Past Medical History:  Diagnosis Date  . Breast CA ([place])   . Bronchitis, chronic ([place])   . Eye hemorrhage, left   . Leukemia, chronic lymphoid ([place])   . 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 Osteoporosis 05/2018   T score -3.1 overall stable from prior study    Past  Surgical History:  Procedure Laterality Date  . ABDOMINAL HYSTERECTOMY  1980  . BREAST IMPLANTS REMOVED  2001  . BREAST SURGERY     Bilateral mastectomy  . MASTECTOMY  1982   BILATERAL  . OOPHORECTOMY     BSO  . PARTIAL COLECTOMY     INTESTINAL ABSCESS WITH SALPINGECTOMY  . RECONSTRUCTION LEFT ELBOW    . TONSILLECTOMY    . UMBILLICAL HERNIA REPAIR      Social History:  reports that she quit smoking about 63 years ago. Her smoking use included cigarettes. She has never used smokeless tobacco. She reports that she drinks about 5.0 standard drinks of alcohol per week. She reports that she does not use drugs.  Allergies: Allergies  Allergen Reactions  . Codeine Nausea Only  . Levofloxacin Rash    Family History:  Family History  Problem Relation Age of Onset  . Cancer Brother        HODGKINS  . Cancer Brother        leukemia  . Parkinson's disease Brother   . Heart disease Mother   . Heart disease Sister      Current Outpatient Medications:  .  amLODipine (NORVASC) 2.5 MG tablet, TAKE 1 TABLET (2.5 MG TOTAL) BY MOUTH DAILY., Disp: 90 tablet, Rfl: 3 .  aspirin 81 MG tablet, Take 81 mg by mouth daily., Disp: , Rfl:  .  augmented betamethasone dipropionate (DIPROLENE-AF) 3.15 % cream, 1 APPLICATION APPLY ON THE SKIN TWICE A DAY APPLY TO AFFECTED AREAS AS NEEDED FLARES, Disp: , Rfl: 2 .  BESIVANCE 0.6 % SUSP, 4 drops daily for 2 days., Disp: ,  Rfl: 12 .  Bevacizumab (AVASTIN) 100 MG/4ML SOLN, Inject into the vein., Disp: , Rfl:  .  calcium-vitamin D (OSCAL WITH D) 500-200 MG-UNIT tablet, Take 1 tablet by mouth 2 (two) times daily., Disp: , Rfl:  .  denosumab (PROLIA) 60 MG/ML SOLN injection, Inject 60 mg into the skin every 6 (six) months. Administer in upper arm, thigh, or abdomen, Disp: , Rfl:  .  ibuprofen (ADVIL) 200 MG tablet, Take 200 mg by mouth every 6 (six) hours as needed.  , Disp: , Rfl:  .  nystatin cream (MYCOSTATIN), Apply 1 application topically 2 (two) times  daily., Disp: 30 g, Rfl: 3 .  terconazole (TERAZOL 7) 0.4 % vaginal cream, INSERT ONE APPLICATOR VAGINALLY EVERY 2 WEEKS, Disp: 45 g, Rfl: 3 .  triamcinolone ointment (KENALOG) 0.5 %, Apply 1 application topically 2 (two) times daily., Disp: 30 g, Rfl: 0  Review of Systems:  Constitutional: Denies fever, chills, diaphoresis, appetite change and fatigue.  HEENT: Denies photophobia, eye pain, redness, hearing loss, ear pain, congestion, sore throat, rhinorrhea, sneezing, mouth sores, trouble swallowing, neck pain, neck stiffness and tinnitus.   Respiratory: Denies SOB, DOE, cough, chest tightness,  and wheezing.   Cardiovascular: Denies chest pain, palpitations and leg swelling.  Gastrointestinal: Denies nausea, vomiting, abdominal pain, diarrhea, constipation, blood in stool and abdominal distention.  Genitourinary: Denies dysuria, urgency, frequency, hematuria, flank pain and difficulty urinating.  Endocrine: Denies: hot or cold intolerance, sweats, changes in hair or nails, polyuria, polydipsia. Musculoskeletal: Denies myalgias, back pain, joint swelling, arthralgias and gait problem.  Skin: Denies pallor, rash and wound.  Neurological: Denies dizziness, seizures, syncope, weakness, light-headedness, numbness and headaches.  Hematological: Denies adenopathy. Easy bruising, personal or family bleeding history  Psychiatric/Behavioral: Denies suicidal ideation, mood changes, confusion, nervousness, sleep disturbance and agitation    Physical Exam: Vitals:   06/19/18 1541  BP: 110/72  Pulse: 89  Temp: 98.2 F (36.8 C)  TempSrc: Oral  SpO2: 95%  Weight: 102 lb 12.8 oz (46.6 kg)  Height: 5' (1.524 m)    Body mass index is 20.08 kg/m.    Constitutional: NAD, calm, comfortable Eyes: PERRL, lids and conjunctivae normal ENMT: Mucous membranes are moist. Posterior pharynx clear of any exudate or lesions. Normal dentition. Tympanic membrane is pearly white, no erythema or bulging. Neck:  normal, supple, no masses, no thyromegaly Respiratory: clear to auscultation bilaterally, no wheezing, no crackles. Normal respiratory effort. No accessory muscle use.  Cardiovascular: Regular rate and rhythm, no murmurs / rubs / gallops. No extremity edema. 2+ pedal pulses. No carotid bruits.  Abdomen: no tenderness, no masses palpated. No hepatosplenomegaly. Bowel sounds positive.  Musculoskeletal: no clubbing / cyanosis. No joint deformity upper and lower extremities. Good ROM, no contractures. Normal muscle tone.  Skin: Significant varicose veins. Neurologic: CN 2-12 grossly intact. Sensation intact, DTR normal. Strength 5/5 in all 4.  Psychiatric: Normal judgment and insight. Alert and oriented x 3. Normal mood.    Impression and Plan:  Encounter for preventive health examination -All health maintenance issues addressed as above. -Will return tomorrow morning for fasting labs to include: CBC with differential, CMP, fasting lipid profile, TSH, B12, vitamin D.  Malignant neoplasm of female breast, unspecified estrogen receptor status, unspecified laterality, unspecified site of breast ([place]) -Status post bilateral mastectomy  Osteoporosis without current pathological fracture, unspecified osteoporosis type -On Prolia every 6 months. -Recent DEXA document stability.  Chronic lymphocytic leukemia ([place]) -Followed by oncology  Thrombocytopenia ([place]) -Likely due to CLL, followed  by oncology, for CBC today  Essential hypertension -Well-controlled this visit. -On Norvasc 2.5   Central retinal vein occlusion of left eye, unspecified complication status -Follow with ophthalmology as scheduled.    Patient Instructions  BEFORE YOU LEAVE: -Labs today  -Follow up: With me in 6 months for blood pressure management.    Preventive Care 28 Years and Older, Female Preventive care refers to lifestyle choices and visits with your health care provider that can promote health and  wellness. What does preventive care include?  A yearly physical exam. This is also called an annual well check.  Dental exams once or twice a year.  Routine eye exams. Ask your health care provider how often you should have your eyes checked.  Personal lifestyle choices, including: ? Daily care of your teeth and gums. ? Regular physical activity. ? Eating a healthy diet. ? Avoiding tobacco and drug use. ? Limiting alcohol use. ? Practicing safe sex. ? Taking low-dose aspirin every day. ? Taking vitamin and mineral supplements as recommended by your health care provider. What happens during an annual well check? The services and screenings done by your health care provider during your annual well check will depend on your age, overall health, lifestyle risk factors, and family history of disease. Counseling Your health care provider may ask you questions about your:  Alcohol use.  Tobacco use.  Drug use.  Emotional well-being.  Home and relationship well-being.  Sexual activity.  Eating habits.  History of falls.  Memory and ability to understand (cognition).  Work and work Statistician.  Reproductive health.  Screening You may have the following tests or measurements:  Height, weight, and BMI.  Blood pressure.  Lipid and cholesterol levels. These may be checked every 5 years, or more frequently if you are over 2 years old.  Skin check.  Lung cancer screening. You may have this screening every year starting at age 15 if you have a 30-pack-year history of smoking and currently smoke or have quit within the past 15 years.  Fecal occult blood test (FOBT) of the stool. You may have this test every year starting at age 8.  Flexible sigmoidoscopy or colonoscopy. You may have a sigmoidoscopy every 5 years or a colonoscopy every 10 years starting at age 44.  Hepatitis C blood test.  Hepatitis B blood test.  Sexually transmitted disease (STD)  testing.  Diabetes screening. This is done by checking your blood sugar (glucose) after you have not eaten for a while (fasting). You may have this done every 1-3 years.  Bone density scan. This is done to screen for osteoporosis. You may have this done starting at age 34.  Mammogram. This may be done every 1-2 years. Talk to your health care provider about how often you should have regular mammograms.  Talk with your health care provider about your test results, treatment options, and if necessary, the need for more tests. Vaccines Your health care provider may recommend certain vaccines, such as:  Influenza vaccine. This is recommended every year.  Tetanus, diphtheria, and acellular pertussis (Tdap, Td) vaccine. You may need a Td booster every 10 years.  Varicella vaccine. You may need this if you have not been vaccinated.  Zoster vaccine. You may need this after age 95.  Measles, mumps, and rubella (MMR) vaccine. You may need at least one dose of MMR if you were born in 1957 or later. You may also need a second dose.  Pneumococcal 13-valent conjugate (PCV13) vaccine. One  dose is recommended after age 38.  Pneumococcal polysaccharide (PPSV23) vaccine. One dose is recommended after age 19.  Meningococcal vaccine. You may need this if you have certain conditions.  Hepatitis A vaccine. You may need this if you have certain conditions or if you travel or work in places where you may be exposed to hepatitis A.  Hepatitis B vaccine. You may need this if you have certain conditions or if you travel or work in places where you may be exposed to hepatitis B.  Haemophilus influenzae type b (Hib) vaccine. You may need this if you have certain conditions.  Talk to your health care provider about which screenings and vaccines you need and how often you need them. This information is not intended to replace advice given to you by your health care provider. Make sure you discuss any questions  you have with your health care provider. Document Released: 08/12/2015 Document Revised: 04/04/2016 Document Reviewed: 05/17/2015 Elsevier Interactive Patient Education  2018 [place], [place] Jacklynn [place]

## 2018-06-19 NOTE — Patient Instructions (Signed)
BEFORE YOU LEAVE: -Labs today  -Follow up: With me in 6 months for blood pressure management.    Preventive Care 81 Years and Older, Female Preventive care refers to lifestyle choices and visits with your health care provider that can promote health and wellness. What does preventive care include?  A yearly physical exam. This is also called an annual well check.  Dental exams once or twice a year.  Routine eye exams. Ask your health care provider how often you should have your eyes checked.  Personal lifestyle choices, including: ? Daily care of your teeth and gums. ? Regular physical activity. ? Eating a healthy diet. ? Avoiding tobacco and drug use. ? Limiting alcohol use. ? Practicing safe sex. ? Taking low-dose aspirin every day. ? Taking vitamin and mineral supplements as recommended by your health care provider. What happens during an annual well check? The services and screenings done by your health care provider during your annual well check will depend on your age, overall health, lifestyle risk factors, and family history of disease. Counseling Your health care provider may ask you questions about your:  Alcohol use.  Tobacco use.  Drug use.  Emotional well-being.  Home and relationship well-being.  Sexual activity.  Eating habits.  History of falls.  Memory and ability to understand (cognition).  Work and work Statistician.  Reproductive health.  Screening You may have the following tests or measurements:  Height, weight, and BMI.  Blood pressure.  Lipid and cholesterol levels. These may be checked every 5 years, or more frequently if you are over 46 years old.  Skin check.  Lung cancer screening. You may have this screening every year starting at age 44 if you have a 30-pack-year history of smoking and currently smoke or have quit within the past 15 years.  Fecal occult blood test (FOBT) of the stool. You may have this test every year  starting at age 73.  Flexible sigmoidoscopy or colonoscopy. You may have a sigmoidoscopy every 5 years or a colonoscopy every 10 years starting at age 30.  Hepatitis C blood test.  Hepatitis B blood test.  Sexually transmitted disease (STD) testing.  Diabetes screening. This is done by checking your blood sugar (glucose) after you have not eaten for a while (fasting). You may have this done every 1-3 years.  Bone density scan. This is done to screen for osteoporosis. You may have this done starting at age 25.  Mammogram. This may be done every 1-2 years. Talk to your health care provider about how often you should have regular mammograms.  Talk with your health care provider about your test results, treatment options, and if necessary, the need for more tests. Vaccines Your health care provider may recommend certain vaccines, such as:  Influenza vaccine. This is recommended every year.  Tetanus, diphtheria, and acellular pertussis (Tdap, Td) vaccine. You may need a Td booster every 10 years.  Varicella vaccine. You may need this if you have not been vaccinated.  Zoster vaccine. You may need this after age 1.  Measles, mumps, and rubella (MMR) vaccine. You may need at least one dose of MMR if you were born in 1957 or later. You may also need a second dose.  Pneumococcal 13-valent conjugate (PCV13) vaccine. One dose is recommended after age 28.  Pneumococcal polysaccharide (PPSV23) vaccine. One dose is recommended after age 62.  Meningococcal vaccine. You may need this if you have certain conditions.  Hepatitis A vaccine. You may need this  if you have certain conditions or if you travel or work in places where you may be exposed to hepatitis A.  Hepatitis B vaccine. You may need this if you have certain conditions or if you travel or work in places where you may be exposed to hepatitis B.  Haemophilus influenzae type b (Hib) vaccine. You may need this if you have certain  conditions.  Talk to your health care provider about which screenings and vaccines you need and how often you need them. This information is not intended to replace advice given to you by your health care provider. Make sure you discuss any questions you have with your health care provider. Document Released: 08/12/2015 Document Revised: 04/04/2016 Document Reviewed: 05/17/2015 Elsevier Interactive Patient Education  [employer].

## 2018-06-20 ENCOUNTER — Telehealth: Payer: Self-pay | Admitting: *Deleted

## 2018-06-20 DIAGNOSIS — Z0000 Encounter for general adult medical examination without abnormal findings: Secondary | ICD-10-CM | POA: Diagnosis not present

## 2018-06-20 LAB — CBC WITH DIFFERENTIAL/PLATELET
BASOS PCT: 0 % (ref 0.0–3.0)
Basophils Absolute: 0 10*3/uL (ref 0.0–0.1)
EOS ABS: 0.2 10*3/uL (ref 0.0–0.7)
EOS PCT: 0.2 % (ref 0.0–5.0)
HEMATOCRIT: 39.9 % (ref 36.0–46.0)
Hemoglobin: 13 g/dL (ref 12.0–15.0)
Lymphocytes Relative: 94.7 % — ABNORMAL HIGH (ref 12.0–46.0)
Lymphs Abs: 83.8 10*3/uL — ABNORMAL HIGH (ref 0.7–4.0)
MCHC: 32.5 g/dL (ref 30.0–36.0)
MCV: 104.1 fl — ABNORMAL HIGH (ref 78.0–100.0)
MONO ABS: 1 10*3/uL (ref 0.1–1.0)
Monocytes Relative: 1.1 % — ABNORMAL LOW (ref 3.0–12.0)
NEUTROS ABS: 3.5 10*3/uL (ref 1.4–7.7)
Neutrophils Relative %: 4 % — ABNORMAL LOW (ref 43.0–77.0)
PLATELETS: 122 10*3/uL — AB (ref 150.0–400.0)
RBC: 3.83 Mil/uL — ABNORMAL LOW (ref 3.87–5.11)
RDW: 13.6 % (ref 11.5–15.5)
WBC: 88.5 10*3/uL (ref 4.0–10.5)

## 2018-06-20 LAB — COMPREHENSIVE METABOLIC PANEL
ALT: 20 U/L (ref 0–35)
AST: 24 U/L (ref 0–37)
Albumin: 4.4 g/dL (ref 3.5–5.2)
Alkaline Phosphatase: 67 U/L (ref 39–117)
BUN: 19 mg/dL (ref 6–23)
CALCIUM: 9.1 mg/dL (ref 8.4–10.5)
CHLORIDE: 107 meq/L (ref 96–112)
CO2: 27 meq/L (ref 19–32)
Creatinine, Ser: 0.79 mg/dL (ref 0.40–1.20)
GFR: 74.25 mL/min (ref >60.00)
Glucose, Bld: 98 mg/dL (ref 70–99)
POTASSIUM: 3.9 meq/L (ref 3.5–5.1)
SODIUM: 141 meq/L (ref 135–145)
Total Bilirubin: 0.8 mg/dL (ref 0.2–1.2)
Total Protein: 6.5 g/dL (ref 6.0–8.3)

## 2018-06-20 LAB — LIPID PANEL
CHOLESTEROL: 192 mg/dL (ref 0–200)
HDL: 51.1 mg/dL (ref >39.00)
LDL CALC: 127 mg/dL — AB (ref 0–99)
NonHDL: 140.86
TRIGLYCERIDES: 70 mg/dL (ref 0.0–149.0)
Total CHOL/HDL Ratio: 4
VLDL: 14 mg/dL (ref 0.0–40.0)

## 2018-06-20 LAB — VITAMIN B12: Vitamin B-12: 277 pg/mL (ref 211–911)

## 2018-06-20 LAB — VITAMIN D 25 HYDROXY (VIT D DEFICIENCY, FRACTURES): VITD: 47.05 ng/mL (ref 30.00–100.00)

## 2018-06-20 LAB — TSH: TSH: 1.17 u[IU]/mL (ref 0.35–4.50)

## 2018-06-20 NOTE — Addendum Note (Signed)
Addended by: Gwynne Edinger on: 06/20/2018 08:53 AM   Modules accepted: Orders

## 2018-06-20 NOTE — Telephone Encounter (Signed)
She has a history of CLL.  WBC count is stable.

## 2018-06-20 NOTE — Telephone Encounter (Signed)
CRITICAL VALUE STICKER  CRITICAL VALUE: WBC 88.5  RECEIVER (on-site recipient of call): Apolonio Schneiders  DATE & TIME NOTIFIED: 06/20/18 10:32  MESSENGER (representative from lab): Santiago Glad  MD NOTIFIED: Jerilee Hoh  TIME OF NOTIFICATION:  RESPONSE:

## 2018-07-17 ENCOUNTER — Ambulatory Visit: Payer: Self-pay

## 2018-07-17 ENCOUNTER — Ambulatory Visit (INDEPENDENT_AMBULATORY_CARE_PROVIDER_SITE_OTHER): Payer: Medicare Other

## 2018-07-17 ENCOUNTER — Ambulatory Visit: Payer: Medicare Other | Admitting: Internal Medicine

## 2018-07-17 VITALS — BP 110/68 | HR 80 | Temp 97.6°F | Ht 60.0 in | Wt 102.8 lb

## 2018-07-17 DIAGNOSIS — C50919 Malignant neoplasm of unspecified site of unspecified female breast: Secondary | ICD-10-CM

## 2018-07-17 DIAGNOSIS — R0789 Other chest pain: Secondary | ICD-10-CM

## 2018-07-17 DIAGNOSIS — Z853 Personal history of malignant neoplasm of breast: Secondary | ICD-10-CM

## 2018-07-17 NOTE — Telephone Encounter (Signed)
Pt. called to report sudden onset of Left upper quadrant abdominal pain that goes through to the back, last night at 11:00 PM.  Stated the pain is moderate and constant.  Denied any chest pain.  Denied shortness of breath; denied pain with deep inspiration.  Denied nausea or vomiting.  Stated it feels like the pain is behind the lower 2 ribs on left side.  Reported that laying down makes it worse. Denied fever/ chills.    Called FC; pt. Added on to Dr. Jerilee Hoh' schedule at 10:30 AM.  Recommended to stay NPO this morning, prior to her appt.  Pt. Verb. Understanding.     Reason for Disposition . [1] MILD-MODERATE pain AND [2] constant AND [3] present > 2 hours  Answer Assessment - Initial Assessment Questions 1. LOCATION: "Where does it hurt?"      Left side of abdomen at bottom of rib cage, and goes through to back  2. RADIATION: "Does the pain shoot anywhere else?" (e.g., chest, back)     denied 3. ONSET: "When did the pain begin?" (e.g., minutes, hours or days ago)      Last night about 11:00 PM  4. SUDDEN: "Gradual or sudden onset?"     Sudden onset  5. PATTERN "Does the pain come and go, or is it constant?"    - If constant: "Is it getting better, staying the same, or worsening?"      (Note: Constant means the pain never goes away completely; most serious pain is constant and it progresses)     - If intermittent: "How long does it last?" "Do you have pain now?"     (Note: Intermittent means the pain goes away completely between bouts)     Constant  6. SEVERITY: "How bad is the pain?"  (e.g., Scale 1-10; mild, moderate, or severe)    - MILD (1-3): doesn't interfere with normal activities, abdomen soft and not tender to touch     - MODERATE (4-7): interferes with normal activities or awakens from sleep, tender to touch     - SEVERE (8-10): excruciating pain, doubled over, unable to do any normal activities       Moderate 7. RECURRENT SYMPTOM: "Have you ever had this type of abdominal  pain before?" If so, ask: "When was the last time?" and "What happened that time?"      Yes ; it went away on its own; this time it is going through to her back 8. AGGRAVATING FACTORS: "Does anything seem to cause this pain?" (e.g., foods, stress, alcohol)    Laying down makes it worse  9. CARDIAC SYMPTOMS: "Do you have any of the following symptoms: chest pain, difficulty breathing, sweating, nausea?"    Denied chest pain, sweating, SOB, nausea or vomiting 10. OTHER SYMPTOMS: "Do you have any other symptoms?" (e.g., fever, vomiting, diarrhea)       Denied increased pain in taking a deep breath, denied fever/ chills  11. PREGNANCY: "Is there any chance you are pregnant?" "When was your last menstrual period?"      N/a  Protocols used: ABDOMINAL PAIN - UPPER-A-AH

## 2018-07-17 NOTE — Patient Instructions (Signed)
-  It was nice seeing you today.  -Xray of your chest today, to pay special attention to your ribcage. I will notify you once results are available.  -Follow up as scheduled.

## 2018-07-17 NOTE — Telephone Encounter (Signed)
Noted. Appt scheduled.  Nothing further needed.

## 2018-07-17 NOTE — Progress Notes (Signed)
Established Patient Office Visit     CC/Reason for Visit: "Abdominal pain"  HPI: April [name] is a 83 y.o. female who is coming in today for the above mentioned reasons.  She was seen in November for her annual physical exam.  She comes in today with what she describes as abdominal pain.  She states this has been present for the past couple months but she has been ignoring it.  When she points to the area that her pain is in, it is actually her left lower anterior rib cage.  It does not hurt to take a deep breath, it is especially painful at nighttime when she lies down on that side, she has not noticed any association to food or exercise.  She does not recall any injury or significant coughing spell.  She does have a history of osteoporosis on Prolia, she also has a remote history of breast cancer in the 6s and is status post double mastectomy.  She also has a history of CLL that has been followed by hematology and has been stable.  Past Medical/Surgical History: Past Medical History:  Diagnosis Date  . Breast CA ([place])   . Bronchitis, chronic ([place])   . Eye hemorrhage, left   . Leukemia, chronic lymphoid ([place])   . 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 Osteoporosis 05/2018   T score -3.1 overall stable from prior study    Past Surgical History:  Procedure Laterality Date  . ABDOMINAL HYSTERECTOMY  1980  . BREAST IMPLANTS REMOVED  2001  . BREAST SURGERY     Bilateral mastectomy  . MASTECTOMY  1982   BILATERAL  . OOPHORECTOMY     BSO  . PARTIAL COLECTOMY     INTESTINAL ABSCESS WITH SALPINGECTOMY  . RECONSTRUCTION LEFT ELBOW    . TONSILLECTOMY    . UMBILLICAL HERNIA REPAIR      Social History:  reports that she quit smoking about 63 years ago. Her smoking use included cigarettes. She has never used smokeless tobacco. She reports current alcohol use of about 5.0 standard drinks of alcohol per week. She reports that she does not use  drugs.  Allergies: Allergies  Allergen Reactions  . Codeine Nausea Only  . Levofloxacin Rash    Family History:  Family History  Problem Relation Age of Onset  . Cancer Brother        HODGKINS  . Cancer Brother        leukemia  . Parkinson's disease Brother   . Heart disease Mother   . Heart disease Sister      Current Outpatient Medications:  .  amLODipine (NORVASC) 2.5 MG tablet, TAKE 1 TABLET (2.5 MG TOTAL) BY MOUTH DAILY., Disp: 90 tablet, Rfl: 3 .  aspirin 81 MG tablet, Take 81 mg by mouth daily., Disp: , Rfl:  .  augmented betamethasone dipropionate (DIPROLENE-AF) 9.79 % cream, 1 APPLICATION APPLY ON THE SKIN TWICE A DAY APPLY TO AFFECTED AREAS AS NEEDED FLARES, Disp: , Rfl: 2 .  BESIVANCE 0.6 % SUSP, 4 drops daily for 2 days., Disp: , Rfl: 12 .  Bevacizumab (AVASTIN) 100 MG/4ML SOLN, Inject into the vein., Disp: , Rfl:  .  calcium-vitamin D (OSCAL WITH D) 500-200 MG-UNIT tablet, Take 1 tablet by mouth 2 (two) times daily., Disp: , Rfl:  .  denosumab (PROLIA) 60 MG/ML SOLN injection, Inject 60 mg into the skin every 6 (six) months. Administer in upper arm, thigh,  or abdomen, Disp: , Rfl:  .  ibuprofen (ADVIL) 200 MG tablet, Take 200 mg by mouth every 6 (six) hours as needed.  , Disp: , Rfl:  .  nystatin cream (MYCOSTATIN), Apply 1 application topically 2 (two) times daily., Disp: 30 g, Rfl: 3 .  terconazole (TERAZOL 7) 0.4 % vaginal cream, INSERT ONE APPLICATOR VAGINALLY EVERY 2 WEEKS, Disp: 45 g, Rfl: 3 .  triamcinolone ointment (KENALOG) 0.5 %, Apply 1 application topically 2 (two) times daily., Disp: 30 g, Rfl: 0  Review of Systems:  Constitutional: Denies fever, chills, diaphoresis, appetite change and fatigue.  HEENT: Denies photophobia, eye pain, redness, hearing loss, ear pain, congestion, sore throat, rhinorrhea, sneezing, mouth sores, trouble swallowing, neck pain, neck stiffness and tinnitus.   Respiratory: Denies SOB, DOE, cough, chest tightness,  and wheezing.    Cardiovascular: Denies chest pain, palpitations and leg swelling.  Gastrointestinal: Denies nausea, vomiting, abdominal pain, diarrhea, constipation, blood in stool and abdominal distention.  Genitourinary: Denies dysuria, urgency, frequency, hematuria, flank pain and difficulty urinating.  Endocrine: Denies: hot or cold intolerance, sweats, changes in hair or nails, polyuria, polydipsia. Musculoskeletal: Denies myalgias, back pain, joint swelling, arthralgias and gait problem.  Skin: Denies pallor, rash and wound.  Neurological: Denies dizziness, seizures, syncope, weakness, light-headedness, numbness and headaches.  Hematological: Denies adenopathy. Easy bruising, personal or family bleeding history  Psychiatric/Behavioral: Denies suicidal ideation, mood changes, confusion, nervousness, sleep disturbance and agitation    Physical Exam: Vitals:   07/17/18 1025  BP: 110/68  Pulse: 80  Temp: 97.6 F (36.4 C)  TempSrc: Oral  SpO2: 98%  Weight: 102 lb 12.8 oz (46.6 kg)  Height: 5' (1.524 m)    Body mass index is 20.08 kg/m.   Constitutional: NAD, calm, comfortable Eyes: PERRL, lids and conjunctivae normal ENMT: Mucous membranes are moist.  Respiratory: clear to auscultation bilaterally, no wheezing, no crackles. Normal respiratory effort. No accessory muscle use.  She is status post double mastectomy. Cardiovascular: Regular rate and rhythm, no murmurs / rubs / gallops. No extremity edema. 2+ pedal pulses. No carotid bruits.  Abdomen: no tenderness, no masses palpated. No hepatosplenomegaly. Bowel sounds positive.  Musculoskeletal: no clubbing / cyanosis. No joint deformity upper and lower extremities. Good ROM, no contractures. Normal muscle tone.  Neurologic: Grossly intact and nonfocal Psychiatric: Normal judgment and insight. Alert and oriented x 3. Normal mood.    Impression and Plan:  Left-sided chest wall pain  -This appears to be mainly rib cage pain. -My main  concern at this time would be an osteoporotic rib fracture, also need to consider possible bony metastases although this is very remote breast cancer with initial diagnosis and treatment in the 1980s, over 30 years ago. -She also has a history of CLL, so splenomegaly is also possibly in differential although the location points more towards the ribs. -She will go ahead and have a two-view chest x-ray today with special attention to rib detail. -We will notify her of results and further therapy.     Patient Instructions  -It was nice seeing you today.  -Xray of your chest today, to pay special attention to your ribcage. I will notify you once results are available.  -Follow up as scheduled.     Lelon Frohlich, MD  Primary Care at [facility]

## 2018-07-18 ENCOUNTER — Encounter (INDEPENDENT_AMBULATORY_CARE_PROVIDER_SITE_OTHER): Payer: Medicare Other | Admitting: Ophthalmology

## 2018-07-18 DIAGNOSIS — H43813 Vitreous degeneration, bilateral: Secondary | ICD-10-CM | POA: Diagnosis not present

## 2018-07-18 DIAGNOSIS — H348122 Central retinal vein occlusion, left eye, stable: Secondary | ICD-10-CM | POA: Diagnosis not present

## 2018-07-18 DIAGNOSIS — H35033 Hypertensive retinopathy, bilateral: Secondary | ICD-10-CM

## 2018-07-18 DIAGNOSIS — I10 Essential (primary) hypertension: Secondary | ICD-10-CM | POA: Diagnosis not present

## 2018-08-01 ENCOUNTER — Telehealth: Payer: Self-pay | Admitting: *Deleted

## 2018-08-07 NOTE — Telephone Encounter (Signed)
PROLIA GIVEN 01/27/18 NEXT INJECTION 07/31/2018

## 2018-08-07 NOTE — Telephone Encounter (Signed)
Deductible Amount Met     OOP MAX $4000 (44mt)  Annual exam 04/17/18  Calcium 9.1            Date 06/20/18  Upcoming dental procedures NO  Prior Authorization needed NO  Pt estimated Cost $90  APPT 08/12/2018 _0 :00      Coverage Details: $50 ONE DOSE $40 ADMIN FEE

## 2018-08-12 ENCOUNTER — Ambulatory Visit: Payer: Medicare Other | Admitting: Gynecology

## 2018-08-12 DIAGNOSIS — M810 Age-related osteoporosis without current pathological fracture: Secondary | ICD-10-CM | POA: Diagnosis not present

## 2018-08-12 MED ORDER — DENOSUMAB 60 MG/ML ~~LOC~~ SOSY
60.0000 mg | PREFILLED_SYRINGE | Freq: Once | SUBCUTANEOUS | Status: AC
  Administered 2018-08-12: 60 mg via SUBCUTANEOUS

## 2018-08-14 NOTE — Telephone Encounter (Signed)
PROLIA GIVEN 08/12/18 NEXT INJECTION 02/11/19

## 2018-09-08 ENCOUNTER — Other Ambulatory Visit: Payer: Self-pay | Admitting: Gynecology

## 2018-10-15 ENCOUNTER — Telehealth: Payer: Self-pay

## 2018-10-15 ENCOUNTER — Telehealth: Payer: Self-pay | Admitting: Hematology and Oncology

## 2018-10-15 ENCOUNTER — Other Ambulatory Visit: Payer: Self-pay | Admitting: Hematology and Oncology

## 2018-10-15 DIAGNOSIS — C9110 Chronic lymphocytic leukemia of B-cell type not having achieved remission: Secondary | ICD-10-CM

## 2018-10-15 NOTE — Telephone Encounter (Signed)
-----   Message from Heath Lark, MD sent at 10/15/2018  8:34 AM EDT ----- Regarding: how is she If she has no new change, move her appt 1 month out If she has symptoms, ok to keep appt

## 2018-10-15 NOTE — Telephone Encounter (Signed)
Called and given below message. She is doing well and is fine with moving appt out.  Scheduling message sent.

## 2018-10-15 NOTE — Telephone Encounter (Signed)
Scheduled appt per 3/18 sch message - pt aware of appt date and time   

## 2018-10-28 ENCOUNTER — Ambulatory Visit: Payer: Self-pay | Admitting: Hematology and Oncology

## 2018-10-28 ENCOUNTER — Other Ambulatory Visit: Payer: Self-pay

## 2018-10-29 ENCOUNTER — Encounter: Payer: Self-pay | Admitting: Hematology and Oncology

## 2018-10-30 ENCOUNTER — Telehealth: Payer: Self-pay | Admitting: Hematology and Oncology

## 2018-10-30 NOTE — Telephone Encounter (Signed)
Scheduled per sch msg. Called and spoke with patient. Confirmed date and time of appt.  °

## 2018-11-04 ENCOUNTER — Telehealth: Payer: Self-pay | Admitting: Internal Medicine

## 2018-11-04 NOTE — Telephone Encounter (Signed)
Pt. Called and left vm on Rx Refill Line with request for refill of Amlodipine 2.5 mg tab.  Reported it was filled last by Dr. Raliegh Ip.  Patient is requesting to be called re: when this has been handled.  Will forward to PCP.

## 2018-11-05 MED ORDER — AMLODIPINE BESYLATE 2.5 MG PO TABS: 2.5000 mg | ORAL_TABLET | Freq: Every day | ORAL | 1 refills | Status: DC

## 2018-11-05 NOTE — Telephone Encounter (Signed)
Refill sent.

## 2018-11-05 NOTE — Addendum Note (Signed)
Addended by: Westley Hummer B on: 11/05/2018 11:02 AM   Modules accepted: Orders

## 2018-11-21 ENCOUNTER — Encounter (INDEPENDENT_AMBULATORY_CARE_PROVIDER_SITE_OTHER): Payer: Medicare Other | Admitting: Ophthalmology

## 2018-11-27 ENCOUNTER — Other Ambulatory Visit: Payer: Self-pay

## 2018-11-27 ENCOUNTER — Ambulatory Visit: Payer: Self-pay | Admitting: Hematology and Oncology

## 2018-11-28 ENCOUNTER — Other Ambulatory Visit: Payer: Self-pay | Admitting: Gynecology

## 2018-12-18 ENCOUNTER — Ambulatory Visit (INDEPENDENT_AMBULATORY_CARE_PROVIDER_SITE_OTHER): Payer: Medicare Other | Admitting: Internal Medicine

## 2018-12-18 ENCOUNTER — Other Ambulatory Visit: Payer: Self-pay

## 2018-12-18 DIAGNOSIS — M810 Age-related osteoporosis without current pathological fracture: Secondary | ICD-10-CM | POA: Diagnosis not present

## 2018-12-18 DIAGNOSIS — C9110 Chronic lymphocytic leukemia of B-cell type not having achieved remission: Secondary | ICD-10-CM

## 2018-12-18 DIAGNOSIS — E785 Hyperlipidemia, unspecified: Secondary | ICD-10-CM

## 2018-12-18 DIAGNOSIS — I10 Essential (primary) hypertension: Secondary | ICD-10-CM

## 2018-12-18 NOTE — Progress Notes (Signed)
Virtual Visit via Telephone Note  I connected with April [name] on 12/18/18 at 11:30 AM EDT by telephone and verified that I am speaking with the correct person using two identifiers.   I discussed the limitations, risks, security and privacy concerns of performing an evaluation and management service by telephone and the availability of in person appointments. I also discussed with the patient that there may be a patient responsible charge related to this service. The patient expressed understanding and agreed to proceed.  We initially attempted to connect via video chat but were unable to due to technical difficulties on the patient's end, so we converted this visit to a phone visit.   Location patient: home Location provider: work office Participants present for the call: patient, provider Patient did not have a visit in the prior 7 days to address this/these issue(s).   History of Present Illness:  This is a 6 mo follow up of chronic conditions. She has no acute complaints today. She has been doing quite well since we last spoke. She had to cancel her oncology and ophtho appointments that were scheduled for March due to COVID-19 and wants to make sure that I am aware of that. Her PMH is significant for: Remote history of breast cancer in the 80s status post bilateral mastectomies, CLL with mild thrombocytopenia followed by Dr. Alvy Bimler, history of well-controlled hypertension, osteoporosis on Prolia every 6 months administered at her [place] office, central retinal vein occlusion of the left eye followed by ophthalmology. Her BP has been well controlled. Last 4 days: 127/68, 142/74, 144/78, 122/69. She is due for Prolia in June for her osteoporosis.    Observations/Objective: Patient sounds cheerful and well on the phone. I do not appreciate any increased work of breathing. Speech and thought processing are grossly intact. Patient reported vitals: BP 122/69 this am   Current  Outpatient Medications:  .  amLODipine (NORVASC) 2.5 MG tablet, Take 1 tablet (2.5 mg total) by mouth daily., Disp: 90 tablet, Rfl: 1 .  aspirin 81 MG tablet, Take 81 mg by mouth daily., Disp: , Rfl:  .  augmented betamethasone dipropionate (DIPROLENE-AF) 6.27 % cream, 1 APPLICATION APPLY ON THE SKIN TWICE A DAY APPLY TO AFFECTED AREAS AS NEEDED FLARES, Disp: , Rfl: 2 .  BESIVANCE 0.6 % SUSP, 4 drops daily for 2 days., Disp: , Rfl: 12 .  Bevacizumab (AVASTIN) 100 MG/4ML SOLN, Inject into the vein., Disp: , Rfl:  .  calcium-vitamin D (OSCAL WITH D) 500-200 MG-UNIT tablet, Take 1 tablet by mouth 2 (two) times daily., Disp: , Rfl:  .  denosumab (PROLIA) 60 MG/ML SOLN injection, Inject 60 mg into the skin every 6 (six) months. Administer in upper arm, thigh, or abdomen, Disp: , Rfl:  .  ibuprofen (ADVIL) 200 MG tablet, Take 200 mg by mouth every 6 (six) hours as needed.  , Disp: , Rfl:  .  nystatin cream (MYCOSTATIN), Apply 1 application topically 2 (two) times daily., Disp: 30 g, Rfl: 3 .  terconazole (TERAZOL 7) 0.4 % vaginal cream, INSERT ONE APPLICATOR VAGINALLY EVERY 2 WEEKS, Disp: 45 g, Rfl: 1 .  triamcinolone ointment (KENALOG) 0.5 %, Apply 1 application topically 2 (two) times daily., Disp: 30 g, Rfl: 0  Review of Systems:  Constitutional: Denies fever, chills, diaphoresis, appetite change and fatigue.  HEENT: Denies photophobia, eye pain, redness, hearing loss, ear pain, congestion, sore throat, rhinorrhea, sneezing, mouth sores, trouble swallowing, neck pain, neck stiffness and tinnitus.  Respiratory: Denies SOB, DOE, cough, chest tightness,  and wheezing.   Cardiovascular: Denies chest pain, palpitations and leg swelling.  Gastrointestinal: Denies nausea, vomiting, abdominal pain, diarrhea, constipation, blood in stool and abdominal distention.  Genitourinary: Denies dysuria, urgency, frequency, hematuria, flank pain and difficulty urinating.  Endocrine: Denies: hot or cold intolerance,  sweats, changes in hair or nails, polyuria, polydipsia. Musculoskeletal: Denies myalgias, back pain, joint swelling, arthralgias and gait problem.  Skin: Denies pallor, rash and wound.  Neurological: Denies dizziness, seizures, syncope, weakness, light-headedness, numbness and headaches.  Hematological: Denies adenopathy. Easy bruising, personal or family bleeding history  Psychiatric/Behavioral: Denies suicidal ideation, mood changes, confusion, nervousness, sleep disturbance and agitation   Assessment and Plan:  Chronic lymphocytic leukemia ([place]) -F/u with onc as scheduled.  Dyslipidemia -Not on meds. -LDL 127 in 11/19.  Osteoporosis without current pathological fracture, unspecified osteoporosis type -Prolia q 6 mo. -Last DEXA 11/19  Essential hypertension -Well controlled on norvasc 2.5 mg.    I discussed the assessment and treatment plan with the patient. The patient was provided an opportunity to ask questions and all were answered. The patient agreed with the plan and demonstrated an understanding of the instructions.   The patient was advised to call back or seek an in-person evaluation if the symptoms worsen or if the condition fails to improve as anticipated.  I provided 22 minutes of non-face-to-face time during this encounter.   Lelon Frohlich, MD [place] [facility] at [facility]

## 2018-12-31 ENCOUNTER — Emergency Department (HOSPITAL_COMMUNITY)
Admission: EM | Admit: 2018-12-31 | Discharge: 2018-12-31 | Disposition: A | Discharge status: Home / Self Care | Payer: Medicare Other | Attending: Emergency Medicine | Admitting: Emergency Medicine

## 2018-12-31 ENCOUNTER — Emergency Department (HOSPITAL_COMMUNITY): Payer: Medicare Other

## 2018-12-31 ENCOUNTER — Other Ambulatory Visit: Payer: Self-pay

## 2018-12-31 ENCOUNTER — Ambulatory Visit: Payer: Self-pay | Admitting: Internal Medicine

## 2018-12-31 ENCOUNTER — Encounter (HOSPITAL_COMMUNITY): Payer: Self-pay | Admitting: *Deleted

## 2018-12-31 DIAGNOSIS — Z856 Personal history of leukemia: Secondary | ICD-10-CM | POA: Diagnosis not present

## 2018-12-31 DIAGNOSIS — Z853 Personal history of malignant neoplasm of breast: Secondary | ICD-10-CM | POA: Diagnosis not present

## 2018-12-31 DIAGNOSIS — I5020 Unspecified systolic (congestive) heart failure: Secondary | ICD-10-CM | POA: Insufficient documentation

## 2018-12-31 DIAGNOSIS — Z7982 Long term (current) use of aspirin: Secondary | ICD-10-CM | POA: Diagnosis not present

## 2018-12-31 DIAGNOSIS — Z85828 Personal history of other malignant neoplasm of skin: Secondary | ICD-10-CM | POA: Diagnosis not present

## 2018-12-31 DIAGNOSIS — I110 Hypertensive heart disease with heart failure: Secondary | ICD-10-CM | POA: Diagnosis not present

## 2018-12-31 DIAGNOSIS — Z79899 Other long term (current) drug therapy: Secondary | ICD-10-CM | POA: Insufficient documentation

## 2018-12-31 DIAGNOSIS — R0789 Other chest pain: Secondary | ICD-10-CM | POA: Insufficient documentation

## 2018-12-31 DIAGNOSIS — Z87891 Personal history of nicotine dependence: Secondary | ICD-10-CM | POA: Diagnosis not present

## 2018-12-31 LAB — POTASSIUM: Potassium: 5.5 mmol/L — ABNORMAL HIGH (ref 3.5–5.1)

## 2018-12-31 LAB — CBC
HCT: 40.5 % (ref 36.0–46.0)
Hemoglobin: 12.6 g/dL (ref 12.0–15.0)
MCH: 32.9 pg (ref 26.0–34.0)
MCHC: 31.1 g/dL (ref 30.0–36.0)
MCV: 105.7 fL — ABNORMAL HIGH (ref 80.0–100.0)
Platelets: 124 10*3/uL — ABNORMAL LOW (ref 150–400)
RBC: 3.83 MIL/uL — ABNORMAL LOW (ref 3.87–5.11)
RDW: 13.3 % (ref 11.5–15.5)
WBC: 104.9 10*3/uL (ref 4.0–10.5)
nRBC: 0 % (ref 0.0–0.2)

## 2018-12-31 LAB — D-DIMER, QUANTITATIVE: D-Dimer, Quant: 0.5 ug/mL-FEU (ref 0.00–0.50)

## 2018-12-31 LAB — BASIC METABOLIC PANEL
Anion gap: 10 (ref 5–15)
BUN: 13 mg/dL (ref 8–23)
CO2: 23 mmol/L (ref 22–32)
Calcium: 9.3 mg/dL (ref 8.9–10.3)
Chloride: 105 mmol/L (ref 98–111)
Creatinine, Ser: 0.87 mg/dL (ref 0.44–1.00)
GFR calc Af Amer: >60 mL/min (ref >60)
GFR calc non Af Amer: >60 mL/min (ref >60)
Glucose, Bld: 104 mg/dL — ABNORMAL HIGH (ref 70–99)
Potassium: 6.2 mmol/L — ABNORMAL HIGH (ref 3.5–5.1)
Sodium: 138 mmol/L (ref 135–145)

## 2018-12-31 LAB — TROPONIN I
Troponin I: <0.03 ng/mL (ref <0.03)
Troponin I: <0.03 ng/mL (ref <0.03)

## 2018-12-31 MED ORDER — SODIUM CHLORIDE 0.9% FLUSH: 3.0000 mL | Freq: Once | INTRAVENOUS | Status: DC

## 2018-12-31 NOTE — Telephone Encounter (Signed)
Pt seen at [facility] ED and advised to follow up with PCP.   FYI

## 2018-12-31 NOTE — ED Triage Notes (Signed)
To ED for eval of CP with deep breath since last pm. States she was unable to sleep well last pm due to having to breath shallowly. Denies fever, cough, or sneezing. States she spoke with her pmd's nurse who instructed her to come to ED. No pain currently.

## 2018-12-31 NOTE — ED Notes (Signed)
Called lab to add on D-dimer

## 2018-12-31 NOTE — Discharge Instructions (Signed)
Take tylenol 1000mg (2 extra strength) four times a day.   Try to rest your chest wall, no lifting greater than 10 lbs, twisting, bending.

## 2018-12-31 NOTE — Telephone Encounter (Signed)
Pt called in c/o having  A pain in the middle of her left chest when she takes a deep breath that started yesterday morning.  I spoke with Dr. Ledell Noss office and they want her to go to the ED.  I let the pt now this. She is going to [facility].   Her husband is going to drive her there .  She is aware he can't go in with her due to the COVID-19 pandemic.    I sent my triage notes to Dr. Ledell Noss office.    Reason for Disposition . [1] MILD difficulty breathing (e.g., minimal/no SOB at rest, SOB with walking, pulse <100) AND [2] NEW-onset or WORSE than normal  Answer Assessment - Initial Assessment Questions 1. RESPIRATORY STATUS: "Describe your breathing?" (e.g., wheezing, shortness of breath, unable to speak, severe coughing)      It started yesterday.  I thought it was indigestion.   I tried an Pharmacist, hospital which did not help. It hurts when I take a deep breath in the middle of my left chest.   Sometimes uncomfortable in my upper back. 2. ONSET: "When did this breathing problem begin?"      Yesterday 3. PATTERN "Does the difficult breathing come and go, or has it been constant since it started?"      It hurts every time I take a deep breath. 4. SEVERITY: "How bad is your breathing?" (e.g., mild, moderate, severe)    - MILD: No SOB at rest, mild SOB with walking, speaks normally in sentences, can lay down, no retractions, pulse < 100.    - MODERATE: SOB at rest, SOB with minimal exertion and prefers to sit, cannot lie down flat, speaks in phrases, mild retractions, audible wheezing, pulse 100-120.    - SEVERE: Very SOB at rest, speaks in single words, struggling to breathe, sitting hunched forward, retractions, pulse > 120      No shortness of breath. 5. RECURRENT SYMPTOM: "Have you had difficulty breathing before?" If so, ask: "When was the last time?" and "What happened that time?"      No 6. CARDIAC HISTORY: "Do you have any history of heart disease?" (e.g., heart attack,  angina, bypass surgery, angioplasty)      No cardiac problems 7. LUNG HISTORY: "Do you have any history of lung disease?"  (e.g., pulmonary embolus, asthma, emphysema)     Maybe 10 yrs ago I had asthma type problems.   No problems since. 8. CAUSE: "What do you think is causing the breathing problem?"      I don't know.    It has not eased up. 9. OTHER SYMPTOMS: "Do you have any other symptoms? (e.g., dizziness, runny nose, cough, chest pain, fever)     I didn't sleep much.    I'm sitting down now and it's not bad now.  No other symptoms.   I don't feel like I have a fever.   10. PREGNANCY: "Is there any chance you are pregnant?" "When was your last menstrual period?"       Not asked due to age 84. TRAVEL: "Have you traveled out of the country in the last month?" (e.g., travel history, exposures)       My husband passed out Sunday morning.  EMS took him to [facility] hospital.  He was discharged Monday.   His COVID-19 test was negative.   I've not been exposed to anyone.  Protocols used: BREATHING DIFFICULTY-A-AH

## 2018-12-31 NOTE — ED Notes (Signed)
Patient transported to X-ray 

## 2018-12-31 NOTE — Telephone Encounter (Signed)
Virtual visit okay per Dr Jerilee Hoh

## 2018-12-31 NOTE — ED Notes (Signed)
This RN acting as Art therapist and asked if pt would like for me to contact any family. Pt declined and states she is able to keep them informed.

## 2018-12-31 NOTE — Telephone Encounter (Signed)
Will monitor for ED arrival.  

## 2018-12-31 NOTE — ED Provider Notes (Signed)
[name] DEPARTMENT Provider Note   CSN: [id number] Arrival date & time: 12/31/18  2263    History   Chief Complaint Chief Complaint  Patient presents with  . Chest Pain    HPI April [name] is a 84 y.o. female.     84 yo F with a cc of left chest wall pain.  Worse with movement, palpation, twisting.  Sharp.  Denies trauma.  Hx of HTN.  Denies hld, dm, smoking.  Mom and sister had heart attacks later in life.    Denies hx of PE or DVT.  No recent surgery or immobilization, hospitalization.   The history is provided by the patient.  Chest Pain  Pain location:  L lateral chest Pain quality: sharp and shooting   Pain radiates to:  Does not radiate Pain severity:  Moderate Onset quality:  Gradual Duration:  2 days Timing:  Constant Progression:  Worsening Chronicity:  New Context: breathing and movement   Relieved by:  Nothing Worsened by:  Nothing Ineffective treatments:  None tried Associated symptoms: no dizziness, no fever, no headache, no nausea, no palpitations, no shortness of breath and no vomiting     Past Medical History:  Diagnosis Date  . Breast CA ([place])   . Bronchitis, chronic ([place])   . Eye hemorrhage, left   . Leukemia, chronic lymphoid ([place])   . 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 Osteoporosis 05/2018   T score -3.1 overall stable from prior study    Patient Active Problem List   Diagnosis Date Noted  . Breast cancer ([place]) 06/19/2018  . Essential hypertension 12/14/2016  . Systolic hypertension, isolated 11/16/2016  . Central retinal vein occlusion 10/18/2016  . Preventive measure 04/28/2015  . Thrombocytopenia ([place]) 04/28/2015  . Idiopathic scoliosis 10/23/2012  . Chronic lymphocytic leukemia ([place]) 03/02/2009  . VENOUS INSUFFICIENCY, CHRONIC 10/28/2008  . Dyslipidemia 10/27/2007  . Osteoporosis 10/27/2007  . SKIN CANCER, HX OF 10/27/2007  . DIVERTICULITIS, HX OF 04/08/2007     Past Surgical History:  Procedure Laterality Date  . ABDOMINAL HYSTERECTOMY  1980  . BREAST IMPLANTS REMOVED  2001  . BREAST SURGERY     Bilateral mastectomy  . MASTECTOMY  1982   BILATERAL  . OOPHORECTOMY     BSO  . PARTIAL COLECTOMY     INTESTINAL ABSCESS WITH SALPINGECTOMY  . RECONSTRUCTION LEFT ELBOW    . TONSILLECTOMY    . UMBILLICAL HERNIA REPAIR       OB History    Gravida  0   Para  0   Term      Preterm      AB      Living        SAB      TAB      Ectopic      Multiple      Live Births               Home Medications    Prior to Admission medications   Medication Sig Start Date End Date Taking? Authorizing Provider  amLODipine (NORVASC) 2.5 MG tablet Take 1 tablet (2.5 mg total) by mouth daily. 11/05/18   Isaac Bliss, Rayford Halsted, MD  aspirin 81 MG tablet Take 81 mg by mouth daily.    [provider]  augmented betamethasone dipropionate (DIPROLENE-AF) 3.35 % cream 1 APPLICATION APPLY ON THE SKIN TWICE A DAY APPLY TO AFFECTED AREAS AS NEEDED FLARES 05/08/18  [provider]  BESIVANCE 0.6 % SUSP 4 drops daily for 2 days. 11/07/16   [provider]  Bevacizumab (AVASTIN) 100 MG/4ML SOLN Inject into the vein.    [provider]  calcium-vitamin D (OSCAL WITH D) 500-200 MG-UNIT tablet Take 1 tablet by mouth 2 (two) times daily.    [provider]  denosumab (PROLIA) 60 MG/ML SOLN injection Inject 60 mg into the skin every 6 (six) months. Administer in upper arm, thigh, or abdomen    [provider]  ibuprofen (ADVIL) 200 MG tablet Take 200 mg by mouth every 6 (six) hours as needed.      [provider]  nystatin cream (MYCOSTATIN) Apply 1 application topically 2 (two) times daily. 04/09/17   Fontaine, Belinda Block, MD  terconazole (TERAZOL 7) 0.4 % vaginal cream INSERT ONE APPLICATOR VAGINALLY EVERY 2 WEEKS 11/28/18   Fontaine, Belinda Block, MD  triamcinolone ointment (KENALOG) 0.5 % Apply 1  application topically 2 (two) times daily. 05/06/17   Heath Lark, MD    Family History Family History  Problem Relation Age of Onset  . Cancer Brother        HODGKINS  . Cancer Brother        leukemia  . Parkinson's disease Brother   . Heart disease Mother   . Heart disease Sister     Social History Social History   Tobacco Use  . Smoking status: Former Smoker    Types: Cigarettes    Last attempt to quit: 03/26/1955    Years since quitting: 63.8  . Smokeless tobacco: Never Used  Substance Use Topics  . Alcohol use: Yes    Alcohol/week: 5.0 standard drinks    Types: 5 Standard drinks or equivalent per week    Comment: wine  . Drug use: No     Allergies   Codeine and Levofloxacin   Review of Systems Review of Systems  Constitutional: Negative for chills and fever.  HENT: Negative for congestion and rhinorrhea.   Eyes: Negative for redness and visual disturbance.  Respiratory: Negative for shortness of breath and wheezing.   Cardiovascular: Positive for chest pain. Negative for palpitations.  Gastrointestinal: Negative for nausea and vomiting.  Genitourinary: Negative for dysuria and urgency.  Musculoskeletal: Negative for arthralgias and myalgias.  Skin: Negative for pallor and wound.  Neurological: Negative for dizziness and headaches.     Physical Exam Updated Vital Signs BP 135/74   Pulse 79   Temp 97.8 F (36.6 C) (Oral)   Resp 16   Ht 5' (1.524 m)   Wt 45.8 kg   LMP 03/26/1979   SpO2 96%   BMI 19.73 kg/m   Physical Exam Vitals signs and nursing note reviewed.  Constitutional:      General: She is not in acute distress.    Appearance: She is well-developed. She is not diaphoretic.  HENT:     Head: Normocephalic and atraumatic.  Eyes:     Pupils: Pupils are equal, round, and reactive to light.  Neck:     Musculoskeletal: Normal range of motion and neck supple.  Cardiovascular:     Rate and Rhythm: Normal rate and regular rhythm.     Heart  sounds: No murmur. No friction rub. No gallop.   Pulmonary:     Effort: Pulmonary effort is normal.     Breath sounds: No wheezing or rales.  Chest:     Chest wall: Tenderness present.     Comments: Point tender to  the left lateral chest wall about rib 5.  Abdominal:     General: There is no distension.     Palpations: Abdomen is soft.     Tenderness: There is no abdominal tenderness.  Musculoskeletal:        General: No tenderness.  Skin:    General: Skin is warm and dry.  Neurological:     Mental Status: She is alert and oriented to person, place, and time.  Psychiatric:        Behavior: Behavior normal.      ED Treatments / Results  Labs (all labs ordered are listed, but only abnormal results are displayed) Labs Reviewed  BASIC METABOLIC PANEL - Abnormal; Notable for the following components:      Result Value   Potassium 6.2 (*)    Glucose, Bld 104 (*)    All other components within normal limits  CBC - Abnormal; Notable for the following components:   WBC 104.9 (*)    RBC 3.83 (*)    MCV 105.7 (*)    Platelets 124 (*)    All other components within normal limits  POTASSIUM - Abnormal; Notable for the following components:   Potassium 5.5 (*)    All other components within normal limits  TROPONIN I  D-DIMER, QUANTITATIVE (NOT AT [facility])  TROPONIN I    EKG EKG Interpretation  Date/Time:  Wednesday December 31 2018 09:40:55 EDT Ventricular Rate:  93 PR Interval:  146 QRS Duration: 64 QT Interval:  344 QTC Calculation: 427 R Axis:   51 Text Interpretation:  Normal sinus rhythm Possible Left atrial enlargement Borderline ECG t waves no longer peaked Otherwise no significant change Confirmed by Deno Etienne [phone]) on 12/31/2018 10:24:48 AM   Radiology Dg Chest 2 View  Result Date: 12/31/2018 CLINICAL DATA:  Chest pain, shortness of breath. EXAM: CHEST - 2 VIEW COMPARISON:  Radiographs of July 17, 2018. FINDINGS: The heart size and mediastinal contours are within  normal limits. Both lungs are clear. No pneumothorax or pleural effusion is noted. Stable scoliosis of thoracic spine is noted. IMPRESSION: No active cardiopulmonary disease. Electronically Signed   By: Marijo Conception M.D.   On: 12/31/2018 10:10    Procedures Procedures (including critical care time)  Medications Ordered in ED Medications  sodium chloride flush (NS) 0.9 % injection 3 mL (3 mLs Intravenous Not Given 12/31/18 1044)     Initial Impression / Assessment and Plan / ED Course  I have reviewed the triage vital signs and the nursing notes.  Pertinent labs & imaging results that were available during my care of the patient were reviewed by me and considered in my medical decision making (see chart for details).        84 yo F with a chief complaints of left-sided chest pain.  Most likely this is musculoskeletal based on history and physical.  She does have CLL, her white blood cell count is 104,000.  We will send off a d-dimer.  Chest x-ray viewed by me without focal infiltrate or pneumothorax.  I feel her symptoms are completely atypical of ACS, will obtain a delta troponin based on age and risk factors.  Delta negative, ddimer negative.  D/c home.  PCP follow up.   Will have patient call their oncologist about their uptrending leukocytosis.   2:03 PM:  I have discussed the diagnosis/risks/treatment options with the patient and believe the pt to be eligible for discharge home to follow-up with PCP, oncology. We also  discussed returning to the ED immediately if new or worsening sx occur. We discussed the sx which are most concerning (e.g., sudden worsening pain, fever, inability to tolerate by mouth) that necessitate immediate return. Medications administered to the patient during their visit and any new prescriptions provided to the patient are listed below.  Medications given during this visit Medications  sodium chloride flush (NS) 0.9 % injection 3 mL (3 mLs Intravenous Not  Given 12/31/18 1044)     The patient appears reasonably screen and/or stabilized for discharge and I doubt any other medical condition or other [facility] requiring further screening, evaluation, or treatment in the ED at this time prior to discharge.    Final Clinical Impressions(s) / ED Diagnoses   Final diagnoses:  Atypical chest pain    ED Discharge Orders    None       Deno Etienne, DO 12/31/18 1403

## 2019-01-01 NOTE — Telephone Encounter (Signed)
Patient does not have a smart phone or a camera on her computer.  Telephone visit was scheduled.

## 2019-01-02 ENCOUNTER — Other Ambulatory Visit: Payer: Self-pay

## 2019-01-02 ENCOUNTER — Ambulatory Visit (INDEPENDENT_AMBULATORY_CARE_PROVIDER_SITE_OTHER): Payer: Medicare Other | Admitting: Internal Medicine

## 2019-01-02 DIAGNOSIS — R0789 Other chest pain: Secondary | ICD-10-CM

## 2019-01-02 DIAGNOSIS — D72829 Elevated white blood cell count, unspecified: Secondary | ICD-10-CM | POA: Diagnosis not present

## 2019-01-02 DIAGNOSIS — C9110 Chronic lymphocytic leukemia of B-cell type not having achieved remission: Secondary | ICD-10-CM

## 2019-01-02 DIAGNOSIS — Z09 Encounter for follow-up examination after completed treatment for conditions other than malignant neoplasm: Secondary | ICD-10-CM

## 2019-01-02 NOTE — Progress Notes (Signed)
Virtual Visit via Telephone Note  I connected with April [name] on 01/02/19 at  1:00 PM EDT by telephone and verified that I am speaking with the correct person using two identifiers.   I discussed the limitations, risks, security and privacy concerns of performing an evaluation and management service by telephone and the availability of in person appointments. I also discussed with the patient that there may be a patient responsible charge related to this service. The patient expressed understanding and agreed to proceed.  We initially attempted to connect via video chat but were unable to due to technical difficulties on the patient's end, so we converted this visit to a phone visit.   Location patient: home Location provider: work office Participants present for the call: patient, provider Patient did not have a visit in the prior 7 days to address this/these issue(s).   History of Present Illness:  This visit was scheduled as an ER follow up. She was seen in the ED on 6/3 for left lateral chest wall pain and difficulty taking a deep breath. CXR was negative, D-dimer was negative, so a CT chest was not done. She was told it was likely MSK and asked to take tylenol, which she has. She feels almost back to normal. She does mention that shortly before this her husband had a syncopal event at home and she believes she might have exerted some force in bringing him upright before EMS got there.  In the ED her WBC count was noted to be 105, her most recent baselines have been around 85.   Observations/Objective: Patient sounds cheerful and well on the phone. I do not appreciate any increased work of breathing. Speech and thought processing are grossly intact. Patient reported vitals: none reported today   Current Outpatient Medications:  .  amLODipine (NORVASC) 2.5 MG tablet, Take 1 tablet (2.5 mg total) by mouth daily., Disp: 90 tablet, Rfl: 1 .  aspirin 81 MG tablet, Take 81 mg by  mouth daily., Disp: , Rfl:  .  augmented betamethasone dipropionate (DIPROLENE-AF) 4.09 % cream, 1 APPLICATION APPLY ON THE SKIN TWICE A DAY APPLY TO AFFECTED AREAS AS NEEDED FLARES, Disp: , Rfl: 2 .  BESIVANCE 0.6 % SUSP, 4 drops daily for 2 days., Disp: , Rfl: 12 .  Bevacizumab (AVASTIN) 100 MG/4ML SOLN, Inject into the vein., Disp: , Rfl:  .  calcium-vitamin D (OSCAL WITH D) 500-200 MG-UNIT tablet, Take 1 tablet by mouth 2 (two) times daily., Disp: , Rfl:  .  denosumab (PROLIA) 60 MG/ML SOLN injection, Inject 60 mg into the skin every 6 (six) months. Administer in upper arm, thigh, or abdomen, Disp: , Rfl:  .  ibuprofen (ADVIL) 200 MG tablet, Take 200 mg by mouth every 6 (six) hours as needed.  , Disp: , Rfl:  .  nystatin cream (MYCOSTATIN), Apply 1 application topically 2 (two) times daily., Disp: 30 g, Rfl: 3 .  terconazole (TERAZOL 7) 0.4 % vaginal cream, INSERT ONE APPLICATOR VAGINALLY EVERY 2 WEEKS, Disp: 45 g, Rfl: 1 .  triamcinolone ointment (KENALOG) 0.5 %, Apply 1 application topically 2 (two) times daily., Disp: 30 g, Rfl: 0  Review of Systems:  Constitutional: Denies fever, chills, diaphoresis, appetite change and fatigue.  HEENT: Denies photophobia, eye pain, redness, hearing loss, ear pain, congestion, sore throat, rhinorrhea, sneezing, mouth sores, trouble swallowing, neck pain, neck stiffness and tinnitus.   Respiratory: Denies SOB, DOE, cough, chest tightness,  and wheezing.   Cardiovascular: Denies  chest pain, palpitations and leg swelling.  Gastrointestinal: Denies nausea, vomiting, abdominal pain, diarrhea, constipation, blood in stool and abdominal distention.  Genitourinary: Denies dysuria, urgency, frequency, hematuria, flank pain and difficulty urinating.  Endocrine: Denies: hot or cold intolerance, sweats, changes in hair or nails, polyuria, polydipsia. Musculoskeletal: Denies myalgias, back pain, joint swelling, arthralgias and gait problem.  Skin: Denies pallor, rash  and wound.  Neurological: Denies dizziness, seizures, syncope, weakness, light-headedness, numbness and headaches.  Hematological: Denies adenopathy. Easy bruising, personal or family bleeding history  Psychiatric/Behavioral: Denies suicidal ideation, mood changes, confusion, nervousness, sleep disturbance and agitation   Assessment and Plan:  Hospital discharge follow-up Chest wall pain Leukocytosis, unspecified type Chronic lymphocytic leukemia ([place])  -Chest wall pain has almost completely resolved. Agree likely MSK in origin. -I am a little concerned about her 20 point increase in WBCs and have asked her to follow up with her heme/onc, Dr. Alvy Bimler. Not clear if this played a role in her chest wall pain (probably not).    I discussed the assessment and treatment plan with the patient. The patient was provided an opportunity to ask questions and all were answered. The patient agreed with the plan and demonstrated an understanding of the instructions.   The patient was advised to call back or seek an in-person evaluation if the symptoms worsen or if the condition fails to improve as anticipated.  I provided 18 minutes of non-face-to-face time during this encounter.   Lelon Frohlich, MD  Primary Care at [facility]

## 2019-01-05 ENCOUNTER — Telehealth: Payer: Self-pay

## 2019-01-05 NOTE — Telephone Encounter (Signed)
Is her CP gone? Is she feeling OK? Even though the WBC is slightly higher than before, her other labs are OK I am inclined to just see her next month as scheduled unless she has other symptoms such as weight loss, night sweats or new lumps

## 2019-01-05 NOTE — Telephone Encounter (Signed)
Called and given below message,. She verbalized understanding. Chest pain has gone away and she is feeling okay. Denies any other symptoms. She will keep appt as scheduled and call the office back if anything changes.

## 2019-01-05 NOTE — Telephone Encounter (Signed)
She called and left a message. She went to ER on 6/3 and her WBC was elevated. She was told to call you and let you know.

## 2019-01-22 ENCOUNTER — Encounter (INDEPENDENT_AMBULATORY_CARE_PROVIDER_SITE_OTHER): Payer: Medicare Other | Admitting: Ophthalmology

## 2019-02-02 ENCOUNTER — Other Ambulatory Visit: Payer: Self-pay

## 2019-02-02 ENCOUNTER — Inpatient Hospital Stay: Payer: Medicare Other

## 2019-02-02 ENCOUNTER — Inpatient Hospital Stay: Payer: Medicare Other | Attending: Hematology and Oncology | Admitting: Hematology and Oncology

## 2019-02-02 ENCOUNTER — Encounter: Payer: Self-pay | Admitting: Hematology and Oncology

## 2019-02-02 DIAGNOSIS — D696 Thrombocytopenia, unspecified: Secondary | ICD-10-CM | POA: Diagnosis not present

## 2019-02-02 DIAGNOSIS — C9110 Chronic lymphocytic leukemia of B-cell type not having achieved remission: Secondary | ICD-10-CM

## 2019-02-02 DIAGNOSIS — Z79899 Other long term (current) drug therapy: Secondary | ICD-10-CM | POA: Insufficient documentation

## 2019-02-02 DIAGNOSIS — L982 Febrile neutrophilic dermatosis [Sweet]: Secondary | ICD-10-CM | POA: Diagnosis not present

## 2019-02-02 LAB — CBC WITH DIFFERENTIAL/PLATELET
Abs Immature Granulocytes: 0.08 10*3/uL — ABNORMAL HIGH (ref 0.00–0.07)
Basophils Absolute: 0.1 10*3/uL (ref 0.0–0.1)
Basophils Relative: 0 %
Eosinophils Absolute: 0.1 10*3/uL (ref 0.0–0.5)
Eosinophils Relative: 0 %
HCT: 38.9 % (ref 36.0–46.0)
Hemoglobin: 12.3 g/dL (ref 12.0–15.0)
Immature Granulocytes: 0 %
Lymphocytes Relative: 94 %
Lymphs Abs: 87.4 10*3/uL — ABNORMAL HIGH (ref 0.7–4.0)
MCH: 33.2 pg (ref 26.0–34.0)
MCHC: 31.6 g/dL (ref 30.0–36.0)
MCV: 104.9 fL — ABNORMAL HIGH (ref 80.0–100.0)
Monocytes Absolute: 2.3 10*3/uL — ABNORMAL HIGH (ref 0.1–1.0)
Monocytes Relative: 3 %
Neutro Abs: 2.9 10*3/uL (ref 1.7–7.7)
Neutrophils Relative %: 3 %
Platelets: 108 10*3/uL — ABNORMAL LOW (ref 150–400)
RBC: 3.71 MIL/uL — ABNORMAL LOW (ref 3.87–5.11)
RDW: 13.3 % (ref 11.5–15.5)
WBC: 92.9 10*3/uL (ref 4.0–10.5)
nRBC: 0 % (ref 0.0–0.2)

## 2019-02-02 LAB — COMPREHENSIVE METABOLIC PANEL
ALT: 22 U/L (ref 0–44)
AST: 25 U/L (ref 15–41)
Albumin: 4.1 g/dL (ref 3.5–5.0)
Alkaline Phosphatase: 63 U/L (ref 38–126)
Anion gap: 8 (ref 5–15)
BUN: 16 mg/dL (ref 8–23)
CO2: 26 mmol/L (ref 22–32)
Calcium: 9.3 mg/dL (ref 8.9–10.3)
Chloride: 107 mmol/L (ref 98–111)
Creatinine, Ser: 0.83 mg/dL (ref 0.44–1.00)
GFR calc Af Amer: >60 mL/min (ref >60)
GFR calc non Af Amer: >60 mL/min (ref >60)
Glucose, Bld: 87 mg/dL (ref 70–99)
Potassium: 4.4 mmol/L (ref 3.5–5.1)
Sodium: 141 mmol/L (ref 135–145)
Total Bilirubin: 0.8 mg/dL (ref 0.3–1.2)
Total Protein: 6.7 g/dL (ref 6.5–8.1)

## 2019-02-02 NOTE — Assessment & Plan Note (Signed)
Her CBC is stable although she has mild worsening lymphocytosis gradually over the last 2 years She has no signs or symptoms that would warrant treatment at this point The patient is educated to watch out for signs and symptoms of disease progression Plan to see her back in 3 months with history, physical examination and blood work

## 2019-02-02 NOTE — Progress Notes (Signed)
[facility] OFFICE PROGRESS NOTE  Patient Care Team: Isaac Bliss, Rayford Halsted, MD as PCP - General (Internal Medicine)  ASSESSMENT & PLAN:  Chronic lymphocytic leukemia Her CBC is stable although she has mild worsening lymphocytosis gradually over the last 2 years She has no signs or symptoms that would warrant treatment at this point The patient is educated to watch out for signs and symptoms of disease progression Plan to see her back in 3 months with history, physical examination and blood work   Thrombocytopenia  Could be due to CLL. She is not symptomatic. Observe only.  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   No orders of the defined types were placed in this encounter.   INTERVAL HISTORY: Please see below for problem oriented charting. She returns for further follow-up I reviewed her electronic records related to recent presentation to the emergency department for chest pain/discomfort She continues to have intermittent discomfort but not so much in her chest but more over the left upper quadrant rib cage area that comes and goes She continues to have diffuse arthritis type pain Her appetite is stable Denies anorexia, abnormal weight loss or night sweats The patient denies any recent signs or symptoms of bleeding such as spontaneous epistaxis, hematuria or hematochezia. Her skin rash does not bother her She denies diffuse lymphadenopathy  SUMMARY OF ONCOLOGIC HISTORY: Oncology History Overview Note  Del 13 q   Chronic lymphocytic leukemia ([place])  03/15/2009 Pathology Results   Case #: [id number]  flow cytometry of peripheral blood comfirmed CLL.   04/25/2017 Pathology Results   FISH panel is positive for deletion 13 q only     REVIEW OF SYSTEMS:   Constitutional: Denies fevers,  chills or abnormal weight loss Eyes: Denies blurriness of vision Ears, nose, mouth, throat, and face: Denies mucositis or sore throat Respiratory: Denies cough, dyspnea or wheezes Gastrointestinal:  Denies nausea, heartburn or change in bowel habits Lymphatics: Denies new lymphadenopathy or easy bruising Neurological:Denies numbness, tingling or new weaknesses Behavioral/Psych: Mood is stable, no new changes  All other systems were reviewed with the patient and are negative.  I have reviewed the past medical history, past surgical history, social history and family history with the patient and they are unchanged from previous note.  ALLERGIES:  is allergic to codeine and levofloxacin.  MEDICATIONS:  Current Outpatient Medications  Medication Sig Dispense Refill  . amLODipine (NORVASC) 2.5 MG tablet Take 1 tablet (2.5 mg total) by mouth daily. 90 tablet 1  . aspirin 81 MG tablet Take 81 mg by mouth daily.    Marland Kitchen augmented betamethasone dipropionate (DIPROLENE-AF) 1.27 % cream 1 APPLICATION APPLY ON THE SKIN TWICE A DAY APPLY TO AFFECTED AREAS AS NEEDED FLARES  2  . BESIVANCE 0.6 % SUSP 4 drops daily for 2 days.  12  . Bevacizumab (AVASTIN) 100 MG/4ML SOLN Inject into the vein.    . calcium-vitamin D (OSCAL WITH D) 500-200 MG-UNIT tablet Take 1 tablet by mouth 2 (two) times daily.    Marland Kitchen denosumab (PROLIA) 60 MG/ML SOLN injection Inject 60 mg into the skin every 6 (six) months. Administer in upper arm, thigh, or abdomen    . ibuprofen (ADVIL) 200 MG tablet Take 200 mg by mouth every 6 (six) hours as needed.      . nystatin cream (MYCOSTATIN)  Apply 1 application topically 2 (two) times daily. 30 g 3  . terconazole (TERAZOL 7) 0.4 % vaginal cream INSERT ONE APPLICATOR VAGINALLY EVERY 2 WEEKS 45 g 1  . triamcinolone ointment (KENALOG) 0.5 % Apply 1 application topically 2 (two) times daily. 30 g 0   No current facility-administered medications for this visit.     PHYSICAL EXAMINATION: ECOG  PERFORMANCE STATUS: 1 - Symptomatic but completely ambulatory  Vitals:   02/02/19 1107  BP: (!) 146/65  Pulse: 71  Resp: 18  Temp: 98.7 F (37.1 C)  SpO2: 98%   Filed Weights   02/02/19 1107  Weight: 100 lb 9.6 oz (45.6 kg)    GENERAL:alert, no distress and comfortable.  She looks chronically thin SKIN: She has persistent skin rash on both legs consistent with vasculitic changes  eYES: normal, Conjunctiva are pink and non-injected, sclera clear OROPHARYNX:no exudate, no erythema and lips, buccal mucosa, and tongue normal  NECK: supple, thyroid normal size, non-tender, without nodularity LYMPH:  no palpable lymphadenopathy in the cervical, axillary or inguinal LUNGS: clear to auscultation and percussion with normal breathing effort HEART: regular rate & rhythm and no murmurs and no lower extremity edema ABDOMEN:abdomen soft, non-tender and normal bowel sounds.  She has small palpable splenomegaly Musculoskeletal:no cyanosis of digits and no clubbing  NEURO: alert & oriented x 3 with fluent speech, no focal motor/sensory deficits  LABORATORY DATA:  I have reviewed the data as listed    Component Value Date/Time   NA 141 02/02/2019 1042   NA 141 04/25/2017 1041   K 4.4 02/02/2019 1042   K 4.6 04/25/2017 1041   CL 107 02/02/2019 1042   CL 104 10/27/2012 1209   CO2 26 02/02/2019 1042   CO2 26 04/25/2017 1041   GLUCOSE 87 02/02/2019 1042   GLUCOSE 97 04/25/2017 1041   GLUCOSE 104 (H) 10/27/2012 1209   BUN 16 02/02/2019 1042   BUN 13.1 04/25/2017 1041   CREATININE 0.83 02/02/2019 1042   CREATININE 0.8 04/25/2017 1041   CALCIUM 9.3 02/02/2019 1042   CALCIUM 9.8 04/25/2017 1041   PROT 6.7 02/02/2019 1042   PROT 7.5 04/25/2017 1041   ALBUMIN 4.1 02/02/2019 1042   ALBUMIN 4.4 04/25/2017 1041   AST 25 02/02/2019 1042   AST 23 04/25/2017 1041   ALT 22 02/02/2019 1042   ALT 14 04/25/2017 1041   ALKPHOS 63 02/02/2019 1042   ALKPHOS 69 04/25/2017 1041   BILITOT 0.8  02/02/2019 1042   BILITOT 0.95 04/25/2017 1041   GFRNONAA >60 02/02/2019 1042   GFRAA >60 02/02/2019 1042    No results found for: SPEP, UPEP  Lab Results  Component Value Date   WBC 92.9 (HH) 02/02/2019   NEUTROABS 2.9 02/02/2019   HGB 12.3 02/02/2019   HCT 38.9 02/02/2019   MCV 104.9 (H) 02/02/2019   PLT 108 (L) 02/02/2019      Chemistry      Component Value Date/Time   NA 141 02/02/2019 1042   NA 141 04/25/2017 1041   K 4.4 02/02/2019 1042   K 4.6 04/25/2017 1041   CL 107 02/02/2019 1042   CL 104 10/27/2012 1209   CO2 26 02/02/2019 1042   CO2 26 04/25/2017 1041   BUN 16 02/02/2019 1042   BUN 13.1 04/25/2017 1041   CREATININE 0.83 02/02/2019 1042   CREATININE 0.8 04/25/2017 1041      Component Value Date/Time   CALCIUM 9.3 02/02/2019 1042   CALCIUM 9.8 04/25/2017 1041  ALKPHOS 63 02/02/2019 1042   ALKPHOS 69 04/25/2017 1041   AST 25 02/02/2019 1042   AST 23 04/25/2017 1041   ALT 22 02/02/2019 1042   ALT 14 04/25/2017 1041   BILITOT 0.8 02/02/2019 1042   BILITOT 0.95 04/25/2017 1041      All questions were answered. The patient knows to call the clinic with any problems, questions or concerns. No barriers to learning was detected.  I spent 15 minutes counseling the patient face to face. The total time spent in the appointment was 20 minutes and more than 50% was on counseling and review of test results  Heath Lark, MD 02/02/2019 11:50 AM

## 2019-02-02 NOTE — Assessment & Plan Note (Signed)
Could be due to CLL. She is not symptomatic. Observe only.

## 2019-02-02 NOTE — Assessment & Plan Note (Signed)
The skin rash on her legs are most likely related to her underlying malignancy I recommend follow-up with dermatologist  We discussed the role of prednisone but the patient declined for now.

## 2019-02-03 ENCOUNTER — Telehealth: Payer: Self-pay | Admitting: Hematology and Oncology

## 2019-02-03 NOTE — Telephone Encounter (Signed)
I talk with patient regarding schedule  

## 2019-02-05 ENCOUNTER — Telehealth: Payer: Self-pay | Admitting: *Deleted

## 2019-02-05 NOTE — Telephone Encounter (Signed)
Deductible Amount Met  OOP MAX $4000 ($37mt)  Annual exam 04/07/18 TF  Calcium 9.3            Date 02/02/2019  Upcoming dental procedures NO  Prior Authorization needed NO  Pt estimated Cost $90       Coverage Details: $50 one dose,$40 admin fee

## 2019-02-20 ENCOUNTER — Encounter: Payer: Self-pay | Admitting: Gynecology

## 2019-02-20 NOTE — Telephone Encounter (Signed)
I do not believe there is a contraindication for the Prolia.  The only thing I can think of is that Prolia does slightly increase the risk of infection and if chemotherapy can also increase the risk of infection.  I would recommend that she ask her oncologist who would be treating her with the chemotherapy.

## 2019-03-04 NOTE — Telephone Encounter (Signed)
Patient coming due for exam this fall and will discuss at that time.

## 2019-03-04 NOTE — Telephone Encounter (Addendum)
Pt called and left me a message stating she will not contiune Prolia will route to her Provider for notification of patients decision.

## 2019-04-09 ENCOUNTER — Encounter: Payer: Medicare Other | Admitting: Gynecology

## 2019-04-09 ENCOUNTER — Other Ambulatory Visit: Payer: Self-pay | Admitting: *Deleted

## 2019-04-09 MED ORDER — TERCONAZOLE 0.4 % VA CREA: TOPICAL_CREAM | VAGINAL | 3 refills | Status: DC

## 2019-04-09 NOTE — Telephone Encounter (Signed)
Dr. Phineas Real directions on Rx "INSERT ONE APPLICATOR VAGINALLY EVERY 2 WEEKS" last filled in May with 1 refill.

## 2019-04-10 ENCOUNTER — Telehealth: Payer: Self-pay

## 2019-04-10 NOTE — Telephone Encounter (Signed)
She called and left a message she would like a earlier appt. She has been having some pain. Next appt is 10/8.

## 2019-04-10 NOTE — Telephone Encounter (Signed)
Called and given below message. She verbalized understanding. Appt times given. She is having a lot of spleen pain and is taking Advil. Instructed to stop Advil and take tylenol 650 mg every 4 to 6 hours prn and call the office if pain is not controlled. She verbalized understanding.

## 2019-04-10 NOTE — Telephone Encounter (Signed)
I can see her next Thursday at 1130 am, 30 mins Labs to be done same day

## 2019-04-16 ENCOUNTER — Telehealth: Payer: Self-pay | Admitting: Hematology and Oncology

## 2019-04-16 ENCOUNTER — Inpatient Hospital Stay: Payer: Medicare Other | Attending: Hematology and Oncology

## 2019-04-16 ENCOUNTER — Encounter: Payer: Self-pay | Admitting: Hematology and Oncology

## 2019-04-16 ENCOUNTER — Other Ambulatory Visit: Payer: Self-pay

## 2019-04-16 ENCOUNTER — Other Ambulatory Visit: Payer: Self-pay | Admitting: Hematology and Oncology

## 2019-04-16 ENCOUNTER — Inpatient Hospital Stay: Payer: Medicare Other | Admitting: Hematology and Oncology

## 2019-04-16 VITALS — BP 145/77 | HR 69 | Temp 98.7°F | Resp 20 | Ht 60.0 in | Wt 105.0 lb

## 2019-04-16 DIAGNOSIS — C866 Primary cutaneous CD30-positive T-cell proliferations: Secondary | ICD-10-CM | POA: Diagnosis not present

## 2019-04-16 DIAGNOSIS — R1032 Left lower quadrant pain: Secondary | ICD-10-CM

## 2019-04-16 DIAGNOSIS — Z23 Encounter for immunization: Secondary | ICD-10-CM | POA: Insufficient documentation

## 2019-04-16 DIAGNOSIS — R161 Splenomegaly, not elsewhere classified: Secondary | ICD-10-CM | POA: Insufficient documentation

## 2019-04-16 DIAGNOSIS — Z79899 Other long term (current) drug therapy: Secondary | ICD-10-CM | POA: Insufficient documentation

## 2019-04-16 DIAGNOSIS — L989 Disorder of the skin and subcutaneous tissue, unspecified: Secondary | ICD-10-CM | POA: Diagnosis not present

## 2019-04-16 DIAGNOSIS — G893 Neoplasm related pain (acute) (chronic): Secondary | ICD-10-CM | POA: Insufficient documentation

## 2019-04-16 DIAGNOSIS — R109 Unspecified abdominal pain: Secondary | ICD-10-CM | POA: Diagnosis not present

## 2019-04-16 DIAGNOSIS — C9110 Chronic lymphocytic leukemia of B-cell type not having achieved remission: Secondary | ICD-10-CM | POA: Diagnosis not present

## 2019-04-16 LAB — CBC WITH DIFFERENTIAL/PLATELET
Abs Immature Granulocytes: 0.09 10*3/uL — ABNORMAL HIGH (ref 0.00–0.07)
Basophils Absolute: 0 10*3/uL (ref 0.0–0.1)
Basophils Relative: 0 %
Eosinophils Absolute: 0.1 10*3/uL (ref 0.0–0.5)
Eosinophils Relative: 0 %
HCT: 39 % (ref 36.0–46.0)
Hemoglobin: 12.3 g/dL (ref 12.0–15.0)
Immature Granulocytes: 0 %
Lymphocytes Relative: 94 %
Lymphs Abs: 90 10*3/uL — ABNORMAL HIGH (ref 0.7–4.0)
MCH: 33.3 pg (ref 26.0–34.0)
MCHC: 31.5 g/dL (ref 30.0–36.0)
MCV: 105.7 fL — ABNORMAL HIGH (ref 80.0–100.0)
Monocytes Absolute: 3.1 10*3/uL — ABNORMAL HIGH (ref 0.1–1.0)
Monocytes Relative: 3 %
Neutro Abs: 3.2 10*3/uL (ref 1.7–7.7)
Neutrophils Relative %: 3 %
Platelets: 117 10*3/uL — ABNORMAL LOW (ref 150–400)
RBC: 3.69 MIL/uL — ABNORMAL LOW (ref 3.87–5.11)
RDW: 13.3 % (ref 11.5–15.5)
WBC: 96.5 10*3/uL (ref 4.0–10.5)
nRBC: 0 % (ref 0.0–0.2)

## 2019-04-16 LAB — LACTATE DEHYDROGENASE: LDH: 191 U/L (ref 98–192)

## 2019-04-16 LAB — COMPREHENSIVE METABOLIC PANEL
ALT: 32 U/L (ref 0–44)
AST: 34 U/L (ref 15–41)
Albumin: 4.5 g/dL (ref 3.5–5.0)
Alkaline Phosphatase: 87 U/L (ref 38–126)
Anion gap: 8 (ref 5–15)
BUN: 21 mg/dL (ref 8–23)
CO2: 26 mmol/L (ref 22–32)
Calcium: 9.8 mg/dL (ref 8.9–10.3)
Chloride: 107 mmol/L (ref 98–111)
Creatinine, Ser: 0.81 mg/dL (ref 0.44–1.00)
GFR calc Af Amer: >60 mL/min (ref >60)
GFR calc non Af Amer: >60 mL/min (ref >60)
Glucose, Bld: 95 mg/dL (ref 70–99)
Potassium: 4.3 mmol/L (ref 3.5–5.1)
Sodium: 141 mmol/L (ref 135–145)
Total Bilirubin: 0.8 mg/dL (ref 0.3–1.2)
Total Protein: 7 g/dL (ref 6.5–8.1)

## 2019-04-16 LAB — URIC ACID: Uric Acid, Serum: 4.2 mg/dL (ref 2.5–7.1)

## 2019-04-16 MED ORDER — TRAMADOL HCL 50 MG PO TABS: 50.0000 mg | ORAL_TABLET | Freq: Four times a day (QID) | ORAL | 0 refills | Status: DC | PRN

## 2019-04-16 NOTE — Assessment & Plan Note (Signed)
She is symptomatic with recurrent abdominal pain Even though exam does not reveal any palpable mass, retroperitoneal lymphadenopathy from CLL or other form of malignancy cannot be excluded With her significant elevated white count, along with thrombocytopenia and others, I believe she is symptomatic from CLL I recommend staging scan with CT scan of the chest, abdomen and pelvis for further evaluation and she agreed I will see her back next week for further follow-up and review test results

## 2019-04-16 NOTE — Assessment & Plan Note (Signed)
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

## 2019-04-16 NOTE — Telephone Encounter (Signed)
I talk with patient regarding 9/24

## 2019-04-16 NOTE — Assessment & Plan Note (Signed)
I believe her abdominal pain is related to disease I recommend a trial of tramadol along with Tylenol as needed I will assess pain control next week

## 2019-04-16 NOTE — Progress Notes (Signed)
[facility] OFFICE PROGRESS NOTE  Patient Care Team: Isaac Bliss, Rayford Halsted, MD as PCP - General (Internal Medicine)  ASSESSMENT & PLAN:  Chronic lymphocytic leukemia She is symptomatic with recurrent abdominal pain Even though exam does not reveal any palpable mass, retroperitoneal lymphadenopathy from CLL or other form of malignancy cannot be excluded With her significant elevated white count, along with thrombocytopenia and others, I believe she is symptomatic from CLL I recommend staging scan with CT scan of the chest, abdomen and pelvis for further evaluation and she agreed I will see her back next week for further follow-up and review test results  Cancer associated pain I believe her abdominal pain is related to disease I recommend a trial of tramadol along with Tylenol as needed I will assess pain control next week  Skin lesion She had recent skin biopsy I have received a copy of test results from her dermatologist 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    Orders Placed This Encounter  Procedures  . CT CHEST W CONTRAST    Standing Status:   Future    Standing Expiration Date:   04/15/2020    Order Specific Question:   If indicated for the ordered procedure, I authorize the administration of contrast media per Radiology protocol    Answer:   Yes    Order Specific Question:   Preferred imaging location?    Answer:   [facility]    Order Specific Question:   Radiology Contrast Protocol - do NOT remove file path    Answer:   \\[email or web address]  . CT ABDOMEN PELVIS W CONTRAST    Standing Status:   Future    Standing Expiration Date:   04/15/2020    Order Specific Question:   If indicated for the ordered procedure, I authorize the administration of contrast media per Radiology protocol    Answer:   Yes    Order Specific  Question:   Preferred imaging location?    Answer:   [facility]    Order Specific Question:   Radiology Contrast Protocol - do NOT remove file path    Answer:   \\[email or web address]    INTERVAL HISTORY: Please see below for problem oriented charting. She returns for further follow-up She complained of severe recurrent abdominal pain since last week Her pain is located on the left side deep in her abdomen, lasting for few hours She rated it at 7-8 out of 10 She had taken some Tylenol without much success She denies recent changes in appetite or bowel habits  SUMMARY OF ONCOLOGIC HISTORY: Oncology History Overview Note  Del 13 q   Chronic lymphocytic leukemia ([place])  03/15/2009 Pathology Results   Case #: [id number]  flow cytometry of peripheral blood comfirmed CLL.   04/25/2017 Pathology Results   FISH panel is positive for deletion 13 q only     REVIEW OF SYSTEMS:   Constitutional: Denies fevers, chills or abnormal weight loss Eyes: Denies blurriness of vision Ears, nose, mouth, throat, and face: Denies mucositis or sore throat Respiratory: Denies cough, dyspnea or wheezes Cardiovascular: Denies palpitation, chest discomfort or lower extremity swelling Gastrointestinal:  Denies nausea, heartburn or change in bowel habits Skin: Denies abnormal skin rashes Lymphatics: Denies new lymphadenopathy or easy bruising Neurological:Denies numbness, tingling or new weaknesses Behavioral/Psych: Mood is stable, no new changes  All other systems were reviewed with the patient and are negative.  I have reviewed the past medical history, past surgical history, social history and family history with the patient and they are unchanged from previous note.  ALLERGIES:  is allergic to codeine and levofloxacin.  MEDICATIONS:  Current Outpatient Medications  Medication Sig Dispense Refill  . amLODipine (NORVASC) 2.5 MG tablet Take 1 tablet (2.5 mg total) by mouth  daily. 90 tablet 1  . aspirin 81 MG tablet Take 81 mg by mouth daily.    Marland Kitchen augmented betamethasone dipropionate (DIPROLENE-AF) [id number] % cream 1 APPLICATION APPLY ON THE SKIN TWICE A DAY APPLY TO AFFECTED AREAS AS NEEDED FLARES  2  . BESIVANCE 0.6 % SUSP 4 drops daily for 2 days.  12  . Bevacizumab (AVASTIN) 100 MG/4ML SOLN Inject into the vein.    . calcium-vitamin D (OSCAL WITH D) 500-200 MG-UNIT tablet Take 1 tablet by mouth 2 (two) times daily.    Marland Kitchen ibuprofen (ADVIL) 200 MG tablet Take 200 mg by mouth every 6 (six) hours as needed.      . nystatin cream (MYCOSTATIN) Apply 1 application topically 2 (two) times daily. 30 g 3  . terconazole (TERAZOL 7) 0.4 % vaginal cream INSERT ONE APPLICATOR VAGINALLY EVERY 2 WEEKS 45 g 3  . traMADol (ULTRAM) 50 MG tablet Take 1 tablet (50 mg total) by mouth every 6 (six) hours as needed. 30 tablet 0  . triamcinolone ointment (KENALOG) 0.5 % Apply 1 application topically 2 (two) times daily. 30 g 0   No current facility-administered medications for this visit.     PHYSICAL EXAMINATION: ECOG PERFORMANCE STATUS: 1 - Symptomatic but completely ambulatory  Vitals:   04/16/19 1110  BP: (!) 145/77  Pulse: 69  Resp: 20  Temp: 98.7 F (37.1 C)  SpO2: 100%   Filed Weights   04/16/19 1110  Weight: 105 lb (47.6 kg)    GENERAL:alert, no distress and comfortable SKIN: skin color, texture, turgor are normal, no rashes or significant lesions EYES: normal, Conjunctiva are pink and non-injected, sclera clear OROPHARYNX:no exudate, no erythema and lips, buccal mucosa, and tongue normal  NECK: supple, thyroid normal size, non-tender, without nodularity LYMPH:  no palpable lymphadenopathy in the cervical, axillary or inguinal LUNGS: clear to auscultation and percussion with normal breathing effort HEART: regular rate & rhythm and no murmurs and no lower extremity edema ABDOMEN:abdomen soft, non-tender and normal bowel sounds Musculoskeletal:no cyanosis of digits  and no clubbing  NEURO: alert & oriented x 3 with fluent speech, no focal motor/sensory deficits  LABORATORY DATA:  I have reviewed the data as listed    Component Value Date/Time   NA 141 04/16/2019 1055   NA 141 04/25/2017 1041   K 4.3 04/16/2019 1055   K 4.6 04/25/2017 1041   CL 107 04/16/2019 1055   CL 104 10/27/2012 1209   CO2 26 04/16/2019 1055   CO2 26 04/25/2017 1041   GLUCOSE 95 04/16/2019 1055   GLUCOSE 97 04/25/2017 1041   GLUCOSE 104 (H) 10/27/2012 1209   BUN 21 04/16/2019 1055   BUN 13.1 04/25/2017 1041   CREATININE 0.81 04/16/2019 1055   CREATININE 0.8 04/25/2017 1041   CALCIUM 9.8 04/16/2019 1055   CALCIUM 9.8 04/25/2017 1041   PROT 7.0 04/16/2019 1055   PROT 7.5 04/25/2017 1041   ALBUMIN 4.5 04/16/2019 1055   ALBUMIN 4.4 04/25/2017 1041   AST 34 04/16/2019 1055   AST 23 04/25/2017 1041   ALT 32 04/16/2019 1055   ALT 14 04/25/2017 1041   ALKPHOS 87  04/16/2019 1055   ALKPHOS 69 04/25/2017 1041   BILITOT 0.8 04/16/2019 1055   BILITOT 0.95 04/25/2017 1041   GFRNONAA >60 04/16/2019 1055   GFRAA >60 04/16/2019 1055    No results found for: SPEP, UPEP  Lab Results  Component Value Date   WBC 96.5 (HH) 04/16/2019   NEUTROABS 3.2 04/16/2019   HGB 12.3 04/16/2019   HCT 39.0 04/16/2019   MCV 105.7 (H) 04/16/2019   PLT 117 (L) 04/16/2019      Chemistry      Component Value Date/Time   NA 141 04/16/2019 1055   NA 141 04/25/2017 1041   K 4.3 04/16/2019 1055   K 4.6 04/25/2017 1041   CL 107 04/16/2019 1055   CL 104 10/27/2012 1209   CO2 26 04/16/2019 1055   CO2 26 04/25/2017 1041   BUN 21 04/16/2019 1055   BUN 13.1 04/25/2017 1041   CREATININE 0.81 04/16/2019 1055   CREATININE 0.8 04/25/2017 1041      Component Value Date/Time   CALCIUM 9.8 04/16/2019 1055   CALCIUM 9.8 04/25/2017 1041   ALKPHOS 87 04/16/2019 1055   ALKPHOS 69 04/25/2017 1041   AST 34 04/16/2019 1055   AST 23 04/25/2017 1041   ALT 32 04/16/2019 1055   ALT 14 04/25/2017 1041    BILITOT 0.8 04/16/2019 1055   BILITOT 0.95 04/25/2017 1041       All questions were answered. The patient knows to call the clinic with any problems, questions or concerns. No barriers to learning was detected.  I spent 25 minutes counseling the patient face to face. The total time spent in the appointment was 30 minutes and more than 50% was on counseling and review of test results  Heath Lark, MD 04/16/2019 12:03 PM

## 2019-04-22 ENCOUNTER — Ambulatory Visit (HOSPITAL_COMMUNITY)
Admission: RE | Admit: 2019-04-22 | Discharge: 2019-04-22 | Disposition: A | Discharge status: Home / Self Care | Payer: Medicare Other | Source: Ambulatory Visit | Attending: Hematology and Oncology | Admitting: Hematology and Oncology

## 2019-04-22 ENCOUNTER — Encounter (HOSPITAL_COMMUNITY): Payer: Self-pay

## 2019-04-22 ENCOUNTER — Other Ambulatory Visit: Payer: Self-pay

## 2019-04-22 DIAGNOSIS — C9110 Chronic lymphocytic leukemia of B-cell type not having achieved remission: Secondary | ICD-10-CM

## 2019-04-22 MED ORDER — SODIUM CHLORIDE (PF) 0.9 % IJ SOLN
INTRAMUSCULAR | Status: AC
  Filled 2019-04-22: qty 50

## 2019-04-22 MED ORDER — IOHEXOL 300 MG/ML  SOLN
75.0000 mL | Freq: Once | INTRAMUSCULAR | Status: AC | PRN
  Administered 2019-04-22: 75 mL via INTRAVENOUS

## 2019-04-23 ENCOUNTER — Encounter: Payer: Self-pay | Admitting: Hematology and Oncology

## 2019-04-23 ENCOUNTER — Telehealth: Payer: Self-pay | Admitting: Pharmacist

## 2019-04-23 ENCOUNTER — Other Ambulatory Visit: Payer: Self-pay

## 2019-04-23 ENCOUNTER — Telehealth: Payer: Self-pay

## 2019-04-23 ENCOUNTER — Inpatient Hospital Stay: Payer: Medicare Other | Admitting: Hematology and Oncology

## 2019-04-23 VITALS — BP 134/65 | HR 74 | Temp 98.9°F | Resp 18 | Ht 60.0 in | Wt 100.8 lb

## 2019-04-23 DIAGNOSIS — G893 Neoplasm related pain (acute) (chronic): Secondary | ICD-10-CM

## 2019-04-23 DIAGNOSIS — Z23 Encounter for immunization: Secondary | ICD-10-CM | POA: Diagnosis not present

## 2019-04-23 DIAGNOSIS — C9110 Chronic lymphocytic leukemia of B-cell type not having achieved remission: Secondary | ICD-10-CM | POA: Diagnosis not present

## 2019-04-23 MED ORDER — IBRUTINIB 280 MG PO TABS: 280.0000 mg | ORAL_TABLET | Freq: Every day | ORAL | 11 refills | Status: DC

## 2019-04-23 MED ORDER — PROCHLORPERAZINE MALEATE 5 MG PO TABS: 5.0000 mg | ORAL_TABLET | Freq: Four times a day (QID) | ORAL | 1 refills | Status: DC | PRN

## 2019-04-23 MED ORDER — INFLUENZA VAC A&B SA ADJ QUAD 0.5 ML IM PRSY
0.5000 mL | PREFILLED_SYRINGE | Freq: Once | INTRAMUSCULAR | Status: AC
  Administered 2019-04-23: 0.5 mL via INTRAMUSCULAR

## 2019-04-23 MED ORDER — ACYCLOVIR 400 MG PO TABS: 400.0000 mg | ORAL_TABLET | Freq: Every day | ORAL | 6 refills | Status: DC

## 2019-04-23 MED ORDER — ALLOPURINOL 300 MG PO TABS: 300.0000 mg | ORAL_TABLET | Freq: Every day | ORAL | 0 refills | Status: DC

## 2019-04-23 NOTE — Telephone Encounter (Signed)
Oral Oncology Pharmacist Encounter  Received new prescription for Imbruvica (ibrutinib) for the treatment of chronic lymphocytic leukemia, planned duration until disease progression or unacceptable toxicity.  Original diagnosis in 2010 Patient has been on active surveillance since that time. She is now symptomatic from her CLL, lymphocyte count is elevated, and patient now with mild thrombocytopenia. Patient is under evaluation to initiation treatment with ibrutinib, dose reduced to 280 mg once daily at initiation due to advanced age and low body weight.  Dose may increase in the future to target dose of 420 mg once daily pending benefit and toleration.  Labs from 04/16/2019 assessed, OK for treatment initiation. BPs reviewed, some readings are high, BP will continue to be monitored No other cardiac history is noted. EKG repeated today, patient in NSR, QTc 373 msec  Current medication list in Epic reviewed, moderate DDIs with ibrutinib identified:  Category C interactions with ibuprofen and aspirin: all 3 medications with anti-platelet properties. Patient will informed about moderate medication interactions and will be assessed for frequency of ibruprofen use. Patient is noted with mild thrombocytopenia. She will be counseled about risk of bruising and bleeding. Platelet count will be closely monitored during treatment. No change to current therapy is indicated at this time.  Prescription has been e-scribed to the [facility] for benefits analysis and approval by MD.  Oral Oncology Clinic will continue to follow for insurance authorization, copayment issues, initial counseling and start date.  Johny Drilling, PharmD, BCPS, BCOP  04/23/2019 11:57 AM Oral Oncology Clinic [phone]

## 2019-04-23 NOTE — Telephone Encounter (Signed)
Oral Oncology Patient Advocate Encounter  Received notification from [employer] that prior authorization for Imbruvica is required.  PA submitted on CoverMyMeds Key [id number] Status is pending  Oral Oncology Clinic will continue to follow.  [place] Phone [phone] Fax [phone] 04/23/2019    1:01 PM

## 2019-04-23 NOTE — Telephone Encounter (Addendum)
Oral Chemotherapy Pharmacist Encounter   I spoke with patient in exam room for overview of: Imbruvica (ibrutinib) for the treatment of chronic lymphocytic leukemia, planned duration until disease progression or unacceptable toxicity.   Counseled patient on administration, dosing, side effects, monitoring, drug-food interactions, safe handling, storage, and disposal.  Patient will take Imbruvica 280 mg tablets, 1 tablet (280mg ) by mouth once daily.  Patient will take Imbruvica at approximately the same time each day with a full glass of water and maintain adequate hydration throughout the day.  Patient informed that dose of ibrutinib may increase in the future based on benefit and toleration.  Patient knows to avoid grapefruit or grapefruit juice while on therapy with Imbruvica.  Imbruvica start date: TBD pending medication acquisition  Adverse effects include but are not limited to: bruising, decreased blood counts, N/V, diarrhea, musculoskeletal pain, arthralgias, peripheral edema, and hemorrhage.   Patient will obtain anti diarrheal and alert the office of 4 or more loose stools above baseline.  Reviewed with patient importance of keeping a medication schedule and plan for any missed doses.  Medication reconciliation performed and medication/allergy list updated. Patient states she discontinued use of aspirin 81 mg daily quite some time ago. It has been discontinued from her medication list. She also states she take ibuprofen very sparingly, and we discussed medication options for pain control. She will acetaminophen, the tramadol, then ibuprofen. She will alert the office if this combination does not alleviate symptoms. Patient informed that disease control may help with the abdominal pain as well.  Insurance authorization for Kate Sable has been obtained. Test claim at the pharmacy revealed copayment $100 for 1st fill of Imbruvica. Oral oncology patient advocate was successful in  securing foundation copayment grant from [employer] to cover the out of pocket expenses for [place] at the pharmacy. Patient's out of pocket is now $0  Patient will pick up her 1st fill of Imbruvica from the [facility] on 04/24/2019.  Patient informed the pharmacy will reach out 5-7 days prior to needing next fill of Imbruvica to coordinate continued medication acquisition to prevent break in therapy.  All questions answered.  Mrs. Gerdeman voiced understanding and appreciation.   Patient knows to call the office with questions or concerns.  Johny Drilling, PharmD, BCPS, BCOP  04/23/2019   12:10 PM Oral Oncology Clinic [phone]

## 2019-04-23 NOTE — Progress Notes (Signed)
[place] OFFICE PROGRESS NOTE  Patient Care Team: Isaac Bliss, Rayford Halsted, MD as PCP - General (Internal Medicine)  ASSESSMENT & PLAN:  Chronic lymphocytic leukemia She is symptomatic from splenomegaly She could not tolerate tramadol well I recommend we proceed with treatment as soon as possible She agreed with the plan of care  The decision was made based on publication at the [facility]. It is a category 1 recommendation from NCCN.  Ibrutinib versus Ofatumumab in Previously Treated Chronic Lymphoid Leukemia Gasper Lloyd, M.D., Edmonia Lynch. Owens Shark, M.D., Ph.D., Douglass Rivers, M.D., Link Snuffer, M.D., Jeanne Ivan, M.D., Serita Sheller, M.B., B.S., Lauris Chroman, M.D., Hilary Hertz. Marti Sleigh, M.D., Macarthur Critchley, M.B., B.S., Ph.D., Chrisandra Carota, M.D., Milly Jakob, M.D., Carey Bullocks. Aris Lot, M.D., Jeanine Luz. Valera Castle, M.D., Mahala Menghini, M.D., Yvonne Kendall, M.D., Harland German, M.D., Ph.D., Laury Deep, M.D., Evelena Leyden, M.D., Assunta Gambles, M.D., Esau Grew, M.D., Wyatt Mage, M.D., Joyce Gross, M.D., Olena Mater, M.D., Minda Ditto, M.D., William Dalton, M.D., Stark Klein, Ph.D., F.R.C.Path., Carmelina Peal, M.D., Neal Dy, M.D., Gentry Fitz, M.D., Vida Rigger. Sherrian Divers, M.D., Lenoard Aden, Ph.D., Earnestine Mealing, M.D., Oretha Caprice, D.[place]., Lowry Ram, M.D., and Raford Pitcher, M.B., Ch.B., Ph.D., for the [employer]* [facility] Med [id number] 17, 2014DOI: 10.1056/[id number]  The chemotherapy consists of Ibrutinib, a Bruton's tyrosine kinase inhibitor. We discussed the role of chemotherapy. The intent is for palliative.   Ibrutinib is taken as a single agent oral treatment indefinitely until unexpected side-effects or lack of response.  In this multicenter, open-label, phase 3 study, we randomly assigned 391 patients with relapsed or refractory CLL or SLL to receive daily ibrutinib or the  anti-CD20 antibody ofatumumab. The primary end point was the duration of progression-free survival, with the duration of overall survival and the overall response rate as secondary end points.  At a median follow-up of 9.4 months, ibrutinib significantly improved progression-free survival; the median duration was not reached in the ibrutinib group (with a rate of progression-free survival of 88% at 6 months), as compared with a median of 8.1 months in the ofatumumab group (hazard ratio for progression or death in the ibrutinib group, 0.22; P<0.001). Ibrutinib also significantly improved overall survival (hazard ratio for death, 0.43; P=0.005). At 12 months, the overall survival rate was 90% in the ibrutinib group and 81% in the ofatumumab group. The overall response rate was significantly higher in the ibrutinib group than in the ofatumumab group (42.6% vs. 4.1%, P<0.001). An additional 20% of ibrutinib-treated patients had a partial response with lymphocytosis. Similar effects were observed regardless of whether patients had a chromosome 17p13.1 deletion or resistance to purine analogues.   The most frequent nonhematologic adverse events were diarrhea, fatigue, pyrexia, and nausea in the ibrutinib group and fatigue, infusion-related reactions, and cough in the ofatumumab group.  Some of the short term side-effects included, though not limited to, risk of fatigue, weight loss, tumor lysis syndrome, risk of allergic reactions, pancytopenia, bruising, diarrhea, transient leukocytosis, life-threatening infections, need for transfusions of blood products, irregular heart beat, nausea, vomiting, change in bowel habits, admission to hospital for various reasons, and risks of death.   The patient is aware that the response rates discussed earlier is not guaranteed.    After a long discussion, patient made an informed decision to proceed with the prescribed plan of care.   I will get baseline EKG She will come  back for weekly blood draw as  soon as she start her treatment Due to her age and her low body weight, I will start her at reduced dose 240 mg daily I will get my pharmacist to help with insurance prior authorization and approval  We discussed the importance of preventive care and reviewed the vaccination programs. She does not have any prior allergic reactions to influenza vaccination. She agrees to proceed with influenza vaccination today and we will administer it today at the clinic.  I recommend allopurinol for tumor lysis prophylaxis I recommend acyclovir for antimicrobial prophylaxis  Cancer associated pain She tolerated tramadol poorly due to nausea I recommend trial of antiemetics   Orders Placed This Encounter  Procedures  . Comprehensive metabolic panel    Standing Status:   Standing    Number of Occurrences:   9    Standing Expiration Date:   04/22/2020  . CBC with Differential/Platelet    Standing Status:   Standing    Number of Occurrences:   9    Standing Expiration Date:   04/22/2020  . Lactate dehydrogenase    Standing Status:   Standing    Number of Occurrences:   9    Standing Expiration Date:   04/22/2020  . Uric acid    Standing Status:   Standing    Number of Occurrences:   9    Standing Expiration Date:   04/22/2020    INTERVAL HISTORY: Please see below for problem oriented charting. She returns to review test results She tolerated tramadol poorly due to nausea She continues to have intermittent discomfort over her left upper quadrant  SUMMARY OF ONCOLOGIC HISTORY: Oncology History Overview Note  Del 13 q   Chronic lymphocytic leukemia ([place])  03/15/2009 Pathology Results   Case #: [id number]  flow cytometry of peripheral blood comfirmed CLL.   04/25/2017 Pathology Results   FISH panel is positive for deletion 13 q only   04/22/2019 Imaging   Ct scan of the chest, abdomen and pelvis 1. No adenopathy within the chest, abdomen or pelvis. 2. Splenomegaly  with scattered low-density lesions measuring up to 1.3 cm. 3. Small subpleural nodule in the left lower lobe and right middle lobe measuring up to 5 mm. No follow-up needed if patient is low-risk (and has no known or suspected primary neoplasm). Non-contrast chest CT can be considered in 12 months if patient is high-risk. This recommendation follows the consensus statement: Guidelines for Management of Incidental Pulmonary Nodules Detected on CT Images: From the Fleischner Society 2017; Radiology 2017; 284:228-243. 4. Aortic Atherosclerosis (ICD10-I70.0). Coronary artery atherosclerotic calcifications.     04/23/2019 Cancer Staging   Staging form: Chronic Lymphocytic Leukemia / Small Lymphocytic Lymphoma, AJCC 8th Edition - Clinical stage from 04/23/2019: Modified Rai Stage IV (Modified Rai risk: High, Lymphocytosis: Present, Adenopathy: Absent, Organomegaly: Present, Anemia: Absent, Thrombocytopenia: Present) - Signed by Heath Lark, MD on 04/23/2019     REVIEW OF SYSTEMS:   Constitutional: Denies fevers, chills or abnormal weight loss Eyes: Denies blurriness of vision Ears, nose, mouth, throat, and face: Denies mucositis or sore throat Respiratory: Denies cough, dyspnea or wheezes Cardiovascular: Denies palpitation, chest discomfort or lower extremity swelling Gastrointestinal:  Denies nausea, heartburn or change in bowel habits Skin: Denies abnormal skin rashes Lymphatics: Denies new lymphadenopathy or easy bruising Neurological:Denies numbness, tingling or new weaknesses Behavioral/Psych: Mood is stable, no new changes  All other systems were reviewed with the patient and are negative.  I have reviewed the past medical history, past surgical history, social  history and family history with the patient and they are unchanged from previous note.  ALLERGIES:  is allergic to codeine and levofloxacin.  MEDICATIONS:  Current Outpatient Medications  Medication Sig Dispense Refill  . traMADol  (ULTRAM) 50 MG tablet Take 50 mg by mouth every 6 (six) hours as needed.    Marland Kitchen acyclovir (ZOVIRAX) 400 MG tablet Take 1 tablet (400 mg total) by mouth daily. 30 tablet 6  . allopurinol (ZYLOPRIM) 300 MG tablet Take 1 tablet (300 mg total) by mouth daily. 30 tablet 0  . amLODipine (NORVASC) 2.5 MG tablet Take 1 tablet (2.5 mg total) by mouth daily. 90 tablet 1  . aspirin 81 MG tablet Take 81 mg by mouth daily.    Marland Kitchen augmented betamethasone dipropionate (DIPROLENE-AF) [id number] % cream 1 APPLICATION APPLY ON THE SKIN TWICE A DAY APPLY TO AFFECTED AREAS AS NEEDED FLARES  2  . BESIVANCE 0.6 % SUSP 4 drops daily for 2 days.  12  . Bevacizumab (AVASTIN) 100 MG/4ML SOLN Inject into the vein.    . calcium-vitamin D (OSCAL WITH D) 500-200 MG-UNIT tablet Take 1 tablet by mouth 2 (two) times daily.    . Ibrutinib 280 MG TABS Take 280 mg by mouth daily. 30 tablet 11  . ibuprofen (ADVIL) 200 MG tablet Take 200 mg by mouth every 6 (six) hours as needed.      . nystatin cream (MYCOSTATIN) Apply 1 application topically 2 (two) times daily. 30 g 3  . prochlorperazine (COMPAZINE) 5 MG tablet Take 1 tablet (5 mg total) by mouth every 6 (six) hours as needed for nausea or vomiting. 30 tablet 1  . terconazole (TERAZOL 7) 0.4 % vaginal cream INSERT ONE APPLICATOR VAGINALLY EVERY 2 WEEKS 45 g 3  . triamcinolone ointment (KENALOG) 0.5 % Apply 1 application topically 2 (two) times daily. 30 g 0   Current Facility-Administered Medications  Medication Dose Route Frequency Provider Last Rate Last Dose  . influenza vaccine adjuvanted (FLUAD) injection 0.5 mL  0.5 mL Intramuscular Once Alvy Bimler, Sahara Fujimoto, MD        PHYSICAL EXAMINATION: ECOG PERFORMANCE STATUS: 1 - Symptomatic but completely ambulatory  Vitals:   04/23/19 1133  BP: 134/65  Pulse: 74  Resp: 18  Temp: 98.9 F (37.2 C)  SpO2: 100%   Filed Weights   04/23/19 1133  Weight: 100 lb 12.8 oz (45.7 kg)    GENERAL:alert, no distress and  comfortable Musculoskeletal:no cyanosis of digits and no clubbing  NEURO: alert & oriented x 3 with fluent speech, no focal motor/sensory deficits  LABORATORY DATA:  I have reviewed the data as listed    Component Value Date/Time   NA 141 04/16/2019 1055   NA 141 04/25/2017 1041   K 4.3 04/16/2019 1055   K 4.6 04/25/2017 1041   CL 107 04/16/2019 1055   CL 104 10/27/2012 1209   CO2 26 04/16/2019 1055   CO2 26 04/25/2017 1041   GLUCOSE 95 04/16/2019 1055   GLUCOSE 97 04/25/2017 1041   GLUCOSE 104 (H) 10/27/2012 1209   BUN 21 04/16/2019 1055   BUN 13.1 04/25/2017 1041   CREATININE 0.81 04/16/2019 1055   CREATININE 0.8 04/25/2017 1041   CALCIUM 9.8 04/16/2019 1055   CALCIUM 9.8 04/25/2017 1041   PROT 7.0 04/16/2019 1055   PROT 7.5 04/25/2017 1041   ALBUMIN 4.5 04/16/2019 1055   ALBUMIN 4.4 04/25/2017 1041   AST 34 04/16/2019 1055   AST 23 04/25/2017 1041   ALT 32 04/16/2019  1055   ALT 14 04/25/2017 1041   ALKPHOS 87 04/16/2019 1055   ALKPHOS 69 04/25/2017 1041   BILITOT 0.8 04/16/2019 1055   BILITOT 0.95 04/25/2017 1041   GFRNONAA >60 04/16/2019 1055   GFRAA >60 04/16/2019 1055    No results found for: SPEP, UPEP  Lab Results  Component Value Date   WBC 96.5 (HH) 04/16/2019   NEUTROABS 3.2 04/16/2019   HGB 12.3 04/16/2019   HCT 39.0 04/16/2019   MCV 105.7 (H) 04/16/2019   PLT 117 (L) 04/16/2019      Chemistry      Component Value Date/Time   NA 141 04/16/2019 1055   NA 141 04/25/2017 1041   K 4.3 04/16/2019 1055   K 4.6 04/25/2017 1041   CL 107 04/16/2019 1055   CL 104 10/27/2012 1209   CO2 26 04/16/2019 1055   CO2 26 04/25/2017 1041   BUN 21 04/16/2019 1055   BUN 13.1 04/25/2017 1041   CREATININE 0.81 04/16/2019 1055   CREATININE 0.8 04/25/2017 1041      Component Value Date/Time   CALCIUM 9.8 04/16/2019 1055   CALCIUM 9.8 04/25/2017 1041   ALKPHOS 87 04/16/2019 1055   ALKPHOS 69 04/25/2017 1041   AST 34 04/16/2019 1055   AST 23 04/25/2017 1041    ALT 32 04/16/2019 1055   ALT 14 04/25/2017 1041   BILITOT 0.8 04/16/2019 1055   BILITOT 0.95 04/25/2017 1041       RADIOGRAPHIC STUDIES: I have reviewed imaging study with the patient I have personally reviewed the radiological images as listed and agreed with the findings in the report. Ct Chest W Contrast  Result Date: 04/22/2019 CLINICAL DATA:  Chronic lymphocytic leukemia diagnosed 3/19. EXAM: CT CHEST, ABDOMEN, AND PELVIS WITH CONTRAST TECHNIQUE: Multidetector CT imaging of the chest, abdomen and pelvis was performed following the standard protocol during bolus administration of intravenous contrast. CONTRAST:  75mL OMNIPAQUE IOHEXOL 300 MG/ML  SOLN COMPARISON:  None FINDINGS: CT CHEST FINDINGS Cardiovascular: Normal heart size. No pericardial effusion. Mild aortic atherosclerosis. Lad coronary artery calcification. Mediastinum/Nodes: Heterogeneous appearance of the thyroid gland. The trachea appears patent and is midline. Normal appearance of the esophagus. No enlarged supraclavicular, mediastinal or hilar lymph nodes. No axillary adenopathy. Lungs/Pleura: No pleural effusion. Scarring identified within the anterior left lower lobe. Subpleural nodule in the lateral left lower lobe measures 5 mm, image 87/4. 3 mm subpleural nodule in the right middle lobe noted, image 85/4. 2 mm right upper lobe lung nodule is identified, image 66/4. Musculoskeletal: Scoliosis. No acute or suspicious osseous findings. CT ABDOMEN PELVIS FINDINGS Hepatobiliary: Anterior dome of liver cyst measures approximately 8 mm. Gallbladder unremarkable. No biliary dilatation. Pancreas: Unremarkable. No pancreatic ductal dilatation or surrounding inflammatory changes. Spleen: The spleen measures 10.2 x 5.7 by 13.1 cm (volume = 400 cm^3). Scattered low density foci throughout the splenic parenchyma are identified which measure up to 1.3 cm. Adrenals/Urinary Tract: Normal appearance of the adrenal glands. Bilateral parapelvic  cysts identified. No kidney mass or hydronephrosis. There is moderate distension of the urinary bladder. No focal bladder abnormality. Stomach/Bowel: The stomach is normal. The small bowel loops have a normal course and caliber. No abnormal bowel wall thickening, inflammation or distension. Vascular/Lymphatic: Aortic atherosclerosis. Retroperitoneal lymph nodes within the upper abdomen measure up to 9 mm. No adenopathy. Reproductive: Status post hysterectomy. No adnexal masses. Other: No abdominal wall hernia or abnormality. No abdominopelvic ascites. Musculoskeletal: Scoliosis and degenerative disc disease. No acute or significant osseous abnormality.  IMPRESSION: 1. No adenopathy within the chest, abdomen or pelvis. 2. Splenomegaly with scattered low-density lesions measuring up to 1.3 cm. 3. Small subpleural nodule in the left lower lobe and right middle lobe measuring up to 5 mm. No follow-up needed if patient is low-risk (and has no known or suspected primary neoplasm). Non-contrast chest CT can be considered in 12 months if patient is high-risk. This recommendation follows the consensus statement: Guidelines for Management of Incidental Pulmonary Nodules Detected on CT Images: From the Fleischner Society 2017; Radiology 2017; 284:228-243. 4. Aortic Atherosclerosis (ICD10-I70.0). Coronary artery atherosclerotic calcifications. Electronically Signed   By: Kerby Moors M.D.   On: 04/22/2019 14:32   Ct Abdomen Pelvis W Contrast  Result Date: 04/22/2019 CLINICAL DATA:  Chronic lymphocytic leukemia diagnosed 3/19. EXAM: CT CHEST, ABDOMEN, AND PELVIS WITH CONTRAST TECHNIQUE: Multidetector CT imaging of the chest, abdomen and pelvis was performed following the standard protocol during bolus administration of intravenous contrast. CONTRAST:  33mL OMNIPAQUE IOHEXOL 300 MG/ML  SOLN COMPARISON:  None FINDINGS: CT CHEST FINDINGS Cardiovascular: Normal heart size. No pericardial effusion. Mild aortic atherosclerosis.  Lad coronary artery calcification. Mediastinum/Nodes: Heterogeneous appearance of the thyroid gland. The trachea appears patent and is midline. Normal appearance of the esophagus. No enlarged supraclavicular, mediastinal or hilar lymph nodes. No axillary adenopathy. Lungs/Pleura: No pleural effusion. Scarring identified within the anterior left lower lobe. Subpleural nodule in the lateral left lower lobe measures 5 mm, image 87/4. 3 mm subpleural nodule in the right middle lobe noted, image 85/4. 2 mm right upper lobe lung nodule is identified, image 66/4. Musculoskeletal: Scoliosis. No acute or suspicious osseous findings. CT ABDOMEN PELVIS FINDINGS Hepatobiliary: Anterior dome of liver cyst measures approximately 8 mm. Gallbladder unremarkable. No biliary dilatation. Pancreas: Unremarkable. No pancreatic ductal dilatation or surrounding inflammatory changes. Spleen: The spleen measures 10.2 x 5.7 by 13.1 cm (volume = 400 cm^3). Scattered low density foci throughout the splenic parenchyma are identified which measure up to 1.3 cm. Adrenals/Urinary Tract: Normal appearance of the adrenal glands. Bilateral parapelvic cysts identified. No kidney mass or hydronephrosis. There is moderate distension of the urinary bladder. No focal bladder abnormality. Stomach/Bowel: The stomach is normal. The small bowel loops have a normal course and caliber. No abnormal bowel wall thickening, inflammation or distension. Vascular/Lymphatic: Aortic atherosclerosis. Retroperitoneal lymph nodes within the upper abdomen measure up to 9 mm. No adenopathy. Reproductive: Status post hysterectomy. No adnexal masses. Other: No abdominal wall hernia or abnormality. No abdominopelvic ascites. Musculoskeletal: Scoliosis and degenerative disc disease. No acute or significant osseous abnormality. IMPRESSION: 1. No adenopathy within the chest, abdomen or pelvis. 2. Splenomegaly with scattered low-density lesions measuring up to 1.3 cm. 3. Small  subpleural nodule in the left lower lobe and right middle lobe measuring up to 5 mm. No follow-up needed if patient is low-risk (and has no known or suspected primary neoplasm). Non-contrast chest CT can be considered in 12 months if patient is high-risk. This recommendation follows the consensus statement: Guidelines for Management of Incidental Pulmonary Nodules Detected on CT Images: From the Fleischner Society 2017; Radiology 2017; 284:228-243. 4. Aortic Atherosclerosis (ICD10-I70.0). Coronary artery atherosclerotic calcifications. Electronically Signed   By: Kerby Moors M.D.   On: 04/22/2019 14:32    All questions were answered. The patient knows to call the clinic with any problems, questions or concerns. No barriers to learning was detected.  I spent 30 minutes counseling the patient face to face. The total time spent in the appointment was 40 minutes and  more than 50% was on counseling and review of test results  Heath Lark, MD 04/23/2019 11:55 AM

## 2019-04-23 NOTE — Assessment & Plan Note (Addendum)
She is symptomatic from splenomegaly She could not tolerate tramadol well I recommend we proceed with treatment as soon as possible She agreed with the plan of care  The decision was made based on publication at the [employer]. It is a category 1 recommendation from NCCN.  Ibrutinib versus Ofatumumab in Previously Treated Chronic Lymphoid Leukemia Gasper Lloyd, M.D., Edmonia Lynch. Owens Shark, M.D., Ph.D., Douglass Rivers, M.D., Link Snuffer, M.D., Jeanne Ivan, M.D., Serita Sheller, M.B., B.S., Lauris Chroman, M.D., Hilary Hertz. Marti Sleigh, M.D., Macarthur Critchley, M.B., B.S., Ph.D., Chrisandra Carota, M.D., Milly Jakob, M.D., Carey Bullocks. Aris Lot, M.D., Jeanine Luz. Valera Castle, M.D., Mahala Menghini, M.D., Yvonne Kendall, M.D., Harland German, M.D., Ph.D., Laury Deep, M.D., Evelena Leyden, M.D., Assunta Gambles, M.D., Esau Grew, M.D., Wyatt Mage, M.D., Joyce Gross, M.D., Olena Mater, M.D., Minda Ditto, M.D., William Dalton, M.D., Stark Klein, Ph.D., F.R.C.Path., Carmelina Peal, M.D., Neal Dy, M.D., Gentry Fitz, M.D., Vida Rigger. Sherrian Divers, M.D., Lenoard Aden, Ph.D., Earnestine Mealing, M.D., Oretha Caprice, D.[place]., Lowry Ram, M.D., and Raford Pitcher, M.B., Ch.B., Ph.D., for the [employer]* [facility] Med [id number] 17, 2014DOI: [id number]  The chemotherapy consists of Ibrutinib, a Bruton's tyrosine kinase inhibitor. We discussed the role of chemotherapy. The intent is for palliative.   Ibrutinib is taken as a single agent oral treatment indefinitely until unexpected side-effects or lack of response.  In this multicenter, open-label, phase 3 study, we randomly assigned 391 patients with relapsed or refractory CLL or SLL to receive daily ibrutinib or the anti-CD20 antibody ofatumumab. The primary end point was the duration of progression-free survival, with the duration of overall survival and the overall response rate as secondary end points.  At a  median follow-up of 9.4 months, ibrutinib significantly improved progression-free survival; the median duration was not reached in the ibrutinib group (with a rate of progression-free survival of 88% at 6 months), as compared with a median of 8.1 months in the ofatumumab group (hazard ratio for progression or death in the ibrutinib group, 0.22; P<0.001). Ibrutinib also significantly improved overall survival (hazard ratio for death, 0.43; P=0.005). At 12 months, the overall survival rate was 90% in the ibrutinib group and 81% in the ofatumumab group. The overall response rate was significantly higher in the ibrutinib group than in the ofatumumab group (42.6% vs. 4.1%, P<0.001). An additional 20% of ibrutinib-treated patients had a partial response with lymphocytosis. Similar effects were observed regardless of whether patients had a chromosome 17p13.1 deletion or resistance to purine analogues.   The most frequent nonhematologic adverse events were diarrhea, fatigue, pyrexia, and nausea in the ibrutinib group and fatigue, infusion-related reactions, and cough in the ofatumumab group.  Some of the short term side-effects included, though not limited to, risk of fatigue, weight loss, tumor lysis syndrome, risk of allergic reactions, pancytopenia, bruising, diarrhea, transient leukocytosis, life-threatening infections, need for transfusions of blood products, irregular heart beat, nausea, vomiting, change in bowel habits, admission to hospital for various reasons, and risks of death.   The patient is aware that the response rates discussed earlier is not guaranteed.    After a long discussion, patient made an informed decision to proceed with the prescribed plan of care.   I will get baseline EKG She will come back for weekly blood draw as soon as she start her treatment Due to her age and her low body weight, I will start her at reduced dose 240 mg daily I will get my pharmacist to  help with insurance  prior authorization and approval  We discussed the importance of preventive care and reviewed the vaccination programs. She does not have any prior allergic reactions to influenza vaccination. She agrees to proceed with influenza vaccination today and we will administer it today at the clinic.  I recommend allopurinol for tumor lysis prophylaxis I recommend acyclovir for antimicrobial prophylaxis

## 2019-04-23 NOTE — Telephone Encounter (Signed)
Oral Oncology Patient Advocate Encounter  Prior Authorization for April [name] has been approved.    PA# [id number] Effective dates: 04/23/19 through 07/30/19  Oral Oncology Clinic will continue to follow.   [place] Patient [place] Phone [phone] Fax [phone] 04/23/2019    1:08 PM

## 2019-04-23 NOTE — Assessment & Plan Note (Signed)
She tolerated tramadol poorly due to nausea I recommend trial of antiemetics

## 2019-04-23 NOTE — Telephone Encounter (Signed)
Oral Oncology Patient Advocate Encounter  Was successful in securing patient a $8000 grant from [employer] to provide copayment coverage for Imbruvica. This will keep the out of pocket expense at $0.   I have spoken with the patient..   The billing information is as follows and has been shared with [facility].   Member ID: [id number] Group ID: [id number] RxBin: [id number] Dates of Eligibility: 03/24/19 through 03/22/20  [place] Patient [place] Phone [phone] Fax [phone] 04/23/2019    2:01 PM

## 2019-04-24 MED FILL — ACYCLOVIR 400 MG TABLET: 400 | 30 days supply | Qty: 30 | Fill #0

## 2019-04-24 MED FILL — ALLOPURINOL 300 MG TABS: 300 | 30 days supply | Qty: 30 | Fill #0

## 2019-04-24 MED FILL — IMBRUVICA 280 MG TAB: 280 | 28 days supply | Qty: 28 | Fill #0

## 2019-04-27 ENCOUNTER — Telehealth: Payer: Self-pay | Admitting: Pharmacist

## 2019-04-27 NOTE — Telephone Encounter (Signed)
Oral Oncology Pharmacist Encounter  Received call from patient with some additional questions about ibrutinib initiation.  Patient states she picked up her allopurinol and acyclovir from the pharmacy today.  She would like clarification as to how to take these medications, any side effects to expect, and the reason she is taking them.  Patient informed she could take the allopurinol 300 mg once daily early in the day.  This is to help her body decrease uric acid and help protect her kidneys as we do expect she may experience an increase in uric acid as her white blood cell count decreases after ibrutinib initiation. Patient informed she could take her acyclovir 400 mg early in the day as well.  This is an antiviral medication as her immune system is not functioning as a normal immune system at the moment due to her CLL.  This is another medication that can be with or without food and is currently being used for his VZV prophylaxis. Patient informed that both of these medications are generally very well tolerated.  Patient with some additional questions about ibrutinib, she specifically wanted to know if she will lose her hair, will happen if she decides to stop these medications after she gets started, and expected duration of all 3 new medications. Patient informed there was no risk of alopecia with ibrutinib. Patient informed that since her disease is progressing so rapidly, that she can expect continued disease progression if she discontinues treatment. Patient informed that allopurinol can be discontinued once her white blood cell count comes back within the normal range, and that the acyclovir will likely be continued for quite some time. Ibrutinib is planned to continue until disease progression or unacceptable toxicity.  Patient is agreeable to starting on all 3 of these medications tomorrow, 04/28/2019. She will start off taking her ibrutinib early in the day, but will move it until after  supper if she experiences drowsiness with ibrutinib administration.  Patient informed that she has lots of points of support here at the office and she should not hesitate to reach out if she has any additional questions or concerns as she moves through her treatment.  All questions answered. Patient expressed understanding and appreciation. She knows to call the office with any additional questions or concerns.  Johny Drilling, PharmD, BCPS, BCOP  04/27/2019 11:37 AM [facility] [phone]

## 2019-04-27 NOTE — Telephone Encounter (Signed)
Oral Oncology Patient Advocate Encounter  Confirmed with [place] that April [name] was picked up on 04/24/19 with a $0 copay using grant.   [place] [facility] [place] Phone [phone] Fax [phone] 04/27/2019   8:30 AM

## 2019-04-28 ENCOUNTER — Telehealth: Payer: Self-pay | Admitting: Hematology and Oncology

## 2019-04-28 NOTE — Telephone Encounter (Signed)
Scheduled appt per 9/28 sch message - pt is aware of appt date and time

## 2019-04-29 ENCOUNTER — Telehealth: Payer: Self-pay | Admitting: Hematology and Oncology

## 2019-04-29 NOTE — Telephone Encounter (Signed)
R/s appt from 10/2 to 10/8 per NG - pt aware of appt date and time

## 2019-05-01 ENCOUNTER — Telehealth: Payer: Self-pay | Admitting: *Deleted

## 2019-05-01 ENCOUNTER — Inpatient Hospital Stay: Payer: Medicare Other

## 2019-05-01 ENCOUNTER — Inpatient Hospital Stay: Payer: Medicare Other | Admitting: Hematology and Oncology

## 2019-05-01 MED ORDER — PREDNISONE 20 MG PO TABS: 20.0000 mg | ORAL_TABLET | Freq: Every day | ORAL | 0 refills | Status: DC

## 2019-05-01 MED ORDER — ONDANSETRON HCL 4 MG PO TABS: 4.0000 mg | ORAL_TABLET | Freq: Four times a day (QID) | ORAL | 0 refills | Status: DC | PRN

## 2019-05-01 NOTE — Telephone Encounter (Signed)
Prescriptions sent to the pharmacy. Instructions given and patient verbalizes an understanding. Patient agrees to try this and will contact this office back with any new concerns or questions.

## 2019-05-01 NOTE — Telephone Encounter (Signed)
Patient called with complaints of shoulder pain the past two days that at times she rates a 10/10. She has been taking 1000mg  of Tylenol every 8 hours. She is unable to take the tramadol due to nausea and the antiemetics have not helped with that. She is not nauseous any other time. She has feels that this is bone pain and not muscular pain. She has tried alternative pain relief and it does not relieve the pain for very long.

## 2019-05-01 NOTE — Telephone Encounter (Signed)
I suggest trial of prednisone Please call in 20 mg daily PO x 7 days If she is willing, we can call in zofran ODT 4 mg q 6 hours prn before she attempts to take tramadol

## 2019-05-04 ENCOUNTER — Other Ambulatory Visit: Payer: Self-pay | Admitting: Internal Medicine

## 2019-05-07 ENCOUNTER — Inpatient Hospital Stay: Payer: Medicare Other | Attending: Hematology and Oncology

## 2019-05-07 ENCOUNTER — Telehealth: Payer: Self-pay | Admitting: Hematology and Oncology

## 2019-05-07 ENCOUNTER — Ambulatory Visit: Payer: Medicare Other | Admitting: Hematology and Oncology

## 2019-05-07 ENCOUNTER — Encounter: Payer: Self-pay | Admitting: Gynecology

## 2019-05-07 ENCOUNTER — Inpatient Hospital Stay: Payer: Medicare Other | Admitting: Hematology and Oncology

## 2019-05-07 ENCOUNTER — Other Ambulatory Visit: Payer: Medicare Other

## 2019-05-07 ENCOUNTER — Other Ambulatory Visit: Payer: Self-pay

## 2019-05-07 DIAGNOSIS — Z79899 Other long term (current) drug therapy: Secondary | ICD-10-CM | POA: Insufficient documentation

## 2019-05-07 DIAGNOSIS — C9110 Chronic lymphocytic leukemia of B-cell type not having achieved remission: Secondary | ICD-10-CM | POA: Diagnosis not present

## 2019-05-07 DIAGNOSIS — G893 Neoplasm related pain (acute) (chronic): Secondary | ICD-10-CM | POA: Insufficient documentation

## 2019-05-07 DIAGNOSIS — L989 Disorder of the skin and subcutaneous tissue, unspecified: Secondary | ICD-10-CM | POA: Insufficient documentation

## 2019-05-07 DIAGNOSIS — D696 Thrombocytopenia, unspecified: Secondary | ICD-10-CM

## 2019-05-07 DIAGNOSIS — D61818 Other pancytopenia: Secondary | ICD-10-CM | POA: Insufficient documentation

## 2019-05-07 LAB — CBC WITH DIFFERENTIAL/PLATELET
Abs Immature Granulocytes: 0.22 10*3/uL — ABNORMAL HIGH (ref 0.00–0.07)
Basophils Absolute: 0 10*3/uL (ref 0.0–0.1)
Basophils Relative: 0 %
Eosinophils Absolute: 0 10*3/uL (ref 0.0–0.5)
Eosinophils Relative: 0 %
HCT: 38.3 % (ref 36.0–46.0)
Hemoglobin: 11.5 g/dL — ABNORMAL LOW (ref 12.0–15.0)
Immature Granulocytes: 0 %
Lymphocytes Relative: 97 %
Lymphs Abs: 170.9 10*3/uL — ABNORMAL HIGH (ref 0.7–4.0)
MCH: 32.6 pg (ref 26.0–34.0)
MCHC: 30 g/dL (ref 30.0–36.0)
MCV: 108.5 fL — ABNORMAL HIGH (ref 80.0–100.0)
Monocytes Absolute: 0.9 10*3/uL (ref 0.1–1.0)
Monocytes Relative: 1 %
Neutro Abs: 3.6 10*3/uL (ref 1.7–7.7)
Neutrophils Relative %: 2 %
Platelets: 146 10*3/uL — ABNORMAL LOW (ref 150–400)
RBC: 3.53 MIL/uL — ABNORMAL LOW (ref 3.87–5.11)
RDW: 15.6 % — ABNORMAL HIGH (ref 11.5–15.5)
WBC: 175.7 10*3/uL (ref 4.0–10.5)
nRBC: 0 % (ref 0.0–0.2)

## 2019-05-07 LAB — COMPREHENSIVE METABOLIC PANEL
ALT: 18 U/L (ref 0–44)
AST: 15 U/L (ref 15–41)
Albumin: 4 g/dL (ref 3.5–5.0)
Alkaline Phosphatase: 80 U/L (ref 38–126)
Anion gap: 8 (ref 5–15)
BUN: 14 mg/dL (ref 8–23)
CO2: 27 mmol/L (ref 22–32)
Calcium: 9.5 mg/dL (ref 8.9–10.3)
Chloride: 103 mmol/L (ref 98–111)
Creatinine, Ser: 0.8 mg/dL (ref 0.44–1.00)
GFR calc Af Amer: >60 mL/min (ref >60)
GFR calc non Af Amer: >60 mL/min (ref >60)
Glucose, Bld: 95 mg/dL (ref 70–99)
Potassium: 4 mmol/L (ref 3.5–5.1)
Sodium: 138 mmol/L (ref 135–145)
Total Bilirubin: 1 mg/dL (ref 0.3–1.2)
Total Protein: 6.9 g/dL (ref 6.5–8.1)

## 2019-05-07 LAB — LACTATE DEHYDROGENASE: LDH: 152 U/L (ref 98–192)

## 2019-05-07 LAB — URIC ACID: Uric Acid, Serum: <2 mg/dL — ABNORMAL LOW (ref 2.5–7.1)

## 2019-05-07 NOTE — Telephone Encounter (Signed)
I talk with patient regarding schedule  

## 2019-05-08 ENCOUNTER — Encounter: Payer: Self-pay | Admitting: Hematology and Oncology

## 2019-05-08 NOTE — Assessment & Plan Note (Signed)
So far, she tolerated treatment well Her pain is resolving The lymphocytosis seen is expected side effects of ibrutinib We will monitor closely She has minimum pancytopenia but not symptomatic She will return here for weekly blood count for the next 2 weeks I plan to see her again next month for further follow-up

## 2019-05-08 NOTE — Assessment & Plan Note (Signed)
Her thrombocytopenia has mildly improved while on treatment She is not symptomatic Observe only

## 2019-05-08 NOTE — Assessment & Plan Note (Signed)
Her skin rash is stable Observe closely for now

## 2019-05-08 NOTE — Progress Notes (Signed)
[name] OFFICE PROGRESS NOTE  Patient Care Team: Isaac Bliss, Rayford Halsted, MD as PCP - General (Internal Medicine)  ASSESSMENT & PLAN:  Chronic lymphocytic leukemia So far, she tolerated treatment well Her pain is resolving The lymphocytosis seen is expected side effects of ibrutinib We will monitor closely She has minimum pancytopenia but not symptomatic She will return here for weekly blood count for the next 2 weeks I plan to see her again next month for further follow-up  Thrombocytopenia Her thrombocytopenia has mildly improved while on treatment She is not symptomatic Observe only  Cancer associated pain Her pain is better controlled and she tolerated tramadol well She will continue to take tramadol as needed  Skin lesion Her skin rash is stable Observe closely for now   No orders of the defined types were placed in this encounter.   INTERVAL HISTORY: Please see below for problem oriented charting. She returns for further follow-up She tolerated chemotherapy well She developed some minor yeast infection, likely secondary to prednisone which was since discontinued Her skin lesions are stable She denies recent bleeding Her abdominal pain has improved and she tolerated tramadol well She denies palpitation, diarrhea or bruising or bleeding  SUMMARY OF ONCOLOGIC HISTORY: Oncology History Overview Note  Del 13 q   Chronic lymphocytic leukemia ([place])  03/15/2009 Pathology Results   Case #: [id number]  flow cytometry of peripheral blood comfirmed CLL.   04/25/2017 Pathology Results   FISH panel is positive for deletion 13 q only   04/22/2019 Imaging   Ct scan of the chest, abdomen and pelvis 1. No adenopathy within the chest, abdomen or pelvis. 2. Splenomegaly with scattered low-density lesions measuring up to 1.3 cm. 3. Small subpleural nodule in the left lower lobe and right middle lobe measuring up to 5 mm. No follow-up needed if patient is  low-risk (and has no known or suspected primary neoplasm). Non-contrast chest CT can be considered in 12 months if patient is high-risk. This recommendation follows the consensus statement: Guidelines for Management of Incidental Pulmonary Nodules Detected on CT Images: From the Fleischner Society 2017; Radiology 2017; [id number]. 4. Aortic Atherosclerosis (ICD10-I70.0). Coronary artery atherosclerotic calcifications.     04/23/2019 Cancer Staging   Staging form: Chronic Lymphocytic Leukemia / Small Lymphocytic Lymphoma, AJCC 8th Edition - Clinical stage from 04/23/2019: Modified Rai Stage IV (Modified Rai risk: High, Lymphocytosis: Present, Adenopathy: Absent, Organomegaly: Present, Anemia: Absent, Thrombocytopenia: Present) - Signed by Heath Lark, MD on 04/23/2019   04/28/2019 -  Chemotherapy   The patient had Ibrutinib for chemotherapy treatment.       REVIEW OF SYSTEMS:   Constitutional: Denies fevers, chills or abnormal weight loss Eyes: Denies blurriness of vision Ears, nose, mouth, throat, and face: Denies mucositis or sore throat Respiratory: Denies cough, dyspnea or wheezes Cardiovascular: Denies palpitation, chest discomfort or lower extremity swelling Gastrointestinal:  Denies nausea, heartburn or change in bowel habits Lymphatics: Denies new lymphadenopathy or easy bruising Neurological:Denies numbness, tingling or new weaknesses Behavioral/Psych: Mood is stable, no new changes  All other systems were reviewed with the patient and are negative.  I have reviewed the past medical history, past surgical history, social history and family history with the patient and they are unchanged from previous note.  ALLERGIES:  is allergic to codeine and levofloxacin.  MEDICATIONS:  Current Outpatient Medications  Medication Sig Dispense Refill  . acyclovir (ZOVIRAX) 400 MG tablet Take 1 tablet (400 mg total) by mouth daily. 30 tablet 6  .  allopurinol (ZYLOPRIM) 300 MG tablet Take 1  tablet (300 mg total) by mouth daily. 30 tablet 0  . amLODipine (NORVASC) 2.5 MG tablet TAKE 1 TABLET BY MOUTH EVERY DAY 90 tablet 1  . augmented betamethasone dipropionate (DIPROLENE-AF) [id number] % cream 1 APPLICATION APPLY ON THE SKIN TWICE A DAY APPLY TO AFFECTED AREAS AS NEEDED FLARES  2  . BESIVANCE 0.6 % SUSP 4 drops daily for 2 days.  12  . Bevacizumab (AVASTIN) 100 MG/4ML SOLN Inject into the vein.    . calcium-vitamin D (OSCAL WITH D) 500-200 MG-UNIT tablet Take 1 tablet by mouth 2 (two) times daily.    . Ibrutinib 280 MG TABS Take 280 mg by mouth daily. 30 tablet 11  . ibuprofen (ADVIL) 200 MG tablet Take 200 mg by mouth every 6 (six) hours as needed.      . nystatin cream (MYCOSTATIN) Apply 1 application topically 2 (two) times daily. 30 g 3  . ondansetron (ZOFRAN) 4 MG tablet Take 1 tablet (4 mg total) by mouth every 6 (six) hours as needed for nausea. 30 tablet 0  . prochlorperazine (COMPAZINE) 5 MG tablet Take 1 tablet (5 mg total) by mouth every 6 (six) hours as needed for nausea or vomiting. 30 tablet 1  . terconazole (TERAZOL 7) 0.4 % vaginal cream INSERT ONE APPLICATOR VAGINALLY EVERY 2 WEEKS 45 g 3  . traMADol (ULTRAM) 50 MG tablet Take 50 mg by mouth every 6 (six) hours as needed.    . triamcinolone ointment (KENALOG) 0.5 % Apply 1 application topically 2 (two) times daily. 30 g 0   No current facility-administered medications for this visit.     PHYSICAL EXAMINATION: ECOG PERFORMANCE STATUS: 1 - Symptomatic but completely ambulatory  Vitals:   05/07/19 1143  BP: (!) 158/62  Pulse: 62  Resp: 18  Temp: 98.9 F (37.2 C)  SpO2: 100%   Filed Weights   05/07/19 1143  Weight: 102 lb 9.6 oz (46.5 kg)    GENERAL:alert, no distress and comfortable SKIN: Her skin rash is stable NEURO: alert & oriented x 3 with fluent speech, no focal motor/sensory deficits  LABORATORY DATA:  I have reviewed the data as listed    Component Value Date/Time   NA 138 05/07/2019 1117   NA  141 04/25/2017 1041   K 4.0 05/07/2019 1117   K 4.6 04/25/2017 1041   CL 103 05/07/2019 1117   CL 104 10/27/2012 1209   CO2 27 05/07/2019 1117   CO2 26 04/25/2017 1041   GLUCOSE 95 05/07/2019 1117   GLUCOSE 97 04/25/2017 1041   GLUCOSE 104 (H) 10/27/2012 1209   BUN 14 05/07/2019 1117   BUN 13.1 04/25/2017 1041   CREATININE 0.80 05/07/2019 1117   CREATININE 0.8 04/25/2017 1041   CALCIUM 9.5 05/07/2019 1117   CALCIUM 9.8 04/25/2017 1041   PROT 6.9 05/07/2019 1117   PROT 7.5 04/25/2017 1041   ALBUMIN 4.0 05/07/2019 1117   ALBUMIN 4.4 04/25/2017 1041   AST 15 05/07/2019 1117   AST 23 04/25/2017 1041   ALT 18 05/07/2019 1117   ALT 14 04/25/2017 1041   ALKPHOS 80 05/07/2019 1117   ALKPHOS 69 04/25/2017 1041   BILITOT 1.0 05/07/2019 1117   BILITOT 0.95 04/25/2017 1041   GFRNONAA >60 05/07/2019 1117   GFRAA >60 05/07/2019 1117    No results found for: SPEP, UPEP  Lab Results  Component Value Date   WBC 175.7 (HH) 05/07/2019   NEUTROABS 3.6 05/07/2019  HGB 11.5 (L) 05/07/2019   HCT 38.3 05/07/2019   MCV 108.5 (H) 05/07/2019   PLT 146 (L) 05/07/2019      Chemistry      Component Value Date/Time   NA 138 05/07/2019 1117   NA 141 04/25/2017 1041   K 4.0 05/07/2019 1117   K 4.6 04/25/2017 1041   CL 103 05/07/2019 1117   CL 104 10/27/2012 1209   CO2 27 05/07/2019 1117   CO2 26 04/25/2017 1041   BUN 14 05/07/2019 1117   BUN 13.1 04/25/2017 1041   CREATININE 0.80 05/07/2019 1117   CREATININE 0.8 04/25/2017 1041      Component Value Date/Time   CALCIUM 9.5 05/07/2019 1117   CALCIUM 9.8 04/25/2017 1041   ALKPHOS 80 05/07/2019 1117   ALKPHOS 69 04/25/2017 1041   AST 15 05/07/2019 1117   AST 23 04/25/2017 1041   ALT 18 05/07/2019 1117   ALT 14 04/25/2017 1041   BILITOT 1.0 05/07/2019 1117   BILITOT 0.95 04/25/2017 1041       RADIOGRAPHIC STUDIES: I have personally reviewed the radiological images as listed and agreed with the findings in the report. Ct Chest  W Contrast  Result Date: 04/22/2019 CLINICAL DATA:  Chronic lymphocytic leukemia diagnosed 3/19. EXAM: CT CHEST, ABDOMEN, AND PELVIS WITH CONTRAST TECHNIQUE: Multidetector CT imaging of the chest, abdomen and pelvis was performed following the standard protocol during bolus administration of intravenous contrast. CONTRAST:  30mL OMNIPAQUE IOHEXOL 300 MG/ML  SOLN COMPARISON:  None FINDINGS: CT CHEST FINDINGS Cardiovascular: Normal heart size. No pericardial effusion. Mild aortic atherosclerosis. Lad coronary artery calcification. Mediastinum/Nodes: Heterogeneous appearance of the thyroid gland. The trachea appears patent and is midline. Normal appearance of the esophagus. No enlarged supraclavicular, mediastinal or hilar lymph nodes. No axillary adenopathy. Lungs/Pleura: No pleural effusion. Scarring identified within the anterior left lower lobe. Subpleural nodule in the lateral left lower lobe measures 5 mm, image 87/4. 3 mm subpleural nodule in the right middle lobe noted, image 85/4. 2 mm right upper lobe lung nodule is identified, image 66/4. Musculoskeletal: Scoliosis. No acute or suspicious osseous findings. CT ABDOMEN PELVIS FINDINGS Hepatobiliary: Anterior dome of liver cyst measures approximately 8 mm. Gallbladder unremarkable. No biliary dilatation. Pancreas: Unremarkable. No pancreatic ductal dilatation or surrounding inflammatory changes. Spleen: The spleen measures 10.2 x 5.7 by 13.1 cm (volume = 400 cm^3). Scattered low density foci throughout the splenic parenchyma are identified which measure up to 1.3 cm. Adrenals/Urinary Tract: Normal appearance of the adrenal glands. Bilateral parapelvic cysts identified. No kidney mass or hydronephrosis. There is moderate distension of the urinary bladder. No focal bladder abnormality. Stomach/Bowel: The stomach is normal. The small bowel loops have a normal course and caliber. No abnormal bowel wall thickening, inflammation or distension. Vascular/Lymphatic:  Aortic atherosclerosis. Retroperitoneal lymph nodes within the upper abdomen measure up to 9 mm. No adenopathy. Reproductive: Status post hysterectomy. No adnexal masses. Other: No abdominal wall hernia or abnormality. No abdominopelvic ascites. Musculoskeletal: Scoliosis and degenerative disc disease. No acute or significant osseous abnormality. IMPRESSION: 1. No adenopathy within the chest, abdomen or pelvis. 2. Splenomegaly with scattered low-density lesions measuring up to 1.3 cm. 3. Small subpleural nodule in the left lower lobe and right middle lobe measuring up to 5 mm. No follow-up needed if patient is low-risk (and has no known or suspected primary neoplasm). Non-contrast chest CT can be considered in 12 months if patient is high-risk. This recommendation follows the consensus statement: Guidelines for Management of Incidental Pulmonary  Nodules Detected on CT Images: From the Fleischner Society 2017; Radiology 2017; [id number]. 4. Aortic Atherosclerosis (ICD10-I70.0). Coronary artery atherosclerotic calcifications. Electronically Signed   By: Kerby Moors M.D.   On: 04/22/2019 14:32   Ct Abdomen Pelvis W Contrast  Result Date: 04/22/2019 CLINICAL DATA:  Chronic lymphocytic leukemia diagnosed 3/19. EXAM: CT CHEST, ABDOMEN, AND PELVIS WITH CONTRAST TECHNIQUE: Multidetector CT imaging of the chest, abdomen and pelvis was performed following the standard protocol during bolus administration of intravenous contrast. CONTRAST:  66mL OMNIPAQUE IOHEXOL 300 MG/ML  SOLN COMPARISON:  None FINDINGS: CT CHEST FINDINGS Cardiovascular: Normal heart size. No pericardial effusion. Mild aortic atherosclerosis. Lad coronary artery calcification. Mediastinum/Nodes: Heterogeneous appearance of the thyroid gland. The trachea appears patent and is midline. Normal appearance of the esophagus. No enlarged supraclavicular, mediastinal or hilar lymph nodes. No axillary adenopathy. Lungs/Pleura: No pleural effusion. Scarring  identified within the anterior left lower lobe. Subpleural nodule in the lateral left lower lobe measures 5 mm, image 87/4. 3 mm subpleural nodule in the right middle lobe noted, image 85/4. 2 mm right upper lobe lung nodule is identified, image 66/4. Musculoskeletal: Scoliosis. No acute or suspicious osseous findings. CT ABDOMEN PELVIS FINDINGS Hepatobiliary: Anterior dome of liver cyst measures approximately 8 mm. Gallbladder unremarkable. No biliary dilatation. Pancreas: Unremarkable. No pancreatic ductal dilatation or surrounding inflammatory changes. Spleen: The spleen measures 10.2 x 5.7 by 13.1 cm (volume = 400 cm^3). Scattered low density foci throughout the splenic parenchyma are identified which measure up to 1.3 cm. Adrenals/Urinary Tract: Normal appearance of the adrenal glands. Bilateral parapelvic cysts identified. No kidney mass or hydronephrosis. There is moderate distension of the urinary bladder. No focal bladder abnormality. Stomach/Bowel: The stomach is normal. The small bowel loops have a normal course and caliber. No abnormal bowel wall thickening, inflammation or distension. Vascular/Lymphatic: Aortic atherosclerosis. Retroperitoneal lymph nodes within the upper abdomen measure up to 9 mm. No adenopathy. Reproductive: Status post hysterectomy. No adnexal masses. Other: No abdominal wall hernia or abnormality. No abdominopelvic ascites. Musculoskeletal: Scoliosis and degenerative disc disease. No acute or significant osseous abnormality. IMPRESSION: 1. No adenopathy within the chest, abdomen or pelvis. 2. Splenomegaly with scattered low-density lesions measuring up to 1.3 cm. 3. Small subpleural nodule in the left lower lobe and right middle lobe measuring up to 5 mm. No follow-up needed if patient is low-risk (and has no known or suspected primary neoplasm). Non-contrast chest CT can be considered in 12 months if patient is high-risk. This recommendation follows the consensus statement:  Guidelines for Management of Incidental Pulmonary Nodules Detected on CT Images: From the Fleischner Society 2017; Radiology 2017; [id number]. 4. Aortic Atherosclerosis (ICD10-I70.0). Coronary artery atherosclerotic calcifications. Electronically Signed   By: Kerby Moors M.D.   On: 04/22/2019 14:32    All questions were answered. The patient knows to call the clinic with any problems, questions or concerns. No barriers to learning was detected.  I spent 15 minutes counseling the patient face to face. The total time spent in the appointment was 20 minutes and more than 50% was on counseling and review of test results  Heath Lark, MD 05/08/2019 7:53 AM

## 2019-05-08 NOTE — Assessment & Plan Note (Signed)
Her pain is better controlled and she tolerated tramadol well She will continue to take tramadol as needed

## 2019-05-13 ENCOUNTER — Other Ambulatory Visit: Payer: Self-pay

## 2019-05-13 ENCOUNTER — Telehealth: Payer: Self-pay

## 2019-05-13 MED ORDER — PREDNISONE 20 MG PO TABS: 20.0000 mg | ORAL_TABLET | Freq: Every day | ORAL | 0 refills | Status: DC

## 2019-05-13 NOTE — Telephone Encounter (Signed)
She can take 2 tramadol plus tylenol or we can prescribe 20 mg prednisone daily for 7 days Please let me know what she wants Also, I can see her Friday morning to discuss; you can cancel labs if she is planning to stop the treatment

## 2019-05-13 NOTE — Telephone Encounter (Signed)
Called and given below message. She verbalized understanding. She would like Rx for Prednisone. Rx sent to pharmacy. She will continue with Tramadol as prescribed and take tylenol prn. She would like to keep lab appt until she sees Dr. Alvy Bimler.  Appt scheduled for tomorrow to see Dr. Alvy Bimler with labs prior to MD appt.

## 2019-05-13 NOTE — Telephone Encounter (Signed)
She called and left a message to call her.  Called back. She started having pain on the left side of her abdomen yesterday in the spleen area and neck muscle pain. She feels like she cannot hold her head up, the muscles hurt so bad. She is taking the Tramadol for pain and it is not helping. She wants to stop all the medications because she feels so bad. She uses [facility] on [address].

## 2019-05-14 ENCOUNTER — Other Ambulatory Visit: Payer: Medicare Other

## 2019-05-14 ENCOUNTER — Other Ambulatory Visit: Payer: Self-pay

## 2019-05-14 ENCOUNTER — Encounter: Payer: Self-pay | Admitting: Hematology and Oncology

## 2019-05-14 ENCOUNTER — Inpatient Hospital Stay: Payer: Medicare Other

## 2019-05-14 ENCOUNTER — Inpatient Hospital Stay (HOSPITAL_COMMUNITY)
Admission: AD | Admit: 2019-05-14 | Discharge: 2019-05-17 | DRG: 948 | Disposition: A | Discharge status: Home / Self Care | Payer: Medicare Other | Source: Ambulatory Visit | Attending: Internal Medicine | Admitting: Internal Medicine

## 2019-05-14 ENCOUNTER — Telehealth: Payer: Self-pay | Admitting: Emergency Medicine

## 2019-05-14 VITALS — BP 138/68 | HR 85 | Temp 98.3°F | Resp 18 | Ht 60.0 in | Wt 102.0 lb

## 2019-05-14 DIAGNOSIS — Z9221 Personal history of antineoplastic chemotherapy: Secondary | ICD-10-CM

## 2019-05-14 DIAGNOSIS — E860 Dehydration: Secondary | ICD-10-CM | POA: Diagnosis not present

## 2019-05-14 DIAGNOSIS — R627 Adult failure to thrive: Secondary | ICD-10-CM | POA: Diagnosis not present

## 2019-05-14 DIAGNOSIS — Z9079 Acquired absence of other genital organ(s): Secondary | ICD-10-CM

## 2019-05-14 DIAGNOSIS — Z87891 Personal history of nicotine dependence: Secondary | ICD-10-CM

## 2019-05-14 DIAGNOSIS — Z20828 Contact with and (suspected) exposure to other viral communicable diseases: Secondary | ICD-10-CM | POA: Diagnosis present

## 2019-05-14 DIAGNOSIS — Z9013 Acquired absence of bilateral breasts and nipples: Secondary | ICD-10-CM

## 2019-05-14 DIAGNOSIS — C9110 Chronic lymphocytic leukemia of B-cell type not having achieved remission: Secondary | ICD-10-CM

## 2019-05-14 DIAGNOSIS — R161 Splenomegaly, not elsewhere classified: Secondary | ICD-10-CM | POA: Diagnosis present

## 2019-05-14 DIAGNOSIS — Z881 Allergy status to other antibiotic agents status: Secondary | ICD-10-CM

## 2019-05-14 DIAGNOSIS — Z807 Family history of other malignant neoplasms of lymphoid, hematopoietic and related tissues: Secondary | ICD-10-CM

## 2019-05-14 DIAGNOSIS — Z79891 Long term (current) use of opiate analgesic: Secondary | ICD-10-CM

## 2019-05-14 DIAGNOSIS — R748 Abnormal levels of other serum enzymes: Secondary | ICD-10-CM | POA: Diagnosis not present

## 2019-05-14 DIAGNOSIS — Z806 Family history of leukemia: Secondary | ICD-10-CM

## 2019-05-14 DIAGNOSIS — Z90710 Acquired absence of both cervix and uterus: Secondary | ICD-10-CM

## 2019-05-14 DIAGNOSIS — D696 Thrombocytopenia, unspecified: Secondary | ICD-10-CM | POA: Diagnosis present

## 2019-05-14 DIAGNOSIS — Z9049 Acquired absence of other specified parts of digestive tract: Secondary | ICD-10-CM

## 2019-05-14 DIAGNOSIS — Z681 Body mass index (BMI) 19 or less, adult: Secondary | ICD-10-CM

## 2019-05-14 DIAGNOSIS — J42 Unspecified chronic bronchitis: Secondary | ICD-10-CM | POA: Diagnosis present

## 2019-05-14 DIAGNOSIS — Z7189 Other specified counseling: Secondary | ICD-10-CM | POA: Diagnosis not present

## 2019-05-14 DIAGNOSIS — Z8572 Personal history of non-Hodgkin lymphomas: Secondary | ICD-10-CM

## 2019-05-14 DIAGNOSIS — D638 Anemia in other chronic diseases classified elsewhere: Secondary | ICD-10-CM | POA: Diagnosis present

## 2019-05-14 DIAGNOSIS — Z79899 Other long term (current) drug therapy: Secondary | ICD-10-CM

## 2019-05-14 DIAGNOSIS — G893 Neoplasm related pain (acute) (chronic): Principal | ICD-10-CM | POA: Diagnosis present

## 2019-05-14 DIAGNOSIS — Z853 Personal history of malignant neoplasm of breast: Secondary | ICD-10-CM

## 2019-05-14 DIAGNOSIS — M810 Age-related osteoporosis without current pathological fracture: Secondary | ICD-10-CM | POA: Diagnosis present

## 2019-05-14 DIAGNOSIS — Z885 Allergy status to narcotic agent status: Secondary | ICD-10-CM

## 2019-05-14 DIAGNOSIS — Z8249 Family history of ischemic heart disease and other diseases of the circulatory system: Secondary | ICD-10-CM

## 2019-05-14 DIAGNOSIS — I10 Essential (primary) hypertension: Secondary | ICD-10-CM | POA: Diagnosis present

## 2019-05-14 DIAGNOSIS — Z90722 Acquired absence of ovaries, bilateral: Secondary | ICD-10-CM

## 2019-05-14 LAB — COMPREHENSIVE METABOLIC PANEL
ALT: 74 U/L — ABNORMAL HIGH (ref 0–44)
AST: 45 U/L — ABNORMAL HIGH (ref 15–41)
Albumin: 3.8 g/dL (ref 3.5–5.0)
Alkaline Phosphatase: 133 U/L — ABNORMAL HIGH (ref 38–126)
Anion gap: 13 (ref 5–15)
BUN: 13 mg/dL (ref 8–23)
CO2: 25 mmol/L (ref 22–32)
Calcium: 10.7 mg/dL — ABNORMAL HIGH (ref 8.9–10.3)
Chloride: 97 mmol/L — ABNORMAL LOW (ref 98–111)
Creatinine, Ser: 0.72 mg/dL (ref 0.44–1.00)
GFR calc Af Amer: >60 mL/min (ref >60)
GFR calc non Af Amer: >60 mL/min (ref >60)
Glucose, Bld: 134 mg/dL — ABNORMAL HIGH (ref 70–99)
Potassium: 4.1 mmol/L (ref 3.5–5.1)
Sodium: 135 mmol/L (ref 135–145)
Total Bilirubin: 1.8 mg/dL — ABNORMAL HIGH (ref 0.3–1.2)
Total Protein: 7.5 g/dL (ref 6.5–8.1)

## 2019-05-14 LAB — CBC WITH DIFFERENTIAL/PLATELET
Abs Immature Granulocytes: 0.55 10*3/uL — ABNORMAL HIGH (ref 0.00–0.07)
Basophils Absolute: 0 10*3/uL (ref 0.0–0.1)
Basophils Relative: 0 %
Eosinophils Absolute: 0 10*3/uL (ref 0.0–0.5)
Eosinophils Relative: 0 %
HCT: 36.4 % (ref 36.0–46.0)
Hemoglobin: 11.2 g/dL — ABNORMAL LOW (ref 12.0–15.0)
Immature Granulocytes: 0 %
Lymphocytes Relative: 92 %
Lymphs Abs: 152.5 10*3/uL — ABNORMAL HIGH (ref 0.7–4.0)
MCH: 33 pg (ref 26.0–34.0)
MCHC: 30.8 g/dL (ref 30.0–36.0)
MCV: 107.4 fL — ABNORMAL HIGH (ref 80.0–100.0)
Monocytes Absolute: 1.4 10*3/uL — ABNORMAL HIGH (ref 0.1–1.0)
Monocytes Relative: 1 %
Neutro Abs: 11.7 10*3/uL — ABNORMAL HIGH (ref 1.7–7.7)
Neutrophils Relative %: 7 %
Platelets: 158 10*3/uL (ref 150–400)
RBC: 3.39 MIL/uL — ABNORMAL LOW (ref 3.87–5.11)
RDW: 16.4 % — ABNORMAL HIGH (ref 11.5–15.5)
WBC: 166.2 10*3/uL (ref 4.0–10.5)
nRBC: 0 % (ref 0.0–0.2)

## 2019-05-14 LAB — LACTATE DEHYDROGENASE: LDH: 219 U/L — ABNORMAL HIGH (ref 98–192)

## 2019-05-14 LAB — URIC ACID: Uric Acid, Serum: 2.6 mg/dL (ref 2.5–7.1)

## 2019-05-14 MED ORDER — SODIUM CHLORIDE 0.9 % IV SOLN
INTRAVENOUS | Status: DC
  Administered 2019-05-14 – 2019-05-15 (×2): via INTRAVENOUS

## 2019-05-14 MED ORDER — ACETAMINOPHEN 325 MG PO TABS: 650.0000 mg | ORAL_TABLET | Freq: Four times a day (QID) | ORAL | Status: DC | PRN

## 2019-05-14 MED ORDER — SODIUM CHLORIDE 0.9% FLUSH: 3.0000 mL | Freq: Two times a day (BID) | INTRAVENOUS | Status: DC

## 2019-05-14 MED ORDER — PREDNISONE 20 MG PO TABS
20.0000 mg | ORAL_TABLET | Freq: Every day | ORAL | Status: DC
  Administered 2019-05-14 – 2019-05-17 (×4): 20 mg via ORAL
  Filled 2019-05-14 (×4): qty 1

## 2019-05-14 MED ORDER — ENOXAPARIN SODIUM 40 MG/0.4ML ~~LOC~~ SOLN
40.0000 mg | SUBCUTANEOUS | Status: DC
  Administered 2019-05-14 – 2019-05-16 (×3): 40 mg via SUBCUTANEOUS
  Filled 2019-05-14 (×3): qty 0.4

## 2019-05-14 MED ORDER — ONDANSETRON HCL 4 MG PO TABS: 4.0000 mg | ORAL_TABLET | Freq: Four times a day (QID) | ORAL | Status: DC | PRN

## 2019-05-14 MED ORDER — OXYCODONE-ACETAMINOPHEN 5-325 MG PO TABS
1.0000 | ORAL_TABLET | Freq: Once | ORAL | Status: AC
  Administered 2019-05-14: 1 via ORAL

## 2019-05-14 MED ORDER — SODIUM CHLORIDE 0.9% FLUSH
3.0000 mL | Freq: Two times a day (BID) | INTRAVENOUS | Status: DC
  Administered 2019-05-14 – 2019-05-16 (×4): 3 mL via INTRAVENOUS

## 2019-05-14 MED ORDER — SODIUM CHLORIDE 0.9% FLUSH: 3.0000 mL | INTRAVENOUS | Status: DC | PRN

## 2019-05-14 MED ORDER — OXYCODONE HCL 5 MG PO TABS
5.0000 mg | ORAL_TABLET | ORAL | Status: DC | PRN
  Administered 2019-05-15 – 2019-05-17 (×8): 5 mg via ORAL
  Filled 2019-05-14 (×8): qty 1

## 2019-05-14 MED ORDER — AMLODIPINE BESYLATE 5 MG PO TABS
2.5000 mg | ORAL_TABLET | Freq: Every day | ORAL | Status: DC
  Administered 2019-05-15 – 2019-05-17 (×3): 2.5 mg via ORAL
  Filled 2019-05-14 (×3): qty 1

## 2019-05-14 MED ORDER — POLYETHYLENE GLYCOL 3350 17 G PO PACK
17.0000 g | PACK | Freq: Every day | ORAL | Status: DC
  Administered 2019-05-14 – 2019-05-16 (×3): 17 g via ORAL
  Filled 2019-05-14 (×4): qty 1

## 2019-05-14 MED ORDER — ALLOPURINOL 300 MG PO TABS
300.0000 mg | ORAL_TABLET | Freq: Every day | ORAL | Status: DC
  Administered 2019-05-15 – 2019-05-16 (×2): 300 mg via ORAL
  Filled 2019-05-14 (×3): qty 1

## 2019-05-14 MED ORDER — SODIUM CHLORIDE 0.9 % IV SOLN: 250.0000 mL | INTRAVENOUS | Status: DC | PRN

## 2019-05-14 MED ORDER — MORPHINE SULFATE (PF) 2 MG/ML IV SOLN
2.0000 mg | INTRAVENOUS | Status: DC | PRN
  Administered 2019-05-14 – 2019-05-16 (×5): 2 mg via INTRAVENOUS
  Filled 2019-05-14 (×5): qty 1

## 2019-05-14 MED ORDER — ACETAMINOPHEN 650 MG RE SUPP: 650.0000 mg | Freq: Four times a day (QID) | RECTAL | Status: DC | PRN

## 2019-05-14 MED ORDER — OXYCODONE-ACETAMINOPHEN 5-325 MG PO TABS
ORAL_TABLET | ORAL | Status: AC
  Filled 2019-05-14: qty 1

## 2019-05-14 MED ORDER — SENNOSIDES-DOCUSATE SODIUM 8.6-50 MG PO TABS: 1.0000 | ORAL_TABLET | Freq: Every evening | ORAL | Status: DC | PRN

## 2019-05-14 MED ORDER — ONDANSETRON HCL 4 MG/2ML IJ SOLN
4.0000 mg | Freq: Four times a day (QID) | INTRAMUSCULAR | Status: DC | PRN
  Administered 2019-05-14: 4 mg via INTRAVENOUS
  Filled 2019-05-14 (×2): qty 2

## 2019-05-14 MED ORDER — ACYCLOVIR 400 MG PO TABS
400.0000 mg | ORAL_TABLET | Freq: Every day | ORAL | Status: DC
  Administered 2019-05-15 – 2019-05-16 (×2): 400 mg via ORAL
  Filled 2019-05-14 (×3): qty 1

## 2019-05-14 MED ORDER — ALBUTEROL SULFATE (2.5 MG/3ML) 0.083% IN NEBU: 2.5000 mg | INHALATION_SOLUTION | RESPIRATORY_TRACT | Status: DC | PRN

## 2019-05-14 NOTE — Assessment & Plan Note (Signed)
She has severe, uncontrolled pain related to her splenomegaly The patient tolerated oral narcotics poorly in the past She took 2 tramadol each time over the past 24 hours without success of controlling her pain I recommend admission to the hospital for pain management If she continues to tolerate IV narcotic poorly, I recommend consultation with palliative care team for pain management

## 2019-05-14 NOTE — Telephone Encounter (Signed)
Phone report given to [facility] for pt being transferred to 6th floor, room 1603.  Pt transported via wc to room with belongings.

## 2019-05-14 NOTE — Progress Notes (Addendum)
Pt transferred to [facility] while waiting for admission/bed placement.  Report received in person from [facility].  Pt has eaten and drank during waiting period, declines needing any food/drink or any pain medication in [facility].  Pt spoke with husband over the phone who is aware of admission plan.  Pt will receive next clean bed per bed placement operator, pt aware.

## 2019-05-14 NOTE — H&P (Addendum)
History and Physical  April [name] [id number] DOB: 04/05/1935 DOA: 05/14/2019   Patient coming from: Home & is able to ambulate  Chief Complaint: Cancer pain management  HPI: April [name] is a 84 y.o. female with medical history significant for breast CA, chronic bronchitis, CLL, presents as a direct admit from cancer center by Dr. Alvy Bimler, requesting admission for pain management and failure to thrive.  Patient was recently started on treatment for her CLL, currently not tolerating treatment, having anorexia, significant abdominal pain, uncontrolled with p.o. meds.  Patient is currently also dehydrated with poor oral intake.  Oncologist requesting admission for pain management, hydration and failure to thrive.  Currently reports left upper quadrant pain likely secondary to splenomegaly, profound weakness from inability to tolerate orally.  Patient denies any chest pain, shortness of breath, nausea/vomiting, fever/chills, diarrhea.,  Patient was started on ibrutinib on 04/28/2019.  Review of Systems: Review of systems are otherwise negative   Past Medical History:  Diagnosis Date  . Breast CA ([place])   . Bronchitis, chronic ([place])   . Eye hemorrhage, left   . Leukemia, chronic lymphoid ([place])   . 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 Osteoporosis 05/2018   T score -3.1 overall stable from prior study   Past Surgical History:  Procedure Laterality Date  . ABDOMINAL HYSTERECTOMY  1980  . BREAST IMPLANTS REMOVED  2001  . BREAST SURGERY     Bilateral mastectomy  . MASTECTOMY  1982   BILATERAL  . OOPHORECTOMY     BSO  . PARTIAL COLECTOMY     INTESTINAL ABSCESS WITH SALPINGECTOMY  . RECONSTRUCTION LEFT ELBOW    . TONSILLECTOMY    . UMBILLICAL HERNIA REPAIR      Social History:  reports that she quit smoking about 64 years ago. Her smoking use included cigarettes. She has never used smokeless tobacco. She reports current alcohol use of about 5.0  standard drinks of alcohol per week. She reports that she does not use drugs.   Allergies  Allergen Reactions  . Codeine Nausea Only  . Levofloxacin Rash    Family History  Problem Relation Age of Onset  . Cancer Brother        HODGKINS  . Cancer Brother        leukemia  . Parkinson's disease Brother   . Heart disease Mother   . Heart disease Sister       Prior to Admission medications   Medication Sig Start Date End Date Taking? Authorizing Provider  acyclovir (ZOVIRAX) 400 MG tablet Take 1 tablet (400 mg total) by mouth daily. 04/23/19   Heath Lark, MD  allopurinol (ZYLOPRIM) 300 MG tablet Take 1 tablet (300 mg total) by mouth daily. 04/23/19   Heath Lark, MD  amLODipine (NORVASC) 2.5 MG tablet TAKE 1 TABLET BY MOUTH EVERY DAY 05/05/19   Isaac Bliss, Rayford Halsted, MD  augmented betamethasone dipropionate (DIPROLENE-AF) [id number] % cream 1 APPLICATION APPLY ON THE SKIN TWICE A DAY APPLY TO AFFECTED AREAS AS NEEDED FLARES 05/08/18   [provider]  BESIVANCE 0.6 % SUSP 4 drops daily for 2 days. 11/07/16   [provider]  Bevacizumab (AVASTIN) 100 MG/4ML SOLN Inject into the vein.    [provider]  calcium-vitamin D (OSCAL WITH D) 500-200 MG-UNIT tablet Take 1 tablet by mouth 2 (two) times daily.    [provider]  ibuprofen (ADVIL) 200 MG tablet Take 200 mg by mouth every 6 (  six) hours as needed.      [provider]  nystatin cream (MYCOSTATIN) Apply 1 application topically 2 (two) times daily. 04/09/17   Fontaine, Belinda Block, MD  ondansetron (ZOFRAN) 4 MG tablet Take 1 tablet (4 mg total) by mouth every 6 (six) hours as needed for nausea. 05/01/19   Heath Lark, MD  predniSONE (DELTASONE) 20 MG tablet Take 1 tablet (20 mg total) by mouth daily with breakfast for 7 days. 05/13/19 05/20/19  Heath Lark, MD  prochlorperazine (COMPAZINE) 5 MG tablet Take 1 tablet (5 mg total) by mouth every 6 (six) hours as needed for nausea or vomiting.  04/23/19   Heath Lark, MD  terconazole (TERAZOL 7) 0.4 % vaginal cream INSERT ONE APPLICATOR VAGINALLY EVERY 2 WEEKS 04/09/19   Fontaine, Belinda Block, MD  traMADol (ULTRAM) 50 MG tablet Take 50 mg by mouth every 6 (six) hours as needed.    [provider]  triamcinolone ointment (KENALOG) 0.5 % Apply 1 application topically 2 (two) times daily. 05/06/17   Heath Lark, MD    Physical Exam: BP (!) 172/84 (BP Location: Right Arm)   Pulse 87   Temp 97.7 F (36.5 C) (Oral)   Resp 17   LMP 03/26/1979   SpO2 98%   General: NAD, chronically ill appearing, frail Eyes: Normal ENT: Normal Neck: Supple Cardiovascular: S1, S2 present Respiratory: CTA B Abdomen: Soft, tenderness at the left upper quadrant, nondistended, bowel sounds present Skin: Rash noted in lower extremity Musculoskeletal: No pedal edema noted Psychiatric: Normal mood Neurologic: No focal neurologic deficit noted         Labs on Admission:  Basic Metabolic Panel: Recent Labs  Lab 05/14/19 1044  NA 135  K 4.1  CL 97*  CO2 25  GLUCOSE 134*  BUN 13  CREATININE 0.72  CALCIUM 10.7*   Liver Function Tests: Recent Labs  Lab 05/14/19 1044  AST 45*  ALT 74*  ALKPHOS 133*  BILITOT 1.8*  PROT 7.5  ALBUMIN 3.8   No results for input(s): LIPASE, AMYLASE in the last 168 hours. No results for input(s): AMMONIA in the last 168 hours. CBC: Recent Labs  Lab 05/14/19 1044  WBC 166.2*  NEUTROABS 11.7*  HGB 11.2*  HCT 36.4  MCV 107.4*  PLT 158   Cardiac Enzymes: No results for input(s): CKTOTAL, CKMB, CKMBINDEX, TROPONINI in the last 168 hours.  BNP (last 3 results) No results for input(s): BNP in the last 8760 hours.  ProBNP (last 3 results) No results for input(s): PROBNP in the last 8760 hours.  CBG: No results for input(s): GLUCAP in the last 168 hours.  Radiological Exams on Admission: No results found.  EKG: Pending  Assessment/Plan Present on Admission: . Failure to thrive in adult .  Chronic lymphocytic leukemia ([place]) . Essential hypertension . Cancer associated pain . Dehydration  Principal Problem:   Failure to thrive in adult Active Problems:   Chronic lymphocytic leukemia ([facility])   Essential hypertension   Cancer associated pain   Dehydration  Intractable cancer associated pain Severe pain worsened by splenomegaly Unable to tolerate p.o., with p.o. meds ineffective for pain control Start IV morphine, as well as p.o. oxycodone Bowel regimen May need consultation to palliative team for pain management if unable to tolerate IV narcotics  Failure to thrive/dehydration Likely 2/2 poorly tolerating chemotherapy/poor oral intake IV fluids Dietitian consult  Chronic lymphocytic leukemia Noted with significantly elevated leukocytes/lymphocytosis, currently improving from previous Poorly tolerating treatment, started ibrutinib on 04/28/2019 Oncology  Dr. Alvy Bimler on board, recommend short-term prednisone therapy to treat her CLL as well as help with her pain from splenomegaly Oncology will follow Start prednisone 20 mg daily as recommended  Anemia chronic disease Likely due to above Daily CBC  Abnormal liver enzymes Likely due to side effects of ibrutinib Monitor closely  HTN Uncontrolled likely 2/2 pain Continue home amlodipine    DVT prophylaxis: Lovenox  Code Status: Full  Family Communication: None at bedside  Disposition Plan: To be determined  Consults called: Oncology  Admission status: Observation    Alma Friendly MD [facility]   If 7PM-7AM, please contact night-coverage [email or web address]   05/14/2019, 7:38 PM

## 2019-05-14 NOTE — Assessment & Plan Note (Signed)
She tolerated treatment poorly Her uncontrolled pain is not related to side effects of ibrutinib Lymphocytosis and thrombocytopenia improving The patient was adamant that she does not want to go on chemotherapy I recommend short-term prednisone therapy to treat her CLL and that would also help with her pain and splenomegaly

## 2019-05-14 NOTE — Assessment & Plan Note (Signed)
This is due to side effects of treatment Observe only for now

## 2019-05-14 NOTE — Assessment & Plan Note (Signed)
She appears dehydrated Recommend IV fluids while hospitalized

## 2019-05-14 NOTE — Assessment & Plan Note (Signed)
I have goals of care discussion with the patient The patient appreciated to be admitted for pain management Hopefully, she will be in the hospital for short-term Once she is able to tolerate oral medications, she can be discharged I will make follow-up appointment to see her back in the near future for further discussion about treatment options

## 2019-05-14 NOTE — Progress Notes (Signed)
[facility] OFFICE PROGRESS NOTE  Patient Care Team: Isaac Bliss, Rayford Halsted, MD as PCP - General (Internal Medicine)  ASSESSMENT & PLAN:  Chronic lymphocytic leukemia She tolerated treatment poorly Her uncontrolled pain is not related to side effects of ibrutinib Lymphocytosis and thrombocytopenia improving The patient was adamant that she does not want to go on chemotherapy I recommend short-term prednisone therapy to treat her CLL and that would also help with her pain and splenomegaly  Cancer associated pain She has severe, uncontrolled pain related to her splenomegaly The patient tolerated oral narcotics poorly in the past She took 2 tramadol each time over the past 24 hours without success of controlling her pain I recommend admission to the hospital for pain management If she continues to tolerate IV narcotic poorly, I recommend consultation with palliative care team for pain management  Goals of care, counseling/discussion I have goals of care discussion with the patient The patient appreciated to be admitted for pain management Hopefully, she will be in the hospital for short-term Once she is able to tolerate oral medications, she can be discharged I will make follow-up appointment to see her back in the near future for further discussion about treatment options  Abnormal liver enzymes This is due to side effects of treatment Observe only for now  Dehydration She appears dehydrated Recommend IV fluids while hospitalized   No orders of the defined types were placed in this encounter.   INTERVAL HISTORY: Please see below for problem oriented charting. She is seen urgently due to uncontrolled pain Over the last 24 to 48 hours, she have severe, left upper quadrant pain despite taking tramadol at 100 mg each time She resolved to not moving much over the last day due to severe pain When she does not move, the pain is stable She is afraid to fall due  to profound weakness from not eating or drinking much She denies recent infection, fever or chills  SUMMARY OF ONCOLOGIC HISTORY: Oncology History Overview Note  Del 13 q   Chronic lymphocytic leukemia ([place])  03/15/2009 Pathology Results   Case #: [id number]  flow cytometry of peripheral blood comfirmed CLL.   04/25/2017 Pathology Results   FISH panel is positive for deletion 13 q only   04/22/2019 Imaging   Ct scan of the chest, abdomen and pelvis 1. No adenopathy within the chest, abdomen or pelvis. 2. Splenomegaly with scattered low-density lesions measuring up to 1.3 cm. 3. Small subpleural nodule in the left lower lobe and right middle lobe measuring up to 5 mm. No follow-up needed if patient is low-risk (and has no known or suspected primary neoplasm). Non-contrast chest CT can be considered in 12 months if patient is high-risk. This recommendation follows the consensus statement: Guidelines for Management of Incidental Pulmonary Nodules Detected on CT Images: From the Fleischner Society 2017; Radiology 2017; [id number]. 4. Aortic Atherosclerosis (ICD10-I70.0). Coronary artery atherosclerotic calcifications.     04/23/2019 Cancer Staging   Staging form: Chronic Lymphocytic Leukemia / Small Lymphocytic Lymphoma, AJCC 8th Edition - Clinical stage from 04/23/2019: Modified Rai Stage IV (Modified Rai risk: High, Lymphocytosis: Present, Adenopathy: Absent, Organomegaly: Present, Anemia: Absent, Thrombocytopenia: Present) - Signed by Heath Lark, MD on 04/23/2019   04/28/2019 -  Chemotherapy   The patient had Ibrutinib for chemotherapy treatment.       REVIEW OF SYSTEMS:   Constitutional: Denies fevers, chills or abnormal weight loss Eyes: Denies blurriness of vision Ears, nose, mouth, throat, and face: Denies mucositis  or sore throat Respiratory: Denies cough, dyspnea or wheezes Cardiovascular: Denies palpitation, chest discomfort or lower extremity swelling Gastrointestinal:  Denies  nausea, heartburn or change in bowel habits Skin: Denies abnormal skin rashes Lymphatics: Denies new lymphadenopathy or easy bruising Behavioral/Psych: Mood is stable, no new changes  All other systems were reviewed with the patient and are negative.  I have reviewed the past medical history, past surgical history, social history and family history with the patient and they are unchanged from previous note.  ALLERGIES:  is allergic to codeine and levofloxacin.  MEDICATIONS:  Current Outpatient Medications  Medication Sig Dispense Refill  . acyclovir (ZOVIRAX) 400 MG tablet Take 1 tablet (400 mg total) by mouth daily. 30 tablet 6  . allopurinol (ZYLOPRIM) 300 MG tablet Take 1 tablet (300 mg total) by mouth daily. 30 tablet 0  . amLODipine (NORVASC) 2.5 MG tablet TAKE 1 TABLET BY MOUTH EVERY DAY 90 tablet 1  . augmented betamethasone dipropionate (DIPROLENE-AF) [id number] % cream 1 APPLICATION APPLY ON THE SKIN TWICE A DAY APPLY TO AFFECTED AREAS AS NEEDED FLARES  2  . BESIVANCE 0.6 % SUSP 4 drops daily for 2 days.  12  . Bevacizumab (AVASTIN) 100 MG/4ML SOLN Inject into the vein.    . calcium-vitamin D (OSCAL WITH D) 500-200 MG-UNIT tablet Take 1 tablet by mouth 2 (two) times daily.    Marland Kitchen ibuprofen (ADVIL) 200 MG tablet Take 200 mg by mouth every 6 (six) hours as needed.      . nystatin cream (MYCOSTATIN) Apply 1 application topically 2 (two) times daily. 30 g 3  . ondansetron (ZOFRAN) 4 MG tablet Take 1 tablet (4 mg total) by mouth every 6 (six) hours as needed for nausea. 30 tablet 0  . predniSONE (DELTASONE) 20 MG tablet Take 1 tablet (20 mg total) by mouth daily with breakfast for 7 days. 7 tablet 0  . prochlorperazine (COMPAZINE) 5 MG tablet Take 1 tablet (5 mg total) by mouth every 6 (six) hours as needed for nausea or vomiting. 30 tablet 1  . terconazole (TERAZOL 7) 0.4 % vaginal cream INSERT ONE APPLICATOR VAGINALLY EVERY 2 WEEKS 45 g 3  . traMADol (ULTRAM) 50 MG tablet Take 50 mg by mouth  every 6 (six) hours as needed.    . triamcinolone ointment (KENALOG) 0.5 % Apply 1 application topically 2 (two) times daily. 30 g 0   No current facility-administered medications for this visit.     PHYSICAL EXAMINATION: ECOG PERFORMANCE STATUS: 3 - Symptomatic, >50% confined to bed  Vitals:   05/14/19 1106  BP: (!) 149/73  Pulse: (!) 101  Resp: 18  Temp: 99.1 F (37.3 C)  SpO2: 97%   Filed Weights   05/14/19 1106  Weight: 102 lb (46.3 kg)    GENERAL:alert, frail, sitting on the wheelchair.  She appears dehydrated Limited examination due to severe pain SKIN: skin color, texture, turgor are normal, no rashes or significant lesions EYES: normal, Conjunctiva are pink and non-injected, sclera clear OROPHARYNX:no exudate, no erythema and lips, buccal mucosa, and tongue normal  NECK: supple, thyroid normal size, non-tender, without nodularity LYMPH:  no palpable lymphadenopathy in the cervical, axillary or inguinal LUNGS: clear to auscultation and percussion with normal breathing effort HEART: regular rate & rhythm and no murmurs and no lower extremity edema ABDOMEN:abdomen soft, severe pain on the left upper quadrant Musculoskeletal:no cyanosis of digits and no clubbing  NEURO: alert & oriented x 3 with fluent speech, no focal motor/sensory deficits  LABORATORY DATA:  I have reviewed the data as listed    Component Value Date/Time   NA 135 05/14/2019 1044   NA 141 04/25/2017 1041   K 4.1 05/14/2019 1044   K 4.6 04/25/2017 1041   CL 97 (L) 05/14/2019 1044   CL 104 10/27/2012 1209   CO2 25 05/14/2019 1044   CO2 26 04/25/2017 1041   GLUCOSE 134 (H) 05/14/2019 1044   GLUCOSE 97 04/25/2017 1041   GLUCOSE 104 (H) 10/27/2012 1209   BUN 13 05/14/2019 1044   BUN 13.1 04/25/2017 1041   CREATININE 0.72 05/14/2019 1044   CREATININE 0.8 04/25/2017 1041   CALCIUM 10.7 (H) 05/14/2019 1044   CALCIUM 9.8 04/25/2017 1041   PROT 7.5 05/14/2019 1044   PROT 7.5 04/25/2017 1041    ALBUMIN 3.8 05/14/2019 1044   ALBUMIN 4.4 04/25/2017 1041   AST 45 (H) 05/14/2019 1044   AST 23 04/25/2017 1041   ALT 74 (H) 05/14/2019 1044   ALT 14 04/25/2017 1041   ALKPHOS 133 (H) 05/14/2019 1044   ALKPHOS 69 04/25/2017 1041   BILITOT 1.8 (H) 05/14/2019 1044   BILITOT 0.95 04/25/2017 1041   GFRNONAA >60 05/14/2019 1044   GFRAA >60 05/14/2019 1044    No results found for: SPEP, UPEP  Lab Results  Component Value Date   WBC 166.2 (HH) 05/14/2019   NEUTROABS 11.7 (H) 05/14/2019   HGB 11.2 (L) 05/14/2019   HCT 36.4 05/14/2019   MCV 107.4 (H) 05/14/2019   PLT 158 05/14/2019      Chemistry      Component Value Date/Time   NA 135 05/14/2019 1044   NA 141 04/25/2017 1041   K 4.1 05/14/2019 1044   K 4.6 04/25/2017 1041   CL 97 (L) 05/14/2019 1044   CL 104 10/27/2012 1209   CO2 25 05/14/2019 1044   CO2 26 04/25/2017 1041   BUN 13 05/14/2019 1044   BUN 13.1 04/25/2017 1041   CREATININE 0.72 05/14/2019 1044   CREATININE 0.8 04/25/2017 1041      Component Value Date/Time   CALCIUM 10.7 (H) 05/14/2019 1044   CALCIUM 9.8 04/25/2017 1041   ALKPHOS 133 (H) 05/14/2019 1044   ALKPHOS 69 04/25/2017 1041   AST 45 (H) 05/14/2019 1044   AST 23 04/25/2017 1041   ALT 74 (H) 05/14/2019 1044   ALT 14 04/25/2017 1041   BILITOT 1.8 (H) 05/14/2019 1044   BILITOT 0.95 04/25/2017 1041       RADIOGRAPHIC STUDIES: I have personally reviewed the radiological images as listed and agreed with the findings in the report. Ct Chest W Contrast  Result Date: 04/22/2019 CLINICAL DATA:  Chronic lymphocytic leukemia diagnosed 3/19. EXAM: CT CHEST, ABDOMEN, AND PELVIS WITH CONTRAST TECHNIQUE: Multidetector CT imaging of the chest, abdomen and pelvis was performed following the standard protocol during bolus administration of intravenous contrast. CONTRAST:  63mL OMNIPAQUE IOHEXOL 300 MG/ML  SOLN COMPARISON:  None FINDINGS: CT CHEST FINDINGS Cardiovascular: Normal heart size. No pericardial  effusion. Mild aortic atherosclerosis. Lad coronary artery calcification. Mediastinum/Nodes: Heterogeneous appearance of the thyroid gland. The trachea appears patent and is midline. Normal appearance of the esophagus. No enlarged supraclavicular, mediastinal or hilar lymph nodes. No axillary adenopathy. Lungs/Pleura: No pleural effusion. Scarring identified within the anterior left lower lobe. Subpleural nodule in the lateral left lower lobe measures 5 mm, image 87/4. 3 mm subpleural nodule in the right middle lobe noted, image 85/4. 2 mm right upper lobe lung nodule is identified, image  66/4. Musculoskeletal: Scoliosis. No acute or suspicious osseous findings. CT ABDOMEN PELVIS FINDINGS Hepatobiliary: Anterior dome of liver cyst measures approximately 8 mm. Gallbladder unremarkable. No biliary dilatation. Pancreas: Unremarkable. No pancreatic ductal dilatation or surrounding inflammatory changes. Spleen: The spleen measures 10.2 x 5.7 by 13.1 cm (volume = 400 cm^3). Scattered low density foci throughout the splenic parenchyma are identified which measure up to 1.3 cm. Adrenals/Urinary Tract: Normal appearance of the adrenal glands. Bilateral parapelvic cysts identified. No kidney mass or hydronephrosis. There is moderate distension of the urinary bladder. No focal bladder abnormality. Stomach/Bowel: The stomach is normal. The small bowel loops have a normal course and caliber. No abnormal bowel wall thickening, inflammation or distension. Vascular/Lymphatic: Aortic atherosclerosis. Retroperitoneal lymph nodes within the upper abdomen measure up to 9 mm. No adenopathy. Reproductive: Status post hysterectomy. No adnexal masses. Other: No abdominal wall hernia or abnormality. No abdominopelvic ascites. Musculoskeletal: Scoliosis and degenerative disc disease. No acute or significant osseous abnormality. IMPRESSION: 1. No adenopathy within the chest, abdomen or pelvis. 2. Splenomegaly with scattered low-density  lesions measuring up to 1.3 cm. 3. Small subpleural nodule in the left lower lobe and right middle lobe measuring up to 5 mm. No follow-up needed if patient is low-risk (and has no known or suspected primary neoplasm). Non-contrast chest CT can be considered in 12 months if patient is high-risk. This recommendation follows the consensus statement: Guidelines for Management of Incidental Pulmonary Nodules Detected on CT Images: From the Fleischner Society 2017; Radiology 2017; 284:228-243. 4. Aortic Atherosclerosis (ICD10-I70.0). Coronary artery atherosclerotic calcifications. Electronically Signed   By: Kerby Moors M.D.   On: 04/22/2019 14:32   Ct Abdomen Pelvis W Contrast  Result Date: 04/22/2019 CLINICAL DATA:  Chronic lymphocytic leukemia diagnosed 3/19. EXAM: CT CHEST, ABDOMEN, AND PELVIS WITH CONTRAST TECHNIQUE: Multidetector CT imaging of the chest, abdomen and pelvis was performed following the standard protocol during bolus administration of intravenous contrast. CONTRAST:  49mL OMNIPAQUE IOHEXOL 300 MG/ML  SOLN COMPARISON:  None FINDINGS: CT CHEST FINDINGS Cardiovascular: Normal heart size. No pericardial effusion. Mild aortic atherosclerosis. Lad coronary artery calcification. Mediastinum/Nodes: Heterogeneous appearance of the thyroid gland. The trachea appears patent and is midline. Normal appearance of the esophagus. No enlarged supraclavicular, mediastinal or hilar lymph nodes. No axillary adenopathy. Lungs/Pleura: No pleural effusion. Scarring identified within the anterior left lower lobe. Subpleural nodule in the lateral left lower lobe measures 5 mm, image 87/4. 3 mm subpleural nodule in the right middle lobe noted, image 85/4. 2 mm right upper lobe lung nodule is identified, image 66/4. Musculoskeletal: Scoliosis. No acute or suspicious osseous findings. CT ABDOMEN PELVIS FINDINGS Hepatobiliary: Anterior dome of liver cyst measures approximately 8 mm. Gallbladder unremarkable. No biliary  dilatation. Pancreas: Unremarkable. No pancreatic ductal dilatation or surrounding inflammatory changes. Spleen: The spleen measures 10.2 x 5.7 by 13.1 cm (volume = 400 cm^3). Scattered low density foci throughout the splenic parenchyma are identified which measure up to 1.3 cm. Adrenals/Urinary Tract: Normal appearance of the adrenal glands. Bilateral parapelvic cysts identified. No kidney mass or hydronephrosis. There is moderate distension of the urinary bladder. No focal bladder abnormality. Stomach/Bowel: The stomach is normal. The small bowel loops have a normal course and caliber. No abnormal bowel wall thickening, inflammation or distension. Vascular/Lymphatic: Aortic atherosclerosis. Retroperitoneal lymph nodes within the upper abdomen measure up to 9 mm. No adenopathy. Reproductive: Status post hysterectomy. No adnexal masses. Other: No abdominal wall hernia or abnormality. No abdominopelvic ascites. Musculoskeletal: Scoliosis and degenerative disc disease. No acute  or significant osseous abnormality. IMPRESSION: 1. No adenopathy within the chest, abdomen or pelvis. 2. Splenomegaly with scattered low-density lesions measuring up to 1.3 cm. 3. Small subpleural nodule in the left lower lobe and right middle lobe measuring up to 5 mm. No follow-up needed if patient is low-risk (and has no known or suspected primary neoplasm). Non-contrast chest CT can be considered in 12 months if patient is high-risk. This recommendation follows the consensus statement: Guidelines for Management of Incidental Pulmonary Nodules Detected on CT Images: From the Fleischner Society 2017; Radiology 2017; 284:228-243. 4. Aortic Atherosclerosis (ICD10-I70.0). Coronary artery atherosclerotic calcifications. Electronically Signed   By: Kerby Moors M.D.   On: 04/22/2019 14:32    All questions were answered. The patient knows to call the clinic with any problems, questions or concerns. No barriers to learning was detected.  I  spent 30 minutes counseling the patient face to face. The total time spent in the appointment was 40 minutes and more than 50% was on counseling and review of test results  Heath Lark, MD 05/14/2019 11:52 AM

## 2019-05-15 ENCOUNTER — Encounter (HOSPITAL_COMMUNITY): Payer: Self-pay | Admitting: *Deleted

## 2019-05-15 DIAGNOSIS — E860 Dehydration: Secondary | ICD-10-CM | POA: Diagnosis not present

## 2019-05-15 DIAGNOSIS — R627 Adult failure to thrive: Secondary | ICD-10-CM | POA: Diagnosis not present

## 2019-05-15 DIAGNOSIS — C9110 Chronic lymphocytic leukemia of B-cell type not having achieved remission: Secondary | ICD-10-CM | POA: Diagnosis not present

## 2019-05-15 DIAGNOSIS — G893 Neoplasm related pain (acute) (chronic): Secondary | ICD-10-CM | POA: Diagnosis not present

## 2019-05-15 LAB — COMPREHENSIVE METABOLIC PANEL
ALT: 52 U/L — ABNORMAL HIGH (ref 0–44)
AST: 26 U/L (ref 15–41)
Albumin: 3.1 g/dL — ABNORMAL LOW (ref 3.5–5.0)
Alkaline Phosphatase: 95 U/L (ref 38–126)
Anion gap: 10 (ref 5–15)
BUN: 13 mg/dL (ref 8–23)
CO2: 24 mmol/L (ref 22–32)
Calcium: 9.3 mg/dL (ref 8.9–10.3)
Chloride: 103 mmol/L (ref 98–111)
Creatinine, Ser: 0.61 mg/dL (ref 0.44–1.00)
GFR calc Af Amer: >60 mL/min (ref >60)
GFR calc non Af Amer: >60 mL/min (ref >60)
Glucose, Bld: 135 mg/dL — ABNORMAL HIGH (ref 70–99)
Potassium: 4.5 mmol/L (ref 3.5–5.1)
Sodium: 137 mmol/L (ref 135–145)
Total Bilirubin: 1 mg/dL (ref 0.3–1.2)
Total Protein: 6.3 g/dL — ABNORMAL LOW (ref 6.5–8.1)

## 2019-05-15 LAB — CBC
HCT: 33.6 % — ABNORMAL LOW (ref 36.0–46.0)
Hemoglobin: 10 g/dL — ABNORMAL LOW (ref 12.0–15.0)
MCH: 33 pg (ref 26.0–34.0)
MCHC: 29.8 g/dL — ABNORMAL LOW (ref 30.0–36.0)
MCV: 110.9 fL — ABNORMAL HIGH (ref 80.0–100.0)
Platelets: 141 10*3/uL — ABNORMAL LOW (ref 150–400)
RBC: 3.03 MIL/uL — ABNORMAL LOW (ref 3.87–5.11)
RDW: 15.9 % — ABNORMAL HIGH (ref 11.5–15.5)
WBC: 135 10*3/uL (ref 4.0–10.5)
nRBC: 0 % (ref 0.0–0.2)

## 2019-05-15 LAB — SARS CORONAVIRUS 2 (TAT 6-24 HRS): SARS Coronavirus 2: NEGATIVE

## 2019-05-15 MED ORDER — BOOST / RESOURCE BREEZE PO LIQD CUSTOM
1.0000 | Freq: Three times a day (TID) | ORAL | Status: DC
  Administered 2019-05-15 – 2019-05-16 (×3): 1 via ORAL

## 2019-05-15 NOTE — Progress Notes (Signed)
PROGRESS NOTE  LORALI HUCKEBA [id number] DOB: 04/20/1935 DOA: 05/14/2019 PCP: Isaac Bliss, Rayford Halsted, MD  HPI/Recap of past 24 hours: CLAUDEEN ARMBRECHT is a 84 y.o. female with medical history significant for breast CA, chronic bronchitis, CLL, presents as a direct admit from cancer center by Dr. Alvy Bimler, requesting admission for pain management and failure to thrive.  Patient was recently started on treatment for her CLL, currently not tolerating treatment, having anorexia, significant abdominal pain, uncontrolled with p.o. meds.  Patient is currently also dehydrated with poor oral intake.  Oncologist requesting admission for pain management, hydration and failure to thrive.  Currently reports left upper quadrant pain likely secondary to splenomegaly, profound weakness from inability to tolerate orally.  Patient denies any chest pain, shortness of breath, nausea/vomiting, fever/chills, diarrhea.,  Patient was started on ibrutinib on 04/28/2019.    Today, patient still complains of left upper quadrant pain, but somewhat better with narcotics.  Still requiring IV morphine for pain control.  Patient able to tolerate orally, able to keep some food down.  Denies any chest pain, shortness of breath, diarrhea, fever/chills.  Assessment/Plan: Principal Problem:   Failure to thrive in adult Active Problems:   Chronic lymphocytic leukemia (HCC)   Essential hypertension   Cancer associated pain   Dehydration  Intractable cancer associated pain Severe pain worsened by splenomegaly Still requiring IV narcotics Bowel regimen  Failure to thrive/dehydration Likely 2/2 poorly tolerating chemotherapy P.o. intake improving gradually We will DC IV fluids, to prevent overload Dietitian consult  Chronic lymphocytic leukemia Noted with significantly elevated leukocytes/lymphocytosis, currently improving from previous Poorly tolerating treatment, started ibrutinib on 04/28/2019 Oncology Dr.  Alvy Bimler on board, recommend short-term prednisone therapy to treat her CLL as well as help with her pain from splenomegaly Oncology will follow Continue prednisone 20 mg daily as recommended  Anemia chronic disease/macrocytic anemia Likely due to above Anemia panel pending Daily CBC  Abnormal liver enzymes Likely due to side effects of ibrutinib Monitor closely  HTN Uncontrolled likely 2/2 pain Continue home amlodipine          Malnutrition Type:      Malnutrition Characteristics:      Nutrition Interventions:       Estimated body mass index is 19.92 kg/m as calculated from the following:   Height as of an earlier encounter on 05/14/19: 5' (1.524 m).   Weight as of an earlier encounter on 05/14/19: 46.3 kg.     Code Status: Full  Family Communication: None at bedside  Disposition Plan: Likely home   Consultants:  Oncology, Dr. Alvy Bimler  Procedures: None  Antimicrobials:  None  DVT prophylaxis: Enoxaparin   Objective: Vitals:   05/14/19 1626 05/14/19 2034 05/15/19 0527  BP: (!) 172/84 136/78 (!) 141/66  Pulse: 87 82 77  Resp: 17 20 16   Temp: 97.7 F (36.5 C) 98.9 F (37.2 C) 98.6 F (37 C)  TempSrc: Oral Oral Oral  SpO2: 98% 93% 94%    Intake/Output Summary (Last 24 hours) at 05/15/2019 1410 Last data filed at 05/15/2019 0537 Gross per 24 hour  Intake -  Output 1350 ml  Net -1350 ml   There were no vitals filed for this visit.  Exam:  General: NAD, chronically ill-appearing, frail  Cardiovascular: S1, S2 present  Respiratory: CTAB  Abdomen: Soft, tender LUQ, nondistended, bowel sounds present  Musculoskeletal: No bilateral pedal edema noted  Skin:  Varicose veins noted, chronic rash noted on the left lower extremity  Psychiatry: Normal mood  Data Reviewed: CBC: Recent Labs  Lab 05/14/19 1044 05/15/19 0515  WBC 166.2* 135.0*  NEUTROABS 11.7*  --   HGB 11.2* 10.0*  HCT 36.4 33.6*  MCV 107.4*  110.9*  PLT 158 [id number]*   Basic Metabolic Panel: Recent Labs  Lab 05/14/19 1044 05/15/19 0515  NA 135 137  K 4.1 4.5  CL 97* 103  CO2 25 24  GLUCOSE 134* 135*  BUN 13 13  CREATININE 0.72 0.61  CALCIUM 10.7* 9.3   GFR: Estimated Creatinine Clearance: 39.6 mL/min (by C-G formula based on SCr of 0.61 mg/dL). Liver Function Tests: Recent Labs  Lab 05/14/19 1044 05/15/19 0515  AST 45* 26  ALT 74* 52*  ALKPHOS 133* 95  BILITOT 1.8* 1.0  PROT 7.5 6.3*  ALBUMIN 3.8 3.1*   No results for input(s): LIPASE, AMYLASE in the last 168 hours. No results for input(s): AMMONIA in the last 168 hours. Coagulation Profile: No results for input(s): INR, PROTIME in the last 168 hours. Cardiac Enzymes: No results for input(s): CKTOTAL, CKMB, CKMBINDEX, TROPONINI in the last 168 hours. BNP (last 3 results) No results for input(s): PROBNP in the last 8760 hours. HbA1C: No results for input(s): HGBA1C in the last 72 hours. CBG: No results for input(s): GLUCAP in the last 168 hours. Lipid Profile: No results for input(s): CHOL, HDL, LDLCALC, TRIG, CHOLHDL, LDLDIRECT in the last 72 hours. Thyroid Function Tests: No results for input(s): TSH, T4TOTAL, FREET4, T3FREE, THYROIDAB in the last 72 hours. Anemia Panel: No results for input(s): VITAMINB12, FOLATE, FERRITIN, TIBC, IRON, RETICCTPCT in the last 72 hours. Urine analysis:    Component Value Date/Time   COLORURINE YELLOW 04/01/2015 [place] 04/01/2015 1347   LABSPEC 1.009 04/01/2015 1347   PHURINE 6.5 04/01/2015 1347   GLUCOSEU NEGATIVE 04/01/2015 1347   HGBUR NEGATIVE 04/01/2015 1347   BILIRUBINUR n 01/23/2016 0927   KETONESUR NEGATIVE 04/01/2015 1347   PROTEINUR n 01/23/2016 0927   PROTEINUR NEGATIVE 04/01/2015 1347   UROBILINOGEN 0.2 01/23/2016 0927   UROBILINOGEN 0.2 03/30/2014 1203   NITRITE n 01/23/2016 0927   NITRITE NEGATIVE 04/01/2015 1347   LEUKOCYTESUR Negative 01/23/2016 0927   Sepsis Labs:  @[email or web address] (procalcitonin:4,lacticidven:4)  ) Recent Results (from the past 240 hour(s))  SARS CORONAVIRUS 2 (TAT 6-24 HRS) Nasopharyngeal Nasopharyngeal Swab     Status: None   Collection Time: 05/14/19  7:00 PM   Specimen: Nasopharyngeal Swab  Result Value Ref Range Status   SARS Coronavirus 2 NEGATIVE NEGATIVE Final    Comment: (NOTE) SARS-CoV-2 target nucleic acids are NOT DETECTED. The SARS-CoV-2 RNA is generally detectable in upper and lower respiratory specimens during the acute phase of infection. Negative results do not preclude SARS-CoV-2 infection, do not rule out co-infections with other pathogens, and should not be used as the sole basis for treatment or other patient management decisions. Negative results must be combined with clinical observations, patient history, and epidemiological information. The expected result is Negative. Fact Sheet for Patients: SugarRoll.be Fact Sheet for Healthcare Providers: https://www.woods-mathews.com/ This test is not yet approved or cleared by the [place] FDA and  has been authorized for detection and/or diagnosis of SARS-CoV-2 by FDA under an Emergency Use Authorization (EUA). This EUA will remain  in effect (meaning this test can be used) for the duration of the COVID-19 declaration under Section 56 4(b)(1) of the Act, 21 U.S.C. section 360bbb-3(b)(1), unless the authorization is terminated or revoked sooner. Performed at [facility], [place] [address]  [place], [place] [address]       Studies: No results found.  Scheduled Meds: . acyclovir  400 mg Oral Daily  . allopurinol  300 mg Oral Daily  . amLODipine  2.5 mg Oral Daily  . enoxaparin (LOVENOX) injection  40 mg Subcutaneous Q24H  . polyethylene glycol  17 g Oral Daily  . predniSONE  20 mg Oral Q breakfast  . sodium chloride flush  3 mL Intravenous Q12H  . sodium chloride flush  3 mL Intravenous Q12H    Continuous  Infusions: . sodium chloride       LOS: 1 day     Alma Friendly, MD [facility]  If 7PM-7AM, please contact night-coverage [email or web address] 05/15/2019, 2:10 PM

## 2019-05-15 NOTE — Progress Notes (Addendum)
[place] OFFICE PROGRESS NOTE  Patient Care Team: Isaac Bliss, Rayford Halsted, MD as PCP - General (Internal Medicine) I have seen the patient, examined her and agree with assessment and plan ASSESSMENT & PLAN:  Chronic lymphocytic leukemia She tolerated treatment poorly Her uncontrolled pain is not related to side effects of ibrutinib Lymphocytosis and thrombocytopenia improving Today, she felt better She is willing to try chemotherapy again For now, I recommend daily prednisone I recommend her to hold treatment until I call her next week  Cancer associated pain She has severe, uncontrolled pain related to her splenomegaly The patient tolerated oral narcotics poorly in the past She took 2 tramadol each time over the past 24 hours without success of controlling her pain She is now hospitalized for pain management Taking oxycodone without significant side effects however pain not adequately controlled unless she receives IV morphine as well Recommend continued titration of oxycodone to control pain If pain inadequately controlled, recommend palliative care team consult for pain management  Goals of care, counseling/discussion I have goals of care discussion with the patient The patient appreciated to be admitted for pain management Hopefully, she will be in the hospital for short-term  Once she is able to tolerate oral medications, she can be discharged I will make follow-up appointment to see her back in the near future for further discussion about treatment options  Abnormal liver enzymes This is due to side effects of treatment This has significantly improved with only a mild elevation of her ALT this morning Observe only for now  Dehydration Appeared dehydrated prior to admission now receiving IV fluids This is improving and will defer management IV fluids to hospitalist    INTERVAL HISTORY: The patient reports that her pain seems better this  morning Reports that pain is primarily in the right side of her neck and in her abdomen Has been receiving oxycodone and is not reporting any nausea or vomiting associated with this Pain is not adequately controlled with oxycodone alone and she is requiring IV morphine as well Reports that she feels sleepy and has some difficulty remembering things at times Denies fevers and chills Denies chest pain and shortness of breath  SUMMARY OF ONCOLOGIC HISTORY: Oncology History Overview Note  Del 13 q   Chronic lymphocytic leukemia ([place])  03/15/2009 Pathology Results   Case #: [id number]  flow cytometry of peripheral blood comfirmed CLL.   04/25/2017 Pathology Results   FISH panel is positive for deletion 13 q only   04/22/2019 Imaging   Ct scan of the chest, abdomen and pelvis 1. No adenopathy within the chest, abdomen or pelvis. 2. Splenomegaly with scattered low-density lesions measuring up to 1.3 cm. 3. Small subpleural nodule in the left lower lobe and right middle lobe measuring up to 5 mm. No follow-up needed if patient is low-risk (and has no known or suspected primary neoplasm). Non-contrast chest CT can be considered in 12 months if patient is high-risk. This recommendation follows the consensus statement: Guidelines for Management of Incidental Pulmonary Nodules Detected on CT Images: From the Fleischner Society 2017; Radiology 2017; 284:228-243. 4. Aortic Atherosclerosis (ICD10-I70.0). Coronary artery atherosclerotic calcifications.     04/23/2019 Cancer Staging   Staging form: Chronic Lymphocytic Leukemia / Small Lymphocytic Lymphoma, AJCC 8th Edition - Clinical stage from 04/23/2019: Modified Rai Stage IV (Modified Rai risk: High, Lymphocytosis: Present, Adenopathy: Absent, Organomegaly: Present, Anemia: Absent, Thrombocytopenia: Present) - Signed by Heath Lark, MD on 04/23/2019   04/28/2019 -  Chemotherapy  The patient had Ibrutinib for chemotherapy treatment.       REVIEW OF  SYSTEMS:   Constitutional: Denies fevers, chills or abnormal weight loss Eyes: Denies blurriness of vision Ears, nose, mouth, throat, and face: Denies mucositis or sore throat Respiratory: Denies cough, dyspnea or wheezes Cardiovascular: Denies palpitation, chest discomfort or lower extremity swelling Gastrointestinal:  Denies nausea, heartburn or change in bowel habits Skin: Denies abnormal skin rashes Lymphatics: Denies new lymphadenopathy or easy bruising Behavioral/Psych: Mood is stable, no new changes  All other systems were reviewed with the patient and are negative.  I have reviewed the past medical history, past surgical history, social history and family history with the patient and they are unchanged from previous note.  ALLERGIES:  is allergic to codeine and levofloxacin.  MEDICATIONS:  Current Facility-Administered Medications  Medication Dose Route Frequency Provider Last Rate Last Dose  . 0.9 %  sodium chloride infusion   Intravenous Continuous Alma Friendly, MD 100 mL/hr at 05/15/19 0349    . 0.9 %  sodium chloride infusion  250 mL Intravenous PRN Alma Friendly, MD      . acetaminophen (TYLENOL) tablet 650 mg  650 mg Oral Q6H PRN Alma Friendly, MD       Or  . acetaminophen (TYLENOL) suppository 650 mg  650 mg Rectal Q6H PRN Alma Friendly, MD      . acyclovir (ZOVIRAX) tablet 400 mg  400 mg Oral Daily Alma Friendly, MD   400 mg at 05/15/19 0854  . albuterol (PROVENTIL) (2.5 MG/3ML) 0.083% nebulizer solution 2.5 mg  2.5 mg Nebulization Q2H PRN Alma Friendly, MD      . allopurinol (ZYLOPRIM) tablet 300 mg  300 mg Oral Daily Alma Friendly, MD   300 mg at 05/15/19 0854  . amLODipine (NORVASC) tablet 2.5 mg  2.5 mg Oral Daily Alma Friendly, MD   2.5 mg at 05/15/19 0854  . enoxaparin (LOVENOX) injection 40 mg  40 mg Subcutaneous Q24H Alma Friendly, MD   40 mg at 05/14/19 2059  . morphine 2 MG/ML injection 2 mg  2 mg  Intravenous Q4H PRN Alma Friendly, MD   2 mg at 05/15/19 0348  . ondansetron (ZOFRAN) tablet 4 mg  4 mg Oral Q6H PRN Alma Friendly, MD       Or  . ondansetron [facility]) injection 4 mg  4 mg Intravenous Q6H PRN Alma Friendly, MD   4 mg at 05/14/19 1738  . oxyCODONE (Oxy IR/ROXICODONE) immediate release tablet 5 mg  5 mg Oral Q4H PRN Alma Friendly, MD   5 mg at 05/15/19 0216  . polyethylene glycol (MIRALAX / GLYCOLAX) packet 17 g  17 g Oral Daily Alma Friendly, MD   17 g at 05/15/19 0854  . predniSONE (DELTASONE) tablet 20 mg  20 mg Oral Q breakfast Alma Friendly, MD   20 mg at 05/15/19 0854  . senna-docusate (Senokot-S) tablet 1 tablet  1 tablet Oral QHS PRN Alma Friendly, MD      . sodium chloride flush (NS) 0.9 % injection 3 mL  3 mL Intravenous Q12H Alma Friendly, MD      . sodium chloride flush (NS) 0.9 % injection 3 mL  3 mL Intravenous Q12H Alma Friendly, MD   3 mL at 05/14/19 2102  . sodium chloride flush (NS) 0.9 % injection 3 mL  3 mL Intravenous PRN Alma Friendly, MD  PHYSICAL EXAMINATION: ECOG PERFORMANCE STATUS: 3 - Symptomatic, >50% confined to bed  Vitals:   05/14/19 2034 05/15/19 0527  BP: 136/78 (!) 141/66  Pulse: 82 77  Resp: 20 16  Temp: 98.9 F (37.2 C) 98.6 F (37 C)  SpO2: 93% 94%   There were no vitals filed for this visit.  GENERAL:alert, frail, laying in the hospital bed.   SKIN: skin color, texture, turgor are normal, no rashes or significant lesions EYES: normal, Conjunctiva are pink and non-injected, sclera clear OROPHARYNX:no exudate, no erythema and lips, buccal mucosa, and tongue normal  NECK: supple, thyroid normal size, non-tender, without nodularity LYMPH:  no palpable lymphadenopathy in the cervical, axillary or inguinal LUNGS: clear to auscultation and percussion with normal breathing effort HEART: regular rate & rhythm and no murmurs and no lower extremity  edema ABDOMEN:abdomen soft, mild discomfort with palpation on the left upper quadrant Musculoskeletal:no cyanosis of digits and no clubbing  NEURO: alert & oriented x 3 with fluent speech, no focal motor/sensory deficits  LABORATORY DATA:  I have reviewed the data as listed    Component Value Date/Time   NA 137 05/15/2019 0515   NA 141 04/25/2017 1041   K 4.5 05/15/2019 0515   K 4.6 04/25/2017 1041   CL 103 05/15/2019 0515   CL 104 10/27/2012 1209   CO2 24 05/15/2019 0515   CO2 26 04/25/2017 1041   GLUCOSE 135 (H) 05/15/2019 0515   GLUCOSE 97 04/25/2017 1041   GLUCOSE 104 (H) 10/27/2012 1209   BUN 13 05/15/2019 0515   BUN 13.1 04/25/2017 1041   CREATININE 0.61 05/15/2019 0515   CREATININE 0.8 04/25/2017 1041   CALCIUM 9.3 05/15/2019 0515   CALCIUM 9.8 04/25/2017 1041   PROT 6.3 (L) 05/15/2019 0515   PROT 7.5 04/25/2017 1041   ALBUMIN 3.1 (L) 05/15/2019 0515   ALBUMIN 4.4 04/25/2017 1041   AST 26 05/15/2019 0515   AST 23 04/25/2017 1041   ALT 52 (H) 05/15/2019 0515   ALT 14 04/25/2017 1041   ALKPHOS 95 05/15/2019 0515   ALKPHOS 69 04/25/2017 1041   BILITOT 1.0 05/15/2019 0515   BILITOT 0.95 04/25/2017 1041   GFRNONAA >60 05/15/2019 0515   GFRAA >60 05/15/2019 0515    No results found for: SPEP, UPEP  Lab Results  Component Value Date   WBC 135.0 (HH) 05/15/2019   NEUTROABS 11.7 (H) 05/14/2019   HGB 10.0 (L) 05/15/2019   HCT 33.6 (L) 05/15/2019   MCV 110.9 (H) 05/15/2019   PLT 141 (L) 05/15/2019      Chemistry      Component Value Date/Time   NA 137 05/15/2019 0515   NA 141 04/25/2017 1041   K 4.5 05/15/2019 0515   K 4.6 04/25/2017 1041   CL 103 05/15/2019 0515   CL 104 10/27/2012 1209   CO2 24 05/15/2019 0515   CO2 26 04/25/2017 1041   BUN 13 05/15/2019 0515   BUN 13.1 04/25/2017 1041   CREATININE 0.61 05/15/2019 0515   CREATININE 0.8 04/25/2017 1041      Component Value Date/Time   CALCIUM 9.3 05/15/2019 0515   CALCIUM 9.8 04/25/2017 1041    ALKPHOS 95 05/15/2019 0515   ALKPHOS 69 04/25/2017 1041   AST 26 05/15/2019 0515   AST 23 04/25/2017 1041   ALT 52 (H) 05/15/2019 0515   ALT 14 04/25/2017 1041   BILITOT 1.0 05/15/2019 0515   BILITOT 0.95 04/25/2017 1041       RADIOGRAPHIC STUDIES: I  have personally reviewed the radiological images as listed and agreed with the findings in the report. Ct Chest W Contrast  Result Date: 04/22/2019 CLINICAL DATA:  Chronic lymphocytic leukemia diagnosed 3/19. EXAM: CT CHEST, ABDOMEN, AND PELVIS WITH CONTRAST TECHNIQUE: Multidetector CT imaging of the chest, abdomen and pelvis was performed following the standard protocol during bolus administration of intravenous contrast. CONTRAST:  22mL OMNIPAQUE IOHEXOL 300 MG/ML  SOLN COMPARISON:  None FINDINGS: CT CHEST FINDINGS Cardiovascular: Normal heart size. No pericardial effusion. Mild aortic atherosclerosis. Lad coronary artery calcification. Mediastinum/Nodes: Heterogeneous appearance of the thyroid gland. The trachea appears patent and is midline. Normal appearance of the esophagus. No enlarged supraclavicular, mediastinal or hilar lymph nodes. No axillary adenopathy. Lungs/Pleura: No pleural effusion. Scarring identified within the anterior left lower lobe. Subpleural nodule in the lateral left lower lobe measures 5 mm, image 87/4. 3 mm subpleural nodule in the right middle lobe noted, image 85/4. 2 mm right upper lobe lung nodule is identified, image 66/4. Musculoskeletal: Scoliosis. No acute or suspicious osseous findings. CT ABDOMEN PELVIS FINDINGS Hepatobiliary: Anterior dome of liver cyst measures approximately 8 mm. Gallbladder unremarkable. No biliary dilatation. Pancreas: Unremarkable. No pancreatic ductal dilatation or surrounding inflammatory changes. Spleen: The spleen measures 10.2 x 5.7 by 13.1 cm (volume = 400 cm^3). Scattered low density foci throughout the splenic parenchyma are identified which measure up to 1.3 cm. Adrenals/Urinary  Tract: Normal appearance of the adrenal glands. Bilateral parapelvic cysts identified. No kidney mass or hydronephrosis. There is moderate distension of the urinary bladder. No focal bladder abnormality. Stomach/Bowel: The stomach is normal. The small bowel loops have a normal course and caliber. No abnormal bowel wall thickening, inflammation or distension. Vascular/Lymphatic: Aortic atherosclerosis. Retroperitoneal lymph nodes within the upper abdomen measure up to 9 mm. No adenopathy. Reproductive: Status post hysterectomy. No adnexal masses. Other: No abdominal wall hernia or abnormality. No abdominopelvic ascites. Musculoskeletal: Scoliosis and degenerative disc disease. No acute or significant osseous abnormality. IMPRESSION: 1. No adenopathy within the chest, abdomen or pelvis. 2. Splenomegaly with scattered low-density lesions measuring up to 1.3 cm. 3. Small subpleural nodule in the left lower lobe and right middle lobe measuring up to 5 mm. No follow-up needed if patient is low-risk (and has no known or suspected primary neoplasm). Non-contrast chest CT can be considered in 12 months if patient is high-risk. This recommendation follows the consensus statement: Guidelines for Management of Incidental Pulmonary Nodules Detected on CT Images: From the Fleischner Society 2017; Radiology 2017; 284:228-243. 4. Aortic Atherosclerosis (ICD10-I70.0). Coronary artery atherosclerotic calcifications. Electronically Signed   By: Kerby Moors M.D.   On: 04/22/2019 14:32   Ct Abdomen Pelvis W Contrast  Result Date: 04/22/2019 CLINICAL DATA:  Chronic lymphocytic leukemia diagnosed 3/19. EXAM: CT CHEST, ABDOMEN, AND PELVIS WITH CONTRAST TECHNIQUE: Multidetector CT imaging of the chest, abdomen and pelvis was performed following the standard protocol during bolus administration of intravenous contrast. CONTRAST:  26mL OMNIPAQUE IOHEXOL 300 MG/ML  SOLN COMPARISON:  None FINDINGS: CT CHEST FINDINGS Cardiovascular:  Normal heart size. No pericardial effusion. Mild aortic atherosclerosis. Lad coronary artery calcification. Mediastinum/Nodes: Heterogeneous appearance of the thyroid gland. The trachea appears patent and is midline. Normal appearance of the esophagus. No enlarged supraclavicular, mediastinal or hilar lymph nodes. No axillary adenopathy. Lungs/Pleura: No pleural effusion. Scarring identified within the anterior left lower lobe. Subpleural nodule in the lateral left lower lobe measures 5 mm, image 87/4. 3 mm subpleural nodule in the right middle lobe noted, image 85/4. 2 mm right  upper lobe lung nodule is identified, image 66/4. Musculoskeletal: Scoliosis. No acute or suspicious osseous findings. CT ABDOMEN PELVIS FINDINGS Hepatobiliary: Anterior dome of liver cyst measures approximately 8 mm. Gallbladder unremarkable. No biliary dilatation. Pancreas: Unremarkable. No pancreatic ductal dilatation or surrounding inflammatory changes. Spleen: The spleen measures 10.2 x 5.7 by 13.1 cm (volume = 400 cm^3). Scattered low density foci throughout the splenic parenchyma are identified which measure up to 1.3 cm. Adrenals/Urinary Tract: Normal appearance of the adrenal glands. Bilateral parapelvic cysts identified. No kidney mass or hydronephrosis. There is moderate distension of the urinary bladder. No focal bladder abnormality. Stomach/Bowel: The stomach is normal. The small bowel loops have a normal course and caliber. No abnormal bowel wall thickening, inflammation or distension. Vascular/Lymphatic: Aortic atherosclerosis. Retroperitoneal lymph nodes within the upper abdomen measure up to 9 mm. No adenopathy. Reproductive: Status post hysterectomy. No adnexal masses. Other: No abdominal wall hernia or abnormality. No abdominopelvic ascites. Musculoskeletal: Scoliosis and degenerative disc disease. No acute or significant osseous abnormality. IMPRESSION: 1. No adenopathy within the chest, abdomen or pelvis. 2. Splenomegaly  with scattered low-density lesions measuring up to 1.3 cm. 3. Small subpleural nodule in the left lower lobe and right middle lobe measuring up to 5 mm. No follow-up needed if patient is low-risk (and has no known or suspected primary neoplasm). Non-contrast chest CT can be considered in 12 months if patient is high-risk. This recommendation follows the consensus statement: Guidelines for Management of Incidental Pulmonary Nodules Detected on CT Images: From the Fleischner Society 2017; Radiology 2017; 284:228-243. 4. Aortic Atherosclerosis (ICD10-I70.0). Coronary artery atherosclerotic calcifications. Electronically Signed   By: Kerby Moors M.D.   On: 04/22/2019 14:32    All questions were answered. The patient knows to call the clinic with any problems, questions or concerns. No barriers to learning was detected.   Mikey Bussing, NP 05/15/2019 11:45 AM Heath Lark, MD

## 2019-05-15 NOTE — Progress Notes (Signed)
Initial Nutrition Assessment  INTERVENTION:   -Boost Breeze po TID, each supplement provides 250 kcal and 9 grams of protein  NUTRITION DIAGNOSIS:   Increased nutrient needs related to cancer and cancer related treatments as evidenced by estimated needs.  GOAL:   Patient will meet greater than or equal to 90% of their needs  MONITOR:   Supplement acceptance, Weight trends, PO intake, Labs, I & O's  REASON FOR ASSESSMENT:   Consult Assessment of nutrition requirement/status  ASSESSMENT:   84 y.o. female with medical history significant for breast CA, chronic bronchitis, CLL, presents as a direct admit from cancer center by Dr. Alvy Bimler, requesting admission for pain management and failure to thrive.  Patient was recently started on treatment for her CLL, currently not tolerating treatment, having anorexia, significant abdominal pain, uncontrolled with p.o. meds.  Patient is currently also dehydrated with poor oral intake.  Oncologist requesting admission for pain management, hydration and failure to thrive.  **RD working remotely**  Patient with poor appetite related to abdominal pain and intolerance of chemo for CLL. Per oncology note, pt's chemo is on hold at this time.  Pt now tolerating food and drink better. Pt ate a small breakfast of oatmeal and fruit and lunch was a hamburger with fries. Will order Boost Breeze supplements for additional kcal and protein  Per weight records, pt with stable weights.   Labs reviewed. Medications reviewed.  NUTRITION - FOCUSED PHYSICAL EXAM:  Unable to perform- working remotely.  Diet Order:   Diet Order            Diet regular Room service appropriate? Yes; Fluid consistency: Thin  Diet effective now              EDUCATION NEEDS:   No education needs have been identified at this time  Skin:  Skin Assessment: Reviewed RN Assessment  Last BM:  10/13  Height:   Ht Readings from Last 1 Encounters:  05/14/19 5' (1.524 m)     Weight:   Wt Readings from Last 1 Encounters:  05/14/19 46.3 kg    Ideal Body Weight:  45.5 kg  BMI:  19.9 kg/m^2  Estimated Nutritional Needs:   Kcal:  1450-1650  Protein:  70-80g  Fluid:  1.6L/day  Clayton Bibles, MS, RD, LDN Inpatient Clinical Dietitian Pager: [phone] After Hours Pager: [phone]

## 2019-05-15 NOTE — Evaluation (Signed)
Occupational Therapy Evaluation Patient Details Name: April [name] MRN: [id number] DOB: 07-01-1935 Today's Date: 05/15/2019    History of Present Illness April [name] is a 84 y.o. female with medical history significant for breast CA, chronic bronchitis, CLL, presents as a direct admit from cancer center by Dr. Alvy Bimler, requesting admission for pain management and failure to thrive.  Patient was recently started on treatment for her CLL, currently not tolerating treatment, having anorexia, significant abdominal pain, uncontrolled with p.o. meds.  Patient is currently also dehydrated with poor oral intake.   Clinical Impression   Pt seen for OT evaluation this date. Prior to hospital admission, pt was Indep with all ADLs and enjoyed gardening as a leisure activity.  Pt lives in two story home with significant other and can live on main level.  Currently pt demonstrates impairments in fxl activity tolerance and general strength, but demos appropriate balance and safety awareness required to complete self care. Pt requires increased time to complete all aspects of ADL mobility and self care, but does not require physical assistance or cueing on any level. OT provides education about energy conservation and ways to preserve muscle strength as well as general fall prevention strategies and potentially beneficial DME. Overall, pt is appropriate to d/c home from OT standpoint. No OT f/u appears to be required at this time.     Follow Up Recommendations  No OT follow up    Equipment Recommendations  Tub/shower seat    Recommendations for Other Services       Precautions / Restrictions Precautions Precautions: None      Mobility Bed Mobility               General bed mobility comments: up in recliner upon arrival  Transfers Overall transfer level: Needs assistance   Transfers: Sit to/from Stand Sit to Stand: Supervision         General transfer comment: Supv provided for  safety on eval, but pt with no LOB noted.    Balance Overall balance assessment: Needs assistance   Sitting balance-Leahy Scale: Normal       Standing balance-Leahy Scale: Good                             ADL either performed or assessed with clinical judgement   ADL Overall ADL's : Modified independent                                       General ADL Comments: Pt generally requires increased time to perform tasks d/t decreasd tolerance/endurance, but demos good safety awareness and balance to complete all aspects of ADL transfers, fxl mobility without AD, and seated/standing self care BADLs.     Vision Patient Visual Report: No change from baseline       Perception     Praxis      Pertinent Vitals/Pain Pain Assessment: 0-10 Pain Score: 4  Pain Location: neck and L abdomen, pt recently recieved morphine and is starting to get pain relief when OT enters. Pain Descriptors / Indicators: Sore;Tightness Pain Intervention(s): Limited activity within patient's tolerance;Monitored during session;Premedicated before session     Hand Dominance     Extremity/Trunk Assessment Upper Extremity Assessment Upper Extremity Assessment: Overall WFL for tasks assessed;Generalized weakness   Lower Extremity Assessment Lower Extremity Assessment: Defer to PT evaluation;Generalized weakness  Cervical / Trunk Assessment Cervical / Trunk Assessment: Other exceptions Cervical / Trunk Exceptions: decreased cervical ROM due to neck pain, pt with scoliosis hx, shoulder height is notably uneven which is baseline for pt per her report.   Communication Communication Communication: No difficulties   Cognition Arousal/Alertness: Awake/alert Behavior During Therapy: WFL for tasks assessed/performed Overall Cognitive Status: Within Functional Limits for tasks assessed                                     General Comments  OT provides education re:  fall prevention related to ADL performance, e.g.: shower seat, traction in bottom of shower, use of grab bar in shower, potential need for grab bar near commode, etc. Pt verbalized understanding appropriately.    Exercises Other Exercises Other Exercises: OT facilitated education re: fall prevention strategies in restroom including potentially beneficial DME/modifcaitons. Pt verbalized understanding appropriately.   Shoulder Instructions      Home Living Family/patient expects to be discharged to:: Private residence Living Arrangements: Spouse/significant other Available Help at Discharge: Family;Available 24 hours/day Type of Home: House Home Access: Stairs to enter [employer] of Steps: 3-8 Entrance Stairs-Rails: None Home Layout: Able to live on main level with bedroom/bathroom;Multi-level Alternate Level Stairs-Number of Steps: flight Alternate Level Stairs-Rails: Right Bathroom Shower/Tub: Tub/shower unit;Walk-in shower   Bathroom Toilet: Handicapped height Bathroom Accessibility: Yes   Home Equipment: None          Prior Functioning/Environment Level of Independence: Independent        Comments: Enjoys gardening and was gardening as recently as one week prior to adm.        OT Problem List: Decreased strength;Decreased activity tolerance;Pain      OT Treatment/Interventions:      OT Goals(Current goals can be found in the care plan section) Acute Rehab OT Goals Patient Stated Goal: to get out of the hospital and for this neck pain to go away OT Goal Formulation: All assessment and education complete, DC therapy Potential to Achieve Goals: Good  OT Frequency:     Barriers to D/C:            Co-evaluation              AM-PAC OT "6 Clicks" Daily Activity     Outcome Measure Help from another person eating meals?: None Help from another person taking care of personal grooming?: None Help from another person toileting, which includes using  toliet, bedpan, or urinal?: None Help from another person bathing (including washing, rinsing, drying)?: A Little Help from another person to put on and taking off regular upper body clothing?: None Help from another person to put on and taking off regular lower body clothing?: None 6 Click Score: 23   End of Session Equipment Utilized During Treatment: Gait belt  Activity Tolerance: Patient tolerated treatment well Patient left: in bed;with call bell/phone within reach  OT Visit Diagnosis: Muscle weakness (generalized) (M62.81)                Time: [id number] OT Time Calculation (min): 26 min Charges:  OT General Charges $OT Visit: 1 Visit OT Evaluation $OT Eval Low Complexity: 1 Low OT Treatments $Self Care/Home Management : 8-22 mins  Gerrianne Scale, MS, OTR/L Pager [phone] 05/15/19, 3:12 PM

## 2019-05-15 NOTE — Evaluation (Signed)
Physical Therapy Evaluation Patient Details Name: April [name] MRN: [id number] DOB: 15-Jul-1935 Today's Date: 05/15/2019   History of Present Illness  April [name] is a 84 y.o. female with medical history significant for breast CA, chronic bronchitis, CLL, presents as a direct admit from cancer center by Dr. Alvy Bimler, requesting admission for pain management and failure to thrive.  Patient was recently started on treatment for her CLL, currently not tolerating treatment, having anorexia, significant abdominal pain, uncontrolled with p.o. meds.  Patient is currently also dehydrated with poor oral intake.  Clinical Impression  Pt admitted with above diagnosis.  Pt currently with functional limitations due to the deficits listed below (see PT Problem List). Pt will benefit from skilled PT to increase their independence and safety with mobility to allow discharge to the venue listed below.  Pt nervous that the medicine she may need to start will be bad for her bones.  Focused on education for fall prevention including staying on first floor, adding hand rails at entry to home, and general fall prevention tips regarding home environment. Pt was appreciative of education.  She was able to ambulate in the hallway without AD and do not feel she will need any follow up after d/c from acute care.     Follow Up Recommendations No PT follow up    Equipment Recommendations  None recommended by PT    Recommendations for Other Services       Precautions / Restrictions Precautions Precautions: None Restrictions Weight Bearing Restrictions: No      Mobility  Bed Mobility               General bed mobility comments: up in recliner upon arrival  Transfers Overall transfer level: Needs assistance   Transfers: Sit to/from Stand Sit to Stand: Supervision         General transfer comment: S for safety  Ambulation/Gait Ambulation/Gait assistance: Min guard;Supervision Gait Distance  (Feet): 500 Feet Assistive device: None Gait Pattern/deviations: Step-through pattern;Decreased step length - right;Decreased step length - left Gait velocity: WNL   General Gait Details: MIN/guard initially, but progressed to S as gait progressed.  No LOB noted, but a bit tentative at times.  Stairs            Wheelchair Mobility    Modified Rankin (Stroke Patients Only)       Balance Overall balance assessment: Needs assistance   Sitting balance-Leahy Scale: Good       Standing balance-Leahy Scale: Fair                               Pertinent Vitals/Pain Pain Assessment: 0-10 Pain Score: 2  Pain Location: neck and L abdomen Pain Descriptors / Indicators: Dull Pain Intervention(s): Premedicated before session;Limited activity within patient's tolerance;Monitored during session    [place] expects to be discharged to:: Private residence Living Arrangements: Spouse/significant other Available Help at Discharge: Family;Available 24 hours/day Type of Home: House Home Access: Stairs to enter Entrance Stairs-Rails: None Entrance Stairs-Number of Steps: 3-8 Home Layout: Able to live on main level with bedroom/bathroom;Multi-level(her bedroom is upstairs, but can stay on main level) Home Equipment: None      Prior Function Level of Independence: Independent         Comments: enjoys to garden, cook, and read.  A week ago she was out watering her yard.     Hand Dominance  Extremity/Trunk Assessment   Upper Extremity Assessment Upper Extremity Assessment: Defer to OT evaluation    Lower Extremity Assessment Lower Extremity Assessment: Overall WFL for tasks assessed    Cervical / Trunk Assessment Cervical / Trunk Assessment: Other exceptions Cervical / Trunk Exceptions: decreased cervical ROM due to pain.  Communication   Communication: No difficulties  Cognition Arousal/Alertness: Awake/alert Behavior During  Therapy: WFL for tasks assessed/performed Overall Cognitive Status: Within Functional Limits for tasks assessed                                        General Comments General comments (skin integrity, edema, etc.): Education provided on fall risks at home including use of night lights and getting rid of throw rugs.  Discussed being pro-active about getting hand rails at entry to the house so that if she needed them in the future, she would already have them. Pt was in agreement.    Exercises     Assessment/Plan    PT Assessment Patient needs continued PT services  PT Problem List Decreased strength;Decreased activity tolerance;Decreased balance;Decreased mobility;Decreased knowledge of precautions       PT Treatment Interventions Gait training;Stair training;Functional mobility training;Balance training;Therapeutic exercise;Therapeutic activities;Patient/family education    PT Goals (Current goals can be found in the Care Plan section)  Acute Rehab PT Goals Patient Stated Goal: home PT Goal Formulation: With patient Time For Goal Achievement: 05/29/19 Potential to Achieve Goals: Good    Frequency Min 3X/week   Barriers to discharge Inaccessible home environment multi-level home    Co-evaluation               AM-PAC PT "6 Clicks" Mobility  Outcome Measure Help needed turning from your back to your side while in a flat bed without using bedrails?: None Help needed moving from lying on your back to sitting on the side of a flat bed without using bedrails?: None Help needed moving to and from a bed to a chair (including a wheelchair)?: None Help needed standing up from a chair using your arms (e.g., wheelchair or bedside chair)?: None Help needed to walk in hospital room?: A Little Help needed climbing 3-5 steps with a railing? : A Little 6 Click Score: 22    End of Session   Activity Tolerance: Patient tolerated treatment well Patient left: in  chair;with call bell/phone within reach Nurse Communication: Mobility status PT Visit Diagnosis: Muscle weakness (generalized) (M62.81);Difficulty in walking, not elsewhere classified (R26.2)    Time: [id number] PT Time Calculation (min) (ACUTE ONLY): 32 min   Charges:   PT Evaluation $PT Eval Low Complexity: 1 Low PT Treatments $Gait Training: 8-22 mins        Shantera Monts L. Tamala Julian, [place] Pager [phone] 05/15/2019   Galen Manila 05/15/2019, 11:30 AM

## 2019-05-16 DIAGNOSIS — C9110 Chronic lymphocytic leukemia of B-cell type not having achieved remission: Secondary | ICD-10-CM | POA: Diagnosis present

## 2019-05-16 DIAGNOSIS — D696 Thrombocytopenia, unspecified: Secondary | ICD-10-CM | POA: Diagnosis present

## 2019-05-16 DIAGNOSIS — Z20828 Contact with and (suspected) exposure to other viral communicable diseases: Secondary | ICD-10-CM | POA: Diagnosis present

## 2019-05-16 DIAGNOSIS — Z9013 Acquired absence of bilateral breasts and nipples: Secondary | ICD-10-CM | POA: Diagnosis not present

## 2019-05-16 DIAGNOSIS — M810 Age-related osteoporosis without current pathological fracture: Secondary | ICD-10-CM | POA: Diagnosis present

## 2019-05-16 DIAGNOSIS — Z90710 Acquired absence of both cervix and uterus: Secondary | ICD-10-CM | POA: Diagnosis not present

## 2019-05-16 DIAGNOSIS — Z90722 Acquired absence of ovaries, bilateral: Secondary | ICD-10-CM | POA: Diagnosis not present

## 2019-05-16 DIAGNOSIS — Z9049 Acquired absence of other specified parts of digestive tract: Secondary | ICD-10-CM | POA: Diagnosis not present

## 2019-05-16 DIAGNOSIS — Z881 Allergy status to other antibiotic agents status: Secondary | ICD-10-CM | POA: Diagnosis not present

## 2019-05-16 DIAGNOSIS — Z8572 Personal history of non-Hodgkin lymphomas: Secondary | ICD-10-CM | POA: Diagnosis not present

## 2019-05-16 DIAGNOSIS — R627 Adult failure to thrive: Secondary | ICD-10-CM | POA: Diagnosis present

## 2019-05-16 DIAGNOSIS — R161 Splenomegaly, not elsewhere classified: Secondary | ICD-10-CM | POA: Diagnosis present

## 2019-05-16 DIAGNOSIS — I10 Essential (primary) hypertension: Secondary | ICD-10-CM | POA: Diagnosis present

## 2019-05-16 DIAGNOSIS — Z87891 Personal history of nicotine dependence: Secondary | ICD-10-CM | POA: Diagnosis not present

## 2019-05-16 DIAGNOSIS — Z853 Personal history of malignant neoplasm of breast: Secondary | ICD-10-CM | POA: Diagnosis not present

## 2019-05-16 DIAGNOSIS — D638 Anemia in other chronic diseases classified elsewhere: Secondary | ICD-10-CM | POA: Diagnosis present

## 2019-05-16 DIAGNOSIS — R748 Abnormal levels of other serum enzymes: Secondary | ICD-10-CM | POA: Diagnosis present

## 2019-05-16 DIAGNOSIS — E860 Dehydration: Secondary | ICD-10-CM | POA: Diagnosis present

## 2019-05-16 DIAGNOSIS — Z806 Family history of leukemia: Secondary | ICD-10-CM | POA: Diagnosis not present

## 2019-05-16 DIAGNOSIS — G893 Neoplasm related pain (acute) (chronic): Secondary | ICD-10-CM | POA: Diagnosis present

## 2019-05-16 DIAGNOSIS — Z9079 Acquired absence of other genital organ(s): Secondary | ICD-10-CM | POA: Diagnosis not present

## 2019-05-16 DIAGNOSIS — J42 Unspecified chronic bronchitis: Secondary | ICD-10-CM | POA: Diagnosis present

## 2019-05-16 DIAGNOSIS — Z885 Allergy status to narcotic agent status: Secondary | ICD-10-CM | POA: Diagnosis not present

## 2019-05-16 DIAGNOSIS — Z681 Body mass index (BMI) 19 or less, adult: Secondary | ICD-10-CM | POA: Diagnosis not present

## 2019-05-16 LAB — CBC WITH DIFFERENTIAL/PLATELET
Abs Immature Granulocytes: 0.28 10*3/uL — ABNORMAL HIGH (ref 0.00–0.07)
Basophils Absolute: 0.1 10*3/uL (ref 0.0–0.1)
Basophils Relative: 0 %
Eosinophils Absolute: 0 10*3/uL (ref 0.0–0.5)
Eosinophils Relative: 0 %
HCT: 34.1 % — ABNORMAL LOW (ref 36.0–46.0)
Hemoglobin: 10 g/dL — ABNORMAL LOW (ref 12.0–15.0)
Immature Granulocytes: 0 %
Lymphocytes Relative: 94 %
Lymphs Abs: 121.9 10*3/uL — ABNORMAL HIGH (ref 0.7–4.0)
MCH: 32.8 pg (ref 26.0–34.0)
MCHC: 29.3 g/dL — ABNORMAL LOW (ref 30.0–36.0)
MCV: 111.8 fL — ABNORMAL HIGH (ref 80.0–100.0)
Monocytes Absolute: 0.8 10*3/uL (ref 0.1–1.0)
Monocytes Relative: 1 %
Neutro Abs: 5.8 10*3/uL (ref 1.7–7.7)
Neutrophils Relative %: 5 %
Platelets: 159 10*3/uL (ref 150–400)
RBC: 3.05 MIL/uL — ABNORMAL LOW (ref 3.87–5.11)
RDW: 16 % — ABNORMAL HIGH (ref 11.5–15.5)
WBC: 128.9 10*3/uL (ref 4.0–10.5)
nRBC: 0 % (ref 0.0–0.2)

## 2019-05-16 LAB — IRON AND TIBC
Iron: 48 ug/dL (ref 28–170)
Saturation Ratios: 24 % (ref 10.4–31.8)
TIBC: 201 ug/dL — ABNORMAL LOW (ref 250–450)
UIBC: 153 ug/dL

## 2019-05-16 LAB — BASIC METABOLIC PANEL
Anion gap: 8 (ref 5–15)
BUN: 13 mg/dL (ref 8–23)
CO2: 26 mmol/L (ref 22–32)
Calcium: 9.1 mg/dL (ref 8.9–10.3)
Chloride: 104 mmol/L (ref 98–111)
Creatinine, Ser: 0.65 mg/dL (ref 0.44–1.00)
GFR calc Af Amer: >60 mL/min (ref >60)
GFR calc non Af Amer: >60 mL/min (ref >60)
Glucose, Bld: 97 mg/dL (ref 70–99)
Potassium: 4.3 mmol/L (ref 3.5–5.1)
Sodium: 138 mmol/L (ref 135–145)

## 2019-05-16 LAB — FERRITIN: Ferritin: 165 ng/mL (ref 11–307)

## 2019-05-16 LAB — VITAMIN B12: Vitamin B-12: 522 pg/mL (ref 180–914)

## 2019-05-16 LAB — FOLATE: Folate: 12.6 ng/mL (ref >5.9)

## 2019-05-16 NOTE — Progress Notes (Signed)
PROGRESS NOTE  April [name] [id number] DOB: [birth year] DOA: 05/14/2019 PCP: Isaac Bliss, Rayford Halsted, MD  HPI/Recap of past 24 hours: April [name] is a 84 y.o. female with medical history significant for breast CA, chronic bronchitis, CLL, presents as a direct admit from cancer center by Dr. Alvy Bimler, requesting admission for pain management and failure to thrive.  Patient was recently started on treatment for her CLL, currently not tolerating treatment, having anorexia, significant abdominal pain, uncontrolled with p.o. meds.  Patient is currently also dehydrated with poor oral intake.  Oncologist requesting admission for pain management, hydration and failure to thrive.  Currently reports left upper quadrant pain likely secondary to splenomegaly, profound weakness from inability to tolerate orally.  Patient denies any chest pain, shortness of breath, nausea/vomiting, fever/chills, diarrhea.,  Patient was started on ibrutinib on 04/28/2019.    Today, patient still reported significant left upper quadrant pain, as well as significant left-sided neck pain.  Denies any headaches, nausea/vomiting, fever/chills, chest pain, shortness of breath.  Noted to be requiring IV morphine.  Advised patient to request for pain meds before it gets to the level of 10/10.  Assessment/Plan: Principal Problem:   Failure to thrive in adult Active Problems:   Chronic lymphocytic leukemia (HCC)   Essential hypertension   Cancer associated pain   Dehydration  Intractable cancer associated pain Severe pain worsened by splenomegaly Still requiring IV narcotics May consider switching to longer acting narcotics prior to discharge Bowel regimen  Failure to thrive/dehydration Likely 2/2 poorly tolerating chemotherapy P.o. intake improving gradually We will DC IV fluids, to prevent overload Dietitian consult  Chronic lymphocytic leukemia Noted with significantly elevated leukocytes/lymphocytosis,  currently improving from previous Poorly tolerating treatment, started ibrutinib on 04/28/2019 Oncology Dr. Alvy Bimler on board, recommend short-term prednisone therapy to treat her CLL as well as help with her pain from splenomegaly Oncology will follow Continue prednisone 20 mg daily as recommended  Anemia chronic disease/macrocytic anemia Likely due to above Anemia panel with iron 48, TIBC 201, sats 24, ferritin 165, folate 12.6, vitamin B12 523 Daily CBC  Abnormal liver enzymes Likely due to side effects of ibrutinib Monitor closely  HTN Uncontrolled likely 2/2 pain Continue home amlodipine          Malnutrition Type:  Nutrition Problem: Increased nutrient needs Etiology: cancer and cancer related treatments   Malnutrition Characteristics:  Signs/Symptoms: estimated needs   Nutrition Interventions:  Interventions: Boost Breeze    Estimated body mass index is 19.92 kg/m as calculated from the following:   Height as of this encounter: 5' (1.524 m).   Weight as of this encounter: 46.3 kg.     Code Status: Full  Family Communication: None at bedside  Disposition Plan: Likely home   Consultants:  Oncology, Dr. Alvy Bimler  Procedures: None  Antimicrobials:  None  DVT prophylaxis: Enoxaparin   Objective: Vitals:   05/15/19 1900 05/15/19 2035 05/16/19 0519 05/16/19 1302  BP:  (!) 145/78 (!) 165/74 135/68  Pulse:  81 76 78  Resp:  16 16 18   Temp:  97.9 F (36.6 C) (!) 97.4 F (36.3 C) 98.2 F (36.8 C)  TempSrc:  Oral Oral Oral  SpO2:  97% 97% 96%  Weight: 46.3 kg     Height: 5' (1.524 m)       Intake/Output Summary (Last 24 hours) at 05/16/2019 1426 Last data filed at 05/16/2019 0854 Gross per 24 hour  Intake 240 ml  Output 700 ml  Net -460 ml  Filed Weights   05/15/19 1900  Weight: 46.3 kg    Exam:  General: NAD, chronically ill-appearing, frail  Cardiovascular: S1, S2 present  Respiratory: CTAB  Abdomen: Soft, tender  LUQ, nondistended, bowel sounds present  Musculoskeletal: No bilateral pedal edema noted  Skin:  Varicose veins noted, chronic rash noted on LLE  Psychiatry: Normal mood           Data Reviewed: CBC: Recent Labs  Lab 05/14/19 1044 05/15/19 0515 05/16/19 0506  WBC 166.2* 135.0* 128.9*  NEUTROABS 11.7*  --  5.8  HGB 11.2* 10.0* 10.0*  HCT 36.4 33.6* 34.1*  MCV 107.4* 110.9* 111.8*  PLT 158 141* [id number]   Basic Metabolic Panel: Recent Labs  Lab 05/14/19 1044 05/15/19 0515 05/16/19 0506  NA 135 137 138  K 4.1 4.5 4.3  CL 97* 103 104  CO2 25 24 26   GLUCOSE 134* 135* 97  BUN 13 13 13   CREATININE 0.72 0.61 0.65  CALCIUM 10.7* 9.3 9.1   GFR: Estimated Creatinine Clearance: 39.6 mL/min (by C-G formula based on SCr of 0.65 mg/dL). Liver Function Tests: Recent Labs  Lab 05/14/19 1044 05/15/19 0515  AST 45* 26  ALT 74* 52*  ALKPHOS 133* 95  BILITOT 1.8* 1.0  PROT 7.5 6.3*  ALBUMIN 3.8 3.1*   No results for input(s): LIPASE, AMYLASE in the last 168 hours. No results for input(s): AMMONIA in the last 168 hours. Coagulation Profile: No results for input(s): INR, PROTIME in the last 168 hours. Cardiac Enzymes: No results for input(s): CKTOTAL, CKMB, CKMBINDEX, TROPONINI in the last 168 hours. BNP (last 3 results) No results for input(s): PROBNP in the last 8760 hours. HbA1C: No results for input(s): HGBA1C in the last 72 hours. CBG: No results for input(s): GLUCAP in the last 168 hours. Lipid Profile: No results for input(s): CHOL, HDL, LDLCALC, TRIG, CHOLHDL, LDLDIRECT in the last 72 hours. Thyroid Function Tests: No results for input(s): TSH, T4TOTAL, FREET4, T3FREE, THYROIDAB in the last 72 hours. Anemia Panel: Recent Labs    05/16/19 0506  VITAMINB12 522  FOLATE 12.6  FERRITIN 165  TIBC 201*  IRON 48   Urine analysis:    Component Value Date/Time   COLORURINE YELLOW 04/01/2015 [place] 04/01/2015 1347   LABSPEC 1.009 04/01/2015 1347    PHURINE 6.5 04/01/2015 1347   GLUCOSEU NEGATIVE 04/01/2015 1347   HGBUR NEGATIVE 04/01/2015 1347   BILIRUBINUR n 01/23/2016 0927   KETONESUR NEGATIVE 04/01/2015 1347   PROTEINUR n 01/23/2016 0927   PROTEINUR NEGATIVE 04/01/2015 1347   UROBILINOGEN 0.2 01/23/2016 0927   UROBILINOGEN 0.2 03/30/2014 1203   NITRITE n 01/23/2016 0927   NITRITE NEGATIVE 04/01/2015 1347   LEUKOCYTESUR Negative 01/23/2016 0927   Sepsis Labs: @LABRCNTIP (procalcitonin:4,lacticidven:4)  ) Recent Results (from the past 240 hour(s))  SARS CORONAVIRUS 2 (TAT 6-24 HRS) Nasopharyngeal Nasopharyngeal Swab     Status: None   Collection Time: 05/14/19  7:00 PM   Specimen: Nasopharyngeal Swab  Result Value Ref Range Status   SARS Coronavirus 2 NEGATIVE NEGATIVE Final    Comment: (NOTE) SARS-CoV-2 target nucleic acids are NOT DETECTED. The SARS-CoV-2 RNA is generally detectable in upper and lower respiratory specimens during the acute phase of infection. Negative results do not preclude SARS-CoV-2 infection, do not rule out co-infections with other pathogens, and should not be used as the sole basis for treatment or other patient management decisions. Negative results must be combined with clinical observations, patient history, and epidemiological information. The  expected result is Negative. Fact Sheet for Patients: SugarRoll.be Fact Sheet for Healthcare Providers: https://www.woods-mathews.com/ This test is not yet approved or cleared by the [place] FDA and  has been authorized for detection and/or diagnosis of SARS-CoV-2 by FDA under an Emergency Use Authorization (EUA). This EUA will remain  in effect (meaning this test can be used) for the duration of the COVID-19 declaration under Section 56 4(b)(1) of the Act, 21 U.S.C. section 360bbb-3(b)(1), unless the authorization is terminated or revoked sooner. Performed at [facility], [place] [address],  [place], [place] [address]       Studies: No results found.  Scheduled Meds: . acyclovir  400 mg Oral Daily  . allopurinol  300 mg Oral Daily  . amLODipine  2.5 mg Oral Daily  . enoxaparin (LOVENOX) injection  40 mg Subcutaneous Q24H  . feeding supplement  1 Container Oral TID BM  . polyethylene glycol  17 g Oral Daily  . predniSONE  20 mg Oral Q breakfast  . sodium chloride flush  3 mL Intravenous Q12H  . sodium chloride flush  3 mL Intravenous Q12H    Continuous Infusions: . sodium chloride       LOS: 1 day     Alma Friendly, MD [facility]  If 7PM-7AM, please contact night-coverage [email or web address] 05/16/2019, 2:26 PM

## 2019-05-16 NOTE — Progress Notes (Signed)
Physical Therapy Treatment Patient Details Name: April [name] MRN: [id number] DOB: [birth year] Today's Date: 05/16/2019    History of Present Illness April [name] is a 84 y.o. female with medical history significant for breast CA, chronic bronchitis, CLL, presents as a direct admit from cancer center by Dr. Alvy Bimler, requesting admission for pain management and failure to thrive.  Patient was recently started on treatment for her CLL, currently not tolerating treatment, having anorexia, significant abdominal pain, uncontrolled with p.o. meds.  Patient is currently also dehydrated with poor oral intake.    PT Comments    Pt reports 10/10 L sided neck and L lateral trunk pain. She ambulated 14' x 2 with RW, distance limited by pain and nausea.  Hot pack issued for neck pain. Significant decrease in activity tolerance today compared to yesterday.    Follow Up Recommendations  No PT follow up     Equipment Recommendations  None recommended by PT    Recommendations for Other Services       Precautions / Restrictions Precautions Precautions: Fall Restrictions Weight Bearing Restrictions: No    Mobility  Bed Mobility               General bed mobility comments: up on edge of bed upon arrival  Transfers Overall transfer level: Needs assistance Equipment used: Rolling walker (2 wheeled) Transfers: Sit to/from Stand Sit to Stand: Min guard         General transfer comment: min/guard for safety  Ambulation/Gait Ambulation/Gait assistance: Min guard Gait Distance (Feet): 28 Feet(14' x 2 with seated rest on commode) Assistive device: Rolling walker (2 wheeled) Gait Pattern/deviations: Step-through pattern;Decreased step length - right;Decreased step length - left Gait velocity: WNL   General Gait Details: MIN/guard for safety,  No LOB noted, but a bit tentative at times. 10/10  Pain limited distance.   Stairs             Wheelchair Mobility    Modified  Rankin (Stroke Patients Only)       Balance     Sitting balance-Leahy Scale: Good       Standing balance-Leahy Scale: Fair                              Cognition Arousal/Alertness: Awake/alert Behavior During Therapy: WFL for tasks assessed/performed Overall Cognitive Status: Within Functional Limits for tasks assessed                                        Exercises      General Comments        Pertinent Vitals/Pain Pain Score: 10-Worst pain ever Pain Location: L lateral trunk and L neck Pain Descriptors / Indicators: Tightness Pain Intervention(s): Limited activity within patient's tolerance;Monitored during session;Premedicated before session;Heat applied    Home Living                      Prior Function            PT Goals (current goals can now be found in the care plan section) Acute Rehab PT Goals Patient Stated Goal: to get out of the hospital and for this neck pain to go away PT Goal Formulation: With patient Time For Goal Achievement: 05/29/19 Potential to Achieve Goals: Good Progress towards PT goals: Progressing toward goals  Frequency    Min 3X/week      PT Plan Current plan remains appropriate    Co-evaluation              AM-PAC PT "6 Clicks" Mobility   Outcome Measure  Help needed turning from your back to your side while in a flat bed without using bedrails?: None Help needed moving from lying on your back to sitting on the side of a flat bed without using bedrails?: None Help needed moving to and from a bed to a chair (including a wheelchair)?: A Little Help needed standing up from a chair using your arms (e.g., wheelchair or bedside chair)?: A Little Help needed to walk in hospital room?: A Little Help needed climbing 3-5 steps with a railing? : A Little 6 Click Score: 20    End of Session   Activity Tolerance: Patient limited by pain Patient left: in chair;with call bell/phone  within reach Nurse Communication: Mobility status(pt is nauseous) PT Visit Diagnosis: Muscle weakness (generalized) (M62.81);Difficulty in walking, not elsewhere classified (R26.2)     Time: [id number] PT Time Calculation (min) (ACUTE ONLY): 13 min  Charges:  $Gait Training: 8-22 mins                     Blondell Reveal Kistler PT 05/16/2019  Acute Rehabilitation Services Pager [phone] Office [phone]

## 2019-05-17 ENCOUNTER — Encounter (HOSPITAL_COMMUNITY): Payer: Self-pay

## 2019-05-17 LAB — CBC WITH DIFFERENTIAL/PLATELET
Abs Immature Granulocytes: 0.31 10*3/uL — ABNORMAL HIGH (ref 0.00–0.07)
Basophils Absolute: 0 10*3/uL (ref 0.0–0.1)
Basophils Relative: 0 %
Eosinophils Absolute: 0 10*3/uL (ref 0.0–0.5)
Eosinophils Relative: 0 %
HCT: 36.5 % (ref 36.0–46.0)
Hemoglobin: 10.7 g/dL — ABNORMAL LOW (ref 12.0–15.0)
Immature Granulocytes: 0 %
Lymphocytes Relative: 96 %
Lymphs Abs: 161.9 10*3/uL — ABNORMAL HIGH (ref 0.7–4.0)
MCH: 32.5 pg (ref 26.0–34.0)
MCHC: 29.3 g/dL — ABNORMAL LOW (ref 30.0–36.0)
MCV: 110.9 fL — ABNORMAL HIGH (ref 80.0–100.0)
Monocytes Absolute: 0.7 10*3/uL (ref 0.1–1.0)
Monocytes Relative: 0 %
Neutro Abs: 5.8 10*3/uL (ref 1.7–7.7)
Neutrophils Relative %: 4 %
Platelets: 199 10*3/uL (ref 150–400)
RBC: 3.29 MIL/uL — ABNORMAL LOW (ref 3.87–5.11)
RDW: 16 % — ABNORMAL HIGH (ref 11.5–15.5)
WBC: 168.8 10*3/uL (ref 4.0–10.5)
nRBC: 0 % (ref 0.0–0.2)

## 2019-05-17 LAB — BASIC METABOLIC PANEL
Anion gap: 8 (ref 5–15)
BUN: 12 mg/dL (ref 8–23)
CO2: 28 mmol/L (ref 22–32)
Calcium: 9.9 mg/dL (ref 8.9–10.3)
Chloride: 100 mmol/L (ref 98–111)
Creatinine, Ser: 0.63 mg/dL (ref 0.44–1.00)
GFR calc Af Amer: >60 mL/min (ref >60)
GFR calc non Af Amer: >60 mL/min (ref >60)
Glucose, Bld: 111 mg/dL — ABNORMAL HIGH (ref 70–99)
Potassium: 4.1 mmol/L (ref 3.5–5.1)
Sodium: 136 mmol/L (ref 135–145)

## 2019-05-17 MED ORDER — PREDNISONE 20 MG PO TABS: 20.0000 mg | ORAL_TABLET | Freq: Every day | ORAL | 0 refills | Status: DC

## 2019-05-17 MED ORDER — POLYETHYLENE GLYCOL 3350 17 G PO PACK: 17.0000 g | PACK | Freq: Every day | ORAL | 0 refills | Status: DC | PRN

## 2019-05-17 MED ORDER — OXYCODONE HCL 5 MG PO TABS: 5.0000 mg | ORAL_TABLET | ORAL | 0 refills | Status: DC | PRN

## 2019-05-17 MED ORDER — SENNOSIDES-DOCUSATE SODIUM 8.6-50 MG PO TABS: 1.0000 | ORAL_TABLET | Freq: Every evening | ORAL | 0 refills | Status: DC | PRN

## 2019-05-17 MED ORDER — AMLODIPINE BESYLATE 5 MG PO TABS: 5.0000 mg | ORAL_TABLET | Freq: Every day | ORAL | 0 refills | Status: DC

## 2019-05-17 NOTE — Discharge Summary (Signed)
Physician Discharge Summary  April [name] [id number] DOB: 10/16/1934 DOA: 05/14/2019  PCP: April [name], April Halsted, MD  Admit date: 05/14/2019 Discharge date: 05/17/2019  Admitted From: Home Disposition:  Home  Discharge Condition:Stable CODE STATUS:FULL Diet recommendation: Heart Healthy   Brief/Interim Summary: April Molter Johnsonis a 84 y.o.femalewith medical history significant forbreast CA, chronic bronchitis, CLL, presents as a direct admit from cancer center by April. Larey [name] [name] for pain management and failure to thrive.Patient was recently started on treatment for her CLL,currently not tolerating treatment, having anorexia, significant abdominal pain,uncontrolled with p.o. meds. Patient was also dehydrated with poor oral intake. Oncologist requested [name] for pain management,hydration and failure to thrive. She reported left upper quadrant pain likely secondary to splenomegaly, profound weakness from inability to tolerate orally. Patient's hospital course remained stable.  Her abdominal pain gradually improved with pain meds.  She was seen by PT/OT and did not recommend any follow-up.  This morning she is hemodynamically stable.  Has abdominal pain has been stable, she is tolerating diet and she does not complain of her nausea and vomiting.  She is medically stable for discharge home today.  Following problems were addressed during her hospitalization:  Intractable cancer associated pain Severe pain worsened by splenomegaly Abdominal pain is well controlled today.  Continue oxycodone.  Continue bowel regimen.  Failure to thrive/dehydration Likely 2/2 poorly tolerating chemotherapy P.o. intake improved, she is tolerating diet.  Chronic lymphocytic leukemia Noted with significantly elevated leukocytes/lymphocytosis,currently improving from previous Poorly tolerating treatment,started ibrutinib on 04/28/2019 Oncology April [name] board,  recommend short-term prednisone therapy to treat her CLL as well as help with her pain from splenomegaly Oncology will follow Continue prednisone 20 mg daily as recommended  Anemia chronic disease/macrocytic anemia Likely due to above Anemia panel with iron 48, TIBC 201, sats 24, ferritin 165, folate 12.6, vitamin B12 523  Abnormal liver enzymes Likely due to side effects of ibrutinib Monitor as an outpatient  HTN Hypertensive this mrng. Continue  amlodipine at 5 mg daily   Discharge Diagnoses:  Principal Problem:   Failure to thrive in adult Active Problems:   Chronic lymphocytic leukemia (HCC)   Essential hypertension   Cancer associated pain   Dehydration    Discharge Instructions  Discharge Instructions    Diet - low sodium heart healthy   Complete by: As directed    Discharge instructions   Complete by: As directed    1)Pleae follow up with your PCP in a week. 2)Follow up with your oncologist April [name] as an outpatient 3)Take prescribed medications as instructed.   Increase activity slowly   Complete by: As directed      Allergies as of 05/17/2019      Reactions   Codeine Nausea Only   Levofloxacin Rash      Medication List    TAKE these medications   acyclovir 400 MG tablet Commonly known as: ZOVIRAX Take 1 tablet (400 mg total) by mouth daily.   Advil 200 MG tablet Generic drug: ibuprofen Take 200 mg by mouth every 6 (six) hours as needed.   allopurinol 300 MG tablet Commonly known as: ZYLOPRIM Take 1 tablet (300 mg total) by mouth daily.   amLODipine 5 MG tablet Commonly known as: NORVASC Take 1 tablet (5 mg total) by mouth daily. What changed:   medication strength  how much to take   augmented betamethasone dipropionate 0.05 % cream Commonly known as: DIPROLENE-AF Apply 1 application topically 2 (two) times daily as needed (flare ups).  calcium-vitamin D 500-200 MG-UNIT tablet Commonly known as: OSCAL WITH D Take 1 tablet by  mouth 2 (two) times daily.   nystatin cream Commonly known as: MYCOSTATIN Apply 1 application topically 2 (two) times daily.   ondansetron 4 MG tablet Commonly known as: ZOFRAN Take 1 tablet (4 mg total) by mouth every 6 (six) hours as needed for nausea.   oxyCODONE 5 MG immediate release tablet Commonly known as: Oxy IR/ROXICODONE Take 1 tablet (5 mg total) by mouth every 4 (four) hours as needed for moderate pain.   polyethylene glycol 17 g packet Commonly known as: MIRALAX / GLYCOLAX Take 17 g by mouth daily as needed for moderate constipation.   predniSONE 20 MG tablet Commonly known as: DELTASONE Take 1 tablet (20 mg total) by mouth daily with breakfast.   prochlorperazine 5 MG tablet Commonly known as: COMPAZINE Take 1 tablet (5 mg total) by mouth every 6 (six) hours as needed for nausea or vomiting.   senna-docusate 8.6-50 MG tablet Commonly known as: Senokot-S Take 1 tablet by mouth at bedtime as needed for mild constipation.   terconazole 0.4 % vaginal cream Commonly known as: TERAZOL 7 INSERT ONE APPLICATOR VAGINALLY EVERY 2 WEEKS What changed:   how much to take  how to take this  when to take this  additional instructions   triamcinolone ointment 0.5 % Commonly known as: KENALOG Apply 1 application topically 2 (two) times daily.      Follow-up Information    April [name], April Halsted, MD. Schedule an appointment as soon as possible for a visit in 1 week(s).   Specialty: Internal Medicine Contact information: [place] [address] [phone]          Allergies  Allergen Reactions  . Codeine Nausea Only  . Levofloxacin Rash    Consultations:  Oncology   Procedures/Studies: Ct Chest W Contrast  Result Date: 04/22/2019 CLINICAL DATA:  Chronic lymphocytic leukemia diagnosed 3/19. EXAM: CT CHEST, ABDOMEN, AND PELVIS WITH CONTRAST TECHNIQUE: Multidetector CT imaging of the chest, abdomen and pelvis was performed  following the standard protocol during bolus administration of intravenous contrast. CONTRAST:  34mL OMNIPAQUE IOHEXOL 300 MG/ML  SOLN COMPARISON:  None FINDINGS: CT CHEST FINDINGS Cardiovascular: Normal heart size. No pericardial effusion. Mild aortic atherosclerosis. Lad coronary artery calcification. Mediastinum/Nodes: Heterogeneous appearance of the thyroid gland. The trachea appears patent and is midline. Normal appearance of the esophagus. No enlarged supraclavicular, mediastinal or hilar lymph nodes. No axillary adenopathy. Lungs/Pleura: No pleural effusion. Scarring identified within the anterior left lower lobe. Subpleural nodule in the lateral left lower lobe measures 5 mm, image 87/4. 3 mm subpleural nodule in the right middle lobe noted, image 85/4. 2 mm right upper lobe lung nodule is identified, image 66/4. Musculoskeletal: Scoliosis. No acute or suspicious osseous findings. CT ABDOMEN PELVIS FINDINGS Hepatobiliary: Anterior dome of liver cyst measures approximately 8 mm. Gallbladder unremarkable. No biliary dilatation. Pancreas: Unremarkable. No pancreatic ductal dilatation or surrounding inflammatory changes. Spleen: The spleen measures 10.2 x 5.7 by 13.1 cm (volume = 400 cm^3). Scattered low density foci throughout the splenic parenchyma are identified which measure up to 1.3 cm. Adrenals/Urinary Tract: Normal appearance of the adrenal glands. Bilateral parapelvic cysts identified. No kidney mass or hydronephrosis. There is moderate distension of the urinary bladder. No focal bladder abnormality. Stomach/Bowel: The stomach is normal. The small bowel loops have a normal course and caliber. No abnormal bowel wall thickening, inflammation or distension. Vascular/Lymphatic: Aortic atherosclerosis. Retroperitoneal lymph nodes  within the upper abdomen measure up to 9 mm. No adenopathy. Reproductive: Status post hysterectomy. No adnexal masses. Other: No abdominal wall hernia or abnormality. No  abdominopelvic ascites. Musculoskeletal: Scoliosis and degenerative disc disease. No acute or significant osseous abnormality. IMPRESSION: 1. No adenopathy within the chest, abdomen or pelvis. 2. Splenomegaly with scattered low-density lesions measuring up to 1.3 cm. 3. Small subpleural nodule in the left lower lobe and right middle lobe measuring up to 5 mm. No follow-up needed if patient is low-risk (and has no known or suspected primary neoplasm). Non-contrast chest CT can be considered in 12 months if patient is high-risk. This recommendation follows the consensus statement: Guidelines for Management of Incidental Pulmonary Nodules Detected on CT Images: From the Fleischner Society 2017; Radiology 2017; 284:228-243. 4. Aortic Atherosclerosis (ICD10-I70.0). Coronary artery atherosclerotic calcifications. Electronically Signed   By: Kerby Moors M.D.   On: 04/22/2019 14:32   Ct Abdomen Pelvis W Contrast  Result Date: 04/22/2019 CLINICAL DATA:  Chronic lymphocytic leukemia diagnosed 3/19. EXAM: CT CHEST, ABDOMEN, AND PELVIS WITH CONTRAST TECHNIQUE: Multidetector CT imaging of the chest, abdomen and pelvis was performed following the standard protocol during bolus administration of intravenous contrast. CONTRAST:  11mL OMNIPAQUE IOHEXOL 300 MG/ML  SOLN COMPARISON:  None FINDINGS: CT CHEST FINDINGS Cardiovascular: Normal heart size. No pericardial effusion. Mild aortic atherosclerosis. Lad coronary artery calcification. Mediastinum/Nodes: Heterogeneous appearance of the thyroid gland. The trachea appears patent and is midline. Normal appearance of the esophagus. No enlarged supraclavicular, mediastinal or hilar lymph nodes. No axillary adenopathy. Lungs/Pleura: No pleural effusion. Scarring identified within the anterior left lower lobe. Subpleural nodule in the lateral left lower lobe measures 5 mm, image 87/4. 3 mm subpleural nodule in the right middle lobe noted, image 85/4. 2 mm right upper lobe lung nodule  is identified, image 66/4. Musculoskeletal: Scoliosis. No acute or suspicious osseous findings. CT ABDOMEN PELVIS FINDINGS Hepatobiliary: Anterior dome of liver cyst measures approximately 8 mm. Gallbladder unremarkable. No biliary dilatation. Pancreas: Unremarkable. No pancreatic ductal dilatation or surrounding inflammatory changes. Spleen: The spleen measures 10.2 x 5.7 by 13.1 cm (volume = 400 cm^3). Scattered low density foci throughout the splenic parenchyma are identified which measure up to 1.3 cm. Adrenals/Urinary Tract: Normal appearance of the adrenal glands. Bilateral parapelvic cysts identified. No kidney mass or hydronephrosis. There is moderate distension of the urinary bladder. No focal bladder abnormality. Stomach/Bowel: The stomach is normal. The small bowel loops have a normal course and caliber. No abnormal bowel wall thickening, inflammation or distension. Vascular/Lymphatic: Aortic atherosclerosis. Retroperitoneal lymph nodes within the upper abdomen measure up to 9 mm. No adenopathy. Reproductive: Status post hysterectomy. No adnexal masses. Other: No abdominal wall hernia or abnormality. No abdominopelvic ascites. Musculoskeletal: Scoliosis and degenerative disc disease. No acute or significant osseous abnormality. IMPRESSION: 1. No adenopathy within the chest, abdomen or pelvis. 2. Splenomegaly with scattered low-density lesions measuring up to 1.3 cm. 3. Small subpleural nodule in the left lower lobe and right middle lobe measuring up to 5 mm. No follow-up needed if patient is low-risk (and has no known or suspected primary neoplasm). Non-contrast chest CT can be considered in 12 months if patient is high-risk. This recommendation follows the consensus statement: Guidelines for Management of Incidental Pulmonary Nodules Detected on CT Images: From the Fleischner Society 2017; Radiology 2017; 284:228-243. 4. Aortic Atherosclerosis (ICD10-I70.0). Coronary artery atherosclerotic  calcifications. Electronically Signed   By: Kerby Moors M.D.   On: 04/22/2019 14:32       Subjective:  Patient seen and examined the bedside this morning.  Hemodynamically stable.  Sitting on the chair, does not look in any kind of distress.  Abdominal pain is better today.  Tolerating diet.  Stable for discharge.Called husband and gave update.  Discharge Exam: Vitals:   05/17/19 0607 05/17/19 0621  BP: (!) 167/75 (!) 163/70  Pulse: 72 72  Resp: 17   Temp: 98 F (36.7 C)   SpO2: 100%    Vitals:   05/16/19 1302 05/16/19 2323 05/17/19 0607 05/17/19 0621  BP: 135/68 (!) 143/68 (!) 167/75 (!) 163/70  Pulse: 78 77 72 72  Resp: 18 19 17    Temp: 98.2 F (36.8 C) 98.2 F (36.8 C) 98 F (36.7 C)   TempSrc: Oral Oral Oral   SpO2: 96% 98% 100%   Weight:      Height:        General: Pt is alert, awake, not in acute distress Cardiovascular: RRR, S1/S2 +, no rubs, no gallops Respiratory: CTA bilaterally, no wheezing, no rhonchi Abdominal: Soft, NT, ND, bowel sounds + Extremities: no edema, no cyanosis    The results of significant diagnostics from this hospitalization (including imaging, microbiology, ancillary and laboratory) are listed below for reference.     Microbiology: Recent Results (from the past 240 hour(s))  SARS CORONAVIRUS 2 (TAT 6-24 HRS) Nasopharyngeal Nasopharyngeal Swab     Status: None   Collection Time: 05/14/19  7:00 PM   Specimen: Nasopharyngeal Swab  Result Value Ref Range Status   SARS Coronavirus 2 NEGATIVE NEGATIVE Final    Comment: (NOTE) SARS-CoV-2 target nucleic acids are NOT DETECTED. The SARS-CoV-2 RNA is generally detectable in upper and lower respiratory specimens during the acute phase of infection. Negative results do not preclude SARS-CoV-2 infection, do not rule out co-infections with other pathogens, and should not be used as the sole basis for treatment or other patient management decisions. Negative results must be combined with  clinical observations, patient history, and epidemiological information. The expected result is Negative. Fact Sheet for Patients: SugarRoll.be Fact Sheet for Healthcare Providers: https://www.woods-mathews.com/ This test is not yet approved or cleared by the [place] FDA and  has been authorized for detection and/or diagnosis of SARS-CoV-2 by FDA under an Emergency Use Authorization (EUA). This EUA will remain  in effect (meaning this test can be used) for the duration of the COVID-19 declaration under Section 56 4(b)(1) of the Act, 21 U.S.C. section 360bbb-3(b)(1), unless the authorization is terminated or revoked sooner. Performed at [facility], [place] [address], [place], [place] [address]      Labs: BNP (last 3 results) No results for input(s): BNP in the last 8760 hours. Basic Metabolic Panel: Recent Labs  Lab 05/14/19 1044 05/15/19 0515 05/16/19 0506 05/17/19 0459  NA 135 137 138 136  K 4.1 4.5 4.3 4.1  CL 97* 103 104 100  CO2 25 24 26 28   GLUCOSE 134* 135* 97 111*  BUN 13 13 13 12   CREATININE 0.72 0.61 0.65 0.63  CALCIUM 10.7* 9.3 9.1 9.9   Liver Function Tests: Recent Labs  Lab 05/14/19 1044 05/15/19 0515  AST 45* 26  ALT 74* 52*  ALKPHOS 133* 95  BILITOT 1.8* 1.0  PROT 7.5 6.3*  ALBUMIN 3.8 3.1*   No results for input(s): LIPASE, AMYLASE in the last 168 hours. No results for input(s): AMMONIA in the last 168 hours. CBC: Recent Labs  Lab 05/14/19 1044 05/15/19 0515 05/16/19 0506 05/17/19 0459  WBC 166.2* 135.0*  128.9* 168.8*  NEUTROABS 11.7*  --  5.8 5.8  HGB 11.2* 10.0* 10.0* 10.7*  HCT 36.4 33.6* 34.1* 36.5  MCV 107.4* 110.9* 111.8* 110.9*  PLT 158 141* 159 199   Cardiac Enzymes: No results for input(s): CKTOTAL, CKMB, CKMBINDEX, TROPONINI in the last 168 hours. BNP: Invalid input(s): POCBNP CBG: No results for input(s): GLUCAP in the last 168 hours. D-Dimer No results for input(s):  DDIMER in the last 72 hours. Hgb A1c No results for input(s): HGBA1C in the last 72 hours. Lipid Profile No results for input(s): CHOL, HDL, LDLCALC, TRIG, CHOLHDL, LDLDIRECT in the last 72 hours. Thyroid function studies No results for input(s): TSH, T4TOTAL, T3FREE, THYROIDAB in the last 72 hours.  Invalid input(s): FREET3 Anemia work up Recent Labs    05/16/19 0506  VITAMINB12 522  FOLATE 12.6  FERRITIN 165  TIBC 201*  IRON 48   Urinalysis    Component Value Date/Time   COLORURINE YELLOW 04/01/2015 [place] 04/01/2015 1347   LABSPEC 1.009 04/01/2015 1347   PHURINE 6.5 04/01/2015 1347   GLUCOSEU NEGATIVE 04/01/2015 1347   HGBUR NEGATIVE 04/01/2015 1347   BILIRUBINUR n 01/23/2016 0927   KETONESUR NEGATIVE 04/01/2015 1347   PROTEINUR n 01/23/2016 0927   PROTEINUR NEGATIVE 04/01/2015 1347   UROBILINOGEN 0.2 01/23/2016 0927   UROBILINOGEN 0.2 03/30/2014 1203   NITRITE n 01/23/2016 0927   NITRITE NEGATIVE 04/01/2015 1347   LEUKOCYTESUR Negative 01/23/2016 0927   Sepsis Labs Invalid input(s): PROCALCITONIN,  WBC,  LACTICIDVEN Microbiology Recent Results (from the past 240 hour(s))  SARS CORONAVIRUS 2 (TAT 6-24 HRS) Nasopharyngeal Nasopharyngeal Swab     Status: None   Collection Time: 05/14/19  7:00 PM   Specimen: Nasopharyngeal Swab  Result Value Ref Range Status   SARS Coronavirus 2 NEGATIVE NEGATIVE Final    Comment: (NOTE) SARS-CoV-2 target nucleic acids are NOT DETECTED. The SARS-CoV-2 RNA is generally detectable in upper and lower respiratory specimens during the acute phase of infection. Negative results do not preclude SARS-CoV-2 infection, do not rule out co-infections with other pathogens, and should not be used as the sole basis for treatment or other patient management decisions. Negative results must be combined with clinical observations, patient history, and epidemiological information. The expected result is Negative. Fact Sheet  for Patients: SugarRoll.be Fact Sheet for Healthcare Providers: https://www.woods-mathews.com/ This test is not yet approved or cleared by the [place] FDA and  has been authorized for detection and/or diagnosis of SARS-CoV-2 by FDA under an Emergency Use Authorization (EUA). This EUA will remain  in effect (meaning this test can be used) for the duration of the COVID-19 declaration under Section 56 4(b)(1) of the Act, 21 U.S.C. section 360bbb-3(b)(1), unless the authorization is terminated or revoked sooner. Performed at [facility], [place] [address] [place], [place] [address]     Please note: You were cared for by a hospitalist during your hospital stay. Once you are discharged, your primary care physician will handle any further medical issues. Please note that NO REFILLS for any discharge medications will be authorized once you are discharged, as it is imperative that you return to your primary care physician (or establish a relationship with a primary care physician if you do not have one) for your post hospital discharge needs so that they can reassess your need for medications and monitor your lab values.    Time coordinating discharge: 40 minutes  SIGNED:   Shelly Coss, MD  [facility]  05/17/2019, 9:54 AM Pager [phone]  If 7PM-7AM, please contact night-coverage [email or web address] Password [email or web address]

## 2019-05-17 NOTE — Progress Notes (Signed)
Discharge instructions explained to patient, next medications due were written on discharge papers. April [name] has follow up appointments already scheduled. Patient states Dr. Alvy Bimler told her not to take Acyclovir and Allopurinol, no orders written as such. Patient refused to take them.She was discharged via wheelchair, son was picking her up.

## 2019-05-18 ENCOUNTER — Telehealth: Payer: Self-pay | Admitting: *Deleted

## 2019-05-18 ENCOUNTER — Other Ambulatory Visit: Payer: Self-pay | Admitting: Hematology and Oncology

## 2019-05-18 DIAGNOSIS — C9110 Chronic lymphocytic leukemia of B-cell type not having achieved remission: Secondary | ICD-10-CM

## 2019-05-18 MED ORDER — OXYCODONE-ACETAMINOPHEN 5-325 MG PO TABS: 1.0000 | ORAL_TABLET | ORAL | 0 refills | Status: DC | PRN

## 2019-05-18 NOTE — Telephone Encounter (Signed)
-----   Message from Heath Lark, MD sent at 05/18/2019  7:42 AM EDT ----- Regarding: call her today She just got DC yesterday If she feels ok, please try to resume Ibrutinib I have cancelled her previous appt and sent new schedule to see her next Monday, scheduler will call Continue prednisone 20 mg and her pain medication Please make sure she has enough to last until next Monday

## 2019-05-18 NOTE — Telephone Encounter (Signed)
I sent percocet Remind her that she can alternate compazine with zofran for nausea Once she can eat normal, she can resume Ibrutinib

## 2019-05-18 NOTE — Telephone Encounter (Signed)
Telephone call to patient. She will start the percocet this afternoon. Patient is concerned about restarting Ibrutinib and will hold this until she can meet with Dr. Alvy Bimler on Monday next week. She is not ready to restart this medication. She would like to talk further about treatment and other options first. Patient advised of next appointments. She understands to call this office with any further concerns.

## 2019-05-18 NOTE — Telephone Encounter (Signed)
Patient is not doing very well today. She feels very nauseated at times, the compazine seems to help. She feels worse than she did when she went into the hospital. She is shaky, nauseated. Her pain is better when she takes the Oxycodone but it last about 4 hours and she needs to take it again, the pain comes back very intense especially over her spleen. She has been eating and drinking well. She was taking percocet in the hospital and that helped more than the oxycodone. Can she get [facility] [address]?

## 2019-05-18 NOTE — Telephone Encounter (Signed)
Attempted to contact patient for hospital f/u. No answer.

## 2019-05-19 ENCOUNTER — Other Ambulatory Visit: Payer: Self-pay

## 2019-05-19 ENCOUNTER — Encounter: Payer: Self-pay | Admitting: Gynecology

## 2019-05-19 ENCOUNTER — Telehealth: Payer: Self-pay | Admitting: Hematology and Oncology

## 2019-05-19 ENCOUNTER — Telehealth: Payer: Self-pay | Admitting: *Deleted

## 2019-05-19 ENCOUNTER — Ambulatory Visit: Payer: Medicare Other | Admitting: Gynecology

## 2019-05-19 VITALS — BP 122/78

## 2019-05-19 DIAGNOSIS — N939 Abnormal uterine and vaginal bleeding, unspecified: Secondary | ICD-10-CM | POA: Diagnosis not present

## 2019-05-19 LAB — URINALYSIS, COMPLETE W/RFL CULTURE
Bilirubin Urine: NEGATIVE
Glucose, UA: NEGATIVE
Hyaline Cast: NONE SEEN /LPF
Ketones, ur: NEGATIVE
Leukocyte Esterase: NEGATIVE
Nitrites, Initial: NEGATIVE
Protein, ur: NEGATIVE
Specific Gravity, Urine: 1.02 (ref 1.001–1.03)
pH: 6 (ref 5.0–8.0)

## 2019-05-19 LAB — CULTURE INDICATED

## 2019-05-19 NOTE — Telephone Encounter (Signed)
Called patient regarding new scheduled appointments per 10/19 schedule message, left a voicemail. Calender will be mailed.

## 2019-05-19 NOTE — Patient Instructions (Signed)
Office will contact you with the urine culture results.

## 2019-05-19 NOTE — Telephone Encounter (Signed)
Patient called this morning. She sounds much better and states she is feeling very good today. She has a new concern. Overnight she had some bleeding in her vaginal area. She reports some irritation and not a lot of blood just enough to get on her bed clothing. She is going to call her Gynecologist for an appointment today. Advised patient if she is not able to get in to see them today to call this office back.

## 2019-05-19 NOTE — Progress Notes (Signed)
    SHERLINE FEMRITE 02/28/1935 [id number]        84 y.o.  G0P0 presents with episode of vaginal bleeding last night while going to the bathroom.  She noticed blood while wiping.  No blood in the toilet bowl or in her urine.  No pain.  No issues with hemorrhoids or pain with defecation.  No urinary symptoms such as frequency dysuria urgency.  History of hysterectomy with BSO in the past.  Past medical history,surgical history, problem list, medications, allergies, family history and social history were all reviewed and documented in the EPIC chart.  Directed ROS with pertinent positives and negatives documented in the history of present illness/assessment and plan.  Exam: [name] assistant Vitals:   05/19/19 1042  BP: 122/78   General appearance:  Normal Abdomen soft nontender without masses guarding rebound Pelvic external BUS vagina with atrophic changes.  Small red area right hymenal ring at 9:00.  No active bleeding.  Vaginal discharge normal with no evidence of blood.  Bimanual without masses or tenderness.  Rectal exam is normal.  Assessment/Plan:  84 y.o. G0P0 with history as above.  Her urine analysis does show 3+ blood with 10-20 RBC per high-power field.  Few bacteria.  We will culture and follow-up on this.  If evidence of infection will treat and repeat her urine following this.  Small reddish area of irritation on the hymenal ring may be the source of bleeding also due to wiping when she was going to the bathroom given the fragility of her vaginal mucosa.  The question is if her culture is negative do we send her to urology for evaluation.  Patient is going through a lot right now with her CLL she is not sure at this point she wants a work-up of her bladder.  Will readdress after the urine culture returns.    [name] MD, 11:06 AM 05/19/2019

## 2019-05-20 ENCOUNTER — Telehealth: Payer: Self-pay

## 2019-05-20 NOTE — Telephone Encounter (Signed)
Patient had office visit with you yesterday. She wanted you to know that she found the source of her bleeding. She said that is a blood vessel close to the surface. She said she is hopeful it will "seal back up."  She wanted to tell you "thank you".

## 2019-05-21 ENCOUNTER — Other Ambulatory Visit: Payer: Medicare Other

## 2019-05-25 ENCOUNTER — Inpatient Hospital Stay: Payer: Medicare Other

## 2019-05-25 ENCOUNTER — Other Ambulatory Visit: Payer: Self-pay

## 2019-05-25 ENCOUNTER — Inpatient Hospital Stay: Payer: Medicare Other | Admitting: Hematology and Oncology

## 2019-05-25 ENCOUNTER — Telehealth: Payer: Self-pay | Admitting: *Deleted

## 2019-05-25 ENCOUNTER — Encounter: Payer: Self-pay | Admitting: Hematology and Oncology

## 2019-05-25 DIAGNOSIS — L989 Disorder of the skin and subcutaneous tissue, unspecified: Secondary | ICD-10-CM | POA: Diagnosis not present

## 2019-05-25 DIAGNOSIS — Z79899 Other long term (current) drug therapy: Secondary | ICD-10-CM | POA: Diagnosis not present

## 2019-05-25 DIAGNOSIS — C9110 Chronic lymphocytic leukemia of B-cell type not having achieved remission: Secondary | ICD-10-CM

## 2019-05-25 DIAGNOSIS — G893 Neoplasm related pain (acute) (chronic): Secondary | ICD-10-CM | POA: Diagnosis not present

## 2019-05-25 DIAGNOSIS — Z7189 Other specified counseling: Secondary | ICD-10-CM | POA: Diagnosis not present

## 2019-05-25 DIAGNOSIS — D61818 Other pancytopenia: Secondary | ICD-10-CM | POA: Diagnosis not present

## 2019-05-25 LAB — CBC WITH DIFFERENTIAL/PLATELET
Abs Immature Granulocytes: 0.37 10*3/uL — ABNORMAL HIGH (ref 0.00–0.07)
Basophils Absolute: 0 10*3/uL (ref 0.0–0.1)
Basophils Relative: 0 %
Eosinophils Absolute: 0 10*3/uL (ref 0.0–0.5)
Eosinophils Relative: 0 %
HCT: 37.2 % (ref 36.0–46.0)
Hemoglobin: 11.1 g/dL — ABNORMAL LOW (ref 12.0–15.0)
Immature Granulocytes: 0 %
Lymphocytes Relative: 95 %
Lymphs Abs: 162.6 10*3/uL — ABNORMAL HIGH (ref 0.7–4.0)
MCH: 33 pg (ref 26.0–34.0)
MCHC: 29.8 g/dL — ABNORMAL LOW (ref 30.0–36.0)
MCV: 110.7 fL — ABNORMAL HIGH (ref 80.0–100.0)
Monocytes Absolute: 0.5 10*3/uL (ref 0.1–1.0)
Monocytes Relative: 0 %
Neutro Abs: 8.1 10*3/uL — ABNORMAL HIGH (ref 1.7–7.7)
Neutrophils Relative %: 5 %
Platelets: 210 10*3/uL (ref 150–400)
RBC: 3.36 MIL/uL — ABNORMAL LOW (ref 3.87–5.11)
RDW: 16.7 % — ABNORMAL HIGH (ref 11.5–15.5)
WBC: 171.5 10*3/uL (ref 4.0–10.5)
nRBC: 0 % (ref 0.0–0.2)

## 2019-05-25 LAB — URIC ACID: Uric Acid, Serum: 3.7 mg/dL (ref 2.5–7.1)

## 2019-05-25 LAB — COMPREHENSIVE METABOLIC PANEL
ALT: 25 U/L (ref 0–44)
AST: 20 U/L (ref 15–41)
Albumin: 3.7 g/dL (ref 3.5–5.0)
Alkaline Phosphatase: 88 U/L (ref 38–126)
Anion gap: 10 (ref 5–15)
BUN: 11 mg/dL (ref 8–23)
CO2: 29 mmol/L (ref 22–32)
Calcium: 10.2 mg/dL (ref 8.9–10.3)
Chloride: 101 mmol/L (ref 98–111)
Creatinine, Ser: 0.82 mg/dL (ref 0.44–1.00)
GFR calc Af Amer: >60 mL/min (ref >60)
GFR calc non Af Amer: >60 mL/min (ref >60)
Glucose, Bld: 114 mg/dL — ABNORMAL HIGH (ref 70–99)
Potassium: 3.9 mmol/L (ref 3.5–5.1)
Sodium: 140 mmol/L (ref 135–145)
Total Bilirubin: 0.5 mg/dL (ref 0.3–1.2)
Total Protein: 6.8 g/dL (ref 6.5–8.1)

## 2019-05-25 LAB — LACTATE DEHYDROGENASE: LDH: 168 U/L (ref 98–192)

## 2019-05-25 NOTE — Assessment & Plan Note (Signed)
She had severe, uncontrolled pain related to her splenomegaly With recent introduction of regular, scheduled prednisone along with Percocet as needed, her pain has improved somewhat She will continue the current treatment for now We discussed narcotic refill policy

## 2019-05-25 NOTE — Progress Notes (Signed)
START OFF PATHWAY REGIMEN - Lymphoma and CLL   [id number]:Bendamustine 90 mg/m2 IV D1,2 + Rituximab (IV/SUBQ) D1 q28 Days x 6 Cycles:   Cycle 1: A cycle is 28 days:     Rituximab-xxxx      Bendamustine    Cycles 2 through 6: A cycle is every 28 days:     Rituximab and hyaluronidase human      Bendamustine   **Always confirm dose/schedule in your pharmacy ordering system**  Patient Characteristics: Chronic Lymphocytic Leukemia (CLL), Second Line Disease Type: Chronic Lymphocytic Leukemia (CLL) Disease Type: Not Applicable Disease Type: Not Applicable Line of Therapy: Second Line RAI Stage: IV Intent of Therapy: Non-Curative / Palliative Intent, Discussed with Patient

## 2019-05-25 NOTE — Telephone Encounter (Signed)
Did you see this note?  Just checking before I close encounter.

## 2019-05-25 NOTE — Progress Notes (Signed)
[facility] OFFICE PROGRESS NOTE  Patient Care Team: Isaac Bliss, Rayford Halsted, MD as PCP - General (Internal Medicine)  ASSESSMENT & PLAN:  Chronic lymphocytic leukemia ([place]) I had extensive discussion with the patient about the plan of care She is symptomatic from splenomegaly although they are much improved with regular prednisone treatment She is not willing to restart ibrutinib We reviewed the guidelines I plan to switch her treatment to bendamustine/rituximab combination  We discussed the role of chemotherapy is of palliative intent The decision was made based on publication in the Blood: Randomized trial of bendamustine-rituximab or R-CHOP/R-CVP in first-line treatment of indolent NHL or MCL: the BRIGHT study.  AU [address] Lucianne Lei der [address], [place] BS, [address] M, Kwan YL, Simpson D, Craig M, Kolibaba K, Issa S, [place], [place] DM, Munteanu M, Aram Beecham JM SO  Blood. 2014;123(19):2944.  The chemotherapy consists of   1. Bendamustine at 90 mg/m2 on day 1 & 2 2. Rituximab at 375 mg/m2 on day 1 Each cycle + 28 days  Plan for 6 cycles total with or without Neulasta support  In the international phase III BRIGHT trial, 447 previously untreated patients with advanced stage follicular (n = [id number] patients), mantle cell (n = 74 patients), or other indolent lymphoma were randomly assigned to six cycles of BR according to the same dose and schedule described above or to R-CHOP or R-CVP. BR resulted in similar complete (31 versus 25 percent) and overall (97 versus 91 percent) response rates. BR was associated with higher rates of vomiting and drug hypersensitivity and lower rates of peripheral neuropathy/paresthesia and alopecia. The use of prophylactic antiemetics was not specified in the protocol and was more common among patients assigned to R-CHOP.   We discussed some of the risks, benefits and side-effects of Rituximab with Bendamustine.    Some of the short term side-effects included, though not limited to, risk of fatigue, weight loss, tumor lysis syndrome, risk of allergic reactions, pancytopenia, life-threatening infections, need for transfusions of blood products, nausea, vomiting, change in bowel habits, admission to hospital for various reasons, and risks of death.   Long term side-effects are also discussed including permanent damage to nerve function, chronic fatigue, and rare secondary malignancy including bone marrow disorders.   The patient is aware that the response rates discussed earlier is not guaranteed.    After a long discussion, patient made an informed decision to proceed with the prescribed plan of care.   Patient education material was dispensed Due to her significantly elevated white blood cell count, suspect she would have severe infusion reaction to rituximab I plan to omit rituximab for cycle 1 Given her age, her weight and frail status, I also plan to reduce the dose of bendamustine upfront We will start her on treatment next week I will see her within a week of treatment for toxicity review and supportive care     Cancer associated pain She had severe, uncontrolled pain related to her splenomegaly With recent introduction of regular, scheduled prednisone along with Percocet as needed, her pain has improved somewhat She will continue the current treatment for now We discussed narcotic refill policy  Goals of care, counseling/discussion I have goals of care discussion with the patient The patient understood that treatment goal is palliative intent   Orders Placed This Encounter  Procedures  . Hepatitis B core antibody, IgM    Standing Status:   Future  Standing Expiration Date:   06/28/2020  . Hepatitis B surface antibody,qualitative    Standing Status:   Future    Standing Expiration Date:   06/28/2020  . Hepatitis B surface antigen    Standing Status:   Future    Standing  Expiration Date:   06/28/2020    INTERVAL HISTORY: Please see below for problem oriented charting. She returns for further follow-up and discussion about chemotherapy options Her pain is better controlled although she have troubles sleeping in her bed The referred pain to her shoulder has resolved She denies recent cough, chest pain or shortness of breath The patient denies any recent signs or symptoms of bleeding such as spontaneous epistaxis, hematuria or hematochezia.   SUMMARY OF ONCOLOGIC HISTORY: Oncology History Overview Note  Del 13 q   Chronic lymphocytic leukemia ([place])  03/15/2009 Pathology Results   Case #: [id number]  flow cytometry of peripheral blood comfirmed CLL.   04/25/2017 Pathology Results   FISH panel is positive for deletion 13 q only   04/22/2019 Imaging   Ct scan of the chest, abdomen and pelvis 1. No adenopathy within the chest, abdomen or pelvis. 2. Splenomegaly with scattered low-density lesions measuring up to 1.3 cm. 3. Small subpleural nodule in the left lower lobe and right middle lobe measuring up to 5 mm. No follow-up needed if patient is low-risk (and has no known or suspected primary neoplasm). Non-contrast chest CT can be considered in 12 months if patient is high-risk. This recommendation follows the consensus statement: Guidelines for Management of Incidental Pulmonary Nodules Detected on CT Images: From the Fleischner Society 2017; Radiology 2017; 284:228-243. 4. Aortic Atherosclerosis (ICD10-I70.0). Coronary artery atherosclerotic calcifications.     04/23/2019 Cancer Staging   Staging form: Chronic Lymphocytic Leukemia / Small Lymphocytic Lymphoma, AJCC 8th Edition - Clinical stage from 04/23/2019: Modified Rai Stage IV (Modified Rai risk: High, Lymphocytosis: Present, Adenopathy: Absent, Organomegaly: Present, Anemia: Absent, Thrombocytopenia: Present) - Signed by Heath Lark, MD on 04/23/2019   04/28/2019 -  Chemotherapy   The patient had  Ibrutinib for chemotherapy treatment.     06/01/2019 -  Chemotherapy   The patient had palonosetron (ALOXI) injection 0.25 mg, 0.25 mg, Intravenous,  Once, 0 of 3 cycles bendamustine (BENDEKA) 75 mg in sodium chloride 0.9 % 50 mL (1.4151 mg/mL) chemo infusion, 60 mg/m2 = 75 mg (66.67 % of original dose 90 mg/m2), Intravenous,  Once, 0 of 4 cycles Dose modification: 60 mg/m2 (66.7 % of original dose 90 mg/m2, Cycle 1, Reason: Patient Age) riTUXimab-pvvr (RUXIENCE) 500 mg in sodium chloride 0.9 % 250 mL (1.6667 mg/mL) infusion, 375 mg/m2 = 500 mg, Intravenous,  Once, 0 of 3 cycles  for chemotherapy treatment.      REVIEW OF SYSTEMS:   Constitutional: Denies fevers, chills or abnormal weight loss Eyes: Denies blurriness of vision Ears, nose, mouth, throat, and face: Denies mucositis or sore throat Respiratory: Denies cough, dyspnea or wheezes Cardiovascular: Denies palpitation, chest discomfort or lower extremity swelling Gastrointestinal:  Denies nausea, heartburn or change in bowel habits Skin: Denies abnormal skin rashes Lymphatics: Denies new lymphadenopathy or easy bruising Neurological:Denies numbness, tingling or new weaknesses Behavioral/Psych: Mood is stable, no new changes  All other systems were reviewed with the patient and are negative.  I have reviewed the past medical history, past surgical history, social history and family history with the patient and they are unchanged from previous note.  ALLERGIES:  is allergic to codeine and levofloxacin.  MEDICATIONS:  Current Outpatient  Medications  Medication Sig Dispense Refill  . acyclovir (ZOVIRAX) 400 MG tablet Take 1 tablet (400 mg total) by mouth daily. (Patient not taking: Reported on 05/19/2019) 30 tablet 6  . allopurinol (ZYLOPRIM) 300 MG tablet Take 1 tablet (300 mg total) by mouth daily. (Patient not taking: Reported on 05/19/2019) 30 tablet 0  . amLODipine (NORVASC) 5 MG tablet Take 1 tablet (5 mg total) by mouth  daily. 30 tablet 0  . augmented betamethasone dipropionate (DIPROLENE-AF) 0.05 % cream Apply 1 application topically 2 (two) times daily as needed (flare ups).   2  . calcium-vitamin D (OSCAL WITH D) 500-200 MG-UNIT tablet Take 1 tablet by mouth 2 (two) times daily.    Marland Kitchen ibuprofen (ADVIL) 200 MG tablet Take 200 mg by mouth every 6 (six) hours as needed.      . nystatin cream (MYCOSTATIN) Apply 1 application topically 2 (two) times daily. 30 g 3  . ondansetron (ZOFRAN) 4 MG tablet Take 1 tablet (4 mg total) by mouth every 6 (six) hours as needed for nausea. (Patient not taking: Reported on 05/19/2019) 30 tablet 0  . oxyCODONE-acetaminophen (PERCOCET/ROXICET) 5-325 MG tablet Take 1 tablet by mouth every 4 (four) hours as needed for severe pain. 30 tablet 0  . polyethylene glycol (MIRALAX / GLYCOLAX) 17 g packet Take 17 g by mouth daily as needed for moderate constipation. 14 each 0  . predniSONE (DELTASONE) 20 MG tablet Take 1 tablet (20 mg total) by mouth daily with breakfast. 30 tablet 0  . prochlorperazine (COMPAZINE) 5 MG tablet Take 1 tablet (5 mg total) by mouth every 6 (six) hours as needed for nausea or vomiting. (Patient not taking: Reported on 05/19/2019) 30 tablet 1  . senna-docusate (SENOKOT-S) 8.6-50 MG tablet Take 1 tablet by mouth at bedtime as needed for mild constipation. 30 tablet 0  . terconazole (TERAZOL 7) 0.4 % vaginal cream INSERT ONE APPLICATOR VAGINALLY EVERY 2 WEEKS (Patient not taking: Reported on 05/19/2019) 45 g 3  . triamcinolone ointment (KENALOG) 0.5 % Apply 1 application topically 2 (two) times daily. 30 g 0   No current facility-administered medications for this visit.     PHYSICAL EXAMINATION: ECOG PERFORMANCE STATUS: 1 - Symptomatic but completely ambulatory  Vitals:   05/25/19 1203  BP: (!) 149/71  Pulse: 71  Resp: 18  Temp: 98.7 F (37.1 C)  SpO2: 100%   There were no vitals filed for this visit.  GENERAL:alert, no distress and comfortable.  She  looks thin and cachectic Musculoskeletal:no cyanosis of digits and no clubbing  NEURO: alert & oriented x 3 with fluent speech, no focal motor/sensory deficits  LABORATORY DATA:  I have reviewed the data as listed    Component Value Date/Time   NA 140 05/25/2019 1142   NA 141 04/25/2017 1041   K 3.9 05/25/2019 1142   K 4.6 04/25/2017 1041   CL 101 05/25/2019 1142   CL 104 10/27/2012 1209   CO2 29 05/25/2019 1142   CO2 26 04/25/2017 1041   GLUCOSE 114 (H) 05/25/2019 1142   GLUCOSE 97 04/25/2017 1041   GLUCOSE 104 (H) 10/27/2012 1209   BUN 11 05/25/2019 1142   BUN 13.1 04/25/2017 1041   CREATININE 0.82 05/25/2019 1142   CREATININE 0.8 04/25/2017 1041   CALCIUM 10.2 05/25/2019 1142   CALCIUM 9.8 04/25/2017 1041   PROT 6.8 05/25/2019 1142   PROT 7.5 04/25/2017 1041   ALBUMIN 3.7 05/25/2019 1142   ALBUMIN 4.4 04/25/2017 1041   AST  20 05/25/2019 1142   AST 23 04/25/2017 1041   ALT 25 05/25/2019 1142   ALT 14 04/25/2017 1041   ALKPHOS 88 05/25/2019 1142   ALKPHOS 69 04/25/2017 1041   BILITOT 0.5 05/25/2019 1142   BILITOT 0.95 04/25/2017 1041   GFRNONAA >60 05/25/2019 1142   GFRAA >60 05/25/2019 1142    No results found for: SPEP, UPEP  Lab Results  Component Value Date   WBC 171.5 (HH) 05/25/2019   NEUTROABS 8.1 (H) 05/25/2019   HGB 11.1 (L) 05/25/2019   HCT 37.2 05/25/2019   MCV 110.7 (H) 05/25/2019   PLT 210 05/25/2019      Chemistry      Component Value Date/Time   NA 140 05/25/2019 1142   NA 141 04/25/2017 1041   K 3.9 05/25/2019 1142   K 4.6 04/25/2017 1041   CL 101 05/25/2019 1142   CL 104 10/27/2012 1209   CO2 29 05/25/2019 1142   CO2 26 04/25/2017 1041   BUN 11 05/25/2019 1142   BUN 13.1 04/25/2017 1041   CREATININE 0.82 05/25/2019 1142   CREATININE 0.8 04/25/2017 1041      Component Value Date/Time   CALCIUM 10.2 05/25/2019 1142   CALCIUM 9.8 04/25/2017 1041   ALKPHOS 88 05/25/2019 1142   ALKPHOS 69 04/25/2017 1041   AST 20 05/25/2019 1142    AST 23 04/25/2017 1041   ALT 25 05/25/2019 1142   ALT 14 04/25/2017 1041   BILITOT 0.5 05/25/2019 1142   BILITOT 0.95 04/25/2017 1041       All questions were answered. The patient knows to call the clinic with any problems, questions or concerns. No barriers to learning was detected.  I spent 30 minutes counseling the patient face to face. The total time spent in the appointment was 40 minutes and more than 50% was on counseling and review of test results  Heath Lark, MD 05/25/2019 1:56 PM

## 2019-05-25 NOTE — Telephone Encounter (Signed)
I saw it

## 2019-05-25 NOTE — Assessment & Plan Note (Signed)
I have goals of care discussion with the patient The patient understood that treatment goal is palliative intent

## 2019-05-25 NOTE — Telephone Encounter (Signed)
CRITICAL VALUE STICKER  CRITICAL VALUE: WBC = 171.5.  RECEIVER (on-site recipient of call): Mining engineer, Triage [place].   DATE & TIME NOTIFIED: 05/25/2019 at 1159.   MESSENGER (representative from lab): Pam MT [facility].  MD NOTIFIED: Collaborative nurse Clarise Cruz.  TIME OF NOTIFICATION: 05/25/2019 at 1213.  RESPONSE: None.

## 2019-05-25 NOTE — Assessment & Plan Note (Signed)
I had extensive discussion with the patient about the plan of care She is symptomatic from splenomegaly although they are much improved with regular prednisone treatment She is not willing to restart ibrutinib We reviewed the guidelines I plan to switch her treatment to bendamustine/rituximab combination  We discussed the role of chemotherapy is of palliative intent The decision was made based on publication in the Blood: Randomized trial of bendamustine-rituximab or R-CHOP/R-CVP in first-line treatment of indolent NHL or MCL: the BRIGHT study.  AU [address] Lucianne Lei der [address], [place] BS, [address] M, Kwan YL, Simpson D, Craig M, Kolibaba K, Issa S, [place], [place] DM, Munteanu M, Aram Beecham JM SO  Blood. 2014;123(19):2944.  The chemotherapy consists of   1. Bendamustine at 90 mg/m2 on day 1 & 2 2. Rituximab at 375 mg/m2 on day 1 Each cycle + 28 days  Plan for 6 cycles total with or without Neulasta support  In the international phase III BRIGHT trial, 447 previously untreated patients with advanced stage follicular (n = [id number] patients), mantle cell (n = 74 patients), or other indolent lymphoma were randomly assigned to six cycles of BR according to the same dose and schedule described above or to R-CHOP or R-CVP. BR resulted in similar complete (31 versus 25 percent) and overall (97 versus 91 percent) response rates. BR was associated with higher rates of vomiting and drug hypersensitivity and lower rates of peripheral neuropathy/paresthesia and alopecia. The use of prophylactic antiemetics was not specified in the protocol and was more common among patients assigned to R-CHOP.   We discussed some of the risks, benefits and side-effects of Rituximab with Bendamustine.   Some of the short term side-effects included, though not limited to, risk of fatigue, weight loss, tumor lysis syndrome, risk of allergic reactions, pancytopenia, life-threatening infections,  need for transfusions of blood products, nausea, vomiting, change in bowel habits, admission to hospital for various reasons, and risks of death.   Long term side-effects are also discussed including permanent damage to nerve function, chronic fatigue, and rare secondary malignancy including bone marrow disorders.   The patient is aware that the response rates discussed earlier is not guaranteed.    After a long discussion, patient made an informed decision to proceed with the prescribed plan of care.   Patient education material was dispensed Due to her significantly elevated white blood cell count, suspect she would have severe infusion reaction to rituximab I plan to omit rituximab for cycle 1 Given her age, her weight and frail status, I also plan to reduce the dose of bendamustine upfront We will start her on treatment next week I will see her within a week of treatment for toxicity review and supportive care

## 2019-05-27 ENCOUNTER — Telehealth: Payer: Self-pay

## 2019-05-27 NOTE — Telephone Encounter (Signed)
She called and left a message to call her.  Called back. She needs a refill on O[facility] before the [facility]. Told her I would give the message to Dr. Alvy Bimler in the am. She verbalized understanding.

## 2019-05-28 ENCOUNTER — Other Ambulatory Visit: Payer: Self-pay | Admitting: Hematology and Oncology

## 2019-05-28 MED ORDER — OXYCODONE-ACETAMINOPHEN 5-325 MG PO TABS: 1.0000 | ORAL_TABLET | ORAL | 0 refills | Status: DC | PRN

## 2019-05-28 NOTE — Telephone Encounter (Signed)
Medicine refill sent

## 2019-05-29 ENCOUNTER — Encounter: Payer: Self-pay | Admitting: Hematology and Oncology

## 2019-05-29 ENCOUNTER — Other Ambulatory Visit: Payer: Self-pay

## 2019-05-29 ENCOUNTER — Inpatient Hospital Stay: Payer: Medicare Other

## 2019-05-29 NOTE — Progress Notes (Signed)
Met with patient and spouse in lobby area to introduce myself as Arboriculturist and to offer available resources.  Discussed one-time $63 Engineer, drilling to assist with personal expenses while going through treatment. She states based on guidelines, they are over the income.  Also, discussed possible available copay assistance for treatment drugs if ded/OOP have not been met.  Gave my card if interested in applying and for any additional financial questions or concerns.

## 2019-06-01 ENCOUNTER — Telehealth: Payer: Self-pay

## 2019-06-01 ENCOUNTER — Other Ambulatory Visit: Payer: Self-pay

## 2019-06-01 ENCOUNTER — Inpatient Hospital Stay: Payer: Medicare Other | Attending: Hematology and Oncology

## 2019-06-01 VITALS — BP 148/76 | HR 92 | Temp 98.3°F | Resp 16

## 2019-06-01 DIAGNOSIS — Z5111 Encounter for antineoplastic chemotherapy: Secondary | ICD-10-CM | POA: Diagnosis not present

## 2019-06-01 DIAGNOSIS — Z7189 Other specified counseling: Secondary | ICD-10-CM

## 2019-06-01 DIAGNOSIS — C9110 Chronic lymphocytic leukemia of B-cell type not having achieved remission: Secondary | ICD-10-CM | POA: Insufficient documentation

## 2019-06-01 DIAGNOSIS — G893 Neoplasm related pain (acute) (chronic): Secondary | ICD-10-CM | POA: Diagnosis not present

## 2019-06-01 DIAGNOSIS — M549 Dorsalgia, unspecified: Secondary | ICD-10-CM | POA: Diagnosis not present

## 2019-06-01 DIAGNOSIS — M62838 Other muscle spasm: Secondary | ICD-10-CM | POA: Diagnosis not present

## 2019-06-01 MED ORDER — SODIUM CHLORIDE 0.9 % IV SOLN
Freq: Once | INTRAVENOUS | Status: AC
  Administered 2019-06-01: 15:00:00 via INTRAVENOUS
  Filled 2019-06-01: qty 250

## 2019-06-01 MED ORDER — DEXAMETHASONE SODIUM PHOSPHATE 10 MG/ML IJ SOLN
INTRAMUSCULAR | Status: AC
  Filled 2019-06-01: qty 1

## 2019-06-01 MED ORDER — SODIUM CHLORIDE 0.9 % IV SOLN
60.0000 mg/m2 | Freq: Once | INTRAVENOUS | Status: AC
  Administered 2019-06-01: 75 mg via INTRAVENOUS
  Filled 2019-06-01: qty 3

## 2019-06-01 MED ORDER — PALONOSETRON HCL INJECTION 0.25 MG/5ML
INTRAVENOUS | Status: AC
  Filled 2019-06-01: qty 5

## 2019-06-01 MED ORDER — DEXAMETHASONE SODIUM PHOSPHATE 10 MG/ML IJ SOLN
10.0000 mg | Freq: Once | INTRAMUSCULAR | Status: AC
  Administered 2019-06-01: 10 mg via INTRAVENOUS

## 2019-06-01 MED ORDER — PALONOSETRON HCL INJECTION 0.25 MG/5ML
0.2500 mg | Freq: Once | INTRAVENOUS | Status: AC
  Administered 2019-06-01: 0.25 mg via INTRAVENOUS

## 2019-06-01 NOTE — Patient Instructions (Signed)
[facility] Discharge Instructions for Patients Receiving Chemotherapy  Today you received the following chemotherapy agents: Bendamustine   To help prevent nausea and vomiting after your treatment, we encourage you to take your nausea medication as directed.    If you develop nausea and vomiting that is not controlled by your nausea medication, call the clinic.   BELOW ARE SYMPTOMS THAT SHOULD BE REPORTED IMMEDIATELY:  *FEVER GREATER THAN 100.5 F  *CHILLS WITH OR WITHOUT FEVER  NAUSEA AND VOMITING THAT IS NOT CONTROLLED WITH YOUR NAUSEA MEDICATION  *UNUSUAL SHORTNESS OF BREATH  *UNUSUAL BRUISING OR BLEEDING  TENDERNESS IN MOUTH AND THROAT WITH OR WITHOUT PRESENCE OF ULCERS  *URINARY PROBLEMS  *BOWEL PROBLEMS  UNUSUAL RASH Items with * indicate a potential emergency and should be followed up as soon as possible.  Feel free to call the clinic should you have any questions or concerns. The clinic phone number is [phone].  Please show the [place] at check-in to the Emergency Department and triage nurse.  Bendamustine Injection What is this medicine? BENDAMUSTINE (BEN da MUS teen) is a chemotherapy drug. It is used to treat chronic lymphocytic leukemia and non-Hodgkin lymphoma. This medicine may be used for other purposes; ask your health care provider or pharmacist if you have questions. COMMON BRAND NAME(S): Kristine Royal, Treanda What should I tell my health care provider before I take this medicine? They need to know if you have any of these conditions:  infection (especially a virus infection such as chickenpox, cold sores, or herpes)  kidney disease  liver disease  an unusual or allergic reaction to bendamustine, mannitol, other medicines, foods, dyes, or preservatives  pregnant or trying to get pregnant  breast-feeding How should I use this medicine? This medicine is for infusion into a vein. It is given by a health care  professional in a hospital or clinic setting. Talk to your pediatrician regarding the use of this medicine in children. Special care may be needed. Overdosage: If you think you have taken too much of this medicine contact a poison control center or emergency room at once. NOTE: This medicine is only for you. Do not share this medicine with others. What if I miss a dose? It is important not to miss your dose. Call your doctor or health care professional if you are unable to keep an appointment. What may interact with this medicine? Do not take this medicine with any of the following medications:  clozapine This medicine may also interact with the following medications:  atazanavir  cimetidine  ciprofloxacin  enoxacin  fluvoxamine  medicines for seizures like carbamazepine and phenobarbital  mexiletine  rifampin  tacrine  thiabendazole  zileuton This list may not describe all possible interactions. Give your health care provider a list of all the medicines, herbs, non-prescription drugs, or dietary supplements you use. Also tell them if you smoke, drink alcohol, or use illegal drugs. Some items may interact with your medicine. What should I watch for while using this medicine? This drug may make you feel generally unwell. This is not uncommon, as chemotherapy can affect healthy cells as well as cancer cells. Report any side effects. Continue your course of treatment even though you feel ill unless your doctor tells you to stop. You may need blood work done while you are taking this medicine. Call your doctor or healthcare provider for advice if you get a fever, chills or sore throat, or other symptoms of a cold or flu.  Do not treat yourself. This drug decreases your body's ability to fight infections. Try to avoid being around people who are sick. This medicine may cause serious skin reactions. They can happen weeks to months after starting the medicine. Contact your healthcare  provider right away if you notice fevers or flu-like symptoms with a rash. The rash may be red or purple and then turn into blisters or peeling of the skin. Or, you might notice a red rash with swelling of the face, lips or lymph nodes in your neck or under your arms. This medicine may increase your risk to bruise or bleed. Call your doctor or healthcare provider if you notice any unusual bleeding. Talk to your doctor about your risk of cancer. You may be more at risk for certain types of cancers if you take this medicine. Do not become pregnant while taking this medicine or for at least 6 months after stopping it. Women should inform their doctor if they wish to become pregnant or think they might be pregnant. Men should not father a child while taking this medicine and for at least 3 months after stopping it. There is a potential for serious side effects to an unborn child. Talk to your healthcare provider or pharmacist for more information. Do not breast-feed an infant while taking this medicine or for at least 1 week after stopping it. This medicine may make it more difficult to father a child. You should talk with your doctor or healthcare provider if you are concerned about your fertility. What side effects may I notice from receiving this medicine? Side effects that you should report to your doctor or health care professional as soon as possible:  allergic reactions like skin rash, itching or hives, swelling of the face, lips, or tongue  low blood counts - this medicine may decrease the number of white blood cells, red blood cells and platelets. You may be at increased risk for infections and bleeding.  rash, fever, and swollen lymph nodes  redness, blistering, peeling, or loosening of the skin, including inside the mouth  signs of infection like fever or chills, cough, sore throat, pain or difficulty passing urine  signs of decreased platelets or bleeding like bruising, pinpoint red spots  on the skin, black, tarry stools, blood in the urine  signs of decreased red blood cells like being unusually weak or tired, fainting spells, lightheadedness  signs and symptoms of kidney injury like trouble passing urine or change in the amount of urine  signs and symptoms of liver injury like dark yellow or brown urine; general ill feeling or flu-like symptoms; light-colored stools; loss of appetite; nausea; right upper belly pain; unusually weak or tired; yellowing of the eyes or skin Side effects that usually do not require medical attention (report to your doctor or health care professional if they continue or are bothersome):  constipation  decreased appetite  diarrhea  headache  mouth sores  nausea, vomiting  tiredness This list may not describe all possible side effects. Call your doctor for medical advice about side effects. You may report side effects to FDA at 1-800-FDA-1088. Where should I keep my medicine? This drug is given in a hospital or clinic and will not be stored at home. NOTE: This sheet is a summary. It may not cover all possible information. If you have questions about this medicine, talk to your doctor, pharmacist, or health care provider.  2020 Elsevier/Gold Standard (2018-10-07 10:26:46)

## 2019-06-01 NOTE — Telephone Encounter (Signed)
The pain will not get better until we treat her disease I suggest she increase her prednisone and percocet to 2 tabs each time

## 2019-06-01 NOTE — Telephone Encounter (Signed)
Called and given below message. She verbalized understanding. 

## 2019-06-01 NOTE — Telephone Encounter (Signed)
She called and left a message to call her.  Called back. She is having severe pain in her back that has gotten worse over the weekend. She is taking the Percocet as prescribed. She is worried about treatment today at 3 pm and what she should do about pain.

## 2019-06-02 ENCOUNTER — Other Ambulatory Visit: Payer: Self-pay

## 2019-06-02 ENCOUNTER — Emergency Department (HOSPITAL_COMMUNITY): Payer: Medicare Other

## 2019-06-02 ENCOUNTER — Telehealth: Payer: Self-pay

## 2019-06-02 ENCOUNTER — Inpatient Hospital Stay: Payer: Medicare Other

## 2019-06-02 ENCOUNTER — Encounter (HOSPITAL_COMMUNITY): Payer: Self-pay | Admitting: Emergency Medicine

## 2019-06-02 ENCOUNTER — Emergency Department (HOSPITAL_COMMUNITY)
Admission: EM | Admit: 2019-06-02 | Discharge: 2019-06-03 | Disposition: A | Discharge status: Home / Self Care | Payer: Medicare Other | Attending: Emergency Medicine | Admitting: Emergency Medicine

## 2019-06-02 ENCOUNTER — Telehealth: Payer: Self-pay | Admitting: *Deleted

## 2019-06-02 VITALS — BP 155/76 | HR 95 | Resp 16 | Wt 103.5 lb

## 2019-06-02 DIAGNOSIS — Y999 Unspecified external cause status: Secondary | ICD-10-CM | POA: Insufficient documentation

## 2019-06-02 DIAGNOSIS — I10 Essential (primary) hypertension: Secondary | ICD-10-CM | POA: Insufficient documentation

## 2019-06-02 DIAGNOSIS — W010XXA Fall on same level from slipping, tripping and stumbling without subsequent striking against object, initial encounter: Secondary | ICD-10-CM | POA: Diagnosis not present

## 2019-06-02 DIAGNOSIS — Z79899 Other long term (current) drug therapy: Secondary | ICD-10-CM | POA: Insufficient documentation

## 2019-06-02 DIAGNOSIS — S22000A Wedge compression fracture of unspecified thoracic vertebra, initial encounter for closed fracture: Secondary | ICD-10-CM

## 2019-06-02 DIAGNOSIS — S22060A Wedge compression fracture of T7-T8 vertebra, initial encounter for closed fracture: Secondary | ICD-10-CM | POA: Diagnosis not present

## 2019-06-02 DIAGNOSIS — Y9289 Other specified places as the place of occurrence of the external cause: Secondary | ICD-10-CM | POA: Insufficient documentation

## 2019-06-02 DIAGNOSIS — Y9301 Activity, walking, marching and hiking: Secondary | ICD-10-CM | POA: Diagnosis not present

## 2019-06-02 DIAGNOSIS — Z7189 Other specified counseling: Secondary | ICD-10-CM

## 2019-06-02 DIAGNOSIS — S299XXA Unspecified injury of thorax, initial encounter: Secondary | ICD-10-CM | POA: Diagnosis present

## 2019-06-02 DIAGNOSIS — W19XXXA Unspecified fall, initial encounter: Secondary | ICD-10-CM

## 2019-06-02 DIAGNOSIS — C9110 Chronic lymphocytic leukemia of B-cell type not having achieved remission: Secondary | ICD-10-CM | POA: Insufficient documentation

## 2019-06-02 DIAGNOSIS — Z87891 Personal history of nicotine dependence: Secondary | ICD-10-CM | POA: Insufficient documentation

## 2019-06-02 LAB — CBC WITH DIFFERENTIAL/PLATELET
Abs Immature Granulocytes: 0.5 10*3/uL — ABNORMAL HIGH (ref 0.00–0.07)
Basophils Absolute: 0.1 10*3/uL (ref 0.0–0.1)
Basophils Relative: 0 %
Eosinophils Absolute: 0 10*3/uL (ref 0.0–0.5)
Eosinophils Relative: 0 %
HCT: 35.9 % — ABNORMAL LOW (ref 36.0–46.0)
Hemoglobin: 10.8 g/dL — ABNORMAL LOW (ref 12.0–15.0)
Immature Granulocytes: 0 %
Lymphocytes Relative: 93 %
Lymphs Abs: 144.9 10*3/uL — ABNORMAL HIGH (ref 0.7–4.0)
MCH: 33.2 pg (ref 26.0–34.0)
MCHC: 30.1 g/dL (ref 30.0–36.0)
MCV: 110.5 fL — ABNORMAL HIGH (ref 80.0–100.0)
Monocytes Absolute: 1.1 10*3/uL — ABNORMAL HIGH (ref 0.1–1.0)
Monocytes Relative: 1 %
Neutro Abs: 9.1 10*3/uL — ABNORMAL HIGH (ref 1.7–7.7)
Neutrophils Relative %: 6 %
Platelets: 174 10*3/uL (ref 150–400)
RBC: 3.25 MIL/uL — ABNORMAL LOW (ref 3.87–5.11)
RDW: 16 % — ABNORMAL HIGH (ref 11.5–15.5)
WBC: 155.7 10*3/uL (ref 4.0–10.5)
nRBC: 0 % (ref 0.0–0.2)

## 2019-06-02 LAB — BASIC METABOLIC PANEL
Anion gap: 8 (ref 5–15)
BUN: 14 mg/dL (ref 8–23)
CO2: 25 mmol/L (ref 22–32)
Calcium: 9.2 mg/dL (ref 8.9–10.3)
Chloride: 103 mmol/L (ref 98–111)
Creatinine, Ser: 0.69 mg/dL (ref 0.44–1.00)
GFR calc Af Amer: >60 mL/min (ref >60)
GFR calc non Af Amer: >60 mL/min (ref >60)
Glucose, Bld: 159 mg/dL — ABNORMAL HIGH (ref 70–99)
Potassium: 5 mmol/L (ref 3.5–5.1)
Sodium: 136 mmol/L (ref 135–145)

## 2019-06-02 MED ORDER — SODIUM CHLORIDE 0.9 % IV SOLN
Freq: Once | INTRAVENOUS | Status: AC
  Administered 2019-06-02: 16:00:00 via INTRAVENOUS
  Filled 2019-06-02: qty 250

## 2019-06-02 MED ORDER — OXYCODONE-ACETAMINOPHEN 5-325 MG PO TABS
1.0000 | ORAL_TABLET | Freq: Once | ORAL | Status: AC
  Administered 2019-06-02: 1 via ORAL
  Filled 2019-06-02: qty 1

## 2019-06-02 MED ORDER — DEXAMETHASONE SODIUM PHOSPHATE 10 MG/ML IJ SOLN
INTRAMUSCULAR | Status: AC
  Filled 2019-06-02: qty 1

## 2019-06-02 MED ORDER — SODIUM CHLORIDE 0.9 % IV SOLN: 60.0000 mg/m2 | Freq: Once | INTRAVENOUS | Status: DC

## 2019-06-02 MED ORDER — IOHEXOL 300 MG/ML  SOLN
100.0000 mL | Freq: Once | INTRAMUSCULAR | Status: AC | PRN
  Administered 2019-06-02: 23:00:00 100 mL via INTRAVENOUS

## 2019-06-02 MED ORDER — SODIUM CHLORIDE 0.9 % IV SOLN
60.0000 mg/m2 | Freq: Once | INTRAVENOUS | Status: AC
  Administered 2019-06-02: 17:00:00 75 mg via INTRAVENOUS
  Filled 2019-06-02: qty 3

## 2019-06-02 MED ORDER — HYDROMORPHONE HCL 1 MG/ML IJ SOLN
0.5000 mg | Freq: Once | INTRAMUSCULAR | Status: AC
  Administered 2019-06-02: 0.5 mg via INTRAVENOUS
  Filled 2019-06-02: qty 1

## 2019-06-02 MED ORDER — DEXAMETHASONE SODIUM PHOSPHATE 10 MG/ML IJ SOLN
10.0000 mg | Freq: Once | INTRAMUSCULAR | Status: AC
  Administered 2019-06-02: 16:00:00 10 mg via INTRAVENOUS

## 2019-06-02 MED ORDER — ALLOPURINOL 300 MG PO TABS: 300.0000 mg | ORAL_TABLET | Freq: Every day | ORAL | 3 refills | Status: DC

## 2019-06-02 MED FILL — ALLOPURINOL 300 MG TABS: 300 | 30 days supply | Qty: 30 | Fill #0

## 2019-06-02 NOTE — Telephone Encounter (Signed)
Planned to do chemo f/u call but noted pt is in ED & looks like being admitted.  Tried pt's mobile # & left message to call back.

## 2019-06-02 NOTE — ED Provider Notes (Signed)
[facility] DEPT Provider Note   CSN: [id number] Arrival date & time: 06/02/19  1734     History   Chief Complaint Chief Complaint  Patient presents with  . Fall  . Torso pain    HPI April [name] is a 84 y.o. female.  Presents to ER after fall.  Reports that she was at the cancer center walking around outside waiting for her husband when she fell, unsure if she had a syncopal episode.  She is hurting predominantly on her left side, but also noted some bruising on her right upper arm.  Pain is primarily in her left chest wall, left side of her abdomen.  Dull, ache, nonradiating.  Currently undergoing chemotherapy for CLL.     HPI  Past Medical History:  Diagnosis Date  . Breast CA ([place])   . Bronchitis, chronic ([place])   . Eye hemorrhage, left   . Leukemia, chronic lymphoid ([place])   . 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 Osteoporosis 05/2018   T score -3.1 overall stable from prior study    Patient Active Problem List   Diagnosis Date Noted  . Goals of care, counseling/discussion 05/14/2019  . Abnormal liver enzymes 05/14/2019  . Dehydration 05/14/2019  . Failure to thrive in adult 05/14/2019  . Abdominal pain 04/16/2019  . Cancer associated pain 04/16/2019  . Breast cancer ([place]) 06/19/2018  . Skin lesion 05/06/2017  . Essential hypertension 12/14/2016  . Systolic hypertension, isolated 11/16/2016  . Central retinal vein occlusion 10/18/2016  . Preventive measure 04/28/2015  . Thrombocytopenia ([place]) 04/28/2015  . Idiopathic scoliosis 10/23/2012  . Chronic lymphocytic leukemia ([place]) 03/02/2009  . VENOUS INSUFFICIENCY, CHRONIC 10/28/2008  . Dyslipidemia 10/27/2007  . Osteoporosis 10/27/2007  . SKIN CANCER, HX OF 10/27/2007  . DIVERTICULITIS, HX OF 04/08/2007    Past Surgical History:  Procedure Laterality Date  . ABDOMINAL HYSTERECTOMY  1980  . BREAST IMPLANTS REMOVED  2001  . BREAST SURGERY     Bilateral mastectomy  . MASTECTOMY  1982   BILATERAL  . OOPHORECTOMY     BSO  . PARTIAL COLECTOMY     INTESTINAL ABSCESS WITH SALPINGECTOMY  . RECONSTRUCTION LEFT ELBOW    . TONSILLECTOMY    . UMBILLICAL HERNIA REPAIR       OB History    Gravida  0   Para  0   Term      Preterm      AB      Living        SAB      TAB      Ectopic      Multiple      Live Births               Home Medications    Prior to Admission medications   Medication Sig Start Date End Date Taking? Authorizing Provider  allopurinol (ZYLOPRIM) 300 MG tablet Take 1 tablet (300 mg total) by mouth daily. 06/02/19  Yes Gorsuch, Ni, MD  amLODipine (NORVASC) 5 MG tablet Take 1 tablet (5 mg total) by mouth daily. 05/17/19  Yes Shelly Coss, MD  calcium-vitamin D (OSCAL WITH D) 500-200 MG-UNIT tablet Take 1 tablet by mouth 2 (two) times daily.   Yes [provider]  nystatin cream (MYCOSTATIN) Apply 1 application topically 2 (two) times daily. 04/09/17  Yes Fontaine, Belinda Block, MD  oxyCODONE-acetaminophen (PERCOCET/ROXICET) 5-325 MG tablet Take 1 tablet by mouth every 4 (four)  hours as needed for severe pain. 05/28/19  Yes Gorsuch, Ni, MD  predniSONE (DELTASONE) 20 MG tablet Take 1 tablet (20 mg total) by mouth daily with breakfast. 05/17/19  Yes Adhikari, Amrit, MD  acyclovir (ZOVIRAX) 400 MG tablet Take 1 tablet (400 mg total) by mouth daily. Patient not taking: Reported on 05/19/2019 04/23/19   Heath Lark, MD  augmented betamethasone dipropionate (DIPROLENE-AF) 0.05 % cream Apply 1 application topically 2 (two) times daily as needed (flare ups).  05/08/18   [provider]  ibuprofen (ADVIL) 200 MG tablet Take 200 mg by mouth every 6 (six) hours as needed.      [provider]  ondansetron (ZOFRAN) 4 MG tablet Take 1 tablet (4 mg total) by mouth every 6 (six) hours as needed for nausea. Patient not taking: Reported on 05/19/2019 05/01/19   Heath Lark, MD  polyethylene  glycol (MIRALAX / GLYCOLAX) 17 g packet Take 17 g by mouth daily as needed for moderate constipation. 05/17/19   Shelly Coss, MD  prochlorperazine (COMPAZINE) 5 MG tablet Take 1 tablet (5 mg total) by mouth every 6 (six) hours as needed for nausea or vomiting. Patient not taking: Reported on 05/19/2019 04/23/19   Heath Lark, MD  senna-docusate (SENOKOT-S) 8.6-50 MG tablet Take 1 tablet by mouth at bedtime as needed for mild constipation. 05/17/19   Shelly Coss, MD  terconazole (TERAZOL 7) 0.4 % vaginal cream INSERT ONE APPLICATOR VAGINALLY EVERY 2 WEEKS Patient not taking: Reported on 05/19/2019 04/09/19   Fontaine, Belinda Block, MD  triamcinolone ointment (KENALOG) 0.5 % Apply 1 application topically 2 (two) times daily. 05/06/17   Heath Lark, MD    Family History Family History  Problem Relation Age of Onset  . Cancer Brother        HODGKINS  . Cancer Brother        leukemia  . Parkinson's disease Brother   . Heart disease Mother   . Heart disease Sister     Social History Social History   Tobacco Use  . Smoking status: Former Smoker    Types: Cigarettes    Quit date: 03/26/1955    Years since quitting: 64.2  . Smokeless tobacco: Never Used  Substance Use Topics  . Alcohol use: Yes    Alcohol/week: 5.0 standard drinks    Types: 5 Standard drinks or equivalent per week    Comment: wine  . Drug use: No     Allergies   Codeine and Levofloxacin   Review of Systems Review of Systems  Constitutional: Negative for chills and fever.  HENT: Negative for ear pain and sore throat.   Eyes: Negative for pain and visual disturbance.  Respiratory: Positive for shortness of breath. Negative for cough.   Cardiovascular: Positive for chest pain. Negative for palpitations.  Gastrointestinal: Positive for abdominal pain. Negative for vomiting.  Genitourinary: Negative for dysuria and hematuria.  Musculoskeletal: Negative for arthralgias and back pain.  Skin: Negative for color  change and rash.  Neurological: Negative for seizures and syncope.  All other systems reviewed and are negative.    Physical Exam Updated Vital Signs BP (!) 168/104   Pulse 94   Temp 98.3 F (36.8 C) (Oral)   Resp (!) 22   LMP 03/26/1979   SpO2 97%   Physical Exam Vitals signs and nursing note reviewed.  Constitutional:      General: She is not in acute distress.    Appearance: She is well-developed.  HENT:  Head: Normocephalic and atraumatic.  Eyes:     Conjunctiva/sclera: Conjunctivae normal.  Neck:     Musculoskeletal: Neck supple.  Cardiovascular:     Rate and Rhythm: Normal rate and regular rhythm.     Heart sounds: No murmur.  Pulmonary:     Effort: Pulmonary effort is normal. No respiratory distress.     Breath sounds: Normal breath sounds.  Abdominal:     Palpations: Abdomen is soft.     Tenderness: There is no abdominal tenderness.  Musculoskeletal:     Comments: TTP in right upper arm, small ecchymosis noted over upper arm, distal sensation and cap refill intact LUE: No TTP throughout extremity, no deformity RLE: No TTP throughout extremity, no deformity LLE: No TTP throughout extremity, no deformity Back: no C, T, L spine TTP  Skin:    General: Skin is warm and dry.     Capillary Refill: Capillary refill takes less than 2 seconds.  Neurological:     General: No focal deficit present.     Mental Status: She is alert and oriented to person, place, and time.  Psychiatric:        Mood and Affect: Mood normal.        Behavior: Behavior normal.      ED Treatments / Results  Labs (all labs ordered are listed, but only abnormal results are displayed) Labs Reviewed - No data to display  EKG None  Radiology No results found.  Procedures Procedures (including critical care time)  Medications Ordered in ED Medications - No data to display   Initial Impression / Assessment and Plan / ED Course  I have reviewed the triage vital signs and the  nursing notes.  Pertinent labs & imaging results that were available during my care of the patient were reviewed by me and considered in my medical decision making (see chart for details).        84 year old presented to ER after fall.  Feels well currently on chemotherapy.  Noted to have tenderness over her left anterior chest wall, left flank.  Obtain CT imaging to further evaluate.  Demonstrated new T8 compression deformity with 30% height loss but otherwise no acute traumatic changes.  Patient had normal vital signs, pain was controlled in ER.  If she is appropriate for outpatient management at this time.  Incidentally noted on CT imaging is mild biliary duct dilatation.  Patient has no tenderness palpation in right upper quadrant, no right-sided abdominal pain.  Do not feel further investigation needed at this time given clinical picture.  Strict return precautions with patient, recommend close follow-up with either PCP or oncology.  Will discharge home.    After the discussed management above, the patient was determined to be safe for discharge.  The patient was in agreement with this plan and all questions regarding their care were answered.  ED return precautions were discussed and the patient will return to the ED with any significant worsening of condition.   Final Clinical Impressions(s) / ED Diagnoses   Final diagnoses:  CLL (chronic lymphocytic leukemia) ([place])  Fall, initial encounter  Compression fracture of thoracic vertebra, initial encounter, unspecified thoracic vertebral level [facility])    ED Discharge Orders    None       Lucrezia Starch, MD 06/03/19 [phone]

## 2019-06-02 NOTE — ED Triage Notes (Signed)
Pt reports she finished her chemo treatment today at [facility] and was walking around outside at [place] area waiting for her husband to pick her up. When she stepped onto paver she fell. Reports fell backwards and hit the patio furniture. Reports that she didn't hit her head. Unsure if had LOC. C/o of torso pains.

## 2019-06-02 NOTE — Patient Instructions (Signed)
[facility] Discharge Instructions for Patients Receiving Chemotherapy  Today you received the following chemotherapy agent: Bendeka  To help prevent nausea and vomiting after your treatment, we encourage you to take your nausea medication as directed by your MD.   If you develop nausea and vomiting that is not controlled by your nausea medication, call the clinic.   BELOW ARE SYMPTOMS THAT SHOULD BE REPORTED IMMEDIATELY:  *FEVER GREATER THAN 100.5 F  *CHILLS WITH OR WITHOUT FEVER  NAUSEA AND VOMITING THAT IS NOT CONTROLLED WITH YOUR NAUSEA MEDICATION  *UNUSUAL SHORTNESS OF BREATH  *UNUSUAL BRUISING OR BLEEDING  TENDERNESS IN MOUTH AND THROAT WITH OR WITHOUT PRESENCE OF ULCERS  *URINARY PROBLEMS  *BOWEL PROBLEMS  UNUSUAL RASH Items with * indicate a potential emergency and should be followed up as soon as possible.  Feel free to call the clinic should you have any questions or concerns. The clinic phone number is [phone].   Constipation, Adult Constipation is when a person:  Poops (has a bowel movement) fewer times in a week than normal.  Has a hard time pooping.  Has poop that is dry, hard, or bigger than normal. Follow these instructions at home: Eating and drinking   Eat foods that have a lot of fiber, such as: ? Fresh fruits and vegetables. ? Whole grains. ? Beans.  Eat less of foods that are high in fat, low in fiber, or overly processed, such as: ? [place] fries. ? Hamburgers. ? Cookies. ? Candy. ? Soda.  Drink enough fluid to keep your pee (urine) clear or pale yellow. General instructions  Exercise regularly or as told by your doctor.  Go to the restroom when you feel like you need to poop. Do not hold it in.  Take over-the-counter and prescription medicines only as told by your doctor. These include any fiber supplements.  Do pelvic floor retraining exercises, such as: ? Doing deep breathing while relaxing your lower  belly (abdomen). ? Relaxing your pelvic floor while pooping.  Watch your condition for any changes.  Keep all follow-up visits as told by your doctor. This is important. Contact a doctor if:  You have pain that gets worse.  You have a fever.  You have not pooped for 4 days.  You throw up (vomit).  You are not hungry.  You lose weight.  You are bleeding from the anus.  You have thin, pencil-like poop (stool). Get help right away if:  You have a fever, and your symptoms suddenly get worse.  You leak poop or have blood in your poop.  Your belly feels hard or bigger than normal (is bloated).  You have very bad belly pain.  You feel dizzy or you faint. This information is not intended to replace advice given to you by your health care provider. Make sure you discuss any questions you have with your health care provider. Document Released: 01/02/2008 Document Revised: 06/28/2017 Document Reviewed: 01/04/2016 Elsevier Patient Education  [place].   Please show the [place] at check-in to the Emergency Department and triage nurse. Coronavirus (COVID-19) Are you at risk?  Are you at risk for the Coronavirus (COVID-19)?  To be considered HIGH RISK for Coronavirus (COVID-19), you have to meet the following criteria:  . Traveled to [place], [place], [place], [place] or [place]; or in the [place] to [place], [place], [place], or [place]; and have fever, cough, and shortness of breath within the last 2 weeks  of travel OR . Been in close contact with a person diagnosed with COVID-19 within the last 2 weeks and have fever, cough, and shortness of breath . IF YOU DO NOT MEET THESE CRITERIA, YOU ARE CONSIDERED LOW RISK FOR COVID-19.  What to do if you are HIGH RISK for COVID-19?  Marland Kitchen If you are having a medical emergency, call 911. . Seek medical care right away. Before you go to a doctor's office, urgent care or emergency department, call ahead and tell  them about your recent travel, contact with someone diagnosed with COVID-19, and your symptoms. You should receive instructions from your physician's office regarding next steps of care.  . When you arrive at healthcare provider, tell the healthcare staff immediately you have returned from visiting [place], [place], [place], [place] or [place]; or traveled in the [place] to [employer], [place], [name], or [place]; in the last two weeks or you have been in close contact with a person diagnosed with COVID-19 in the last 2 weeks.   . Tell the health care staff about your symptoms: fever, cough and shortness of breath. . After you have been seen by a medical provider, you will be either: o Tested for (COVID-19) and discharged home on quarantine except to seek medical care if symptoms worsen, and asked to  - Stay home and avoid contact with others until you get your results (4-5 days)  - Avoid travel on public transportation if possible (such as bus, train, or airplane) or o Sent to the Emergency Department by EMS for evaluation, COVID-19 testing, and possible admission depending on your condition and test results.  What to do if you are LOW RISK for COVID-19?  Reduce your risk of any infection by using the same precautions used for avoiding the common cold or flu:  Marland Kitchen Wash your hands often with soap and warm water for at least 20 seconds.  If soap and water are not readily available, use an alcohol-based hand sanitizer with at least 60% alcohol.  . If coughing or sneezing, cover your mouth and nose by coughing or sneezing into the elbow areas of your shirt or coat, into a tissue or into your sleeve (not your hands). . Avoid shaking hands with others and consider head nods or verbal greetings only. . Avoid touching your eyes, nose, or mouth with unwashed hands.  . Avoid close contact with people who are sick. . Avoid places or events with large numbers of people in one location, like concerts or  sporting events. . Carefully consider travel plans you have or are making. . If you are planning any travel outside or inside the [place], visit the CDC's Travelers' Health webpage for the latest health notices. . If you have some symptoms but not all symptoms, continue to monitor at home and seek medical attention if your symptoms worsen. . If you are having a medical emergency, call 911.   [place] / e-Visit: [email or web address]         [facility]: [facility]: [id number]                   [facility]: [phone]

## 2019-06-02 NOTE — Telephone Encounter (Signed)
-----   Message from Lillia Corporal, RN sent at 06/02/2019  8:32 AM EST ----- Regarding: Dr. Alvy Bimler- first time chemo New chemo patient- bendamustine

## 2019-06-02 NOTE — ED Notes (Signed)
CRITICAL VALUE STICKER  CRITICAL VALUE: WBC 155.7  RECEIVER (on-site recipient of call): Cabela Pacifico RN   [place] NOTIFIED: 06/02/19 2332  MESSENGER (representative from lab):  MD NOTIFIED: Dykstra MD  [place]: [phone]

## 2019-06-02 NOTE — Telephone Encounter (Signed)
Called and instructed to continue taking allopurinol as prescribed, per Dr. Alvy Bimler. Rx sent to pharmacy. She verbalized understanding.

## 2019-06-03 MED ORDER — HYDROMORPHONE HCL 1 MG/ML IJ SOLN
0.5000 mg | Freq: Once | INTRAMUSCULAR | Status: AC
  Administered 2019-06-03: 01:00:00 0.5 mg via INTRAVENOUS
  Filled 2019-06-03: qty 1

## 2019-06-03 NOTE — Discharge Instructions (Addendum)
Continue pain medicine as previously prescribed.  Recommend recheck with either your primary doctor or your oncologist within the next 48 hours.  If you have additional falls, any episodes of passing out or other new concerning symptom, please return to ER for reassessment.

## 2019-06-04 ENCOUNTER — Inpatient Hospital Stay: Payer: Medicare Other | Admitting: Internal Medicine

## 2019-06-04 ENCOUNTER — Telehealth: Payer: Self-pay

## 2019-06-04 ENCOUNTER — Other Ambulatory Visit: Payer: Self-pay

## 2019-06-04 ENCOUNTER — Telehealth (INDEPENDENT_AMBULATORY_CARE_PROVIDER_SITE_OTHER): Payer: Medicare Other | Admitting: Internal Medicine

## 2019-06-04 ENCOUNTER — Other Ambulatory Visit: Payer: Self-pay | Admitting: Hematology and Oncology

## 2019-06-04 DIAGNOSIS — T402X5A Adverse effect of other opioids, initial encounter: Secondary | ICD-10-CM

## 2019-06-04 DIAGNOSIS — C9110 Chronic lymphocytic leukemia of B-cell type not having achieved remission: Secondary | ICD-10-CM

## 2019-06-04 DIAGNOSIS — G893 Neoplasm related pain (acute) (chronic): Secondary | ICD-10-CM

## 2019-06-04 DIAGNOSIS — R52 Pain, unspecified: Secondary | ICD-10-CM

## 2019-06-04 DIAGNOSIS — K5903 Drug induced constipation: Secondary | ICD-10-CM

## 2019-06-04 DIAGNOSIS — S22060D Wedge compression fracture of T7-T8 vertebra, subsequent encounter for fracture with routine healing: Secondary | ICD-10-CM

## 2019-06-04 MED ORDER — OXYCODONE-ACETAMINOPHEN 10-325 MG PO TABS: 1.0000 | ORAL_TABLET | ORAL | 0 refills | Status: DC | PRN

## 2019-06-04 NOTE — Telephone Encounter (Signed)
Is she almost out of her pain medication? If she is taking 2 each time, I suggest re-writing her prescription at increased dose but please make sure she understand  She has new fracture from the fall; she may need a referral to spine surgeon/vertebroplasty. Can she ask her primary doctor for referral?

## 2019-06-04 NOTE — Telephone Encounter (Signed)
Called and given below message. She verbalized understanding.  She is taking 2 percocet's every 4 hours for pain. Last 2 at 0800. Pain is rated at a 10 in the left side of lower back. Pharmacy is [facility] on [address]. She has not had bm in 1 week, she is passing gas. She has increased fiber in diet and drank prune juice. She took a dose of Miralax yesterday. Instructed to take Miralax BID. She verbalized understanding.

## 2019-06-04 NOTE — Telephone Encounter (Signed)
Called and given below message. She verbalized understanding. She will start taking 2 percocet's every 4 hours for pain. She does need a new Rx. She will ask PCP for a referral when talks to them today. Instructed to call office if needed.

## 2019-06-04 NOTE — Telephone Encounter (Signed)
She called back. Given below message. She verbalized understanding. She is still having a lot of pain. Instructed to call back in the am with update on pain and if pain becomes intolerable to go to ED ti be evaluated, She verbalized understanding.

## 2019-06-04 NOTE — Telephone Encounter (Signed)
Called and left a message asking her to call the office. 

## 2019-06-04 NOTE — Progress Notes (Signed)
Virtual Visit via Telephone Note  I connected with April [name] on 06/04/19 at  4:00 PM EST by telephone and verified that I am speaking with the correct person using two identifiers.   I discussed the limitations, risks, security and privacy concerns of performing an evaluation and management service by telephone and the availability of in person appointments. I also discussed with the patient that there may be a patient responsible charge related to this service. The patient expressed understanding and agreed to proceed.  Location patient: home Location provider: work office Participants present for the call: patient, provider Patient did not have a visit in the prior 7 days to address this/these issue(s).   History of Present Illness:  She has scheduled this appointment due to severe, intractable pain. Her CLL has become active and she has been undergoing therapy with Dr. Alvy Bimler. She fell on 11/3 and went to ED where CT scan showed a new T8 compression fracture. She was also hospitalized in October due to intractable pain presumably due to splenomegaly from CLL. She called the cancer center and a Rx for Oxycodone 10 mg to take every 4 hours was called in today. She states pain is so severe it takes her breath away. She is unable to get out of a chair. Wonders what she can do next. She is also constipated. Took Miralax this am; still no relief.   Observations/Objective: Patient sounds bleak and in pain on the phone. I do not appreciate any increased work of breathing. Speech and thought processing are grossly intact. Patient reported vitals: none reported   Current Outpatient Medications:  .  acyclovir (ZOVIRAX) 400 MG tablet, Take 1 tablet (400 mg total) by mouth daily. (Patient not taking: Reported on 05/19/2019), Disp: 30 tablet, Rfl: 6 .  allopurinol (ZYLOPRIM) 300 MG tablet, Take 1 tablet (300 mg total) by mouth daily., Disp: 30 tablet, Rfl: 3 .  amLODipine (NORVASC) 5 MG  tablet, Take 1 tablet (5 mg total) by mouth daily., Disp: 30 tablet, Rfl: 0 .  augmented betamethasone dipropionate (DIPROLENE-AF) 0.05 % cream, Apply 1 application topically 2 (two) times daily as needed (flare ups). , Disp: , Rfl: 2 .  calcium-vitamin D (OSCAL WITH D) 500-200 MG-UNIT tablet, Take 1 tablet by mouth 2 (two) times daily., Disp: , Rfl:  .  ibuprofen (ADVIL) 200 MG tablet, Take 200 mg by mouth every 6 (six) hours as needed.  , Disp: , Rfl:  .  nystatin cream (MYCOSTATIN), Apply 1 application topically 2 (two) times daily., Disp: 30 g, Rfl: 3 .  ondansetron (ZOFRAN) 4 MG tablet, Take 1 tablet (4 mg total) by mouth every 6 (six) hours as needed for nausea. (Patient not taking: Reported on 05/19/2019), Disp: 30 tablet, Rfl: 0 .  oxyCODONE-acetaminophen (PERCOCET) 10-325 MG tablet, Take 1 tablet by mouth every 4 (four) hours as needed for pain., Disp: 60 tablet, Rfl: 0 .  polyethylene glycol (MIRALAX / GLYCOLAX) 17 g packet, Take 17 g by mouth daily as needed for moderate constipation., Disp: 14 each, Rfl: 0 .  predniSONE (DELTASONE) 20 MG tablet, Take 1 tablet (20 mg total) by mouth daily with breakfast., Disp: 30 tablet, Rfl: 0 .  prochlorperazine (COMPAZINE) 5 MG tablet, Take 1 tablet (5 mg total) by mouth every 6 (six) hours as needed for nausea or vomiting. (Patient not taking: Reported on 05/19/2019), Disp: 30 tablet, Rfl: 1 .  senna-docusate (SENOKOT-S) 8.6-50 MG tablet, Take 1 tablet by mouth at bedtime as  needed for mild constipation., Disp: 30 tablet, Rfl: 0 .  terconazole (TERAZOL 7) 0.4 % vaginal cream, INSERT ONE APPLICATOR VAGINALLY EVERY 2 WEEKS (Patient not taking: Reported on 05/19/2019), Disp: 45 g, Rfl: 3 .  triamcinolone ointment (KENALOG) 0.5 %, Apply 1 application topically 2 (two) times daily., Disp: 30 g, Rfl: 0  Review of Systems:  Constitutional: Denies fever, chills, diaphoresis, appetite change. HEENT: Denies photophobia, eye pain, redness, hearing loss, ear  pain, congestion, sore throat, rhinorrhea, sneezing, mouth sores, trouble swallowing, neck pain, neck stiffness and tinnitus.   Respiratory: Denies SOB, DOE, cough, chest tightness,  and wheezing.   Cardiovascular: Denies chest pain, palpitations and leg swelling.  Gastrointestinal: Denies nausea, vomiting, abdominal pain, diarrhea, constipation, blood in stool and abdominal distention.  Genitourinary: Denies dysuria, urgency, frequency, hematuria, flank pain and difficulty urinating.  Endocrine: Denies: hot or cold intolerance, sweats, changes in hair or nails, polyuria, polydipsia. Musculoskeletal: Denies myalgias, back pain,  arthralgias and gait problem.  Skin: Denies pallor, rash and wound.  Neurological: Denies dizziness, seizures, syncope, weakness, light-headedness, numbness and headaches.  Hematological: Denies adenopathy. Easy bruising, personal or family bleeding history  Psychiatric/Behavioral: Denies suicidal ideation, mood changes, confusion, nervousness, sleep disturbance and agitation   Assessment and Plan:  Chronic lymphocytic leukemia (HCC) Intractalbe pain Closed wedge compression fracture of T8 vertebra with routine healing, subsequent encounter Opioid-induced Constipation  -Severe pain presumably due to new T8 compression fracture following fall and splenomegaly from CLL. -She just had oxycodone 10 mg refilled by oncology today. -Will put in referral to spine surgery for kyphoplasty/vertobroplasty. -If pain does not improve, she has been advised to go to ER over weekend for pain control and consideration of inpatient kyphoplasty. -Advised to take BID Miralax.    I discussed the assessment and treatment plan with the patient. The patient was provided an opportunity to ask questions and all were answered. The patient agreed with the plan and demonstrated an understanding of the instructions.   The patient was advised to call back or seek an in-person evaluation if the  symptoms worsen or if the condition fails to improve as anticipated.  I provided 23 minutes of non-face-to-face time during this encounter.   Lelon Frohlich, MD [facility]

## 2019-06-04 NOTE — Telephone Encounter (Signed)
-----   Message from Heath Lark, MD sent at 06/04/2019  8:10 AM EST ----- She had a fall 2 days ago with fractures Look like she has a PCP appointment this morning Can you call and ask how she is doing? Is her pain ok?

## 2019-06-04 NOTE — Telephone Encounter (Signed)
I increased to 10 mg dose so she does not have to take 2 each time Please make sure she understands

## 2019-06-07 ENCOUNTER — Inpatient Hospital Stay (HOSPITAL_COMMUNITY)
Admission: EM | Admit: 2019-06-07 | Discharge: 2019-06-10 | DRG: 948 | Disposition: A | Discharge status: Home Health | Payer: Medicare Other | Attending: Student | Admitting: Student

## 2019-06-07 ENCOUNTER — Other Ambulatory Visit: Payer: Self-pay

## 2019-06-07 ENCOUNTER — Emergency Department (HOSPITAL_COMMUNITY): Payer: Medicare Other

## 2019-06-07 DIAGNOSIS — Z90710 Acquired absence of both cervix and uterus: Secondary | ICD-10-CM

## 2019-06-07 DIAGNOSIS — Z79899 Other long term (current) drug therapy: Secondary | ICD-10-CM

## 2019-06-07 DIAGNOSIS — M810 Age-related osteoporosis without current pathological fracture: Secondary | ICD-10-CM | POA: Diagnosis present

## 2019-06-07 DIAGNOSIS — Z87891 Personal history of nicotine dependence: Secondary | ICD-10-CM

## 2019-06-07 DIAGNOSIS — G893 Neoplasm related pain (acute) (chronic): Secondary | ICD-10-CM | POA: Diagnosis not present

## 2019-06-07 DIAGNOSIS — S22060A Wedge compression fracture of T7-T8 vertebra, initial encounter for closed fracture: Secondary | ICD-10-CM | POA: Diagnosis present

## 2019-06-07 DIAGNOSIS — D6959 Other secondary thrombocytopenia: Secondary | ICD-10-CM | POA: Diagnosis present

## 2019-06-07 DIAGNOSIS — R000 Tachycardia, unspecified: Secondary | ICD-10-CM

## 2019-06-07 DIAGNOSIS — C9110 Chronic lymphocytic leukemia of B-cell type not having achieved remission: Secondary | ICD-10-CM | POA: Diagnosis not present

## 2019-06-07 DIAGNOSIS — T451X5A Adverse effect of antineoplastic and immunosuppressive drugs, initial encounter: Secondary | ICD-10-CM | POA: Diagnosis present

## 2019-06-07 DIAGNOSIS — R109 Unspecified abdominal pain: Secondary | ICD-10-CM

## 2019-06-07 DIAGNOSIS — M549 Dorsalgia, unspecified: Secondary | ICD-10-CM | POA: Diagnosis present

## 2019-06-07 DIAGNOSIS — D696 Thrombocytopenia, unspecified: Secondary | ICD-10-CM | POA: Diagnosis present

## 2019-06-07 DIAGNOSIS — Z20828 Contact with and (suspected) exposure to other viral communicable diseases: Secondary | ICD-10-CM | POA: Diagnosis present

## 2019-06-07 DIAGNOSIS — G8929 Other chronic pain: Secondary | ICD-10-CM

## 2019-06-07 DIAGNOSIS — K5900 Constipation, unspecified: Secondary | ICD-10-CM | POA: Diagnosis present

## 2019-06-07 DIAGNOSIS — R140 Abdominal distension (gaseous): Secondary | ICD-10-CM

## 2019-06-07 DIAGNOSIS — R52 Pain, unspecified: Secondary | ICD-10-CM

## 2019-06-07 DIAGNOSIS — Z853 Personal history of malignant neoplasm of breast: Secondary | ICD-10-CM

## 2019-06-07 DIAGNOSIS — I10 Essential (primary) hypertension: Secondary | ICD-10-CM | POA: Diagnosis present

## 2019-06-07 DIAGNOSIS — Z9013 Acquired absence of bilateral breasts and nipples: Secondary | ICD-10-CM

## 2019-06-07 DIAGNOSIS — W1830XA Fall on same level, unspecified, initial encounter: Secondary | ICD-10-CM | POA: Diagnosis present

## 2019-06-07 LAB — CBC
HCT: 36.5 % (ref 36.0–46.0)
Hemoglobin: 11.8 g/dL — ABNORMAL LOW (ref 12.0–15.0)
MCH: 35.2 pg — ABNORMAL HIGH (ref 26.0–34.0)
MCHC: 32.3 g/dL (ref 30.0–36.0)
MCV: 109 fL — ABNORMAL HIGH (ref 80.0–100.0)
Platelets: 123 10*3/uL — ABNORMAL LOW (ref 150–400)
RBC: 3.35 MIL/uL — ABNORMAL LOW (ref 3.87–5.11)
RDW: 15 % (ref 11.5–15.5)
WBC: 16.2 10*3/uL — ABNORMAL HIGH (ref 4.0–10.5)
nRBC: 0 % (ref 0.0–0.2)

## 2019-06-07 LAB — BASIC METABOLIC PANEL
Anion gap: 10 (ref 5–15)
BUN: 12 mg/dL (ref 8–23)
CO2: 24 mmol/L (ref 22–32)
Calcium: 9 mg/dL (ref 8.9–10.3)
Chloride: 100 mmol/L (ref 98–111)
Creatinine, Ser: 0.71 mg/dL (ref 0.44–1.00)
GFR calc Af Amer: >60 mL/min (ref >60)
GFR calc non Af Amer: >60 mL/min (ref >60)
Glucose, Bld: 129 mg/dL — ABNORMAL HIGH (ref 70–99)
Potassium: 4.1 mmol/L (ref 3.5–5.1)
Sodium: 134 mmol/L — ABNORMAL LOW (ref 135–145)

## 2019-06-07 LAB — TROPONIN I (HIGH SENSITIVITY): Troponin I (High Sensitivity): 5 ng/L (ref <18)

## 2019-06-07 MED ORDER — ONDANSETRON HCL 4 MG/2ML IJ SOLN
4.0000 mg | Freq: Once | INTRAMUSCULAR | Status: AC
  Administered 2019-06-07: 4 mg via INTRAVENOUS
  Filled 2019-06-07: qty 2

## 2019-06-07 MED ORDER — MORPHINE SULFATE (PF) 4 MG/ML IV SOLN: 4.0000 mg | Freq: Once | INTRAVENOUS | Status: DC

## 2019-06-07 MED ORDER — HYDROMORPHONE HCL 1 MG/ML IJ SOLN
1.0000 mg | Freq: Once | INTRAMUSCULAR | Status: AC
  Administered 2019-06-07: 1 mg via INTRAVENOUS
  Filled 2019-06-07: qty 1

## 2019-06-07 NOTE — ED Triage Notes (Signed)
Patient reports chest pain that started Tuesday that worsens with exertion and lower back pain that started a few months ago after starting Chemo. Patient also reports mild dyspnea with exertion.

## 2019-06-07 NOTE — ED Provider Notes (Signed)
[facility] DEPT Provider Note   CSN: [id number] Arrival date & time: 06/07/19  2022     History   Chief Complaint Chief Complaint  Patient presents with  . Chest Pain    Since Tuesday  . Back Pain    lower back pain over past few months    HPI April [name] is a 84 y.o. female.     Pt presents to the ED today with severe back pain.  Pt has a hx of CLL that has become active.  She is getting chemo with Dr. Alvy Bimler and fell on 11/3 after her chemo appt.  The pt did come to the ED on the 3rd and was found to have a new T8 compression fx.  She was given a rx for oxycodone 10 mg to take every 4 hrs.  Pt said she's been doing that, but pain has remained severe.  She said she's not been able to get out of a chair.  She had a telemedicine visit with her pcp on 11/5 who told her to come to the ED if sx worsen.  Pt said she is miserable.       Past Medical History:  Diagnosis Date  . Breast CA ([place])   . Bronchitis, chronic ([place])   . Eye hemorrhage, left   . Leukemia, chronic lymphoid ([place])   . 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 Osteoporosis 05/2018   T score -3.1 overall stable from prior study    Patient Active Problem List   Diagnosis Date Noted  . Closed wedge compression fracture of T8 vertebra ([place]) 06/07/2019  . Sinus tachycardia 06/07/2019  . Severe pain 06/07/2019  . Goals of care, counseling/discussion 05/14/2019  . Abnormal liver enzymes 05/14/2019  . Dehydration 05/14/2019  . Failure to thrive in adult 05/14/2019  . Abdominal pain 04/16/2019  . Cancer associated pain 04/16/2019  . Breast cancer ([place]) 06/19/2018  . Skin lesion 05/06/2017  . Essential hypertension 12/14/2016  . Systolic hypertension, isolated 11/16/2016  . Central retinal vein occlusion 10/18/2016  . Preventive measure 04/28/2015  . Thrombocytopenia ([place]) 04/28/2015  . Idiopathic scoliosis 10/23/2012  . Chronic lymphocytic leukemia  ([place]) 03/02/2009  . VENOUS INSUFFICIENCY, CHRONIC 10/28/2008  . Dyslipidemia 10/27/2007  . Osteoporosis 10/27/2007  . SKIN CANCER, HX OF 10/27/2007  . DIVERTICULITIS, HX OF 04/08/2007    Past Surgical History:  Procedure Laterality Date  . ABDOMINAL HYSTERECTOMY  1980  . BREAST IMPLANTS REMOVED  2001  . BREAST SURGERY     Bilateral mastectomy  . MASTECTOMY  1982   BILATERAL  . OOPHORECTOMY     BSO  . PARTIAL COLECTOMY     INTESTINAL ABSCESS WITH SALPINGECTOMY  . RECONSTRUCTION LEFT ELBOW    . TONSILLECTOMY    . UMBILLICAL HERNIA REPAIR       OB History    Gravida  0   Para  0   Term      Preterm      AB      Living        SAB      TAB      Ectopic      Multiple      Live Births               Home Medications    Prior to Admission medications   Medication Sig Start Date End Date Taking? Authorizing Provider  allopurinol (ZYLOPRIM) 300 MG tablet  Take 1 tablet (300 mg total) by mouth daily. 06/02/19  Yes Gorsuch, Ni, MD  amLODipine (NORVASC) 5 MG tablet Take 1 tablet (5 mg total) by mouth daily. 05/17/19  Yes Shelly Coss, MD  augmented betamethasone dipropionate (DIPROLENE-AF) 0.05 % cream Apply 1 application topically 2 (two) times daily as needed (flare ups).  05/08/18  Yes [provider]  oxyCODONE-acetaminophen (PERCOCET) 10-325 MG tablet Take 1 tablet by mouth every 4 (four) hours as needed for pain. 06/04/19  Yes Gorsuch, Ni, MD  polyethylene glycol (MIRALAX / GLYCOLAX) 17 g packet Take 17 g by mouth daily as needed for moderate constipation. Patient taking differently: Take 17 g by mouth 2 (two) times daily.  05/17/19  Yes Shelly Coss, MD  predniSONE (DELTASONE) 20 MG tablet Take 1 tablet (20 mg total) by mouth daily with breakfast. Patient taking differently: Take 20 mg by mouth 2 (two) times daily with a meal.  05/17/19  Yes Adhikari, Amrit, MD  senna-docusate (SENOKOT-S) 8.6-50 MG tablet Take 1 tablet by mouth at bedtime as  needed for mild constipation. 05/17/19  Yes Shelly Coss, MD  acyclovir (ZOVIRAX) 400 MG tablet Take 1 tablet (400 mg total) by mouth daily. Patient not taking: Reported on 05/19/2019 04/23/19   Heath Lark, MD  nystatin cream (MYCOSTATIN) Apply 1 application topically 2 (two) times daily. Patient not taking: Reported on 06/07/2019 04/09/17   Fontaine, Belinda Block, MD  ondansetron (ZOFRAN) 4 MG tablet Take 1 tablet (4 mg total) by mouth every 6 (six) hours as needed for nausea. Patient not taking: Reported on 05/19/2019 05/01/19   Heath Lark, MD  prochlorperazine (COMPAZINE) 5 MG tablet Take 1 tablet (5 mg total) by mouth every 6 (six) hours as needed for nausea or vomiting. Patient not taking: Reported on 05/19/2019 04/23/19   Heath Lark, MD  terconazole (TERAZOL 7) 0.4 % vaginal cream INSERT ONE APPLICATOR VAGINALLY EVERY 2 WEEKS Patient not taking: Reported on 05/19/2019 04/09/19   Fontaine, Belinda Block, MD  triamcinolone ointment (KENALOG) 0.5 % Apply 1 application topically 2 (two) times daily. Patient not taking: Reported on 06/07/2019 05/06/17   Heath Lark, MD    Family History Family History  Problem Relation Age of Onset  . Cancer Brother        HODGKINS  . Cancer Brother        leukemia  . Parkinson's disease Brother   . Heart disease Mother   . Heart disease Sister     Social History Social History   Tobacco Use  . Smoking status: Former Smoker    Types: Cigarettes    Quit date: 03/26/1955    Years since quitting: 64.2  . Smokeless tobacco: Never Used  Substance Use Topics  . Alcohol use: Yes    Alcohol/week: 5.0 standard drinks    Types: 5 Standard drinks or equivalent per week    Comment: wine  . Drug use: No     Allergies   Codeine and Levofloxacin   Review of Systems Review of Systems  Musculoskeletal: Positive for back pain.  All other systems reviewed and are negative.    Physical Exam Updated Vital Signs BP (!) 147/84   Pulse (!) 121   Temp 98.9  F (37.2 C) (Oral)   Resp (!) 23   LMP 03/26/1979   SpO2 97%   Physical Exam Vitals signs and nursing note reviewed.  Constitutional:      Appearance: She is well-developed.  HENT:     Head: Normocephalic and  atraumatic.  Eyes:     Extraocular Movements: Extraocular movements intact.     Pupils: Pupils are equal, round, and reactive to light.  Neck:     Musculoskeletal: Normal range of motion and neck supple.  Cardiovascular:     Rate and Rhythm: Regular rhythm. Tachycardia present.     Heart sounds: Normal heart sounds.  Pulmonary:     Effort: Pulmonary effort is normal.     Breath sounds: Normal breath sounds.  Abdominal:     General: Bowel sounds are normal.     Palpations: Abdomen is soft.  Musculoskeletal:       Arms:     Right lower leg: Edema present.     Left lower leg: Edema present.  Skin:    General: Skin is warm.     Capillary Refill: Capillary refill takes less than 2 seconds.  Neurological:     General: No focal deficit present.     Mental Status: She is alert and oriented to person, place, and time.      ED Treatments / Results  Labs (all labs ordered are listed, but only abnormal results are displayed) Labs Reviewed  BASIC METABOLIC PANEL - Abnormal; Notable for the following components:      Result Value   Sodium 134 (*)    Glucose, Bld 129 (*)    All other components within normal limits  CBC - Abnormal; Notable for the following components:   WBC 16.2 (*)    RBC 3.35 (*)    Hemoglobin 11.8 (*)    MCV 109.0 (*)    MCH 35.2 (*)    Platelets 123 (*)    All other components within normal limits  SARS CORONAVIRUS 2 (TAT 6-24 HRS)  TROPONIN I (HIGH SENSITIVITY)  TROPONIN I (HIGH SENSITIVITY)    EKG None  Radiology Dg Chest 2 View  Result Date: 06/07/2019 CLINICAL DATA:  Chest pain EXAM: CHEST - 2 VIEW COMPARISON:  June 02, 2019 FINDINGS: The cardiomediastinal silhouette is unchanged from prior exam with mild cardiomegaly. There is  opacity in the right upper lung which is likely due to overlap of shadows from positioning. The left lung is clear. No pleural effusion or focal airspace consolidation. Again noted is diffuse osteopenia and scoliotic curvature. IMPRESSION: Opacity in the right upper lung which is likely thought to be due to overlap of shadows. If clinical concern remains would recommend repeat radiograph. Electronically Signed   By: Prudencio Pair M.D.   On: 06/07/2019 22:13    Procedures Procedures (including critical care time)  Medications Ordered in ED Medications  ondansetron (ZOFRAN) injection 4 mg (4 mg Intravenous Given 06/07/19 2135)  HYDROmorphone (DILAUDID) injection 1 mg (1 mg Intravenous Given 06/07/19 2135)  HYDROmorphone (DILAUDID) injection 1 mg (1 mg Intravenous Given 06/07/19 2218)     Initial Impression / Assessment and Plan / ED Course  I have reviewed the triage vital signs and the nursing notes.  Pertinent labs & imaging results that were available during my care of the patient were reviewed by me and considered in my medical decision making (see chart for details).       Pt is finally feeling better after 2 mg dilaudid.  She has not been able to tolerate pain at home.  Pt is d/w Dr. Gloriann Loan (triad) for admission for pain control.  Final Clinical Impressions(s) / ED Diagnoses   Final diagnoses:  Closed wedge compression fracture of T8 vertebra, initial encounter (HCC)  Intractable pain  CLL (chronic lymphocytic leukemia) [facility])    ED Discharge Orders    None       Isla Pence, MD 06/07/19 2340

## 2019-06-07 NOTE — ED Notes (Signed)
Pt ambulatory to room with a steady gait. Pt did complain of pain in her lower back.

## 2019-06-07 NOTE — H&P (Addendum)
History and Physical    April [name] [id number] DOB: 11/21/1934 DOA: 06/07/2019  PCP: Isaac Bliss, Rayford Halsted, MD  Patient coming from: Home  Chief Complaint: "I have torso pain"  HPI: April [name] is a 84 y.o. female with medical history significant of CLL, osteoporosis, recent T8 compression fracture, hypertension who presents to [facility] for severe torso pain.  She states that she has had this pain since starting her chemotherapy regimen however the pain began getting worse on Tuesday after her last chemotherapy treatment with an associated fall.  At that time she had her oxycodone increased from 5 mg every 4 hours to 10 mg every 4 hours.  She reports this did not improve her severe pain.  She reports the pain is a stabbing pain that is located throughout her torso and she is unable to pinpoint the pain due to the current severity.  She did receive IV Dilaudid in the emergency room and reports that her pain is much improved currently.  Palpation of her abdomen makes her pain worse as does any sort of quick/jarring movement.  She denies any other associated symptoms.  Of note, the patient was recently in the emergency room on 11/3 following her fall where she was diagnosed with the T8 compression fracture and was having similar pain at that time.    ED Course: Patient received IV fluid and 1 mg of IV Dilaudid x2 approximately 1 hour apart with steady improvement in her pain.  Patient is admitted to hospitalist service in observation status for intractable pain related to her malignancy.  Review of Systems: As per HPI otherwise 10 point review of systems negative.   Past Medical History:  Diagnosis Date  . Breast CA ([place])   . Bronchitis, chronic ([place])   . Eye hemorrhage, left   . Leukemia, chronic lymphoid ([place])   . 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 Osteoporosis 05/2018   T score -3.1 overall stable from prior study    Past Surgical History:   Procedure Laterality Date  . ABDOMINAL HYSTERECTOMY  1980  . BREAST IMPLANTS REMOVED  2001  . BREAST SURGERY     Bilateral mastectomy  . MASTECTOMY  1982   BILATERAL  . OOPHORECTOMY     BSO  . PARTIAL COLECTOMY     INTESTINAL ABSCESS WITH SALPINGECTOMY  . RECONSTRUCTION LEFT ELBOW    . TONSILLECTOMY    . UMBILLICAL HERNIA REPAIR       reports that she quit smoking about 64 years ago. Her smoking use included cigarettes. She has never used smokeless tobacco. She reports current alcohol use of about 5.0 standard drinks of alcohol per week. She reports that she does not use drugs.  Allergies  Allergen Reactions  . Codeine Nausea Only  . Levofloxacin Rash    Family History  Problem Relation Age of Onset  . Cancer Brother        HODGKINS  . Cancer Brother        leukemia  . Parkinson's disease Brother   . Heart disease Mother   . Heart disease Sister     Prior to Admission medications   Medication Sig Start Date End Date Taking? Authorizing Provider  allopurinol (ZYLOPRIM) 300 MG tablet Take 1 tablet (300 mg total) by mouth daily. 06/02/19  Yes Gorsuch, Ni, MD  amLODipine (NORVASC) 5 MG tablet Take 1 tablet (5 mg total) by mouth daily. 05/17/19  Yes Shelly Coss, MD  augmented betamethasone dipropionate (DIPROLENE-AF) 0.05 % cream Apply 1 application topically 2 (two) times daily as needed (flare ups).  05/08/18  Yes [provider]  oxyCODONE-acetaminophen (PERCOCET) 10-325 MG tablet Take 1 tablet by mouth every 4 (four) hours as needed for pain. 06/04/19  Yes Gorsuch, Ni, MD  polyethylene glycol (MIRALAX / GLYCOLAX) 17 g packet Take 17 g by mouth daily as needed for moderate constipation. Patient taking differently: Take 17 g by mouth 2 (two) times daily.  05/17/19  Yes Shelly Coss, MD  predniSONE (DELTASONE) 20 MG tablet Take 1 tablet (20 mg total) by mouth daily with breakfast. Patient taking differently: Take 20 mg by mouth 2 (two) times daily with a meal.   05/17/19  Yes Adhikari, Amrit, MD  senna-docusate (SENOKOT-S) 8.6-50 MG tablet Take 1 tablet by mouth at bedtime as needed for mild constipation. 05/17/19  Yes Shelly Coss, MD  acyclovir (ZOVIRAX) 400 MG tablet Take 1 tablet (400 mg total) by mouth daily. Patient not taking: Reported on 05/19/2019 04/23/19   Heath Lark, MD  nystatin cream (MYCOSTATIN) Apply 1 application topically 2 (two) times daily. Patient not taking: Reported on 06/07/2019 04/09/17   Fontaine, Belinda Block, MD  ondansetron (ZOFRAN) 4 MG tablet Take 1 tablet (4 mg total) by mouth every 6 (six) hours as needed for nausea. Patient not taking: Reported on 05/19/2019 05/01/19   Heath Lark, MD  prochlorperazine (COMPAZINE) 5 MG tablet Take 1 tablet (5 mg total) by mouth every 6 (six) hours as needed for nausea or vomiting. Patient not taking: Reported on 05/19/2019 04/23/19   Heath Lark, MD  terconazole (TERAZOL 7) 0.4 % vaginal cream INSERT ONE APPLICATOR VAGINALLY EVERY 2 WEEKS Patient not taking: Reported on 05/19/2019 04/09/19   Fontaine, Belinda Block, MD  triamcinolone ointment (KENALOG) 0.5 % Apply 1 application topically 2 (two) times daily. Patient not taking: Reported on 06/07/2019 05/06/17   Heath Lark, MD    Physical Exam: Vitals:   06/07/19 2137 06/07/19 2317 06/07/19 2330 06/08/19 0000  BP: (!) 143/81 (!) 147/84 (!) 155/92 (!) 151/88  Pulse: (!) 119 (!) 121 (!) 124 (!) 125  Resp: 20 (!) 23 (!) 21 (!) 28  Temp:      TempSrc:      SpO2: 98% 97% 99% 97%    Constitutional: NAD, calm, comfortable Vitals:   06/07/19 2137 06/07/19 2317 06/07/19 2330 06/08/19 0000  BP: (!) 143/81 (!) 147/84 (!) 155/92 (!) 151/88  Pulse: (!) 119 (!) 121 (!) 124 (!) 125  Resp: 20 (!) 23 (!) 21 (!) 28  Temp:      TempSrc:      SpO2: 98% 97% 99% 97%   Eyes: PERRL, lids and conjunctivae normal ENMT: Mucous membranes are moist.Normal dentition.  Neck: normal, supple, no masses, no thyromegaly Respiratory: clear to auscultation  bilaterally, no wheezing, no crackles. Normal respiratory effort. No accessory muscle use.  Cardiovascular: Regular rate and rhythm, no murmurs / rubs / gallops. Trace/1+ LE edema. 2+ pedal pulses. Abdomen: + tenderness without guarding or rebound, no masses palpated. Abdomen distended. Bowel sounds positive.  Musculoskeletal: no clubbing / cyanosis. No joint deformity upper and lower extremities. Good ROM, no contractures. Normal muscle tone.  Skin: no rashes, lesions, ulcers. No induration Neurologic: CN 2-12 grossly intact. Sensation intact, DTR normal. Strength 5/5 in all 4.  Psychiatric: Normal judgment and insight. Alert and oriented x 3. Normal mood.   Labs on Admission: I have personally reviewed following labs and imaging studies  CBC: Recent Labs  Lab 06/02/19 2223 06/07/19 2130  WBC 155.7* 16.2*  NEUTROABS 9.1*  --   HGB 10.8* 11.8*  HCT 35.9* 36.5  MCV 110.5* 109.0*  PLT 174 [id number]*   Basic Metabolic Panel: Recent Labs  Lab 06/02/19 2223 06/07/19 2130  NA 136 134*  K 5.0 4.1  CL 103 100  CO2 25 24  GLUCOSE 159* 129*  BUN 14 12  CREATININE 0.69 0.71  CALCIUM 9.2 9.0   GFR: Estimated Creatinine Clearance: 39.6 mL/min (by C-G formula based on SCr of 0.71 mg/dL). Liver Function Tests: No results for input(s): AST, ALT, ALKPHOS, BILITOT, PROT, ALBUMIN in the last 168 hours. No results for input(s): LIPASE, AMYLASE in the last 168 hours. No results for input(s): AMMONIA in the last 168 hours. Coagulation Profile: No results for input(s): INR, PROTIME in the last 168 hours. Cardiac Enzymes: No results for input(s): CKTOTAL, CKMB, CKMBINDEX, TROPONINI in the last 168 hours. BNP (last 3 results) No results for input(s): PROBNP in the last 8760 hours. HbA1C: No results for input(s): HGBA1C in the last 72 hours. CBG: No results for input(s): GLUCAP in the last 168 hours. Lipid Profile: No results for input(s): CHOL, HDL, LDLCALC, TRIG, CHOLHDL, LDLDIRECT in the last  72 hours. Thyroid Function Tests: No results for input(s): TSH, T4TOTAL, FREET4, T3FREE, THYROIDAB in the last 72 hours. Anemia Panel: No results for input(s): VITAMINB12, FOLATE, FERRITIN, TIBC, IRON, RETICCTPCT in the last 72 hours. Urine analysis:    Component Value Date/Time   COLORURINE DARK YELLOW 05/19/2019 [place] 05/19/2019 1112   LABSPEC 1.020 05/19/2019 1112   PHURINE 6.0 05/19/2019 1112   GLUCOSEU NEGATIVE 05/19/2019 1112   HGBUR 3+ (A) 05/19/2019 1112   BILIRUBINUR n 01/23/2016 [place] 05/19/2019 1112   PROTEINUR NEGATIVE 05/19/2019 1112   UROBILINOGEN 0.2 01/23/2016 0927   UROBILINOGEN 0.2 03/30/2014 1203   NITRITE n 01/23/2016 0927   NITRITE NEGATIVE 04/01/2015 1347   LEUKOCYTESUR Negative 01/23/2016 0927    Radiological Exams on Admission: Dg Chest 2 View  Result Date: 06/07/2019 CLINICAL DATA:  Chest pain EXAM: CHEST - 2 VIEW COMPARISON:  June 02, 2019 FINDINGS: The cardiomediastinal silhouette is unchanged from prior exam with mild cardiomegaly. There is opacity in the right upper lung which is likely due to overlap of shadows from positioning. The left lung is clear. No pleural effusion or focal airspace consolidation. Again noted is diffuse osteopenia and scoliotic curvature. IMPRESSION: Opacity in the right upper lung which is likely thought to be due to overlap of shadows. If clinical concern remains would recommend repeat radiograph. Electronically Signed   By: Prudencio Pair M.D.   On: 06/07/2019 22:13    EKG: Independently reviewed. Sinus tachycardia  Assessment/Plan Active Problems:   Chronic lymphocytic leukemia (HCC)   Thrombocytopenia (HCC)   Essential hypertension   Cancer associated pain   Closed wedge compression fracture of T8 vertebra (HCC)   Sinus tachycardia   Severe pain   Abdominal distension  #Severe, intractable cancer-related pain Patient with unbearable torso/abdominal pain related to her  malignancy. We will provide the patient with 1 mg of IV Dilaudid every 4 hours as needed We will restart home oxycodone every 4 hours as needed.  Will need up titration of her pain regimen once her acute pain is under better control  #Abdominal distention Patient with mild abdominal distention with some concern for fluid on exam.  Will order an abdominal ultrasound for further  evaluation.  #Hypertension We will restart home amlodipine  #Thrombocytopenia Platelets are decreased from baseline down to 123, possibly related to chemotherapy regimen.  Will repeat CBC in the morning.  #T8 compression fracture Continuing supportive care as above with pain control.  #Sinus tachycardia Likely related to severe pain.  She does not appear to be volume depleted or septic.  Troponin negative x2.  No concerning signs or symptoms of PE.  Given her known malignancy she is considered intermediate risk for a PE so we will check a D-dimer and if positive proceed with CTA of the chest for further evaluation.  DVT prophylaxis: Lovenox  Code Status: Full code Family Communication: Niece at bedside Disposition Plan: Anticipate discharge in 24-48 home pending clinical improvement  Consults called: None Admission status: Obs   Arlan Organ DO [facility]  06/08/2019, 12:09 AM

## 2019-06-08 ENCOUNTER — Inpatient Hospital Stay: Payer: Medicare Other | Admitting: Hematology and Oncology

## 2019-06-08 ENCOUNTER — Inpatient Hospital Stay (HOSPITAL_COMMUNITY): Payer: Medicare Other

## 2019-06-08 ENCOUNTER — Inpatient Hospital Stay: Payer: Medicare Other

## 2019-06-08 ENCOUNTER — Encounter (HOSPITAL_COMMUNITY): Payer: Self-pay

## 2019-06-08 ENCOUNTER — Observation Stay (HOSPITAL_COMMUNITY): Payer: Medicare Other

## 2019-06-08 ENCOUNTER — Other Ambulatory Visit: Payer: Self-pay

## 2019-06-08 DIAGNOSIS — D6959 Other secondary thrombocytopenia: Secondary | ICD-10-CM | POA: Diagnosis present

## 2019-06-08 DIAGNOSIS — W1830XA Fall on same level, unspecified, initial encounter: Secondary | ICD-10-CM | POA: Diagnosis present

## 2019-06-08 DIAGNOSIS — R140 Abdominal distension (gaseous): Secondary | ICD-10-CM

## 2019-06-08 DIAGNOSIS — Z20828 Contact with and (suspected) exposure to other viral communicable diseases: Secondary | ICD-10-CM | POA: Diagnosis present

## 2019-06-08 DIAGNOSIS — C9110 Chronic lymphocytic leukemia of B-cell type not having achieved remission: Secondary | ICD-10-CM

## 2019-06-08 DIAGNOSIS — M810 Age-related osteoporosis without current pathological fracture: Secondary | ICD-10-CM | POA: Diagnosis present

## 2019-06-08 DIAGNOSIS — M7989 Other specified soft tissue disorders: Secondary | ICD-10-CM | POA: Diagnosis not present

## 2019-06-08 DIAGNOSIS — T451X5A Adverse effect of antineoplastic and immunosuppressive drugs, initial encounter: Secondary | ICD-10-CM | POA: Diagnosis present

## 2019-06-08 DIAGNOSIS — K5900 Constipation, unspecified: Secondary | ICD-10-CM | POA: Diagnosis present

## 2019-06-08 DIAGNOSIS — Z87891 Personal history of nicotine dependence: Secondary | ICD-10-CM | POA: Diagnosis not present

## 2019-06-08 DIAGNOSIS — R52 Pain, unspecified: Secondary | ICD-10-CM | POA: Diagnosis present

## 2019-06-08 DIAGNOSIS — I10 Essential (primary) hypertension: Secondary | ICD-10-CM | POA: Diagnosis present

## 2019-06-08 DIAGNOSIS — G893 Neoplasm related pain (acute) (chronic): Principal | ICD-10-CM

## 2019-06-08 DIAGNOSIS — Z515 Encounter for palliative care: Secondary | ICD-10-CM | POA: Diagnosis not present

## 2019-06-08 DIAGNOSIS — Z79899 Other long term (current) drug therapy: Secondary | ICD-10-CM | POA: Diagnosis not present

## 2019-06-08 DIAGNOSIS — Z9013 Acquired absence of bilateral breasts and nipples: Secondary | ICD-10-CM | POA: Diagnosis not present

## 2019-06-08 DIAGNOSIS — Z90710 Acquired absence of both cervix and uterus: Secondary | ICD-10-CM | POA: Diagnosis not present

## 2019-06-08 DIAGNOSIS — S22060A Wedge compression fracture of T7-T8 vertebra, initial encounter for closed fracture: Secondary | ICD-10-CM | POA: Diagnosis present

## 2019-06-08 DIAGNOSIS — M549 Dorsalgia, unspecified: Secondary | ICD-10-CM | POA: Diagnosis present

## 2019-06-08 DIAGNOSIS — Z853 Personal history of malignant neoplasm of breast: Secondary | ICD-10-CM | POA: Diagnosis not present

## 2019-06-08 LAB — COMPREHENSIVE METABOLIC PANEL
ALT: 26 U/L (ref 0–44)
AST: 20 U/L (ref 15–41)
Albumin: 3.3 g/dL — ABNORMAL LOW (ref 3.5–5.0)
Alkaline Phosphatase: 93 U/L (ref 38–126)
Anion gap: 9 (ref 5–15)
BUN: 11 mg/dL (ref 8–23)
CO2: 27 mmol/L (ref 22–32)
Calcium: 8.8 mg/dL — ABNORMAL LOW (ref 8.9–10.3)
Chloride: 100 mmol/L (ref 98–111)
Creatinine, Ser: 0.79 mg/dL (ref 0.44–1.00)
GFR calc Af Amer: >60 mL/min (ref >60)
GFR calc non Af Amer: >60 mL/min (ref >60)
Glucose, Bld: 103 mg/dL — ABNORMAL HIGH (ref 70–99)
Potassium: 4.1 mmol/L (ref 3.5–5.1)
Sodium: 136 mmol/L (ref 135–145)
Total Bilirubin: 1.7 mg/dL — ABNORMAL HIGH (ref 0.3–1.2)
Total Protein: 5.8 g/dL — ABNORMAL LOW (ref 6.5–8.1)

## 2019-06-08 LAB — D-DIMER, QUANTITATIVE: D-Dimer, Quant: 0.95 ug/mL-FEU — ABNORMAL HIGH (ref 0.00–0.50)

## 2019-06-08 LAB — CBC
HCT: 32.7 % — ABNORMAL LOW (ref 36.0–46.0)
Hemoglobin: 10.3 g/dL — ABNORMAL LOW (ref 12.0–15.0)
MCH: 34.3 pg — ABNORMAL HIGH (ref 26.0–34.0)
MCHC: 31.5 g/dL (ref 30.0–36.0)
MCV: 109 fL — ABNORMAL HIGH (ref 80.0–100.0)
Platelets: 115 10*3/uL — ABNORMAL LOW (ref 150–400)
RBC: 3 MIL/uL — ABNORMAL LOW (ref 3.87–5.11)
RDW: 14.9 % (ref 11.5–15.5)
WBC: 11.9 10*3/uL — ABNORMAL HIGH (ref 4.0–10.5)
nRBC: 0 % (ref 0.0–0.2)

## 2019-06-08 LAB — TROPONIN I (HIGH SENSITIVITY): Troponin I (High Sensitivity): 6 ng/L (ref <18)

## 2019-06-08 LAB — SARS CORONAVIRUS 2 (TAT 6-24 HRS): SARS Coronavirus 2: NEGATIVE

## 2019-06-08 MED ORDER — GADOBUTROL 1 MMOL/ML IV SOLN
7.5000 mL | Freq: Once | INTRAVENOUS | Status: AC | PRN
  Administered 2019-06-08: 5 mL via INTRAVENOUS

## 2019-06-08 MED ORDER — OXYCODONE HCL 5 MG PO TABS
5.0000 mg | ORAL_TABLET | ORAL | Status: DC | PRN
  Administered 2019-06-08 (×2): 5 mg via ORAL
  Filled 2019-06-08 (×2): qty 1

## 2019-06-08 MED ORDER — PREDNISONE 20 MG PO TABS
20.0000 mg | ORAL_TABLET | Freq: Two times a day (BID) | ORAL | Status: DC
  Administered 2019-06-08 – 2019-06-09 (×3): 20 mg via ORAL
  Filled 2019-06-08 (×3): qty 1

## 2019-06-08 MED ORDER — SENNOSIDES-DOCUSATE SODIUM 8.6-50 MG PO TABS: 1.0000 | ORAL_TABLET | Freq: Every evening | ORAL | Status: DC | PRN

## 2019-06-08 MED ORDER — OXYCODONE HCL 5 MG PO TABS: 5.0000 mg | ORAL_TABLET | ORAL | Status: DC | PRN

## 2019-06-08 MED ORDER — BETAMETHASONE DIPROPIONATE AUG 0.05 % EX CREA: 1.0000 "application " | TOPICAL_CREAM | Freq: Two times a day (BID) | CUTANEOUS | Status: DC | PRN

## 2019-06-08 MED ORDER — ONDANSETRON HCL 4 MG/2ML IJ SOLN: 4.0000 mg | Freq: Four times a day (QID) | INTRAMUSCULAR | Status: DC | PRN

## 2019-06-08 MED ORDER — HYDROMORPHONE HCL 1 MG/ML IJ SOLN
1.0000 mg | INTRAMUSCULAR | Status: DC | PRN
  Administered 2019-06-08 – 2019-06-09 (×6): 1 mg via INTRAVENOUS
  Filled 2019-06-08 (×6): qty 1

## 2019-06-08 MED ORDER — ENOXAPARIN SODIUM 40 MG/0.4ML ~~LOC~~ SOLN
40.0000 mg | SUBCUTANEOUS | Status: DC
  Administered 2019-06-08 – 2019-06-10 (×3): 40 mg via SUBCUTANEOUS
  Filled 2019-06-08 (×3): qty 0.4

## 2019-06-08 MED ORDER — OXYCODONE-ACETAMINOPHEN 10-325 MG PO TABS: 1.0000 | ORAL_TABLET | ORAL | Status: DC | PRN

## 2019-06-08 MED ORDER — HYDROMORPHONE HCL 1 MG/ML IJ SOLN
1.0000 mg | Freq: Once | INTRAMUSCULAR | Status: AC
  Administered 2019-06-08: 1 mg via INTRAVENOUS
  Filled 2019-06-08: qty 1

## 2019-06-08 MED ORDER — ALLOPURINOL 300 MG PO TABS
300.0000 mg | ORAL_TABLET | Freq: Every day | ORAL | Status: DC
  Administered 2019-06-08 – 2019-06-10 (×3): 300 mg via ORAL
  Filled 2019-06-08 (×3): qty 1

## 2019-06-08 MED ORDER — AMLODIPINE BESYLATE 5 MG PO TABS
5.0000 mg | ORAL_TABLET | Freq: Every day | ORAL | Status: DC
  Administered 2019-06-08 – 2019-06-10 (×3): 5 mg via ORAL
  Filled 2019-06-08 (×3): qty 1

## 2019-06-08 MED ORDER — ACETAMINOPHEN 325 MG PO TABS: 650.0000 mg | ORAL_TABLET | Freq: Four times a day (QID) | ORAL | Status: DC | PRN

## 2019-06-08 MED ORDER — SODIUM CHLORIDE 0.9% FLUSH
3.0000 mL | Freq: Two times a day (BID) | INTRAVENOUS | Status: DC
  Administered 2019-06-08 – 2019-06-09 (×5): 3 mL via INTRAVENOUS

## 2019-06-08 MED ORDER — OXYCODONE-ACETAMINOPHEN 5-325 MG PO TABS
1.0000 | ORAL_TABLET | ORAL | Status: DC | PRN
  Administered 2019-06-08 (×2): 1 via ORAL
  Filled 2019-06-08 (×2): qty 1

## 2019-06-08 MED ORDER — SODIUM CHLORIDE (PF) 0.9 % IJ SOLN
INTRAMUSCULAR | Status: AC
  Filled 2019-06-08: qty 50

## 2019-06-08 MED ORDER — ACETAMINOPHEN 650 MG RE SUPP: 650.0000 mg | Freq: Four times a day (QID) | RECTAL | Status: DC | PRN

## 2019-06-08 MED ORDER — OXYCODONE HCL 5 MG PO TABS
10.0000 mg | ORAL_TABLET | ORAL | Status: DC | PRN
  Administered 2019-06-09 – 2019-06-10 (×2): 10 mg via ORAL
  Filled 2019-06-08 (×2): qty 2

## 2019-06-08 MED ORDER — ONDANSETRON HCL 4 MG PO TABS: 4.0000 mg | ORAL_TABLET | Freq: Four times a day (QID) | ORAL | Status: DC | PRN

## 2019-06-08 MED ORDER — TRIAMCINOLONE ACETONIDE 0.5 % EX CREA
TOPICAL_CREAM | Freq: Two times a day (BID) | CUTANEOUS | Status: DC | PRN
  Filled 2019-06-08: qty 15

## 2019-06-08 MED ORDER — POLYETHYLENE GLYCOL 3350 17 G PO PACK: 17.0000 g | PACK | Freq: Every day | ORAL | Status: DC | PRN

## 2019-06-08 MED ORDER — HYDROMORPHONE HCL 1 MG/ML IJ SOLN
1.0000 mg | INTRAMUSCULAR | Status: DC | PRN
  Administered 2019-06-08 (×2): 1 mg via INTRAVENOUS
  Filled 2019-06-08 (×2): qty 1

## 2019-06-08 MED ORDER — POLYETHYLENE GLYCOL 3350 17 G PO PACK
17.0000 g | PACK | Freq: Two times a day (BID) | ORAL | Status: DC
  Administered 2019-06-08 – 2019-06-10 (×4): 17 g via ORAL
  Filled 2019-06-08 (×4): qty 1

## 2019-06-08 MED ORDER — IOHEXOL 350 MG/ML SOLN
100.0000 mL | Freq: Once | INTRAVENOUS | Status: AC | PRN
  Administered 2019-06-08: 100 mL via INTRAVENOUS

## 2019-06-08 NOTE — Progress Notes (Signed)
Patient has yellow MEWS due to pulse 113. Patient has had high pulse since admission. This is nothing new.  MD already aware.  I will monitor patient overnight.Roderick Pee

## 2019-06-08 NOTE — Progress Notes (Signed)
PT Cancellation Note  Patient Details Name: TALESE DEJULIO MRN: [id number] DOB: 08/31/1934   Cancelled Treatment:    Reason Eval/Treat Not Completed: Medical issues which prohibited therapy noted HR to be 108-118 per live monitoring by dynamap with RN present. Holding for now, plan to contact MD regarding PT hold.    Windell Norfolk, DPT, CBIS  Supplemental Physical Therapist [facility]    Pager [phone] Acute Rehab Office [phone]

## 2019-06-08 NOTE — Progress Notes (Signed)
PROGRESS NOTE  April [name] [id number] DOB: 12/13/1934 DOA: 06/07/2019 PCP: Isaac Bliss, Rayford Halsted, MD   LOS: 0 days   Brief Narrative / Interim history: 84 year old female with history of CLL currently getting chemotherapy, osteoporosis, recent T8 compression fracture after a fall, presents to the hospital with left sided abdominal pain  Subjective / 24h Interval events: She is complaining of severe left sided abdominal pain, not relieved by anything. Almost crying this morning.   Assessment & Plan: Active Problems:   CLL (chronic lymphocytic leukemia) (HCC)   Thrombocytopenia (HCC)   Essential hypertension   Cancer associated pain   Closed wedge compression fracture of T8 vertebra (HCC)   Sinus tachycardia   Intractable pain   Abdominal distension   Severe pain   Principal Problem Severe, intractable cancer related pain -She did have a thoracic vertebra compression fracture, about 30% height loss however her pain is mainly in her abdomen -I have consulted palliative care to assist with pain, appreciate input -For now increase her IV Dilaudid, increase her oxycodone as well  Active Problems Hypertension -Continue home amlodipine  Thrombocytopenia -Likely related to chemotherapy  CLL -White count decreased significantly following chemotherapy.  Discussed over the phone with Dr. Alvy Bimler  Possible CBD stone -I wonder whether this could be her source of atypical pain however she has no nausea, vomiting or any other symptoms directly related to that. -will order an MRCP  Scheduled Meds:  allopurinol  300 mg Oral Daily   amLODipine  5 mg Oral Daily   enoxaparin (LOVENOX) injection  40 mg Subcutaneous Q24H   polyethylene glycol  17 g Oral BID   predniSONE  20 mg Oral BID WC   sodium chloride flush  3 mL Intravenous Q12H   Continuous Infusions: PRN Meds:.acetaminophen **OR** acetaminophen, HYDROmorphone (DILAUDID) injection, ondansetron **OR**  ondansetron (ZOFRAN) IV, oxyCODONE, polyethylene glycol, senna-docusate, triamcinolone cream  DVT prophylaxis: Lovenox Code Status: Full code Family Communication: d/w patient  Disposition Plan: home when ready   Consultants:  Palliative   Procedures:  None   Microbiology  None   Antimicrobials: None     Objective: Vitals:   06/08/19 0121 06/08/19 0322 06/08/19 0522 06/08/19 1140  BP: (!) 167/83 135/81 (!) 143/86 139/82  Pulse: (!) 115 (!) 113 (!) 102 (!) 108  Resp: 17 17 18    Temp: 98.4 F (36.9 C) 98.3 F (36.8 C) 98.2 F (36.8 C)   TempSrc: Oral Oral Oral   SpO2: 100% 95% 97%     Intake/Output Summary (Last 24 hours) at 06/08/2019 1312 Last data filed at 06/08/2019 1006 Gross per 24 hour  Intake 3 ml  Output 400 ml  Net -397 ml   There were no vitals filed for this visit.  Examination:  Constitutional: Appears uncomfortable Eyes: PERRL, lids and conjunctivae normal ENMT: Mucous membranes are moist. Respiratory: clear to auscultation bilaterally, no wheezing, no crackles. Normal respiratory effort.  Cardiovascular: Regular rate and rhythm, no murmurs / rubs / gallops.  Abdomen: Bowel sounds positive, no guarding Musculoskeletal: no clubbing / cyanosis.  Neurologic: Nonfocal Psychiatric: Normal judgment and insight. Alert and oriented x 3. Normal mood.    Data Reviewed: I have independently reviewed following labs and imaging studies   CBC: Recent Labs  Lab 06/02/19 2223 06/07/19 2130 06/08/19 0526  WBC 155.7* 16.2* 11.9*  NEUTROABS 9.1*  --   --   HGB 10.8* 11.8* 10.3*  HCT 35.9* 36.5 32.7*  MCV 110.5* 109.0* 109.0*  PLT 174 123* 115*  Basic Metabolic Panel: Recent Labs  Lab 06/02/19 2223 06/07/19 2130 06/08/19 0526  NA 136 134* 136  K 5.0 4.1 4.1  CL 103 100 100  CO2 25 24 27   GLUCOSE 159* 129* 103*  BUN 14 12 11   CREATININE 0.69 0.71 0.79  CALCIUM 9.2 9.0 8.8*   GFR: Estimated Creatinine Clearance: 39.6 mL/min (by C-G formula  based on SCr of 0.79 mg/dL). Liver Function Tests: Recent Labs  Lab 06/08/19 0526  AST 20  ALT 26  ALKPHOS 93  BILITOT 1.7*  PROT 5.8*  ALBUMIN 3.3*   No results for input(s): LIPASE, AMYLASE in the last 168 hours. No results for input(s): AMMONIA in the last 168 hours. Coagulation Profile: No results for input(s): INR, PROTIME in the last 168 hours. Cardiac Enzymes: No results for input(s): CKTOTAL, CKMB, CKMBINDEX, TROPONINI in the last 168 hours. BNP (last 3 results) No results for input(s): PROBNP in the last 8760 hours. HbA1C: No results for input(s): HGBA1C in the last 72 hours. CBG: No results for input(s): GLUCAP in the last 168 hours. Lipid Profile: No results for input(s): CHOL, HDL, LDLCALC, TRIG, CHOLHDL, LDLDIRECT in the last 72 hours. Thyroid Function Tests: No results for input(s): TSH, T4TOTAL, FREET4, T3FREE, THYROIDAB in the last 72 hours. Anemia Panel: No results for input(s): VITAMINB12, FOLATE, FERRITIN, TIBC, IRON, RETICCTPCT in the last 72 hours. Urine analysis:    Component Value Date/Time   COLORURINE DARK YELLOW 05/19/2019 1112   APPEARANCEUR CLEAR 05/19/2019 1112   LABSPEC 1.020 05/19/2019 1112   PHURINE 6.0 05/19/2019 1112   GLUCOSEU NEGATIVE 05/19/2019 1112   HGBUR 3+ (A) 05/19/2019 1112   BILIRUBINUR n 01/23/2016 [place] 05/19/2019 1112   PROTEINUR NEGATIVE 05/19/2019 1112   UROBILINOGEN 0.2 01/23/2016 0927   UROBILINOGEN 0.2 03/30/2014 1203   NITRITE n 01/23/2016 0927   NITRITE NEGATIVE 04/01/2015 1347   LEUKOCYTESUR Negative 01/23/2016 0927   Sepsis Labs: Invalid input(s): PROCALCITONIN, LACTICIDVEN  Recent Results (from the past 240 hour(s))  SARS CORONAVIRUS 2 (TAT 6-24 HRS) Nasopharyngeal Nasopharyngeal Swab     Status: None   Collection Time: 06/07/19 11:14 PM   Specimen: Nasopharyngeal Swab  Result Value Ref Range Status   SARS Coronavirus 2 NEGATIVE NEGATIVE Final    Comment: (NOTE) SARS-CoV-2 target  nucleic acids are NOT DETECTED. The SARS-CoV-2 RNA is generally detectable in upper and lower respiratory specimens during the acute phase of infection. Negative results do not preclude SARS-CoV-2 infection, do not rule out co-infections with other pathogens, and should not be used as the sole basis for treatment or other patient management decisions. Negative results must be combined with clinical observations, patient history, and epidemiological information. The expected result is Negative. Fact Sheet for Patients: SugarRoll.be Fact Sheet for Healthcare Providers: https://www.woods-mathews.com/ This test is not yet approved or cleared by the [place] FDA and  has been authorized for detection and/or diagnosis of SARS-CoV-2 by FDA under an Emergency Use Authorization (EUA). This EUA will remain  in effect (meaning this test can be used) for the duration of the COVID-19 declaration under Section 56 4(b)(1) of the Act, 21 U.S.C. section 360bbb-3(b)(1), unless the authorization is terminated or revoked sooner. Performed at [facility], [place] [address], [place], [place] [address]       Radiology Studies: Dg Chest 2 View  Result Date: 06/07/2019 CLINICAL DATA:  Chest pain EXAM: CHEST - 2 VIEW COMPARISON:  June 02, 2019 FINDINGS: The cardiomediastinal silhouette is unchanged  from prior exam with mild cardiomegaly. There is opacity in the right upper lung which is likely due to overlap of shadows from positioning. The left lung is clear. No pleural effusion or focal airspace consolidation. Again noted is diffuse osteopenia and scoliotic curvature. IMPRESSION: Opacity in the right upper lung which is likely thought to be due to overlap of shadows. If clinical concern remains would recommend repeat radiograph. Electronically Signed   By: Prudencio Pair M.D.   On: 06/07/2019 22:13   US Abdomen Complete  Result Date: 06/08/2019 CLINICAL  DATA:  Abdominal distention. EXAM: ABDOMEN ULTRASOUND COMPLETE COMPARISON:  CT 06/02/2019. FINDINGS: Gallbladder: No gallstones or wall thickening visualized. No sonographic Murphy sign noted by sonographer. Common bile duct: Diameter: 8.5 mm. Intrahepatic biliary ductal dilatation also noted. Distal common bile duct stone suspected on prior CT. Again MRCP may prove useful for further evaluation. Liver: No focal lesion identified. Within normal limits in parenchymal echogenicity. Portal vein is patent on color Doppler imaging with normal direction of blood flow towards the liver. IVC: No abnormality visualized. Pancreas: Visualized portion unremarkable. Spleen: Size and appearance within normal limits. Right Kidney: Length: 8.6 cm. Echogenicity within normal limits. No mass or hydronephrosis visualized. Left Kidney: Length: 9.5 cm. Echogenicity within normal limits. No mass or hydronephrosis visualized. Abdominal aorta: No aneurysm visualized. Other findings: No ascites. IMPRESSION: 1. Common bile duct is dilated 8.5 mm. Intrahepatic biliary ductal dilatation also noted. Distal common bile duct stone suspected on prior CT of 06/02/2019. Again MRCP may prove useful for further evaluation. 2.  No other abnormality identified.  No ascites. Electronically Signed   By: Marcello Moores  Register   On: 06/08/2019 07:05    Marzetta Board, MD, PhD [facility]  Contact via  [email or web address]  [place] P: [phone] F: [phone]

## 2019-06-08 NOTE — Progress Notes (Signed)
   Vital Signs MEWS/VS Documentation      06/08/2019 0015 06/08/2019 0030 06/08/2019 0121 06/08/2019 0145   MEWS Score:  2  2  2  2    MEWS Score Color:  Yellow  Yellow  Yellow  Yellow   Resp:  14  20  17   -   Pulse:  (!) 116  (!) 126  (!) 115  -   BP:  -  (!) 143/95  (!) 167/83  -   Temp:  -  -  98.4 F (36.9 C)  -   O2 Device:  -  -  Room Air  -   Level of Consciousness:  -  -  -  Alert     April [name] scores a yellow 2 Mews. Trending down from a red 4 Mews previously score in ED this evening. Thjs  pleasant lady April [name] has c/o pain from a fall earlier this past week. No acute distress noted.. Denies CP, SOB, N/V when questioned. [facility], E [place] notified. No new orders obtained at his time. Will continue to monitor and assess this patient needs and concerns.     April [name] 06/08/2019,3:03 AM

## 2019-06-08 NOTE — Progress Notes (Signed)
Pt is NPO for a MRI tonight, they will get her around 20:00.

## 2019-06-09 ENCOUNTER — Inpatient Hospital Stay (HOSPITAL_COMMUNITY): Payer: Medicare Other

## 2019-06-09 DIAGNOSIS — M7989 Other specified soft tissue disorders: Secondary | ICD-10-CM

## 2019-06-09 DIAGNOSIS — Z515 Encounter for palliative care: Secondary | ICD-10-CM

## 2019-06-09 LAB — CBC
HCT: 34.4 % — ABNORMAL LOW (ref 36.0–46.0)
Hemoglobin: 11.1 g/dL — ABNORMAL LOW (ref 12.0–15.0)
MCH: 34.5 pg — ABNORMAL HIGH (ref 26.0–34.0)
MCHC: 32.3 g/dL (ref 30.0–36.0)
MCV: 106.8 fL — ABNORMAL HIGH (ref 80.0–100.0)
Platelets: 139 10*3/uL — ABNORMAL LOW (ref 150–400)
RBC: 3.22 MIL/uL — ABNORMAL LOW (ref 3.87–5.11)
RDW: 14.6 % (ref 11.5–15.5)
WBC: 14.7 10*3/uL — ABNORMAL HIGH (ref 4.0–10.5)
nRBC: 0.1 % (ref 0.0–0.2)

## 2019-06-09 LAB — BASIC METABOLIC PANEL
Anion gap: 10 (ref 5–15)
BUN: 15 mg/dL (ref 8–23)
CO2: 26 mmol/L (ref 22–32)
Calcium: 9.2 mg/dL (ref 8.9–10.3)
Chloride: 100 mmol/L (ref 98–111)
Creatinine, Ser: 0.73 mg/dL (ref 0.44–1.00)
GFR calc Af Amer: >60 mL/min (ref >60)
GFR calc non Af Amer: >60 mL/min (ref >60)
Glucose, Bld: 178 mg/dL — ABNORMAL HIGH (ref 70–99)
Potassium: 4.5 mmol/L (ref 3.5–5.1)
Sodium: 136 mmol/L (ref 135–145)

## 2019-06-09 MED ORDER — SENNA 8.6 MG PO TABS: 1.0000 | ORAL_TABLET | Freq: Every day | ORAL | Status: DC

## 2019-06-09 MED ORDER — OXYCODONE HCL 5 MG PO TABS
5.0000 mg | ORAL_TABLET | Freq: Three times a day (TID) | ORAL | Status: DC
  Administered 2019-06-09 – 2019-06-10 (×3): 5 mg via ORAL
  Filled 2019-06-09 (×3): qty 1

## 2019-06-09 MED ORDER — LIDOCAINE 5 % EX PTCH
1.0000 | MEDICATED_PATCH | CUTANEOUS | Status: DC
  Administered 2019-06-09: 1 via TRANSDERMAL
  Filled 2019-06-09 (×2): qty 1

## 2019-06-09 MED ORDER — PREDNISONE 20 MG PO TABS
20.0000 mg | ORAL_TABLET | Freq: Every day | ORAL | Status: DC
  Administered 2019-06-10: 20 mg via ORAL
  Filled 2019-06-09: qty 1

## 2019-06-09 MED ORDER — ADULT MULTIVITAMIN W/MINERALS CH
1.0000 | ORAL_TABLET | Freq: Every day | ORAL | Status: DC
  Administered 2019-06-09 – 2019-06-10 (×2): 1 via ORAL
  Filled 2019-06-09 (×2): qty 1

## 2019-06-09 MED ORDER — ACETAMINOPHEN 500 MG PO TABS
1000.0000 mg | ORAL_TABLET | Freq: Three times a day (TID) | ORAL | Status: DC
  Administered 2019-06-09 – 2019-06-10 (×3): 1000 mg via ORAL
  Filled 2019-06-09 (×3): qty 2

## 2019-06-09 MED ORDER — BOOST / RESOURCE BREEZE PO LIQD CUSTOM
1.0000 | Freq: Three times a day (TID) | ORAL | Status: DC
  Administered 2019-06-09: 1 via ORAL

## 2019-06-09 NOTE — Progress Notes (Addendum)
PROGRESS NOTE  April [name] [id number] DOB: 04/04/1935 DOA: 06/07/2019 PCP: Isaac Bliss, Rayford Halsted, MD   LOS: 1 day   Brief Narrative / Interim history: 84 year old female with history of CLL currently getting chemotherapy, osteoporosis, recent T8 compression fracture after a fall, presents to the hospital with generalized abdominal pain/mainly on the left side  Subjective / 24h Interval events: Her pain is better controlled this morning, still complains of left-sided abdominal pain but also lower chest pain as well as right lower abdomen pain.  She has also been complaining of bilateral lower extremity swelling  Assessment & Plan: Active Problems:   CLL (chronic lymphocytic leukemia) (HCC)   Thrombocytopenia (HCC)   Essential hypertension   Cancer associated pain   Closed wedge compression fracture of T8 vertebra (HCC)   Sinus tachycardia   Intractable pain   Abdominal distension   Severe pain   Intractable back pain   Principal Problem Severe, intractable cancer related pain -She did have a thoracic vertebra compression fracture, about 30% height loss however her pain is mainly in her abdomen -I have consulted palliative care to assist with pain, appreciate input -Her Dilaudid IV was increased yesterday along with oxycodone, she seems to be a little bit better today.  Discussed with her primary oncologist Dr. Alvy Bimler, she agrees with palliative care consultation for pain management -She does not have much back pain or pain with palpation of her vertebrae   Active Problems Hypertension -Continue home amlodipine, blood pressure controlled  Thrombocytopenia -Likely related to chemotherapy, counts are overall stable today  CLL -White count decreased significantly following chemotherapy, stable today> Wean off prednisone  Possible CBD stone -I wonder whether this could be her source of atypical pain however she has no nausea, vomiting or any other symptoms directly  related to that. -MRCP without clear-cut stone, continue to monitor symptoms.  Bilateral lower extremity swelling, left greater than right -Doppler ultrasound this morning negative for DVT in her lower extremities  Elevated D-dimer, sinus tachycardia -Given malignancy there was concern for PE however CT angiogram was negative  Scheduled Meds:  allopurinol  300 mg Oral Daily   amLODipine  5 mg Oral Daily   enoxaparin (LOVENOX) injection  40 mg Subcutaneous Q24H   polyethylene glycol  17 g Oral BID   predniSONE  20 mg Oral BID WC   sodium chloride flush  3 mL Intravenous Q12H   Continuous Infusions: PRN Meds:.acetaminophen **OR** acetaminophen, HYDROmorphone (DILAUDID) injection, ondansetron **OR** ondansetron (ZOFRAN) IV, oxyCODONE, polyethylene glycol, senna-docusate, triamcinolone cream  DVT prophylaxis: Lovenox Code Status: Full code Family Communication: d/w patient  Disposition Plan: home when ready   Consultants:  Palliative   Procedures:  None   Microbiology  None   Antimicrobials: None     Objective: Vitals:   06/08/19 2352 06/08/19 2354 06/09/19 0208 06/09/19 0653  BP: 138/85  (!) 143/88 (!) 145/81  Pulse: (!) 106 (!) 102 (!) 103 100  Resp:   18 19  Temp:   97.6 F (36.4 C) 97.7 F (36.5 C)  TempSrc:   Oral Oral  SpO2:   95% 98%    Intake/Output Summary (Last 24 hours) at 06/09/2019 1109 Last data filed at 06/09/2019 1031 Gross per 24 hour  Intake 640 ml  Output 800 ml  Net -160 ml   There were no vitals filed for this visit.  Examination:  Constitutional: Appears more comfortable this morning Eyes: No scleral icterus ENMT: Moist mucous membranes Respiratory: Clear bilaterally without wheezing or  crackles, normal respiratory effort Cardiovascular: Regular rate and rhythm, no murmurs.  No edema Abdomen: Positive bowel sounds, no guarding, minimal tenderness throughout Musculoskeletal: no clubbing / cyanosis.  Neurologic: No focal  deficits, equal strength Psychiatric: Normal judgment and insight. Alert and oriented x 3. Normal mood.    Data Reviewed: I have independently reviewed following labs and imaging studies   CBC: Recent Labs  Lab 06/02/19 2223 06/07/19 2130 06/08/19 0526 06/09/19 0454  WBC 155.7* 16.2* 11.9* 14.7*  NEUTROABS 9.1*  --   --   --   HGB 10.8* 11.8* 10.3* 11.1*  HCT 35.9* 36.5 32.7* 34.4*  MCV 110.5* 109.0* 109.0* 106.8*  PLT 174 123* 115* [id number]*   Basic Metabolic Panel: Recent Labs  Lab 06/02/19 2223 06/07/19 2130 06/08/19 0526 06/09/19 0454  NA 136 134* 136 136  K 5.0 4.1 4.1 4.5  CL 103 100 100 100  CO2 25 24 27 26   GLUCOSE 159* 129* 103* 178*  BUN 14 12 11 15   CREATININE 0.69 0.71 0.79 0.73  CALCIUM 9.2 9.0 8.8* 9.2   GFR: Estimated Creatinine Clearance: 39.6 mL/min (by C-G formula based on SCr of 0.73 mg/dL). Liver Function Tests: Recent Labs  Lab 06/08/19 0526  AST 20  ALT 26  ALKPHOS 93  BILITOT 1.7*  PROT 5.8*  ALBUMIN 3.3*   No results for input(s): LIPASE, AMYLASE in the last 168 hours. No results for input(s): AMMONIA in the last 168 hours. Coagulation Profile: No results for input(s): INR, PROTIME in the last 168 hours. Cardiac Enzymes: No results for input(s): CKTOTAL, CKMB, CKMBINDEX, TROPONINI in the last 168 hours. BNP (last 3 results) No results for input(s): PROBNP in the last 8760 hours. HbA1C: No results for input(s): HGBA1C in the last 72 hours. CBG: No results for input(s): GLUCAP in the last 168 hours. Lipid Profile: No results for input(s): CHOL, HDL, LDLCALC, TRIG, CHOLHDL, LDLDIRECT in the last 72 hours. Thyroid Function Tests: No results for input(s): TSH, T4TOTAL, FREET4, T3FREE, THYROIDAB in the last 72 hours. Anemia Panel: No results for input(s): VITAMINB12, FOLATE, FERRITIN, TIBC, IRON, RETICCTPCT in the last 72 hours. Urine analysis:    Component Value Date/Time   COLORURINE DARK YELLOW 05/19/2019 1112   APPEARANCEUR CLEAR  05/19/2019 1112   LABSPEC 1.020 05/19/2019 1112   PHURINE 6.0 05/19/2019 1112   GLUCOSEU NEGATIVE 05/19/2019 1112   HGBUR 3+ (A) 05/19/2019 1112   BILIRUBINUR n 01/23/2016 [place] 05/19/2019 1112   PROTEINUR NEGATIVE 05/19/2019 1112   UROBILINOGEN 0.2 01/23/2016 0927   UROBILINOGEN 0.2 03/30/2014 1203   NITRITE n 01/23/2016 0927   NITRITE NEGATIVE 04/01/2015 1347   LEUKOCYTESUR Negative 01/23/2016 0927   Sepsis Labs: Invalid input(s): PROCALCITONIN, LACTICIDVEN  Recent Results (from the past 240 hour(s))  SARS CORONAVIRUS 2 (TAT 6-24 HRS) Nasopharyngeal Nasopharyngeal Swab     Status: None   Collection Time: 06/07/19 11:14 PM   Specimen: Nasopharyngeal Swab  Result Value Ref Range Status   SARS Coronavirus 2 NEGATIVE NEGATIVE Final    Comment: (NOTE) SARS-CoV-2 target nucleic acids are NOT DETECTED. The SARS-CoV-2 RNA is generally detectable in upper and lower respiratory specimens during the acute phase of infection. Negative results do not preclude SARS-CoV-2 infection, do not rule out co-infections with other pathogens, and should not be used as the sole basis for treatment or other patient management decisions. Negative results must be combined with clinical observations, patient history, and epidemiological information. The expected result is Negative. Fact Sheet  for Patients: [employer] Fact Sheet for Healthcare Providers: https://www.woods-mathews.com/ This test is not yet approved or cleared by the [place] FDA and  has been authorized for detection and/or diagnosis of SARS-CoV-2 by FDA under an Emergency Use Authorization (EUA). This EUA will remain  in effect (meaning this test can be used) for the duration of the COVID-19 declaration under Section 56 4(b)(1) of the Act, 21 U.S.C. section 360bbb-3(b)(1), unless the authorization is terminated or revoked sooner. Performed at [facility],  [place] [address] [place], [place] [address]       Radiology Studies: Dg Chest 2 View  Result Date: 06/07/2019 CLINICAL DATA:  Chest pain EXAM: CHEST - 2 VIEW COMPARISON:  June 02, 2019 FINDINGS: The cardiomediastinal silhouette is unchanged from prior exam with mild cardiomegaly. There is opacity in the right upper lung which is likely due to overlap of shadows from positioning. The left lung is clear. No pleural effusion or focal airspace consolidation. Again noted is diffuse osteopenia and scoliotic curvature. IMPRESSION: Opacity in the right upper lung which is likely thought to be due to overlap of shadows. If clinical concern remains would recommend repeat radiograph. Electronically Signed   By: Prudencio Pair M.D.   On: 06/07/2019 22:13   Ct Angio Chest Pe W Or Wo Contrast  Result Date: 06/08/2019 CLINICAL DATA:  Elevated D-dimer, pain, history CLL, hypertension EXAM: CT ANGIOGRAPHY CHEST WITH CONTRAST TECHNIQUE: Multidetector CT imaging of the chest was performed using the standard protocol during bolus administration of intravenous contrast. Multiplanar CT image reconstructions and MIPs were obtained to evaluate the vascular anatomy. CONTRAST:  155mL OMNIPAQUE IOHEXOL 350 MG/ML SOLN IV COMPARISON:  CT chest 06/02/2019 FINDINGS: Cardiovascular: Atherosclerotic calcifications aorta, proximal great vessels, and minimally in coronary arteries. Aneurysmal dilatation of ascending thoracic aorta 4.1 cm transverse. No evidence of aortic dissection. Pulmonary arteries are suboptimally opacified as well as limited by respiratory motion. No definite large or central pulmonary emboli are visualized. Unable to exclude small and peripheral emboli by this study. No pericardial effusion. Enlargement of cardiac chambers. Mediastinum/Nodes: Esophagus unremarkable. Base of cervical region normal appearance. Lungs/Pleura: Bibasilar atelectasis in the lower lobes. Remaining lungs grossly clear. No pleural effusion or  pneumothorax. Upper Abdomen: Spleen appears prominent in size but incompletely visualized. Remaining visualized upper abdomen unremarkable Musculoskeletal: Significant cervicothoracic scoliosis. Compression deformities of several thoracic vertebra are again seen. Review of the MIP images confirms the above findings. IMPRESSION: No gross evidence of large or central pulmonary emboli is identified on exam limited secondary to suboptimal pulmonary arterial opacification and respiratory motion. Bibasilar atelectasis. Osseous demineralization with compression fractures of several thoracic vertebra. Aneurysmal dilatation ascending thoracic aorta 4.1 cm transverse, recommendation below. Recommend annual imaging followup by CTA or MRA. This recommendation follows 2010 ACCF/AHA/AATS/ACR/ASA/SCA/SCAI/SIR/STS/SVM Guidelines for the Diagnosis and Management of Patients with Thoracic Aortic Disease. Circulation. 2010; [id number]. Aortic aneurysm NOS (ICD10-I71.9) Aortic Atherosclerosis (ICD10-I70.0). Aortic aneurysm NOS (ICD10-I71.9).  For Electronically Signed   By: Lavonia Dana M.D.   On: 06/08/2019 17:12   US Abdomen Complete  Result Date: 06/08/2019 CLINICAL DATA:  Abdominal distention. EXAM: ABDOMEN ULTRASOUND COMPLETE COMPARISON:  CT 06/02/2019. FINDINGS: Gallbladder: No gallstones or wall thickening visualized. No sonographic Murphy sign noted by sonographer. Common bile duct: Diameter: 8.5 mm. Intrahepatic biliary ductal dilatation also noted. Distal common bile duct stone suspected on prior CT. Again MRCP may prove useful for further evaluation. Liver: No focal lesion identified. Within normal limits in parenchymal echogenicity. Portal vein is patent  on color Doppler imaging with normal direction of blood flow towards the liver. IVC: No abnormality visualized. Pancreas: Visualized portion unremarkable. Spleen: Size and appearance within normal limits. Right Kidney: Length: 8.6 cm. Echogenicity within normal  limits. No mass or hydronephrosis visualized. Left Kidney: Length: 9.5 cm. Echogenicity within normal limits. No mass or hydronephrosis visualized. Abdominal aorta: No aneurysm visualized. Other findings: No ascites. IMPRESSION: 1. Common bile duct is dilated 8.5 mm. Intrahepatic biliary ductal dilatation also noted. Distal common bile duct stone suspected on prior CT of 06/02/2019. Again MRCP may prove useful for further evaluation. 2.  No other abnormality identified.  No ascites. Electronically Signed   By: Marcello Moores  Register   On: 06/08/2019 07:05   Mr 3d Recon At Scanner  Result Date: 06/09/2019 CLINICAL DATA:  Abnormal liver function tests with cholelithiasis. EXAM: MRI ABDOMEN WITHOUT AND WITH CONTRAST (INCLUDING MRCP) TECHNIQUE: Multiplanar multisequence MR imaging of the abdomen was performed both before and after the administration of intravenous contrast. Heavily T2-weighted images of the biliary and pancreatic ducts were obtained, and three-dimensional MRCP images were rendered by post processing. CONTRAST:  50mL GADAVIST GADOBUTROL 1 MMOL/ML IV SOLN COMPARISON:  CT abdomen/pelvis 06/02/2019. FINDINGS: Lower chest: The heart is enlarged. Hepatobiliary: 7 mm simple cyst is identified in the anterior liver no suspicious enhancing liver abnormality. Liver measures 16.2 cm craniocaudal length, upper normal. Gallbladder is distended no evidence for gallstones by MRI. Mild intrahepatic biliary duct prominence. Common bile duct in the head of the pancreas measures 7 mm diameter which is upper normal for patient age. The common bile duct shows rather abrupt transition at the ampulla, but no definite evidence for choledocholithiasis. Pancreas: No focal mass lesion. Main pancreatic duct measures 3 mm diameter, upper normal. No intraparenchymal cyst. No peripancreatic edema. Spleen: Spleen is 12.4 cm craniocaudal length. No focal abnormality. Adrenals/Urinary Tract: No adrenal nodule or mass. No suspicious  enhancing abnormality in either kidney. Tiny cortical T2 hyperintensities in the cortex of the kidneys without definite enhancement suggest tiny cortical cyst. Central sinus cysts are noted in the kidneys bilaterally, left greater than right. Stomach/Bowel: Stomach is unremarkable. No gastric wall thickening. No evidence of outlet obstruction. Duodenum is normally positioned as is the ligament of Treitz. No small bowel or colonic dilatation within the visualized abdomen. Vascular/Lymphatic: No abdominal aortic aneurysm. No abdominal lymphadenopathy Other: Fluid-filled structure in the central pelvis is been incompletely visualized but is presumably a markedly dilated urinary bladder. Musculoskeletal: No abnormal marrow enhancement within the visualized bony anatomy IMPRESSION: 1. Mild intra and extrahepatic biliary duct prominence with common bile duct measuring upper normal for patient age. No gallstones evident and no definite choledocholithiasis. Somewhat abrupt transition of the duct into the ampulla noted, but nonspecific. ERCP could be used to further evaluate for ampullary lesion as clinically warranted. 2. Hepatic and renal cysts. 3. Presumed marked distention of the urinary bladder. Electronically Signed   By: Misty Stanley M.D.   On: 06/09/2019 07:58   Mr Abdomen Mrcp Moise Boring Contast  Result Date: 06/09/2019 CLINICAL DATA:  Abnormal liver function tests with cholelithiasis. EXAM: MRI ABDOMEN WITHOUT AND WITH CONTRAST (INCLUDING MRCP) TECHNIQUE: Multiplanar multisequence MR imaging of the abdomen was performed both before and after the administration of intravenous contrast. Heavily T2-weighted images of the biliary and pancreatic ducts were obtained, and three-dimensional MRCP images were rendered by post processing. CONTRAST:  51mL GADAVIST GADOBUTROL 1 MMOL/ML IV SOLN COMPARISON:  CT abdomen/pelvis 06/02/2019. FINDINGS: Lower chest: The heart is enlarged. Hepatobiliary:  7 mm simple cyst is identified in  the anterior liver no suspicious enhancing liver abnormality. Liver measures 16.2 cm craniocaudal length, upper normal. Gallbladder is distended no evidence for gallstones by MRI. Mild intrahepatic biliary duct prominence. Common bile duct in the head of the pancreas measures 7 mm diameter which is upper normal for patient age. The common bile duct shows rather abrupt transition at the ampulla, but no definite evidence for choledocholithiasis. Pancreas: No focal mass lesion. Main pancreatic duct measures 3 mm diameter, upper normal. No intraparenchymal cyst. No peripancreatic edema. Spleen: Spleen is 12.4 cm craniocaudal length. No focal abnormality. Adrenals/Urinary Tract: No adrenal nodule or mass. No suspicious enhancing abnormality in either kidney. Tiny cortical T2 hyperintensities in the cortex of the kidneys without definite enhancement suggest tiny cortical cyst. Central sinus cysts are noted in the kidneys bilaterally, left greater than right. Stomach/Bowel: Stomach is unremarkable. No gastric wall thickening. No evidence of outlet obstruction. Duodenum is normally positioned as is the ligament of Treitz. No small bowel or colonic dilatation within the visualized abdomen. Vascular/Lymphatic: No abdominal aortic aneurysm. No abdominal lymphadenopathy Other: Fluid-filled structure in the central pelvis is been incompletely visualized but is presumably a markedly dilated urinary bladder. Musculoskeletal: No abnormal marrow enhancement within the visualized bony anatomy IMPRESSION: 1. Mild intra and extrahepatic biliary duct prominence with common bile duct measuring upper normal for patient age. No gallstones evident and no definite choledocholithiasis. Somewhat abrupt transition of the duct into the ampulla noted, but nonspecific. ERCP could be used to further evaluate for ampullary lesion as clinically warranted. 2. Hepatic and renal cysts. 3. Presumed marked distention of the urinary bladder. Electronically  Signed   By: Misty Stanley M.D.   On: 06/09/2019 07:58   Vas [place] Lower Extremity Venous (dvt)  Result Date: 06/09/2019  Lower Venous Study Indications: Swelling.  Comparison Study: no prior Performing Technologist: Abram Sander RVS  Examination Guidelines: A complete evaluation includes B-mode imaging, spectral Doppler, color Doppler, and power Doppler as needed of all accessible portions of each vessel. Bilateral testing is considered an integral part of a complete examination. Limited examinations for reoccurring indications may be performed as noted.  +---------+---------------+---------+-----------+----------+--------------+  RIGHT     Compressibility Phasicity Spontaneity Properties Thrombus Aging  +---------+---------------+---------+-----------+----------+--------------+  CFV       Full            Yes       Yes                                    +---------+---------------+---------+-----------+----------+--------------+  SFJ       Full                                                             +---------+---------------+---------+-----------+----------+--------------+  FV Prox   Full                                                             +---------+---------------+---------+-----------+----------+--------------+  FV Mid    Full                                                             +---------+---------------+---------+-----------+----------+--------------+  FV Distal Full                                                             +---------+---------------+---------+-----------+----------+--------------+  PFV       Full                                                             +---------+---------------+---------+-----------+----------+--------------+  POP       Full            Yes       Yes                                    +---------+---------------+---------+-----------+----------+--------------+  PTV       Full                                                              +---------+---------------+---------+-----------+----------+--------------+  PERO      Full                                                             +---------+---------------+---------+-----------+----------+--------------+   +---------+---------------+---------+-----------+----------+--------------+  LEFT      Compressibility Phasicity Spontaneity Properties Thrombus Aging  +---------+---------------+---------+-----------+----------+--------------+  CFV       Full            Yes       Yes                                    +---------+---------------+---------+-----------+----------+--------------+  SFJ       Full                                                             +---------+---------------+---------+-----------+----------+--------------+  FV Prox   Full                                                             +---------+---------------+---------+-----------+----------+--------------+  FV Mid    Full                                                             +---------+---------------+---------+-----------+----------+--------------+  FV Distal Full                                                             +---------+---------------+---------+-----------+----------+--------------+  PFV       Full                                                             +---------+---------------+---------+-----------+----------+--------------+  POP       Full            Yes       Yes                                    +---------+---------------+---------+-----------+----------+--------------+  PTV       Full                                                             +---------+---------------+---------+-----------+----------+--------------+  PERO      Full                                                             +---------+---------------+---------+-----------+----------+--------------+     Summary: Right: There is no evidence of deep vein thrombosis in the lower extremity. No cystic structure found in the  popliteal fossa. Left: There is no evidence of deep vein thrombosis in the lower extremity. No cystic structure found in the popliteal fossa.  *See table(s) above for measurements and observations.    Preliminary     Marzetta Board, MD, PhD [facility]  Contact via  [email or web address]  [place] P: [phone] F: [phone]

## 2019-06-09 NOTE — TOC Initial Note (Signed)
Transition of Care [facility]) - Initial/Assessment Note    Patient Details  Name: April [name] MRN: [id number] Date of Birth: 07/08/1935  Transition of Care [facility]) CM/SW Contact:    Norvel Wenker, Marjie Skiff, RN Phone [phone] 06/09/2019, 3:33 PM  Clinical Narrative:                 CM consult for home health and Outpatient palliative services. Choice offered and referrals made to [facility] and [facility]. Pt also requesting RW and 3in1. Will need MD orders for all above services.   Expected Discharge Plan: [place] Barriers to Discharge: Continued Medical Work up   Patient Goals and CMS Choice Patient states their goals for this hospitalization and ongoing recovery are:: to get stronger CMS Medicare.gov Compare Post Acute Care list provided to:: Patient Choice offered to / list presented to : Patient  Expected Discharge Plan and Services Expected Discharge Plan: [place]   Discharge Planning Services: CM Consult Post Acute Care Choice: Home Health(Palliative outpatient) Living arrangements for the past 2 months: Single Family Home                 DME Arranged: 3-N-1, Walker rolling         HH Arranged: PT HH Agency: [place] Date [place]: 06/09/19 Time [place]: [id number] Representative spoke with at [place]: Tommi Rumps and [place]  Prior Living Arrangements/Services Living arrangements for the past 2 months: [place] Lives with:: Spouse Patient language and need for interpreter reviewed:: Yes Do you feel safe going back to the place where you live?: Yes      Need for Family Participation in Patient Care: Yes (Comment) Care giver support system in place?: Yes (comment)   Criminal Activity/Legal Involvement Pertinent to Current Situation/Hospitalization: No - Comment as needed  Activities of Daily Living Home Assistive Devices/Equipment: None ADL  Screening (condition at time of admission) Patient's cognitive ability adequate to safely complete daily activities?: Yes Is the patient deaf or have difficulty hearing?: No Does the patient have difficulty seeing, even when wearing glasses/contacts?: No Does the patient have difficulty concentrating, remembering, or making decisions?: No Patient able to express need for assistance with ADLs?: Yes Does the patient have difficulty dressing or bathing?: No Independently performs ADLs?: Yes (appropriate for developmental age) Does the patient have difficulty walking or climbing stairs?: No Weakness of Legs: Both Weakness of Arms/Hands: None  Permission Sought/Granted Permission sought to share information with : Chartered certified accountant granted to share information with : Yes, Release of Information Signed     Permission granted to share info w AGENCY: [facility] and [facility]        Emotional Assessment Appearance:: Appears stated age     Orientation: : Oriented to Self, Oriented to Place, Oriented to  Time, Oriented to Situation      Admission diagnosis:  CLL (chronic lymphocytic leukemia) (HCC) [C91.10] Intractable pain [R52] Closed wedge compression fracture of T8 vertebra, initial encounter ([place]) [S22.060A] Severe pain [R52] Patient Active Problem List   Diagnosis Date Noted  . Abdominal distension 06/08/2019  . Severe pain 06/08/2019  . Intractable back pain 06/08/2019  . Closed wedge compression fracture of T8 vertebra ([place]) 06/07/2019  . Sinus tachycardia 06/07/2019  . Intractable pain 06/07/2019  . Goals of care, counseling/discussion 05/14/2019  . Abnormal liver enzymes 05/14/2019  . Dehydration 05/14/2019  . Failure to thrive in adult  05/14/2019  . Abdominal pain 04/16/2019  . Cancer associated pain 04/16/2019  . Breast cancer ([place]) 06/19/2018  . Skin lesion 05/06/2017  . Essential hypertension 12/14/2016  . Systolic hypertension, isolated  11/16/2016  . Central retinal vein occlusion 10/18/2016  . Preventive measure 04/28/2015  . Thrombocytopenia ([place]) 04/28/2015  . Idiopathic scoliosis 10/23/2012  . CLL (chronic lymphocytic leukemia) ([place]) 03/02/2009  . VENOUS INSUFFICIENCY, CHRONIC 10/28/2008  . Dyslipidemia 10/27/2007  . Osteoporosis 10/27/2007  . SKIN CANCER, HX OF 10/27/2007  . DIVERTICULITIS, HX OF 04/08/2007   PCP:  Isaac Bliss, Rayford Halsted, MD Pharmacy:   [id number] - [place], [address] [place] [address] Phone: [phone] Fax: [phone]  [place], [place] - [address] [place] [address] Phone: [phone] Fax: [phone]     Social Determinants of Health ([place]) Interventions    Readmission Risk Interventions Readmission Risk Prevention Plan 06/09/2019  Transportation Screening Complete  PCP or Specialist Appt within 5-7 Days Complete  Home Care Screening Complete  Medication Review (RN CM) Complete  Some recent data might be hidden

## 2019-06-09 NOTE — Progress Notes (Signed)
Initial Nutrition Assessment  INTERVENTION:   -Boost Breeze po TID, each supplement provides 250 kcal and 9 grams of protein -Magic cup BID with meals, each supplement provides 290 kcal and 9 grams of protein -Multivitamin with minerals daily -Recommend measure weight for this admission (last recorded 11/3).  NUTRITION DIAGNOSIS:   Increased nutrient needs related to cancer and cancer related treatments as evidenced by estimated needs.  GOAL:   Patient will meet greater than or equal to 90% of their needs  MONITOR:   PO intake, Supplement acceptance, Labs, Weight trends, I & O's  REASON FOR ASSESSMENT:   Malnutrition Screening Tool    ASSESSMENT:   84 year old female with history of CLL currently getting chemotherapy, osteoporosis, recent T8 compression fracture after a fall, presents to the hospital with generalized abdominal pain/mainly on the left side  **RD working remotely**  Patient continues with poor appetite related to pain and chemo for CLL. Last chemo was 11/3.  Currently pt is consuming 25-75% of meals. RD will order Boost Breeze and Magic Cup supplements for additional kcals and protein.   Per weight records, last recorded weight was from 11/3. No weight for this admission. Prior to 11/3, weights were stable. I/Os: -437 ml since admit UOP: 1.1L x 24 hrs  Labs reviewed. Medications reviewed.  NUTRITION - FOCUSED PHYSICAL EXAM:  Working remotely.  Diet Order:   Diet Order            Diet regular Room service appropriate? Yes; Fluid consistency: Thin  Diet effective now              EDUCATION NEEDS:   No education needs have been identified at this time  Skin:  Skin Assessment: Reviewed RN Assessment  Last BM:  11/10 -type 4  Height:   Ht Readings from Last 1 Encounters:  05/25/19 5' (1.524 m)    Weight:   Wt Readings from Last 1 Encounters:  06/02/19 46.9 kg    Ideal Body Weight:  45.5 kg  BMI: 20.1 kg/m^2  Estimated  Nutritional Needs:   Kcal:  1450-1650  Protein:  70-80g  Fluid:  1.6L/day  Clayton Bibles, MS, RD, LDN Inpatient Clinical Dietitian Pager: [phone] After Hours Pager: [phone]

## 2019-06-09 NOTE — Evaluation (Signed)
Physical Therapy Evaluation Patient Details Name: April [name] MRN: [id number] DOB: 03/11/1935 Today's Date: 06/09/2019   History of Present Illness  Patient is 84 year old female with history of CLL currently getting chemotherapy, osteoporosis, recent T8 compression fracture after a fall, presents to the hospital with generalized abdominal pain/mainly on the left side.    Clinical Impression  April [name] is 84 y.o. female admitted with above HPI and diagnosis. Patient is currently limited by functional impairments below (see PT problem list). Patient lives with her husband and is independent at baseline. She has had 1 fall in the last 6 months resulting in recent T8 compression fracture. She required min guard for safety with trasnfers and gait this date. Patient will benefit from continued skilled PT interventions to address impairments and progress independence with mobility, recommending HHPT. Acute PT will follow and progress as able.     Follow Up Recommendations Home health PT    Equipment Recommendations  None recommended by PT    Recommendations for Other Services       Precautions / Restrictions Precautions Precautions: Fall Restrictions Weight Bearing Restrictions: No      Mobility  Bed Mobility Overal bed mobility: Needs Assistance Bed Mobility: Supine to Sit;Sit to Supine     Supine to sit: Supervision;HOB elevated Sit to supine: Supervision;HOB elevated   General bed mobility comments: HOB slightly elevated, pt slow wtih movement secondary to weakness and pain, no assistance needed. verbal cues to scoot to EOB.  Transfers Overall transfer level: Needs assistance Equipment used: None Transfers: Sit to/from [facility] Sit to Stand: Min guard Stand pivot transfers: Min guard       General transfer comment: pt performed sit<>stand from EOB, min guard for safety as pt slightly unsteady upon initial rise. pt performed stand pivto from  recliner to bed at EOS, no overt LOB noted.  Ambulation/Gait Ambulation/Gait assistance: Min guard Gait Distance (Feet): 180 Feet Assistive device: None Gait Pattern/deviations: Step-through pattern;Decreased step length - right;Decreased step length - left;Narrow base of support;Decreased stride length;Trunk flexed Gait velocity: decreased   General Gait Details: MIN/guard for safety, No overt LOB noted. pt's HR remained between 110-118 bpm with gait.  Stairs            Wheelchair Mobility    Modified Rankin (Stroke Patients Only)       Balance Overall balance assessment: Needs assistance Sitting-balance support: Feet supported;No upper extremity supported Sitting balance-Leahy Scale: Good     Standing balance support: During functional activity;No upper extremity supported Standing balance-Leahy Scale: Good Standing balance comment: pt slightly unsteady with no device for gait            Pertinent Vitals/Pain Pain Assessment: Faces Faces Pain Scale: Hurts little more Pain Location: Lt side and back with movement Pain Descriptors / Indicators: Aching;Sore Pain Intervention(s): Limited activity within patient's tolerance;Monitored during session    [place] expects to be discharged to:: Private residence Living Arrangements: Spouse/significant other Available Help at Discharge: Family;Available PRN/intermittently(pt's spouse can assist but is limited with weakness himself. her neice can assist intermittently as she lives in [place]. she can stay with her for a couple of days initially.) Type of Home: House Home Access: Stairs to enter Entrance Stairs-Rails: Right;None Technical brewer of Steps: steps in front (2) no rails; pt is waiting on someone to put a railing in at the front, side entrance into kitche (10) steps with secure rail on Rt going up(pt typically uses the front  and her husband assists her to keep her balance) Home  Layout: Bed/bath upstairs;Full bath on main level;Able to live on main level with bedroom/bathroom;Two level;Laundry or work area in [place]: Civil engineer, contracting - built in;Grab bars - tub/shower;Walker - 4 wheels;Cane - single point Additional Comments: pt's walk-in shower is upstairs and she has a tub/shower on main floor with guest room. no shower seat in downstairs shower but it does have grab bars    Prior Function Level of Independence: Independent         Comments: pt reports she has been thinking about finding a spot at an independent or assisted living residence for her and her husband     Hand Dominance   Dominant Hand: Right    Extremity/Trunk Assessment   Upper Extremity Assessment Upper Extremity Assessment: Generalized weakness;Overall [facility] for tasks assessed    Lower Extremity Assessment Lower Extremity Assessment: Generalized weakness    Cervical / Trunk Assessment Cervical / Trunk Assessment: Kyphotic Cervical / Trunk Exceptions: limited cervical ROM with forward head posture and kyphotic thoracic spine  Communication   Communication: No difficulties  Cognition Arousal/Alertness: Awake/alert Behavior During Therapy: WFL for tasks assessed/performed Overall Cognitive Status: Within Functional Limits for tasks assessed         General Comments General comments (skin integrity, edema, etc.): educated pt to obtain shower seat or tub bench for downstairs shower as it does not have one. pt and neice will look into getting one.    Exercises Other Exercises Other Exercises: 5x sit<>stad: pt performed with UE's to initaite power up from recliner, 47.5 seconds to complete, pt slow with transfers to prevent back pain.   Assessment/Plan    PT Assessment Patient needs continued PT services  PT Problem List Decreased strength;Decreased activity tolerance;Decreased balance;Decreased mobility;Decreased knowledge of precautions       PT Treatment  Interventions Gait training;Stair training;Functional mobility training;Balance training;Therapeutic exercise;Therapeutic activities;Patient/family education;DME instruction    PT Goals (Current goals can be found in the Care Plan section)  Acute Rehab PT Goals Patient Stated Goal: to get out of the hospital and firgure out how to get more assistance at home or look into ALF PT Goal Formulation: With patient Time For Goal Achievement: 06/23/19 Potential to Achieve Goals: Good    Frequency Min 3X/week   Barriers to discharge Decreased caregiver support;Inaccessible home environment pt has multi-level home with stairs to enter, no rail at front entrance; she has limited assistace from her family and her husband       AM-PAC PT "6 Clicks" Mobility  Outcome Measure Help needed turning from your back to your side while in a flat bed without using bedrails?: A Little Help needed moving from lying on your back to sitting on the side of a flat bed without using bedrails?: A Little Help needed moving to and from a bed to a chair (including a wheelchair)?: A Little Help needed standing up from a chair using your arms (e.g., wheelchair or bedside chair)?: A Little Help needed to walk in hospital room?: A Little Help needed climbing 3-5 steps with a railing? : A Little 6 Click Score: 18    End of Session Equipment Utilized During Treatment: Gait belt Activity Tolerance: Patient limited by pain Patient left: with call bell/phone within reach;in bed;with bed alarm set;with family/visitor present Nurse Communication: Mobility status PT Visit Diagnosis: Muscle weakness (generalized) (M62.81);Difficulty in walking, not elsewhere classified (R26.2)    Time: [id number] PT Time Calculation (min) (ACUTE ONLY):  41 min   Charges:   PT Evaluation $PT Eval Low Complexity: 1 Low PT Treatments $Gait Training: 8-22 mins        Kipp Brood, PT, DPT Physical Therapist with [facility]  06/09/2019 2:28 PM

## 2019-06-09 NOTE — Progress Notes (Signed)
Lower extremity venous has been completed.   Preliminary results in CV Proc.   Abram Sander 06/09/2019 9:17 AM

## 2019-06-09 NOTE — Consult Note (Signed)
Consultation Note Date: 06/09/2019   Patient Name: April [name]  DOB: 09/27/1934  MRN: [id number]  Age / Sex: 84 y.o., female  PCP: Isaac Bliss, Rayford Halsted, MD Referring Physician: Caren Griffins, MD  Reason for Consultation: Pain control  HPI/Patient Profile: 84 y.o. female  with past medical history of CLL, osteoporosis, recent T8 compression fracture, HTN, h/o breast cancer admitted on 06/07/2019 with worsening "torso" pain.   Clinical Assessment and Goals of Care: I met today with April [name] and niece, Lattie Haw, at bedside. Her pain is much improved today but for no specific reason that we can decide. She has been walking in the hallway and was doing very well. Pain is worse at night and with activity and is pain in abd (particularly left side) but also into chest at times. Pain seems to be worse around times of chemo and was present prior to her fall and T8 compression fracture although this could have triggered an escalation of pain. Seems from history that her T8 fracture hurts at night when she is lying down. Denies any components of neuropathic pain.   I discussed with April [name] regarding her pain management and the difficulty since she is pain free today and no PRN since 2 am. However, her greatest fear is that when the pain comes it begins abrupt and severe and is difficult to manage after it has begun. We discussed some scheduled OxyIR to assess for tolerance and efficacy. I do not want to jump right into long acting but I did discuss this option with her. Will keep her OxyIR PRN as well. Also adding scheduled Tylenol. Will add Lidoderm patch to T8 back. May have some options for long acting opioid, higher dose qhs if nighttime is worse, and escalation of dosage/frequency of OxyIR OR rotate opioids to po dilaudid. I explained that we have many options to work with to help her find relief but it may take  some time to find what works best for her. She also agrees to outpatient palliative for support and continued assistance with pain recommendations.   Primary Decision Maker PATIENT    SUMMARY OF RECOMMENDATIONS   - Pain management recs below - She does have a Living Will and HCPOA (husband is becoming a little forgetful but her niece Lattie Haw is involved and HCPOA along with husband) - Outpatient palliative desired by patient for follow up and support  Code Status/Advance Care Planning:  Full code - did not discuss today   Symptom Management:   Pain r/t CLL:   Tylenol 1000 TID.   OxyIR 5 mg TID. May consider increased dose for qhs dose since pain worse at night. May adjust dosage/frequency as needed. Could consider long acting opioid if this seems helpful.   OxyIR 10 mg every 4 hours prn moderate pain.   Dilaudid 1 mg every 2 hours prn severe pain.   Pain r/t T8 compression fracture: Lidoderm patch.   Constipation: Senokot 1 tablet qhs.   Palliative Prophylaxis:   Bowel Regimen, Delirium  Protocol and Frequent Pain Assessment  Psycho-social/Spiritual:   Desire for further Chaplaincy support:yes  Additional Recommendations: Caregiving  Support/Resources  Prognosis:   Overall prognosis is poor with incurable cancer on palliative treatments. Prognosis grim if unable to tolerate treatments.   Discharge Planning: Home with Palliative Services      Primary Diagnoses: Present on Admission:  Essential hypertension  Thrombocytopenia (HCC)  Cancer associated pain  Severe pain  Intractable back pain   I have reviewed the medical record, interviewed the patient and family, and examined the patient. The following aspects are pertinent.  Past Medical History:  Diagnosis Date   Breast CA ([place])    Bronchitis, chronic (HCC)    Eye hemorrhage, left    Leukemia, chronic lymphoid (HCC)    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HCC)    INCREASED RISK FOR LYMPHOMA   Osteoporosis  05/2018   T score -3.1 overall stable from prior study   Social History   Socioeconomic History   Marital status: Married    Spouse name: Not on file   Number of children: Not on file   Years of education: Not on file   Highest education level: Not on file  Occupational History   Not on file  Social Needs   Financial resource strain: Not on file   Food insecurity    Worry: Not on file    Inability: Not on file   Transportation needs    Medical: Not on file    Non-medical: Not on file  Tobacco Use   Smoking status: Former Smoker    Types: Cigarettes    Quit date: 03/26/1955    Years since quitting: 64.2   Smokeless tobacco: Never Used  Substance and Sexual Activity   Alcohol use: Yes    Alcohol/week: 5.0 standard drinks    Types: 5 Standard drinks or equivalent per week    Comment: wine   Drug use: No   Sexual activity: Never    Partners: Male    Birth control/protection: Surgical    Comment: HYST-1st intercourse 84 yo-More than 5 partners  Lifestyle   Physical activity    Days per week: Not on file    Minutes per session: Not on file   Stress: Not on file  Relationships   Social connections    Talks on phone: Not on file    Gets together: Not on file    Attends religious service: Not on file    Active member of club or organization: Not on file    Attends meetings of clubs or organizations: Not on file    Relationship status: Not on file  Other Topics Concern   Not on file  Social History Narrative   Not on file   Family History  Problem Relation Age of Onset   Cancer Brother        HODGKINS   Cancer Brother        leukemia   Parkinson's disease Brother    Heart disease Mother    Heart disease Sister    Scheduled Meds:  allopurinol  300 mg Oral Daily   amLODipine  5 mg Oral Daily   enoxaparin (LOVENOX) injection  40 mg Subcutaneous Q24H   polyethylene glycol  17 g Oral BID   predniSONE  20 mg Oral BID WC   sodium  chloride flush  3 mL Intravenous Q12H   Continuous Infusions: PRN Meds:.acetaminophen **OR** acetaminophen, HYDROmorphone (DILAUDID) injection, ondansetron **OR** ondansetron (ZOFRAN) IV, oxyCODONE, polyethylene glycol, senna-docusate, triamcinolone cream Allergies  Allergen Reactions   Codeine Nausea Only   Levofloxacin Rash   Review of Systems  Constitutional: Positive for activity change. Negative for appetite change.  Respiratory: Negative for shortness of breath.   Gastrointestinal: Positive for abdominal pain.       Issues with constipation but not currently    Physical Exam Vitals signs and nursing note reviewed.  Constitutional:      General: She is not in acute distress.    Comments: Frail, elderly  Cardiovascular:     Rate and Rhythm: Normal rate.  Pulmonary:     Effort: Pulmonary effort is normal. No tachypnea, accessory muscle usage or respiratory distress.  Abdominal:     General: Abdomen is flat.  Neurological:     Mental Status: She is alert and oriented to person, place, and time.     Vital Signs: BP (!) 145/81 (BP Location: Left Arm)    Pulse 100    Temp 97.7 F (36.5 C) (Oral)    Resp 19    LMP 03/26/1979    SpO2 98%  Pain Scale: 0-10   Pain Score: Asleep   SpO2: SpO2: 98 % O2 Device:SpO2: 98 % O2 Flow Rate: .   IO: Intake/output summary:   Intake/Output Summary (Last 24 hours) at 06/09/2019 1051 Last data filed at 06/09/2019 1031 Gross per 24 hour  Intake 640 ml  Output 800 ml  Net -160 ml    LBM: Last BM Date: 06/07/19 Baseline Weight:   Most recent weight:       Palliative Assessment/Data:     Time In: 1300 Time Out: 1410 Time Total: 70 min Greater than 50%  of this time was spent counseling and coordinating care related to the above assessment and plan.  Signed by: Vinie Sill, NP Palliative Medicine Team Pager # [phone] (M-F 8a-5p) Team Phone # [phone] (Nights/Weekends)

## 2019-06-09 NOTE — Progress Notes (Signed)
Manufacturing engineer [facility]) Hospital Liaison Note:  ? Received Outpatient Palliative Care Referral for patient upon discharge. Spoke with patient to confirm interest and patient requested information to be shared with niece. Reached out to Lattie Haw (niece) to explain our Palliative Care program, collect information and answer questions. [facility] Palliative Care Contact information given to niece.   ? [facility] will continue to follow while hospitalized. Our Palliative team will follow up with her after discharge.?  ? If you have any questions please contact us.?  ? Thank you,  Gar Ponto, RN [facility] HLT  [phone]

## 2019-06-10 DIAGNOSIS — D696 Thrombocytopenia, unspecified: Secondary | ICD-10-CM

## 2019-06-10 DIAGNOSIS — S22060A Wedge compression fracture of T7-T8 vertebra, initial encounter for closed fracture: Secondary | ICD-10-CM

## 2019-06-10 LAB — BASIC METABOLIC PANEL
Anion gap: 7 (ref 5–15)
BUN: 14 mg/dL (ref 8–23)
CO2: 27 mmol/L (ref 22–32)
Calcium: 9.2 mg/dL (ref 8.9–10.3)
Chloride: 102 mmol/L (ref 98–111)
Creatinine, Ser: 0.74 mg/dL (ref 0.44–1.00)
GFR calc Af Amer: >60 mL/min (ref >60)
GFR calc non Af Amer: >60 mL/min (ref >60)
Glucose, Bld: 107 mg/dL — ABNORMAL HIGH (ref 70–99)
Potassium: 4.1 mmol/L (ref 3.5–5.1)
Sodium: 136 mmol/L (ref 135–145)

## 2019-06-10 LAB — CBC
HCT: 32.5 % — ABNORMAL LOW (ref 36.0–46.0)
Hemoglobin: 10.7 g/dL — ABNORMAL LOW (ref 12.0–15.0)
MCH: 35.7 pg — ABNORMAL HIGH (ref 26.0–34.0)
MCHC: 32.9 g/dL (ref 30.0–36.0)
MCV: 108.3 fL — ABNORMAL HIGH (ref 80.0–100.0)
Platelets: 122 10*3/uL — ABNORMAL LOW (ref 150–400)
RBC: 3 MIL/uL — ABNORMAL LOW (ref 3.87–5.11)
RDW: 14.9 % (ref 11.5–15.5)
WBC: 9 10*3/uL (ref 4.0–10.5)
nRBC: 0 % (ref 0.0–0.2)

## 2019-06-10 MED ORDER — OXYCODONE HCL 5 MG PO TABS: 5.0000 mg | ORAL_TABLET | Freq: Three times a day (TID) | ORAL | 0 refills | Status: DC

## 2019-06-10 MED ORDER — ACETAMINOPHEN 500 MG PO TABS: 1000.0000 mg | ORAL_TABLET | Freq: Three times a day (TID) | ORAL | 11 refills | Status: AC

## 2019-06-10 MED ORDER — OXYCODONE HCL 10 MG PO TABS: 10.0000 mg | ORAL_TABLET | Freq: Three times a day (TID) | ORAL | 0 refills | Status: DC | PRN

## 2019-06-10 MED ORDER — LIDOCAINE 5 % EX PTCH: 1.0000 | MEDICATED_PATCH | Freq: Two times a day (BID) | CUTANEOUS | 0 refills | Status: AC

## 2019-06-10 MED ORDER — POLYETHYLENE GLYCOL 3350 17 G PO PACK: PACK | ORAL | 0 refills | Status: DC

## 2019-06-10 MED ORDER — SENNOSIDES-DOCUSATE SODIUM 8.6-50 MG PO TABS: ORAL_TABLET | ORAL | 0 refills | Status: DC

## 2019-06-10 NOTE — Progress Notes (Signed)
Palliative:  HPI: 84 y.o. female  with past medical history of CLL, osteoporosis, recent T8 compression fracture, HTN, h/o breast cancer admitted on 06/07/2019 with worsening "torso" pain. Pain most likely s/t malignancy.   I spoke with Dr. Cyndia Skeeters today regarding pain meds for d/c (recs listed below). I met today briefly with Ms. Plunk prior to discharge and she had some pain this morning and yesterday evening but this seemed to be better controlled after a dose of OxyIR than previously. Fortunately (and unfortunately) her pain has been well controlled in the brief times I have visited with her so this has made it difficult to have good recommendations to manage better at home. I reviewed recommendations with Ms. Terrero (I will call niece tonight or in the am to clarify if any questions or concerns) I will have my RN call and assist outpatient palliative to get connected sooner than later. I have informed her to reach out to Dr. Alvy Bimler for any pain needs after discharge.   All questions/concerns addressed. Emotional support provided.   Plan:  Pain r/t CLL:  ? Tylenol 1000 TID.  ? OxyIR 5 mg TID. May consider increased dose for qhs dose since pain worse at night. May adjust dosage/frequency as needed. Could consider long acting opioid if this seems helpful.  ? OxyIR 10 mg every 4 hours prn moderate pain.   Pain r/t T8 compression fracture: Lidoderm patch.   Constipation: Senokot 1 tablet qhs.   Outpatient palliative to follow for support and symptom recommendations.   15 min  Vinie Sill, NP Palliative Medicine Team Pager [phone] (Please see [email or web address] for schedule) Team Phone [phone]

## 2019-06-10 NOTE — Discharge Summary (Signed)
Physician Discharge Summary  April [name] [id number] DOB: 01/18/1935 DOA: 06/07/2019  PCP: April [name], April Halsted, MD  Admit date: 06/07/2019 Discharge date: 06/10/2019  Admitted From: Home Disposition: Home  Recommendations for Outpatient Follow-up:  1. Follow up with PCP in 1-2 weeks as below 2. Oncology to arrange outpatient follow-up 3. Hospice and palliative to follow follow-up as below 4. Please obtain CBC/BMP/Mag at follow up 5. Please follow up on the following pending results: None  Home Health: PT Equipment/Devices: Rolling walker and 3 in 1 "  Discharge Condition: Stable CODE STATUS: Full code  Follow-up Information    April [name], [facility] Follow up.   Specialty: Hospice and Palliative Medicine Why: New name of [facility]. For home palliative services. Contact information: [place] [address] [phone]        Care, April [name] Follow up.   Specialty: Home Health Services Why: For home physical therapy Contact information: [address] [place] [address] [phone]        April Lark, MD. Schedule an appointment as soon as possible for a visit in 2 week(s).   Specialty: Hematology and Oncology Contact information: [place] [address] [phone]           HPI: Per Dr. Darien [name] April [name] is a 84 y.o. female with medical history significant of CLL, osteoporosis, recent T8 compression fracture, hypertension who presents to [name] for severe torso pain.  She states that she has had this pain since starting her chemotherapy regimen however the pain began getting worse on Tuesday after her last chemotherapy treatment with an associated fall.  At that time she had her oxycodone increased from 5 mg every 4 hours to 10 mg every 4 hours.  She reports this did not improve her severe pain.  She reports the pain is a stabbing pain that is located  throughout her torso and she is unable to pinpoint the pain due to the current severity.  She did receive IV Dilaudid in the emergency room and reports that her pain is much improved currently.  Palpation of her abdomen makes her pain worse as does any sort of quick/jarring movement.  She denies any other associated symptoms.  Of note, the patient was recently in the emergency room on 11/3 following her fall where she was diagnosed with the T8 compression fracture and was having similar pain at that time.    ED Course: Patient received IV fluid and 1 mg of IV Dilaudid x2 approximately 1 hour apart with steady improvement in her pain.  Patient is admitted to hospitalist service in observation status for intractable pain related to her malignancy.  Review of Systems: As per HPI otherwise 10 point review of systems negative.   Hospital Course: Patient with past medical history as above admitted with severe intractable cancer-related pain.  Oncology and palliative care consulted.  Palliative care adjusted pain medications.  She has been discharged on Tylenol 1000 mg 3 times daily, oxycodone IR 5 mg 3 times daily and oxycodone IR 10 mg twice daily as needed for severe breakthrough pain.  Discussed the importance of good bowel regimen.  Patient to follow-up withpalliative care outpatient for further adjustment to her medications.  Patient was evaluated by PT prior to discharge.  Home health PT, rolling walker 3 in 1 commode recommended, ordered prior to discharge.  Seen major problem list below more on hospital course.  Subjective: No major issues overnight  of this morning.  Continues to endorse left-sided chest wall pain.  Pain was initially 8/10 before he received oxycodone but improved after that.  Niece at bedside.  Patient felt her pain is fairly controlled and ready to go home on current regimen.  Patient did not receive IV Dilaudid since yesterday evening.  Received OxyIR 5 mg x 3, OxyIR 10 mg x  2 in the last 18 hours.  Discharge Diagnoses:  Severe, intractable cancer related left chest wall pain Thoracic vertebra compression fracture with 30% height loss  -Tylenol 1 g 3 times daily -Oxycodone IR 5 mg twice daily -Oxycodone IR 10 mg daily for severe breakthrough pain -Lidoderm patch twice daily -Outpatient hospice and palliative medicine follow-up   Essential hypertension: Normotensive -Started on medications  Thrombocytopenia: Relatively stable.  Likely due to chemo CLL -Outpatient follow-up with oncology  Bilateral lower extremity swelling, left greater than right -Lower extremity Doppler negative for DVT -Swelling likely related to her cancer.  Albumin 3.3.  Elevated D-dimer, sinus tachycardia -CTA chest negative for PE.  LE Doppler negative for DVT  Discharge Instructions  Discharge Instructions    Call MD for:  difficulty breathing, headache or visual disturbances   Complete by: As directed    Call MD for:  severe uncontrolled pain   Complete by: As directed    Diet general   Complete by: As directed    Increase activity slowly   Complete by: As directed      Allergies as of 06/10/2019      Reactions   Codeine Nausea Only   Levofloxacin Rash      Medication List    STOP taking these medications   acyclovir 400 MG tablet Commonly known as: ZOVIRAX   nystatin cream Commonly known as: MYCOSTATIN   ondansetron 4 MG tablet Commonly known as: ZOFRAN   oxyCODONE-acetaminophen 10-325 MG tablet Commonly known as: PERCOCET   prochlorperazine 5 MG tablet Commonly known as: COMPAZINE   terconazole 0.4 % vaginal cream Commonly known as: TERAZOL 7   triamcinolone ointment 0.5 % Commonly known as: KENALOG     TAKE these medications   acetaminophen 500 MG tablet Commonly known as: TYLENOL Take 2 tablets (1,000 mg total) by mouth every 8 (eight) hours.   allopurinol 300 MG tablet Commonly known as: ZYLOPRIM Take 1 tablet (300 mg total) by  mouth daily.   amLODipine 5 MG tablet Commonly known as: NORVASC Take 1 tablet (5 mg total) by mouth daily.   augmented betamethasone dipropionate 0.05 % cream Commonly known as: DIPROLENE-AF Apply 1 application topically 2 (two) times daily as needed (flare ups).   lidocaine 5 % Commonly known as: Lidoderm Place 1 patch onto the skin every 12 (twelve) hours. Remove & Discard patch within 12 hours or as directed by MD   Oxycodone HCl 10 MG Tabs Take 1 tablet (10 mg total) by mouth 3 (three) times daily as needed for up to 7 days for severe pain or breakthrough pain.   oxyCODONE 5 MG immediate release tablet Commonly known as: Oxy IR/ROXICODONE Take 1 tablet (5 mg total) by mouth 3 (three) times daily for 7 days.   polyethylene glycol 17 g packet Commonly known as: MIRALAX / GLYCOLAX Take 17 g 1-3 times a day as needed for constipation What changed:   how much to take  how to take this  when to take this  reasons to take this  additional instructions   predniSONE 20 MG tablet Commonly known as:  DELTASONE Take 1 tablet (20 mg total) by mouth daily with breakfast. What changed: when to take this   senna-docusate 8.6-50 MG tablet Commonly known as: Senokot-S Take 1 tablet 1-2 times a day as needed for constipation What changed:   how much to take  how to take this  when to take this  reasons to take this  additional instructions       Consultations:  Oncology  Palliative care  Procedures/Studies:  2D Echo: None  Dg Chest 2 View  Result Date: 06/07/2019 CLINICAL DATA:  Chest pain EXAM: CHEST - 2 VIEW COMPARISON:  June 02, 2019 FINDINGS: The cardiomediastinal silhouette is unchanged from prior exam with mild cardiomegaly. There is opacity in the right upper lung which is likely due to overlap of shadows from positioning. The left lung is clear. No pleural effusion or focal airspace consolidation. Again noted is diffuse osteopenia and scoliotic  curvature. IMPRESSION: Opacity in the right upper lung which is likely thought to be due to overlap of shadows. If clinical concern remains would recommend repeat radiograph. Electronically Signed   By: Prudencio Pair M.D.   On: 06/07/2019 22:13   Ct Head Wo Contrast  Result Date: 06/03/2019 CLINICAL DATA:  Fall, headache EXAM: CT HEAD WITHOUT CONTRAST TECHNIQUE: Contiguous axial images were obtained from the base of the skull through the vertex without intravenous contrast. COMPARISON:  MRI 05/04/2013 FINDINGS: Brain: There is atrophy and chronic small vessel disease changes. No acute intracranial abnormality. Specifically, no hemorrhage, hydrocephalus, mass lesion, acute infarction, or significant intracranial injury. Vascular: No hyperdense vessel or unexpected calcification. Skull: No acute calvarial abnormality. Sinuses/Orbits: No acute finding Other: None IMPRESSION: Atrophy, chronic microvascular disease. No acute intracranial abnormality. Electronically Signed   By: Rolm Baptise M.D.   On: 06/03/2019 00:07   Ct Chest W Contrast  Result Date: 06/03/2019 CLINICAL DATA:  Fall from standing, history of CLL, flank pain and tenderness evaluate for splenic injury or retroperitoneal hematoma. Left rib pain. EXAM: CT CHEST, ABDOMEN, AND PELVIS WITH CONTRAST TECHNIQUE: Multidetector CT imaging of the chest, abdomen and pelvis was performed following the standard protocol during bolus administration of intravenous contrast. CONTRAST:  197mL OMNIPAQUE IOHEXOL 300 MG/ML  SOLN COMPARISON:  CT 04/22/2019 FINDINGS: CT CHEST FINDINGS Cardiovascular: The aortic root is suboptimally assessed given cardiac pulsation artifact. No acute luminal abnormality of the thoracic aorta. Minimal atheromatous plaque. No periaortic stranding or hemorrhage. Slightly tortuous course of the thoracic aorta. Normal 3 vessel branching of the arch mild tortuosity of the brachiocephalic vessels. Proximal great vessels are otherwise  unremarkable. Normal heart size. No pericardial effusion. Coronary artery calcifications are noted. Central pulmonary arteries are normal caliber without large central filling defects on this non tailored examination. Mediastinum/Nodes: Thyroid gland and thoracic inlet are unremarkable. No mediastinal hematoma or pneumomediastinum. No acute traumatic abnormality of the trachea or esophagus. No mediastinal, hilar or axillary adenopathy. Lungs/Pleura: Mild interlobular septal thickening is noted towards the lung apices and in the lung bases as well. Bandlike areas of opacity in the lower lobes and right middle lobe and lingula likely reflect areas of scarring and/or atelectasis. More dependent atelectasis is seen posteriorly. No effusion. No visible pneumothorax. Few stable sub 5 mm nodules are present in the right middle lobe (5/83, 5/65) and left lower lobe (5/87). Musculoskeletal: Inferior endplate compression deformity of the T8 vertebral body with approximately 30% height loss. Slightly increased anterior wedging compression deformity of the T11 vertebrae when compared to prior study from 04/22/2019 with  20% height loss anteriorly. Anterior wedging of the T12 vertebrae is unchanged. No other acute vertebral fracture or compression deformity is seen remote posttraumatic deformity of left mid clavicle. Additional remote posttraumatic deformities of the left ninth through eleventh ribs are noted. When compared to no acute displaced rib fractures. No other acute or suspicious osseous lesions. Severe dextrocurvature of the midthoracic spine with compensatory curvature of the lower thoracic spine and associated chest wall deformity. CT ABDOMEN PELVIS FINDINGS Hepatobiliary: No direct hepatic injury or perihepatic hematoma. Subcentimeter hypoattenuating focus along the anterior dome of the liver (4/40) too small to fully characterize on CT imaging but statistically likely benign. No concerning hepatic lesions.  Gallbladder is normal. No pericholecystic inflammation or fluid. There is mild intra and extrahepatic biliary ductal dilatation with a small calcified gallstone seen in the distal common bile duct just proximal to the ampulla of Vater (4/60-61). Pancreas: Mild pancreatic atrophy. No pancreatic ductal dilatation or surrounding inflammatory changes. Spleen: No direct splenic injury or perisplenic hematoma. No suspicious splenic lesions. Spleen at the upper limits of normal for size. Adrenals/Urinary Tract: No adrenal hemorrhage or suspicious adrenal lesions. No direct renal injury or perirenal hemorrhage. No extravasation of contrast is seen on excretory phase delayed imaging. Multiple fluid attenuation parapelvic left renal cysts are similar to comparison CT from September of 2020. Kidneys are otherwise unremarkable, without renal calculi, suspicious lesion, or hydronephrosis. Bladder is moderately distended but otherwise unremarkable. Stomach/Bowel: Distal esophagus and stomach are unremarkable. Air-filled 2 cm duodenal diverticulum (4/53), slightly more distended than on prior study but without adjacent inflammation. No small bowel dilatation or wall thickening. Cecum is displaced into the midline pelvis the appendix is not well visualized though no pericecal inflammation is seen. There is a moderate stool burden throughout the colon. No colonic dilatation or wall thickening however. Colonic anastomosis noted in the midline upper abdomen. Distal colonic diverticulosis without acute peridiverticular inflammation to suggest diverticulitis. Vascular/Lymphatic: Atherosclerotic plaque within the normal caliber aorta. No suspicious or enlarged lymph nodes in the included lymphatic chains. Reproductive: The prostate and seminal vesicles are unremarkable. Other: No free fluid or free air. No bowel containing hernias. No evidence of retroperitoneal or body wall hematoma. No significant soft tissue contusion though portions  of the lateral most flanks are partially collimated from view due to patient size. Musculoskeletal: Superior endplate compression deformity of L2 is unchanged from comparison CT 04/22/2019. Dextrocurvature of the lumbar spine centered at L3. Multilevel degenerative changes are present in the imaged portions of the spine. Features most pronounced at L1-2 and L5-S1. IMPRESSION: 1. New T8 compression deformity with approximately 30% height loss. Worsening anterior compression deformity of the T11 vertebrae now with 20% height loss anteriorly. Additional T12 and L2 compression deformities are stable from prior. 2. No other acute traumatic injury within the chest, abdomen, or pelvis. Specifically, no splenic injury or retroperitoneal hematoma. 3. Mild intra and extrahepatic biliary ductal dilatation with a small calcified gallstone seen in the distal common bile duct just proximal to the ampulla of Vater. Correlate with LFTs and consider further evaluation with MRCP as clinically indicated. 4. Stable sub 5 mm nodules in the right middle and left lower lobe. No further evaluation is required if patient is low risk however should continue with previously recommended 12 month follow-up in September of 2021 if patient is high risk. 5. Severe scoliotic curvature of the thoracolumbar spine with associated chest wall deformity. 6. Duodenal and colonic diverticula without features of diverticulitis. 7. Moderate bladder distention,  correlate for features of outlet obstruction or retention. 8. Aortic Atherosclerosis (ICD10-I70.0). Electronically Signed   By: Lovena Le M.D.   On: 06/03/2019 00:03   Ct Angio Chest Pe W Or Wo Contrast  Result Date: 06/08/2019 CLINICAL DATA:  Elevated D-dimer, pain, history CLL, hypertension EXAM: CT ANGIOGRAPHY CHEST WITH CONTRAST TECHNIQUE: Multidetector CT imaging of the chest was performed using the standard protocol during bolus administration of intravenous contrast. Multiplanar CT image  reconstructions and MIPs were obtained to evaluate the vascular anatomy. CONTRAST:  178mL OMNIPAQUE IOHEXOL 350 MG/ML SOLN IV COMPARISON:  CT chest 06/02/2019 FINDINGS: Cardiovascular: Atherosclerotic calcifications aorta, proximal great vessels, and minimally in coronary arteries. Aneurysmal dilatation of ascending thoracic aorta 4.1 cm transverse. No evidence of aortic dissection. Pulmonary arteries are suboptimally opacified as well as limited by respiratory motion. No definite large or central pulmonary emboli are visualized. Unable to exclude small and peripheral emboli by this study. No pericardial effusion. Enlargement of cardiac chambers. Mediastinum/Nodes: Esophagus unremarkable. Base of cervical region normal appearance. Lungs/Pleura: Bibasilar atelectasis in the lower lobes. Remaining lungs grossly clear. No pleural effusion or pneumothorax. Upper Abdomen: Spleen appears prominent in size but incompletely visualized. Remaining visualized upper abdomen unremarkable Musculoskeletal: Significant cervicothoracic scoliosis. Compression deformities of several thoracic vertebra are again seen. Review of the MIP images confirms the above findings. IMPRESSION: No gross evidence of large or central pulmonary emboli is identified on exam limited secondary to suboptimal pulmonary arterial opacification and respiratory motion. Bibasilar atelectasis. Osseous demineralization with compression fractures of several thoracic vertebra. Aneurysmal dilatation ascending thoracic aorta 4.1 cm transverse, recommendation below. Recommend annual imaging followup by CTA or MRA. This recommendation follows 2010 ACCF/AHA/AATS/ACR/ASA/SCA/SCAI/SIR/STS/SVM Guidelines for the Diagnosis and Management of Patients with Thoracic Aortic Disease. Circulation. 2010; [id number]. Aortic aneurysm NOS (ICD10-I71.9) Aortic Atherosclerosis (ICD10-I70.0). Aortic aneurysm NOS (ICD10-I71.9).  For Electronically Signed   By: Lavonia Dana M.D.   On:  06/08/2019 17:12   US Abdomen Complete  Result Date: 06/08/2019 CLINICAL DATA:  Abdominal distention. EXAM: ABDOMEN ULTRASOUND COMPLETE COMPARISON:  CT 06/02/2019. FINDINGS: Gallbladder: No gallstones or wall thickening visualized. No sonographic Murphy sign noted by sonographer. Common bile duct: Diameter: 8.5 mm. Intrahepatic biliary ductal dilatation also noted. Distal common bile duct stone suspected on prior CT. Again MRCP may prove useful for further evaluation. Liver: No focal lesion identified. Within normal limits in parenchymal echogenicity. Portal vein is patent on color Doppler imaging with normal direction of blood flow towards the liver. IVC: No abnormality visualized. Pancreas: Visualized portion unremarkable. Spleen: Size and appearance within normal limits. Right Kidney: Length: 8.6 cm. Echogenicity within normal limits. No mass or hydronephrosis visualized. Left Kidney: Length: 9.5 cm. Echogenicity within normal limits. No mass or hydronephrosis visualized. Abdominal aorta: No aneurysm visualized. Other findings: No ascites. IMPRESSION: 1. Common bile duct is dilated 8.5 mm. Intrahepatic biliary ductal dilatation also noted. Distal common bile duct stone suspected on prior CT of 06/02/2019. Again MRCP may prove useful for further evaluation. 2.  No other abnormality identified.  No ascites. Electronically Signed   By: Marcello Moores  Register   On: 06/08/2019 07:05   Ct Abdomen Pelvis W Contrast  Result Date: 06/03/2019 CLINICAL DATA:  Fall from standing, history of CLL, flank pain and tenderness evaluate for splenic injury or retroperitoneal hematoma. Left rib pain. EXAM: CT CHEST, ABDOMEN, AND PELVIS WITH CONTRAST TECHNIQUE: Multidetector CT imaging of the chest, abdomen and pelvis was performed following the standard protocol during bolus administration of intravenous contrast. CONTRAST:  119mL  OMNIPAQUE IOHEXOL 300 MG/ML  SOLN COMPARISON:  CT 04/22/2019 FINDINGS: CT CHEST FINDINGS  Cardiovascular: The aortic root is suboptimally assessed given cardiac pulsation artifact. No acute luminal abnormality of the thoracic aorta. Minimal atheromatous plaque. No periaortic stranding or hemorrhage. Slightly tortuous course of the thoracic aorta. Normal 3 vessel branching of the arch mild tortuosity of the brachiocephalic vessels. Proximal great vessels are otherwise unremarkable. Normal heart size. No pericardial effusion. Coronary artery calcifications are noted. Central pulmonary arteries are normal caliber without large central filling defects on this non tailored examination. Mediastinum/Nodes: Thyroid gland and thoracic inlet are unremarkable. No mediastinal hematoma or pneumomediastinum. No acute traumatic abnormality of the trachea or esophagus. No mediastinal, hilar or axillary adenopathy. Lungs/Pleura: Mild interlobular septal thickening is noted towards the lung apices and in the lung bases as well. Bandlike areas of opacity in the lower lobes and right middle lobe and lingula likely reflect areas of scarring and/or atelectasis. More dependent atelectasis is seen posteriorly. No effusion. No visible pneumothorax. Few stable sub 5 mm nodules are present in the right middle lobe (5/83, 5/65) and left lower lobe (5/87). Musculoskeletal: Inferior endplate compression deformity of the T8 vertebral body with approximately 30% height loss. Slightly increased anterior wedging compression deformity of the T11 vertebrae when compared to prior study from 04/22/2019 with 20% height loss anteriorly. Anterior wedging of the T12 vertebrae is unchanged. No other acute vertebral fracture or compression deformity is seen remote posttraumatic deformity of left mid clavicle. Additional remote posttraumatic deformities of the left ninth through eleventh ribs are noted. When compared to no acute displaced rib fractures. No other acute or suspicious osseous lesions. Severe dextrocurvature of the midthoracic spine  with compensatory curvature of the lower thoracic spine and associated chest wall deformity. CT ABDOMEN PELVIS FINDINGS Hepatobiliary: No direct hepatic injury or perihepatic hematoma. Subcentimeter hypoattenuating focus along the anterior dome of the liver (4/40) too small to fully characterize on CT imaging but statistically likely benign. No concerning hepatic lesions. Gallbladder is normal. No pericholecystic inflammation or fluid. There is mild intra and extrahepatic biliary ductal dilatation with a small calcified gallstone seen in the distal common bile duct just proximal to the ampulla of Vater (4/60-61). Pancreas: Mild pancreatic atrophy. No pancreatic ductal dilatation or surrounding inflammatory changes. Spleen: No direct splenic injury or perisplenic hematoma. No suspicious splenic lesions. Spleen at the upper limits of normal for size. Adrenals/Urinary Tract: No adrenal hemorrhage or suspicious adrenal lesions. No direct renal injury or perirenal hemorrhage. No extravasation of contrast is seen on excretory phase delayed imaging. Multiple fluid attenuation parapelvic left renal cysts are similar to comparison CT from September of 2020. Kidneys are otherwise unremarkable, without renal calculi, suspicious lesion, or hydronephrosis. Bladder is moderately distended but otherwise unremarkable. Stomach/Bowel: Distal esophagus and stomach are unremarkable. Air-filled 2 cm duodenal diverticulum (4/53), slightly more distended than on prior study but without adjacent inflammation. No small bowel dilatation or wall thickening. Cecum is displaced into the midline pelvis the appendix is not well visualized though no pericecal inflammation is seen. There is a moderate stool burden throughout the colon. No colonic dilatation or wall thickening however. Colonic anastomosis noted in the midline upper abdomen. Distal colonic diverticulosis without acute peridiverticular inflammation to suggest diverticulitis.  Vascular/Lymphatic: Atherosclerotic plaque within the normal caliber aorta. No suspicious or enlarged lymph nodes in the included lymphatic chains. Reproductive: The prostate and seminal vesicles are unremarkable. Other: No free fluid or free air. No bowel containing hernias. No evidence of retroperitoneal  or body wall hematoma. No significant soft tissue contusion though portions of the lateral most flanks are partially collimated from view due to patient size. Musculoskeletal: Superior endplate compression deformity of L2 is unchanged from comparison CT 04/22/2019. Dextrocurvature of the lumbar spine centered at L3. Multilevel degenerative changes are present in the imaged portions of the spine. Features most pronounced at L1-2 and L5-S1. IMPRESSION: 1. New T8 compression deformity with approximately 30% height loss. Worsening anterior compression deformity of the T11 vertebrae now with 20% height loss anteriorly. Additional T12 and L2 compression deformities are stable from prior. 2. No other acute traumatic injury within the chest, abdomen, or pelvis. Specifically, no splenic injury or retroperitoneal hematoma. 3. Mild intra and extrahepatic biliary ductal dilatation with a small calcified gallstone seen in the distal common bile duct just proximal to the ampulla of Vater. Correlate with LFTs and consider further evaluation with MRCP as clinically indicated. 4. Stable sub 5 mm nodules in the right middle and left lower lobe. No further evaluation is required if patient is low risk however should continue with previously recommended 12 month follow-up in September of 2021 if patient is high risk. 5. Severe scoliotic curvature of the thoracolumbar spine with associated chest wall deformity. 6. Duodenal and colonic diverticula without features of diverticulitis. 7. Moderate bladder distention, correlate for features of outlet obstruction or retention. 8. Aortic Atherosclerosis (ICD10-I70.0). Electronically Signed    By: Lovena Le M.D.   On: 06/03/2019 00:03   Mr 3d Recon At Scanner  Result Date: 06/09/2019 CLINICAL DATA:  Abnormal liver function tests with cholelithiasis. EXAM: MRI ABDOMEN WITHOUT AND WITH CONTRAST (INCLUDING MRCP) TECHNIQUE: Multiplanar multisequence MR imaging of the abdomen was performed both before and after the administration of intravenous contrast. Heavily T2-weighted images of the biliary and pancreatic ducts were obtained, and three-dimensional MRCP images were rendered by post processing. CONTRAST:  20mL GADAVIST GADOBUTROL 1 MMOL/ML IV SOLN COMPARISON:  CT abdomen/pelvis 06/02/2019. FINDINGS: Lower chest: The heart is enlarged. Hepatobiliary: 7 mm simple cyst is identified in the anterior liver no suspicious enhancing liver abnormality. Liver measures 16.2 cm craniocaudal length, upper normal. Gallbladder is distended no evidence for gallstones by MRI. Mild intrahepatic biliary duct prominence. Common bile duct in the head of the pancreas measures 7 mm diameter which is upper normal for patient age. The common bile duct shows rather abrupt transition at the ampulla, but no definite evidence for choledocholithiasis. Pancreas: No focal mass lesion. Main pancreatic duct measures 3 mm diameter, upper normal. No intraparenchymal cyst. No peripancreatic edema. Spleen: Spleen is 12.4 cm craniocaudal length. No focal abnormality. Adrenals/Urinary Tract: No adrenal nodule or mass. No suspicious enhancing abnormality in either kidney. Tiny cortical T2 hyperintensities in the cortex of the kidneys without definite enhancement suggest tiny cortical cyst. Central sinus cysts are noted in the kidneys bilaterally, left greater than right. Stomach/Bowel: Stomach is unremarkable. No gastric wall thickening. No evidence of outlet obstruction. Duodenum is normally positioned as is the ligament of Treitz. No small bowel or colonic dilatation within the visualized abdomen. Vascular/Lymphatic: No abdominal aortic  aneurysm. No abdominal lymphadenopathy Other: Fluid-filled structure in the central pelvis is been incompletely visualized but is presumably a markedly dilated urinary bladder. Musculoskeletal: No abnormal marrow enhancement within the visualized bony anatomy IMPRESSION: 1. Mild intra and extrahepatic biliary duct prominence with common bile duct measuring upper normal for patient age. No gallstones evident and no definite choledocholithiasis. Somewhat abrupt transition of the duct into the ampulla noted, but nonspecific.  ERCP could be used to further evaluate for ampullary lesion as clinically warranted. 2. Hepatic and renal cysts. 3. Presumed marked distention of the urinary bladder. Electronically Signed   By: Misty Stanley M.D.   On: 06/09/2019 07:58   Dg Humerus Right  Result Date: 06/02/2019 CLINICAL DATA:  Fall, right arm pain EXAM: RIGHT HUMERUS - 2+ VIEW COMPARISON:  None. FINDINGS: There is no evidence of fracture or other focal bone lesions. Soft tissues are unremarkable. IMPRESSION: Negative. Electronically Signed   By: Rolm Baptise M.D.   On: 06/02/2019 22:07   Mr Abdomen Mrcp Moise Boring Contast  Result Date: 06/09/2019 CLINICAL DATA:  Abnormal liver function tests with cholelithiasis. EXAM: MRI ABDOMEN WITHOUT AND WITH CONTRAST (INCLUDING MRCP) TECHNIQUE: Multiplanar multisequence MR imaging of the abdomen was performed both before and after the administration of intravenous contrast. Heavily T2-weighted images of the biliary and pancreatic ducts were obtained, and three-dimensional MRCP images were rendered by post processing. CONTRAST:  63mL GADAVIST GADOBUTROL 1 MMOL/ML IV SOLN COMPARISON:  CT abdomen/pelvis 06/02/2019. FINDINGS: Lower chest: The heart is enlarged. Hepatobiliary: 7 mm simple cyst is identified in the anterior liver no suspicious enhancing liver abnormality. Liver measures 16.2 cm craniocaudal length, upper normal. Gallbladder is distended no evidence for gallstones by MRI. Mild  intrahepatic biliary duct prominence. Common bile duct in the head of the pancreas measures 7 mm diameter which is upper normal for patient age. The common bile duct shows rather abrupt transition at the ampulla, but no definite evidence for choledocholithiasis. Pancreas: No focal mass lesion. Main pancreatic duct measures 3 mm diameter, upper normal. No intraparenchymal cyst. No peripancreatic edema. Spleen: Spleen is 12.4 cm craniocaudal length. No focal abnormality. Adrenals/Urinary Tract: No adrenal nodule or mass. No suspicious enhancing abnormality in either kidney. Tiny cortical T2 hyperintensities in the cortex of the kidneys without definite enhancement suggest tiny cortical cyst. Central sinus cysts are noted in the kidneys bilaterally, left greater than right. Stomach/Bowel: Stomach is unremarkable. No gastric wall thickening. No evidence of outlet obstruction. Duodenum is normally positioned as is the ligament of Treitz. No small bowel or colonic dilatation within the visualized abdomen. Vascular/Lymphatic: No abdominal aortic aneurysm. No abdominal lymphadenopathy Other: Fluid-filled structure in the central pelvis is been incompletely visualized but is presumably a markedly dilated urinary bladder. Musculoskeletal: No abnormal marrow enhancement within the visualized bony anatomy IMPRESSION: 1. Mild intra and extrahepatic biliary duct prominence with common bile duct measuring upper normal for patient age. No gallstones evident and no definite choledocholithiasis. Somewhat abrupt transition of the duct into the ampulla noted, but nonspecific. ERCP could be used to further evaluate for ampullary lesion as clinically warranted. 2. Hepatic and renal cysts. 3. Presumed marked distention of the urinary bladder. Electronically Signed   By: Misty Stanley M.D.   On: 06/09/2019 07:58   Vas [place] Lower Extremity Venous (dvt)  Result Date: 06/09/2019  Lower Venous Study Indications: Swelling.  Comparison Study:  no prior Performing Technologist: Abram Sander RVS  Examination Guidelines: A complete evaluation includes B-mode imaging, spectral Doppler, color Doppler, and power Doppler as needed of all accessible portions of each vessel. Bilateral testing is considered an integral part of a complete examination. Limited examinations for reoccurring indications may be performed as noted.  +---------+---------------+---------+-----------+----------+--------------+  RIGHT     Compressibility Phasicity Spontaneity Properties Thrombus Aging  +---------+---------------+---------+-----------+----------+--------------+  CFV       Full            Yes  Yes                                    +---------+---------------+---------+-----------+----------+--------------+  SFJ       Full                                                             +---------+---------------+---------+-----------+----------+--------------+  FV Prox   Full                                                             +---------+---------------+---------+-----------+----------+--------------+  FV Mid    Full                                                             +---------+---------------+---------+-----------+----------+--------------+  FV Distal Full                                                             +---------+---------------+---------+-----------+----------+--------------+  PFV       Full                                                             +---------+---------------+---------+-----------+----------+--------------+  POP       Full            Yes       Yes                                    +---------+---------------+---------+-----------+----------+--------------+  PTV       Full                                                             +---------+---------------+---------+-----------+----------+--------------+  PERO      Full                                                              +---------+---------------+---------+-----------+----------+--------------+   +---------+---------------+---------+-----------+----------+--------------+  LEFT      Compressibility Phasicity Spontaneity Properties Thrombus Aging  +---------+---------------+---------+-----------+----------+--------------+  CFV       Full            Yes       Yes                                    +---------+---------------+---------+-----------+----------+--------------+  SFJ       Full                                                             +---------+---------------+---------+-----------+----------+--------------+  FV Prox   Full                                                             +---------+---------------+---------+-----------+----------+--------------+  FV Mid    Full                                                             +---------+---------------+---------+-----------+----------+--------------+  FV Distal Full                                                             +---------+---------------+---------+-----------+----------+--------------+  PFV       Full                                                             +---------+---------------+---------+-----------+----------+--------------+  POP       Full            Yes       Yes                                    +---------+---------------+---------+-----------+----------+--------------+  PTV       Full                                                             +---------+---------------+---------+-----------+----------+--------------+  PERO      Full                                                             +---------+---------------+---------+-----------+----------+--------------+  Summary: Right: There is no evidence of deep vein thrombosis in the lower extremity. No cystic structure found in the popliteal fossa. Left: There is no evidence of deep vein thrombosis in the lower extremity. No cystic structure found in the popliteal fossa.  *See  table(s) above for measurements and observations. Electronically signed by Harold Barban MD on 06/09/2019 at 5:51:57 PM.    Final       Discharge Exam: Vitals:   06/10/19 0501 06/10/19 1544  BP: (!) 141/82 132/79  Pulse: 93 (!) 111  Resp: 17 18  Temp: 98.2 F (36.8 C) 98.2 F (36.8 C)  SpO2: 96% 96%    GENERAL: No acute distress.  Appears well.  HEENT: MMM.  Vision and hearing grossly intact.  NECK: Supple.  No apparent JVD.  RESP:  No IWOB. Good air movement bilaterally. CVS:  RRR. Heart sounds normal.  ABD/GI/GU: Bowel sounds present. Soft. Non tender.  MSK/EXT:  Moves extremities. No apparent deformity or edema.  SKIN: no apparent skin lesion or wound NEURO: Awake, alert and oriented appropriately.  No gross deficit.  PSYCH: Calm. Normal affect.   The results of significant diagnostics from this hospitalization (including imaging, microbiology, ancillary and laboratory) are listed below for reference.     Microbiology: Recent Results (from the past 240 hour(s))  SARS CORONAVIRUS 2 (TAT 6-24 HRS) Nasopharyngeal Nasopharyngeal Swab     Status: None   Collection Time: 06/07/19 11:14 PM   Specimen: Nasopharyngeal Swab  Result Value Ref Range Status   SARS Coronavirus 2 NEGATIVE NEGATIVE Final    Comment: (NOTE) SARS-CoV-2 target nucleic acids are NOT DETECTED. The SARS-CoV-2 RNA is generally detectable in upper and lower respiratory specimens during the acute phase of infection. Negative results do not preclude SARS-CoV-2 infection, do not rule out co-infections with other pathogens, and should not be used as the sole basis for treatment or other patient management decisions. Negative results must be combined with clinical observations, patient history, and epidemiological information. The expected result is Negative. Fact Sheet for Patients: SugarRoll.be Fact Sheet for Healthcare  Providers: https://www.woods-mathews.com/ This test is not yet approved or cleared by the [place] FDA and  has been authorized for detection and/or diagnosis of SARS-CoV-2 by FDA under an Emergency Use Authorization (EUA). This EUA will remain  in effect (meaning this test can be used) for the duration of the COVID-19 declaration under Section 56 4(b)(1) of the Act, 21 U.S.C. section 360bbb-3(b)(1), unless the authorization is terminated or revoked sooner. Performed at [facility], [place] [address], [place], [place] [address]      Labs: BNP (last 3 results) No results for input(s): BNP in the last 8760 hours. Basic Metabolic Panel: Recent Labs  Lab 06/07/19 2130 06/08/19 0526 06/09/19 0454 06/10/19 0532  NA 134* 136 136 136  K 4.1 4.1 4.5 4.1  CL 100 100 100 102  CO2 24 27 26 27   GLUCOSE 129* 103* 178* 107*  BUN 12 11 15 14   CREATININE 0.71 0.79 0.73 0.74  CALCIUM 9.0 8.8* 9.2 9.2   Liver Function Tests: Recent Labs  Lab 06/08/19 0526  AST 20  ALT 26  ALKPHOS 93  BILITOT 1.7*  PROT 5.8*  ALBUMIN 3.3*   No results for input(s): LIPASE, AMYLASE in the last 168 hours. No results for input(s): AMMONIA in the last 168 hours. CBC: Recent Labs  Lab 06/07/19 2130 06/08/19 0526 06/09/19 0454 06/10/19 0532  WBC 16.2* 11.9* 14.7* 9.0  HGB 11.8* 10.3*  11.1* 10.7*  HCT 36.5 32.7* 34.4* 32.5*  MCV 109.0* 109.0* 106.8* 108.3*  PLT 123* 115* 139* 122*   Cardiac Enzymes: No results for input(s): CKTOTAL, CKMB, CKMBINDEX, TROPONINI in the last 168 hours. BNP: Invalid input(s): POCBNP CBG: No results for input(s): GLUCAP in the last 168 hours. D-Dimer Recent Labs    06/08/19 0526  DDIMER 0.95*   Hgb A1c No results for input(s): HGBA1C in the last 72 hours. Lipid Profile No results for input(s): CHOL, HDL, LDLCALC, TRIG, CHOLHDL, LDLDIRECT in the last 72 hours. Thyroid function studies No results for input(s): TSH, T4TOTAL, T3FREE,  THYROIDAB in the last 72 hours.  Invalid input(s): FREET3 Anemia work up No results for input(s): VITAMINB12, FOLATE, FERRITIN, TIBC, IRON, RETICCTPCT in the last 72 hours. Urinalysis    Component Value Date/Time   COLORURINE DARK YELLOW 05/19/2019 1112   APPEARANCEUR CLEAR 05/19/2019 1112   LABSPEC 1.020 05/19/2019 1112   PHURINE 6.0 05/19/2019 1112   GLUCOSEU NEGATIVE 05/19/2019 1112   HGBUR 3+ (A) 05/19/2019 1112   BILIRUBINUR n 01/23/2016 0927   KETONESUR NEGATIVE 05/19/2019 1112   PROTEINUR NEGATIVE 05/19/2019 1112   UROBILINOGEN 0.2 01/23/2016 0927   UROBILINOGEN 0.2 03/30/2014 1203   NITRITE n 01/23/2016 0927   NITRITE NEGATIVE 04/01/2015 1347   LEUKOCYTESUR Negative 01/23/2016 0927   Sepsis Labs Invalid input(s): PROCALCITONIN,  WBC,  LACTICIDVEN   Time coordinating discharge: 35 minutes  SIGNED:  Mercy Riding, MD  [facility] 06/10/2019, 7:33 PM  If 7PM-7AM, please contact night-coverage [email or web address] Password [email or web address]

## 2019-06-11 ENCOUNTER — Telehealth: Payer: Self-pay

## 2019-06-11 NOTE — Telephone Encounter (Signed)
Palliative Medicine RN Note: Rec'd request from PMT NP Elmo Putt to confirm that Manufacturing engineer [facility] Palliative Team is aware of referral and is checking on Ms Provan sooner rather than later.   Note that their liaison left a note in the chart the day before d/c. I called the office and left a message with the palliative intake team letting them know about concerns regarding Ms Riggenbach's pain control.  Marjie Skiff Yuuki Skeens, RN, BSN, [facility] Palliative Medicine Team 06/11/2019 12:30 PM Office [phone]

## 2019-06-11 NOTE — Telephone Encounter (Signed)
Received message from [facility], [facility] [place] [facility] Coordinator, that patient would benefit from an in home visit ASAP. Reached out to Dr. Calton Dach nurse to confirm that she is in agreement with Palliative Care. Patient is scheduled for Monday 06/15/2019 pending Dr. Alvy Bimler being in agreement

## 2019-06-12 ENCOUNTER — Telehealth: Payer: Self-pay | Admitting: Internal Medicine

## 2019-06-12 ENCOUNTER — Telehealth: Payer: Self-pay

## 2019-06-12 ENCOUNTER — Telehealth: Payer: Self-pay | Admitting: Hematology and Oncology

## 2019-06-12 ENCOUNTER — Telehealth: Payer: Self-pay | Admitting: *Deleted

## 2019-06-12 NOTE — Telephone Encounter (Signed)
Ok to order 

## 2019-06-12 NOTE — Telephone Encounter (Signed)
Received message from Dr. Calton Dach office that referral will need to go through PCP. Message sent via Epic to PCP to ensure it is ok for Palliative to follow patient in the home.

## 2019-06-12 NOTE — Telephone Encounter (Signed)
Verbal orders given to Stacey. 

## 2019-06-12 NOTE — Telephone Encounter (Signed)
Home Health Verbal Orders - Caller/Agency: [facility] Number: [phone] option 2 Requesting OT/PT/Skilled Nursing/Social Work/Speech Therapy: Palliative care (frequency determined after initial visit)  Frequency:

## 2019-06-12 NOTE — Telephone Encounter (Signed)
Transition Care Management Follow-up Telephone Call   Date discharged? 11.11.2020    How have you been since you were released from the hospital? "I'm not so good"   Do you understand why you were in the hospital? yes   Do you understand the discharge instructions? yes   Where were you discharged to?  Home    Items Reviewed:  Medications reviewed: yes  Allergies reviewed: yes  Dietary changes reviewed: N/A   Referrals reviewed: yes   Functional Questionnaire:   Activities of Daily Living (ADLs):   She states they are independent in the following: ambulation, bathing and hygiene, feeding, continence, grooming, toileting and dressing States they require assistance with the following: N/A    Any transportation issues/concerns?: no   Any patient concerns?  Ankles are swelling and she is currently having diarrhea    Confirmed importance and date/time of follow-up visits scheduled yes  Provider Appointment booked with  Dr.Hernandez 06/16/2019  Confirmed with patient if condition begins to worsen call PCP or go to the ER.  Patient was given the office number and encouraged to call back with question or concerns.  : yes

## 2019-06-12 NOTE — Telephone Encounter (Signed)
Called pt per 11/13 sch messae - unable to reach pt . Left message with appt date and time

## 2019-06-14 NOTE — Progress Notes (Signed)
Nov 16th, 2020 [facility] Palliative Care Consult Note Telephone: [phone]  Fax: [phone]  PATIENT NAME: April [name] DOB: 04/08/1935 MRN: [id number] [place] [id number] [email or web address]   PRIMARY CARE PROVIDER:   Isaac Bliss, Rayford Halsted, MD [phone] Dr. Heath Lark (Hematology and Oncology [phone]) [facility] [phone])  REFERRING PROVIDER:  Isaac Bliss, Rayford Halsted, MD [place] [address] RESPONSIBLE PARTY:   Lorayne Marek (niece/HCPOA), [phone]    ASSESSMENT / RECOMMENDATIONS:  1.Advance Care Planning:  A. Directives: Discussed wishes in the event of a Cardio Pulmonary Arrest. Patient desires DNR status. I completed two forms, and uploaded them into [facility] EMR/VYNCA. Advised patient to place one form on the fridge (where EMS would look for it) and bring the other copy with her when she travels outside of the home.   B. Goals of Care: Control symptoms of pain. Per Dr. Calton Dach previous note, it was discussed that her chemotherapy is palliative, not curative. I did not address this, on this particular visit, but will on future encounters.   2. Symptom Management:  A. Pain: Moderately severe to severe, center lower back and bilateral anterior lower rib cage. Described as sensation of being "wrapped up with wire". Exacerbated with movement. Awakened this am at 5am by severe pain. Medicated with Oxycodone 5mg , repeated 4 hours later. She resists taking her 10mg  oxycodone as she worries ti's too strong for her. On prednisone to assist management of pain from splenomegaly. Lidoderm patch.  - Oxycodone 5 mg tab (or  of a 10 mg tab), on a scheduled basis, five times a day (6am, 10am, 2pm, 6pm, 10pm). -Take acetaminophen 325mg  with each oxycodone, or 500mg  tab at 6am, 2pm, and 10pm.  B. Opioid induced constipation managed with prn Miralax, with mixed results.  -Start Senokot 1 tab twice  a day. Can increase by 1 tab a day, up to 6 daily.  3. Cognitive / Functional status: Patient is A & O. Able to self-transfer and ambulate independently with and without rolling walker. Can travel up and down flight of stairs to upstairs, but can be more difficult as the day progresses. Her bed is downstairs, but the step in shower is upstairs. She has a bathtub / shower downstairs. Usually good appetite of 3 light meals a day. Her weight is stable at about 100lbs. At a height of 5' her BMI is 19.5 kg/m2. They used to eat out at restaurant frequently, and miss this.  -discussed her getting a transfer bench. Her husband believes they may have one available.  -discussed patient obtaining a call button so that she can summon her husband during the night (he sleeps upstairs) if needed. They have the [employer] site to order this.   -discussed order out for delivery, day before for next day's meal, from their favorite restaurants.  4. Family / Community Supports: PT by [facility] [phone]). Married 60 yrs. No children. They are close to their niece Lorayne Marek who is also their HCPOA [facility]); she visits frequently.  -TC to [facility] Ailene Ravel) who will reach out to PCP for order for HHA  5. Follow up Palliative Care Visit: Thursday December 17TH at [place]:  Past Medical History:  Diagnosis Date  . Breast CA ([place])   . Bronchitis, chronic ([place])   . Eye hemorrhage, left   . 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 Osteoporosis 05/2018   T score -3.1 overall stable from prior study    SOCIAL HX:  Social History   Tobacco Use  . Smoking status: Former Smoker    Types: Cigarettes    Quit date: 03/26/1955    Years since quitting: 64.2  . Smokeless tobacco: Never Used  Substance Use Topics  . Alcohol use: Yes    Alcohol/week: 5.0 standard drinks    Types: 5 Standard drinks or equivalent per week     Comment: wine    ALLERGIES:  Allergies  Allergen Reactions  . Codeine Nausea Only  . Levofloxacin Rash     PERTINENT MEDICATIONS:  Outpatient Encounter Medications as of 06/15/2019  Medication Sig  . acetaminophen (TYLENOL) 500 MG tablet Take 2 tablets (1,000 mg total) by mouth every 8 (eight) hours.  Marland Kitchen allopurinol (ZYLOPRIM) 300 MG tablet Take 1 tablet (300 mg total) by mouth daily.  Marland Kitchen amLODipine (NORVASC) 5 MG tablet Take 1 tablet (5 mg total) by mouth daily.  Marland Kitchen augmented betamethasone dipropionate (DIPROLENE-AF) 0.05 % cream Apply 1 application topically 2 (two) times daily as needed (flare ups).   . lidocaine (LIDODERM) 5 % Place 1 patch onto the skin every 12 (twelve) hours. Remove & Discard patch within 12 hours or as directed by MD  . oxyCODONE (OXY IR/ROXICODONE) 5 MG immediate release tablet Take 1 tablet (5 mg total) by mouth 3 (three) times daily for 7 days.  Marland Kitchen oxyCODONE 10 MG TABS Take 1 tablet (10 mg total) by mouth 3 (three) times daily as needed for up to 7 days for severe pain or breakthrough pain.  . polyethylene glycol (MIRALAX / GLYCOLAX) 17 g packet Take 17 g 1-3 times a day as needed for constipation  . predniSONE (DELTASONE) 20 MG tablet Take 1 tablet (20 mg total) by mouth daily with breakfast. (Patient taking differently: Take 20 mg by mouth 2 (two) times daily with a meal. )  . senna-docusate (SENOKOT-S) 8.6-50 MG tablet Take 1 tablet 1-2 times a day as needed for constipation   No facility-administered encounter medications on file as of 06/15/2019.     PHYSICAL EXAM:   Frail appearing, thin, pleasantly conversant. Her husband is in attendance. Abbreviated PE to decrease risk of COVID exposure. Extremities: no edema, no joint deformities Skin: no rashes exposed skin Neurological: Weakness but otherwise non-focal  Julianne Handler, NP

## 2019-06-15 ENCOUNTER — Encounter: Payer: Self-pay | Admitting: Internal Medicine

## 2019-06-15 ENCOUNTER — Other Ambulatory Visit: Payer: Medicare Other | Admitting: Internal Medicine

## 2019-06-15 ENCOUNTER — Other Ambulatory Visit: Payer: Self-pay

## 2019-06-15 DIAGNOSIS — Z515 Encounter for palliative care: Secondary | ICD-10-CM

## 2019-06-15 DIAGNOSIS — R52 Pain, unspecified: Secondary | ICD-10-CM

## 2019-06-16 ENCOUNTER — Other Ambulatory Visit: Payer: Self-pay

## 2019-06-16 ENCOUNTER — Telehealth: Payer: Self-pay

## 2019-06-16 ENCOUNTER — Other Ambulatory Visit: Payer: Self-pay | Admitting: Hematology and Oncology

## 2019-06-16 ENCOUNTER — Encounter: Payer: Self-pay | Admitting: Internal Medicine

## 2019-06-16 ENCOUNTER — Ambulatory Visit: Payer: Medicare Other | Admitting: Internal Medicine

## 2019-06-16 VITALS — BP 124/70 | HR 100 | Temp 97.3°F | Wt 97.0 lb

## 2019-06-16 DIAGNOSIS — S22060A Wedge compression fracture of T7-T8 vertebra, initial encounter for closed fracture: Secondary | ICD-10-CM

## 2019-06-16 DIAGNOSIS — R52 Pain, unspecified: Secondary | ICD-10-CM

## 2019-06-16 DIAGNOSIS — C9110 Chronic lymphocytic leukemia of B-cell type not having achieved remission: Secondary | ICD-10-CM

## 2019-06-16 DIAGNOSIS — M549 Dorsalgia, unspecified: Secondary | ICD-10-CM | POA: Diagnosis not present

## 2019-06-16 DIAGNOSIS — T402X5A Adverse effect of other opioids, initial encounter: Secondary | ICD-10-CM

## 2019-06-16 DIAGNOSIS — K5903 Drug induced constipation: Secondary | ICD-10-CM

## 2019-06-16 DIAGNOSIS — Z09 Encounter for follow-up examination after completed treatment for conditions other than malignant neoplasm: Secondary | ICD-10-CM

## 2019-06-16 MED ORDER — OXYCODONE HCL 5 MG PO TABS: 5.0000 mg | ORAL_TABLET | ORAL | 0 refills | Status: DC | PRN

## 2019-06-16 NOTE — Progress Notes (Signed)
Hospital follow-up visit     CC/Reason for Visit: Hospitalization follow-up  HPI: April [name] is a 84 y.o. female who is coming in today for the above mentioned reasons, specifically transitional care services face-to-face visit.    Dates hospitalized: 06/07/2019-06/10/2019 Days since discharge from hospital: 6 Patient was discharged from the hospital to: Home Reason for admission to hospital: Intractable back pain Date of interactive phone contact with patient and/or caregiver: 06/12/2019  I have reviewed in detail patient's discharge summary plus pertinent specific notes, labs, and images from the hospitalization.    Patient is currently undergoing treatment for her CLL.  She fell and developed a T8 compression fracture and has been having significant pain.  Her oncologist has been treating her outpatient, however due to severe intractable pain and difficulty ambulating she was admitted to the hospital.  She was evaluated by palliative care services and started on a pain care regimen.  Since hospital discharge she has been visited by palliative care services at home who have been further adjusted her pain regimen to include Tylenol 500 mg twice daily, oxycodone 5 mg 5 times a day with 10 mg of oxycodone to take as needed for breakthrough pain.  She has also been seen by palliative care.  She is having trouble getting out of a chair and is wondering whether a lift chair might be beneficial.  She has not had a bowel movement in 2 days.  Since discharge her pain has somewhat improved although she states that "today is a bad day".  Medication reconciliation was done today and patient is taking meds as recommended by discharging hospitalist/specialist. yes   Past Medical/Surgical History: Past Medical History:  Diagnosis Date  . Breast CA ([place])   . Bronchitis, chronic ([place])   . Eye hemorrhage, left   . Leukemia, chronic lymphoid ([place])   . 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 Osteoporosis 05/2018   T score -3.1 overall stable from prior study    Past Surgical History:  Procedure Laterality Date  . ABDOMINAL HYSTERECTOMY  1980  . BREAST IMPLANTS REMOVED  2001  . BREAST SURGERY     Bilateral mastectomy  . MASTECTOMY  1982   BILATERAL  . OOPHORECTOMY     BSO  . PARTIAL COLECTOMY     INTESTINAL ABSCESS WITH SALPINGECTOMY  . RECONSTRUCTION LEFT ELBOW    . TONSILLECTOMY    . UMBILLICAL HERNIA REPAIR      Social History:  reports that she quit smoking about 64 years ago. Her smoking use included cigarettes. She has never used smokeless tobacco. She reports current alcohol use of about 5.0 standard drinks of alcohol per week. She reports that she does not use drugs.  Allergies: Allergies  Allergen Reactions  . Codeine Nausea Only  . Levofloxacin Rash    Family History:  Family History  Problem Relation Age of Onset  . Cancer Brother        HODGKINS  . Cancer Brother        leukemia  . Parkinson's disease Brother   . Heart disease Mother   . Heart disease Sister      Current Outpatient Medications:  .  acetaminophen (TYLENOL) 500 MG tablet, Take 2 tablets (1,000 mg total) by mouth every 8 (eight) hours., Disp: 180 tablet, Rfl: 11 .  allopurinol (ZYLOPRIM) 300 MG tablet, Take 1 tablet (300 mg total) by mouth daily., Disp: 30 tablet, Rfl: 3 .  amLODipine (NORVASC) 5 MG tablet, Take 1 tablet (5 mg total) by mouth daily., Disp: 30 tablet, Rfl: 0 .  augmented betamethasone dipropionate (DIPROLENE-AF) 0.05 % cream, Apply 1 application topically 2 (two) times daily as needed (flare ups). , Disp: , Rfl: 2 .  lidocaine (LIDODERM) 5 %, Place 1 patch onto the skin every 12 (twelve) hours. Remove & Discard patch within 12 hours or as directed by MD, Disp: 60 patch, Rfl: 0 .  oxyCODONE (OXY IR/ROXICODONE) 5 MG immediate release tablet, Take 1 tablet (5 mg total) by mouth every 4 (four) hours as needed for severe pain., Disp: 60 tablet,  Rfl: 0 .  polyethylene glycol (MIRALAX / GLYCOLAX) 17 g packet, Take 17 g 1-3 times a day as needed for constipation, Disp: 42 each, Rfl: 0 .  predniSONE (DELTASONE) 20 MG tablet, Take 1 tablet (20 mg total) by mouth daily with breakfast. (Patient taking differently: Take 20 mg by mouth 2 (two) times daily with a meal. ), Disp: 30 tablet, Rfl: 0 .  senna-docusate (SENOKOT-S) 8.6-50 MG tablet, Take 1 tablet 1-2 times a day as needed for constipation, Disp: 90 tablet, Rfl: 0  Review of Systems:  Constitutional: Denies fever, chills, diaphoresis, appetite change and fatigue.  HEENT: Denies photophobia, eye pain, redness, hearing loss, ear pain, congestion, sore throat, rhinorrhea, sneezing, mouth sores, trouble swallowing, neck pain, neck stiffness and tinnitus.   Respiratory: Denies SOB, DOE, cough, chest tightness,  and wheezing.   Cardiovascular: Denies chest pain, palpitations and leg swelling.  Gastrointestinal: Denies nausea, vomiting, abdominal pain, diarrhea, constipation, blood in stool and abdominal distention.  Genitourinary: Denies dysuria, urgency, frequency, hematuria, flank pain and difficulty urinating.  Endocrine: Denies: hot or cold intolerance, sweats, changes in hair or nails, polyuria, polydipsia. Musculoskeletal: Denies myalgias,  joint swelling, arthralgias and gait problem.  Skin: Denies pallor, rash and wound.  Neurological: Denies dizziness, seizures, syncope, weakness, light-headedness, numbness and headaches.  Hematological: Denies adenopathy. Easy bruising, personal or family bleeding history  Psychiatric/Behavioral: Denies suicidal ideation, mood changes, confusion, nervousness, sleep disturbance and agitation    Physical Exam: Vitals:   06/16/19 1343  BP: 124/70  Pulse: 100  Temp: (!) 97.3 F (36.3 C)  TempSrc: Temporal  SpO2: 96%  Weight: 97 lb (44 kg)    Body mass index is 18.94 kg/m.   Constitutional: NAD, calm, comfortable Eyes: PERRL, lids and  conjunctivae normal ENMT: Mucous membranes are moist.  Neck: normal, supple, no masses, no thyromegaly Respiratory: clear to auscultation bilaterally, no wheezing, no crackles. Normal respiratory effort. No accessory muscle use.  Cardiovascular: Regular rate and rhythm, no murmurs / rubs / gallops. No extremity edema. 2+ pedal pulses.  Abdomen: no tenderness,Bowel sounds positive.  Musculoskeletal: no clubbing / cyanosis. No joint deformity upper and lower extremities. Good ROM, no contractures. Normal muscle tone.  Skin: no rashes, lesions, ulcers. No induration Neurologic: Grossly intact and nonfocal Psychiatric: Normal judgment and insight. Alert and oriented x 3. Normal mood.    Impression and Plan:  Hospital discharge follow-up Severe pain Intractable back pain CLL (chronic lymphocytic leukemia) (HCC) Closed wedge compression fracture of T8 vertebra, initial encounter (HCC) Constipation due to opioid therapy  -Pain regimen continues to be adjusted outpatient, she has had significant issues with constipation, have advised senna 1 tablet twice daily, MiraLAX at least once daily but can increase to 2 if she has not had a bowel movement in 2 days, can consider mag citrate and she knows to notify me in 3  days if no bowel movements.     Patient Instructions  -Nice seeing you today!!  -Senna twice daily and miralax twice daily. If no BM in 2 days take miralax twice daily. If no BM in 3 days call me for advice.       Lelon Frohlich, MD [facility]

## 2019-06-16 NOTE — Telephone Encounter (Signed)
I saw notes from palliative care Does she want to stay on 5 mg or try 10 mg?

## 2019-06-16 NOTE — Telephone Encounter (Signed)
She called and left a message. Requesting a refill on Oxycodone and ask that Rx be sent to [facility].

## 2019-06-16 NOTE — Patient Instructions (Signed)
-  Nice seeing you today!!  -Senna twice daily and miralax twice daily. If no BM in 2 days take miralax twice daily. If no BM in 3 days call me for advice.

## 2019-06-16 NOTE — Telephone Encounter (Signed)
Refill of 5 mg sent

## 2019-06-16 NOTE — Telephone Encounter (Signed)
Called and given below message. She verbalized understanding. She would like the 5 mg tablets, they are helping with the pain. She still has about 6 of the 10 mg tablets and does not feel she needs them now.

## 2019-06-18 ENCOUNTER — Telehealth: Payer: Self-pay | Admitting: Hematology and Oncology

## 2019-06-18 ENCOUNTER — Other Ambulatory Visit: Payer: Self-pay

## 2019-06-18 ENCOUNTER — Other Ambulatory Visit: Payer: Medicare Other

## 2019-06-18 ENCOUNTER — Encounter: Payer: Self-pay | Admitting: Hematology and Oncology

## 2019-06-18 ENCOUNTER — Ambulatory Visit: Payer: Medicare Other | Admitting: Hematology and Oncology

## 2019-06-18 ENCOUNTER — Inpatient Hospital Stay: Payer: Medicare Other

## 2019-06-18 ENCOUNTER — Other Ambulatory Visit: Payer: Self-pay | Admitting: Hematology and Oncology

## 2019-06-18 ENCOUNTER — Inpatient Hospital Stay (HOSPITAL_BASED_OUTPATIENT_CLINIC_OR_DEPARTMENT_OTHER): Payer: Medicare Other | Admitting: Hematology and Oncology

## 2019-06-18 VITALS — BP 138/79 | HR 107 | Temp 98.9°F | Resp 17 | Ht 60.0 in | Wt 104.0 lb

## 2019-06-18 DIAGNOSIS — R5381 Other malaise: Secondary | ICD-10-CM

## 2019-06-18 DIAGNOSIS — C9110 Chronic lymphocytic leukemia of B-cell type not having achieved remission: Secondary | ICD-10-CM

## 2019-06-18 DIAGNOSIS — M549 Dorsalgia, unspecified: Secondary | ICD-10-CM

## 2019-06-18 DIAGNOSIS — K5909 Other constipation: Secondary | ICD-10-CM

## 2019-06-18 DIAGNOSIS — G893 Neoplasm related pain (acute) (chronic): Secondary | ICD-10-CM | POA: Diagnosis not present

## 2019-06-18 DIAGNOSIS — Z5111 Encounter for antineoplastic chemotherapy: Secondary | ICD-10-CM | POA: Diagnosis not present

## 2019-06-18 DIAGNOSIS — M62838 Other muscle spasm: Secondary | ICD-10-CM | POA: Diagnosis not present

## 2019-06-18 LAB — CBC WITH DIFFERENTIAL/PLATELET
Abs Immature Granulocytes: 0.05 10*3/uL (ref 0.00–0.07)
Basophils Absolute: 0 10*3/uL (ref 0.0–0.1)
Basophils Relative: 0 %
Eosinophils Absolute: 0 10*3/uL (ref 0.0–0.5)
Eosinophils Relative: 0 %
HCT: 34.8 % — ABNORMAL LOW (ref 36.0–46.0)
Hemoglobin: 11.6 g/dL — ABNORMAL LOW (ref 12.0–15.0)
Immature Granulocytes: 1 %
Lymphocytes Relative: 38 %
Lymphs Abs: 3.3 10*3/uL (ref 0.7–4.0)
MCH: 35.3 pg — ABNORMAL HIGH (ref 26.0–34.0)
MCHC: 33.3 g/dL (ref 30.0–36.0)
MCV: 105.8 fL — ABNORMAL HIGH (ref 80.0–100.0)
Monocytes Absolute: 0.5 10*3/uL (ref 0.1–1.0)
Monocytes Relative: 6 %
Neutro Abs: 4.7 10*3/uL (ref 1.7–7.7)
Neutrophils Relative %: 55 %
Platelets: 159 10*3/uL (ref 150–400)
RBC: 3.29 MIL/uL — ABNORMAL LOW (ref 3.87–5.11)
RDW: 15.2 % (ref 11.5–15.5)
WBC: 8.6 10*3/uL (ref 4.0–10.5)
nRBC: 0 % (ref 0.0–0.2)

## 2019-06-18 LAB — COMPREHENSIVE METABOLIC PANEL
ALT: 30 U/L (ref 0–44)
AST: 12 U/L — ABNORMAL LOW (ref 15–41)
Albumin: 3.8 g/dL (ref 3.5–5.0)
Alkaline Phosphatase: 182 U/L — ABNORMAL HIGH (ref 38–126)
Anion gap: 10 (ref 5–15)
BUN: 10 mg/dL (ref 8–23)
CO2: 26 mmol/L (ref 22–32)
Calcium: 9.3 mg/dL (ref 8.9–10.3)
Chloride: 102 mmol/L (ref 98–111)
Creatinine, Ser: 0.73 mg/dL (ref 0.44–1.00)
GFR calc Af Amer: >60 mL/min (ref >60)
GFR calc non Af Amer: >60 mL/min (ref >60)
Glucose, Bld: 135 mg/dL — ABNORMAL HIGH (ref 70–99)
Potassium: 4 mmol/L (ref 3.5–5.1)
Sodium: 138 mmol/L (ref 135–145)
Total Bilirubin: 1 mg/dL (ref 0.3–1.2)
Total Protein: 6.5 g/dL (ref 6.5–8.1)

## 2019-06-18 LAB — URIC ACID: Uric Acid, Serum: 2 mg/dL — ABNORMAL LOW (ref 2.5–7.1)

## 2019-06-18 LAB — HEPATITIS B SURFACE ANTIBODY,QUALITATIVE: Hep B S Ab: NONREACTIVE

## 2019-06-18 LAB — HEPATITIS B SURFACE ANTIGEN: Hepatitis B Surface Ag: NONREACTIVE

## 2019-06-18 LAB — HEPATITIS B CORE ANTIBODY, IGM: Hep B C IgM: NONREACTIVE

## 2019-06-18 LAB — LACTATE DEHYDROGENASE: LDH: 183 U/L (ref 98–192)

## 2019-06-18 MED ORDER — METHADONE HCL 10 MG PO TABS: 10.0000 mg | ORAL_TABLET | Freq: Three times a day (TID) | ORAL | 0 refills | Status: DC

## 2019-06-18 MED ORDER — CYCLOBENZAPRINE HCL 5 MG PO TABS: 5.0000 mg | ORAL_TABLET | Freq: Three times a day (TID) | ORAL | 0 refills | Status: DC | PRN

## 2019-06-18 MED ORDER — OXYCODONE-ACETAMINOPHEN 5-325 MG PO TABS
ORAL_TABLET | ORAL | Status: AC
  Filled 2019-06-18: qty 2

## 2019-06-18 MED ORDER — OXYCODONE-ACETAMINOPHEN 5-325 MG PO TABS
2.0000 | ORAL_TABLET | Freq: Once | ORAL | Status: AC
  Administered 2019-06-18: 2 via ORAL

## 2019-06-18 MED ORDER — BISACODYL 10 MG RE SUPP: 10.0000 mg | RECTAL | 0 refills | Status: DC | PRN

## 2019-06-18 MED ORDER — PREDNISONE 20 MG PO TABS: 20.0000 mg | ORAL_TABLET | Freq: Every day | ORAL | 0 refills | Status: DC

## 2019-06-18 MED FILL — METHADONE HCL 10 MG TABLET: 10 | 10 days supply | Qty: 30 | Fill #0

## 2019-06-18 MED FILL — CYCLOBENZAPRINE HCL 5 MG TA: 5 | 10 days supply | Qty: 30 | Fill #0

## 2019-06-18 MED FILL — predniSONE 20 MG TABS: 20 | 30 days supply | Qty: 30 | Fill #0

## 2019-06-18 NOTE — Progress Notes (Signed)
[facility] OFFICE PROGRESS NOTE  Patient Care Team: Isaac Bliss, Rayford Halsted, MD as PCP - General (Internal Medicine) Julianne Handler, NP as Nurse Practitioner (Hospice and Palliative Medicine)  ASSESSMENT & PLAN:  CLL (chronic lymphocytic leukemia) ([place]) She has achieved complete hematological response with just 2 doses of bendamustine Due to her ongoing uncontrolled pain and other health issues, I will hold off further chemotherapy and focus on supportive care  Intractable back pain She has intractable back pain since recent vertebral fracture She has intermittent, severe muscle spasm I will try to consult IR to see if kyphoplasty is appropriate In the meantime, I will continue to manage her pain  Cancer associated pain She has multifactorial pain, component of muscle spasm related to recent fall/fracture and recent splenomegaly Despite taking regular doses of oxycodone, she is still having a lot of pain I recommend introduction of low-dose methadone for long-acting pain medicine along with oxycodone as needed I also recommend scheduled Tylenol with each dose of oxycodone I also recommend Flexeril as needed for component of muscle spasm I warned her and her husband related to side effects of sedation, constipation and risk of falls with multiple different treatment modality I will assess pain control again next week  Other constipation She has severe constipation secondary to narcotic prescription I recommend schedule MiraLAX twice a day along with Senokot I also recommend a trial of suppository  Physical debility She is scheduled with home physical therapy I recommend a brace to use but her physical therapist will have to measure it   Orders Placed This Encounter  Procedures  . IR KYPHO THORACIC WITH BONE BIOPSY    Standing Status:   Future    Standing Expiration Date:   08/17/2020    Order Specific Question:   Reason for Exam (SYMPTOM  OR DIAGNOSIS  REQUIRED)    Answer:   compression fracture, severe pain, need kyphoplasty. no need bone biopsy    Order Specific Question:   Preferred Imaging Location?    Answer:   [facility]  . Lactate dehydrogenase    INTERVAL HISTORY: Please see below for problem oriented charting. She returns with her husband for further follow-up She continues to have severe, uncontrolled pain Based on her description, she had intermittent muscle spasm especially on the right flank area She have difficulty sleeping because of this She remained extremely debilitated due to poorly controlled pain She denies nausea She had some constipation No recent falls Her appetite is stable  SUMMARY OF ONCOLOGIC HISTORY: Oncology History Overview Note  Del 13 q   CLL (chronic lymphocytic leukemia) ([place])  03/15/2009 Pathology Results   Case #: [id number]  flow cytometry of peripheral blood comfirmed CLL.   04/25/2017 Pathology Results   FISH panel is positive for deletion 13 q only   04/22/2019 Imaging   Ct scan of the chest, abdomen and pelvis 1. No adenopathy within the chest, abdomen or pelvis. 2. Splenomegaly with scattered low-density lesions measuring up to 1.3 cm. 3. Small subpleural nodule in the left lower lobe and right middle lobe measuring up to 5 mm. No follow-up needed if patient is low-risk (and has no known or suspected primary neoplasm). Non-contrast chest CT can be considered in 12 months if patient is high-risk. This recommendation follows the consensus statement: Guidelines for Management of Incidental Pulmonary Nodules Detected on CT Images: From the Fleischner Society 2017; Radiology 2017; 284:[id number]. 4. Aortic Atherosclerosis (ICD10-I70.0). Coronary artery atherosclerotic calcifications.  04/23/2019 Cancer Staging   Staging form: Chronic Lymphocytic Leukemia / Small Lymphocytic Lymphoma, AJCC 8th Edition - Clinical stage from 04/23/2019: Modified Rai Stage IV (Modified Rai risk: High,  Lymphocytosis: Present, Adenopathy: Absent, Organomegaly: Present, Anemia: Absent, Thrombocytopenia: Present) - Signed by Heath Lark, MD on 04/23/2019   04/28/2019 -  Chemotherapy   The patient had Ibrutinib for chemotherapy treatment.     06/01/2019 -  Chemotherapy   The patient had Bendamustine for chemotherapy   06/02/2019 Imaging   Ct head, chest, abdomen and pelvis 1. New T8 compression deformity with approximately 30% height loss. Worsening anterior compression deformity of the T11 vertebrae now with 20% height loss anteriorly. Additional T12 and L2 compression deformities are stable from prior. 2. No other acute traumatic injury within the chest, abdomen, or pelvis. Specifically, no splenic injury or retroperitoneal hematoma. 3. Mild intra and extrahepatic biliary ductal dilatation with a small calcified gallstone seen in the distal common bile duct just proximal to the ampulla of Vater. Correlate with LFTs and consider further evaluation with MRCP as clinically indicated. 4. Stable sub 5 mm nodules in the right middle and left lower lobe. No further evaluation is required if patient is low risk however should continue with previously recommended 12 month follow-up in September of 2021 if patient is high risk. 5. Severe scoliotic curvature of the thoracolumbar spine with associated chest wall deformity. 6. Duodenal and colonic diverticula without features of diverticulitis. 7. Moderate bladder distention, correlate for features of outlet obstruction or retention. 8. Aortic Atherosclerosis (ICD10-I70.0).   06/07/2019 - 06/10/2019 Hospital Admission   She was admitted for management of LUQ and back pain   06/08/2019 Imaging   MRI abdomen 1. Mild intra and extrahepatic biliary duct prominence with common bile duct measuring upper normal for patient age. No gallstones evident and no definite choledocholithiasis. Somewhat abrupt transition of the duct into the ampulla noted, but nonspecific.  ERCP could be used to further evaluate for ampullary lesion as clinically warranted. 2. Hepatic and renal cysts. 3. Presumed marked distention of the urinary bladder.   06/08/2019 Imaging   Ct angiogram No gross evidence of large or central pulmonary emboli is identified on exam limited secondary to suboptimal pulmonary arterial opacification and respiratory motion.   Bibasilar atelectasis.   Osseous demineralization with compression fractures of several thoracic vertebra.   Aneurysmal dilatation ascending thoracic aorta 4.1 cm transverse, recommendation below.   Recommend annual imaging followup by CTA or MRA.   Aortic Atherosclerosis (ICD10-I70.0).   Aortic aneurysm NOS     REVIEW OF SYSTEMS:   Constitutional: Denies fevers, chills or abnormal weight loss Eyes: Denies blurriness of vision Ears, nose, mouth, throat, and face: Denies mucositis or sore throat Respiratory: Denies cough, dyspnea or wheezes Cardiovascular: Denies palpitation, chest discomfort or lower extremity swelling Skin: Denies abnormal skin rashes Lymphatics: Denies new lymphadenopathy or easy bruising Behavioral/Psych: Mood is stable, no new changes  All other systems were reviewed with the patient and are negative.  I have reviewed the past medical history, past surgical history, social history and family history with the patient and they are unchanged from previous note.  ALLERGIES:  is allergic to codeine and levofloxacin.  MEDICATIONS:  Current Outpatient Medications  Medication Sig Dispense Refill  . acetaminophen (TYLENOL) 500 MG tablet Take 2 tablets (1,000 mg total) by mouth every 8 (eight) hours. 180 tablet 11  . allopurinol (ZYLOPRIM) 300 MG tablet Take 1 tablet (300 mg total) by mouth daily. 30 tablet 3  .  amLODipine (NORVASC) 5 MG tablet Take 1 tablet (5 mg total) by mouth daily. 30 tablet 0  . augmented betamethasone dipropionate (DIPROLENE-AF) 0.05 % cream Apply 1 application topically 2  (two) times daily as needed (flare ups).   2  . bisacodyl (DULCOLAX) 10 MG suppository Place 1 suppository (10 mg total) rectally as needed for moderate constipation. 12 suppository 0  . cyclobenzaprine (FLEXERIL) 5 MG tablet Take 1 tablet (5 mg total) by mouth 3 (three) times daily as needed for muscle spasms. 30 tablet 0  . lidocaine (LIDODERM) 5 % Place 1 patch onto the skin every 12 (twelve) hours. Remove & Discard patch within 12 hours or as directed by MD 60 patch 0  . methadone (DOLOPHINE) 10 MG tablet Take 1 tablet (10 mg total) by mouth every 8 (eight) hours. 30 tablet 0  . oxyCODONE (OXY IR/ROXICODONE) 5 MG immediate release tablet Take 1 tablet (5 mg total) by mouth every 4 (four) hours as needed for severe pain. 60 tablet 0  . polyethylene glycol (MIRALAX / GLYCOLAX) 17 g packet Take 17 g 1-3 times a day as needed for constipation 42 each 0  . predniSONE (DELTASONE) 20 MG tablet Take 1 tablet (20 mg total) by mouth daily with breakfast. 30 tablet 0  . senna-docusate (SENOKOT-S) 8.6-50 MG tablet Take 1 tablet 1-2 times a day as needed for constipation 90 tablet 0   No current facility-administered medications for this visit.     PHYSICAL EXAMINATION: ECOG PERFORMANCE STATUS: 2 - Symptomatic, <50% confined to bed  Vitals:   06/18/19 1239  BP: 138/79  Pulse: (!) 107  Resp: 17  Temp: 98.9 F (37.2 C)  SpO2: 98%   Filed Weights   06/18/19 1239  Weight: 104 lb (47.2 kg)    GENERAL:alert, in mild distress with pain NEURO: alert & oriented x 3 with fluent speech, no focal motor/sensory deficits  LABORATORY DATA:  I have reviewed the data as listed    Component Value Date/Time   NA 138 06/18/2019 1215   NA 141 04/25/2017 1041   K 4.0 06/18/2019 1215   K 4.6 04/25/2017 1041   CL 102 06/18/2019 1215   CL 104 10/27/2012 1209   CO2 26 06/18/2019 1215   CO2 26 04/25/2017 1041   GLUCOSE 135 (H) 06/18/2019 1215   GLUCOSE 97 04/25/2017 1041   GLUCOSE 104 (H) 10/27/2012 1209    BUN 10 06/18/2019 1215   BUN 13.1 04/25/2017 1041   CREATININE 0.73 06/18/2019 1215   CREATININE 0.8 04/25/2017 1041   CALCIUM 9.3 06/18/2019 1215   CALCIUM 9.8 04/25/2017 1041   PROT 6.5 06/18/2019 1215   PROT 7.5 04/25/2017 1041   ALBUMIN 3.8 06/18/2019 1215   ALBUMIN 4.4 04/25/2017 1041   AST 12 (L) 06/18/2019 1215   AST 23 04/25/2017 1041   ALT 30 06/18/2019 1215   ALT 14 04/25/2017 1041   ALKPHOS 182 (H) 06/18/2019 1215   ALKPHOS 69 04/25/2017 1041   BILITOT 1.0 06/18/2019 1215   BILITOT 0.95 04/25/2017 1041   GFRNONAA >60 06/18/2019 1215   GFRAA >60 06/18/2019 1215    No results found for: SPEP, UPEP  Lab Results  Component Value Date   WBC 8.6 06/18/2019   NEUTROABS 4.7 06/18/2019   HGB 11.6 (L) 06/18/2019   HCT 34.8 (L) 06/18/2019   MCV 105.8 (H) 06/18/2019   PLT 159 06/18/2019      Chemistry      Component Value Date/Time   NA  138 06/18/2019 1215   NA 141 04/25/2017 1041   K 4.0 06/18/2019 1215   K 4.6 04/25/2017 1041   CL 102 06/18/2019 1215   CL 104 10/27/2012 1209   CO2 26 06/18/2019 1215   CO2 26 04/25/2017 1041   BUN 10 06/18/2019 1215   BUN 13.1 04/25/2017 1041   CREATININE 0.73 06/18/2019 1215   CREATININE 0.8 04/25/2017 1041      Component Value Date/Time   CALCIUM 9.3 06/18/2019 1215   CALCIUM 9.8 04/25/2017 1041   ALKPHOS 182 (H) 06/18/2019 1215   ALKPHOS 69 04/25/2017 1041   AST 12 (L) 06/18/2019 1215   AST 23 04/25/2017 1041   ALT 30 06/18/2019 1215   ALT 14 04/25/2017 1041   BILITOT 1.0 06/18/2019 1215   BILITOT 0.95 04/25/2017 1041       RADIOGRAPHIC STUDIES: I have personally reviewed the radiological images as listed and agreed with the findings in the report. Dg Chest 2 View  Result Date: 06/07/2019 CLINICAL DATA:  Chest pain EXAM: CHEST - 2 VIEW COMPARISON:  June 02, 2019 FINDINGS: The cardiomediastinal silhouette is unchanged from prior exam with mild cardiomegaly. There is opacity in the right upper lung which is  likely due to overlap of shadows from positioning. The left lung is clear. No pleural effusion or focal airspace consolidation. Again noted is diffuse osteopenia and scoliotic curvature. IMPRESSION: Opacity in the right upper lung which is likely thought to be due to overlap of shadows. If clinical concern remains would recommend repeat radiograph. Electronically Signed   By: Prudencio Pair M.D.   On: 06/07/2019 22:13   Ct Head Wo Contrast  Result Date: 06/03/2019 CLINICAL DATA:  Fall, headache EXAM: CT HEAD WITHOUT CONTRAST TECHNIQUE: Contiguous axial images were obtained from the base of the skull through the vertex without intravenous contrast. COMPARISON:  MRI 05/04/2013 FINDINGS: Brain: There is atrophy and chronic small vessel disease changes. No acute intracranial abnormality. Specifically, no hemorrhage, hydrocephalus, mass lesion, acute infarction, or significant intracranial injury. Vascular: No hyperdense vessel or unexpected calcification. Skull: No acute calvarial abnormality. Sinuses/Orbits: No acute finding Other: None IMPRESSION: Atrophy, chronic microvascular disease. No acute intracranial abnormality. Electronically Signed   By: Rolm Baptise M.D.   On: 06/03/2019 00:07   Ct Chest W Contrast  Result Date: 06/03/2019 CLINICAL DATA:  Fall from standing, history of CLL, flank pain and tenderness evaluate for splenic injury or retroperitoneal hematoma. Left rib pain. EXAM: CT CHEST, ABDOMEN, AND PELVIS WITH CONTRAST TECHNIQUE: Multidetector CT imaging of the chest, abdomen and pelvis was performed following the standard protocol during bolus administration of intravenous contrast. CONTRAST:  134mL OMNIPAQUE IOHEXOL 300 MG/ML  SOLN COMPARISON:  CT 04/22/2019 FINDINGS: CT CHEST FINDINGS Cardiovascular: The aortic root is suboptimally assessed given cardiac pulsation artifact. No acute luminal abnormality of the thoracic aorta. Minimal atheromatous plaque. No periaortic stranding or hemorrhage.  Slightly tortuous course of the thoracic aorta. Normal 3 vessel branching of the arch mild tortuosity of the brachiocephalic vessels. Proximal great vessels are otherwise unremarkable. Normal heart size. No pericardial effusion. Coronary artery calcifications are noted. Central pulmonary arteries are normal caliber without large central filling defects on this non tailored examination. Mediastinum/Nodes: Thyroid gland and thoracic inlet are unremarkable. No mediastinal hematoma or pneumomediastinum. No acute traumatic abnormality of the trachea or esophagus. No mediastinal, hilar or axillary adenopathy. Lungs/Pleura: Mild interlobular septal thickening is noted towards the lung apices and in the lung bases as well. Bandlike areas of opacity in  the lower lobes and right middle lobe and lingula likely reflect areas of scarring and/or atelectasis. More dependent atelectasis is seen posteriorly. No effusion. No visible pneumothorax. Few stable sub 5 mm nodules are present in the right middle lobe (5/83, 5/65) and left lower lobe (5/87). Musculoskeletal: Inferior endplate compression deformity of the T8 vertebral body with approximately 30% height loss. Slightly increased anterior wedging compression deformity of the T11 vertebrae when compared to prior study from 04/22/2019 with 20% height loss anteriorly. Anterior wedging of the T12 vertebrae is unchanged. No other acute vertebral fracture or compression deformity is seen remote posttraumatic deformity of left mid clavicle. Additional remote posttraumatic deformities of the left ninth through eleventh ribs are noted. When compared to no acute displaced rib fractures. No other acute or suspicious osseous lesions. Severe dextrocurvature of the midthoracic spine with compensatory curvature of the lower thoracic spine and associated chest wall deformity. CT ABDOMEN PELVIS FINDINGS Hepatobiliary: No direct hepatic injury or perihepatic hematoma. Subcentimeter  hypoattenuating focus along the anterior dome of the liver (4/40) too small to fully characterize on CT imaging but statistically likely benign. No concerning hepatic lesions. Gallbladder is normal. No pericholecystic inflammation or fluid. There is mild intra and extrahepatic biliary ductal dilatation with a small calcified gallstone seen in the distal common bile duct just proximal to the ampulla of Vater (4/60-61). Pancreas: Mild pancreatic atrophy. No pancreatic ductal dilatation or surrounding inflammatory changes. Spleen: No direct splenic injury or perisplenic hematoma. No suspicious splenic lesions. Spleen at the upper limits of normal for size. Adrenals/Urinary Tract: No adrenal hemorrhage or suspicious adrenal lesions. No direct renal injury or perirenal hemorrhage. No extravasation of contrast is seen on excretory phase delayed imaging. Multiple fluid attenuation parapelvic left renal cysts are similar to comparison CT from September of 2020. Kidneys are otherwise unremarkable, without renal calculi, suspicious lesion, or hydronephrosis. Bladder is moderately distended but otherwise unremarkable. Stomach/Bowel: Distal esophagus and stomach are unremarkable. Air-filled 2 cm duodenal diverticulum (4/53), slightly more distended than on prior study but without adjacent inflammation. No small bowel dilatation or wall thickening. Cecum is displaced into the midline pelvis the appendix is not well visualized though no pericecal inflammation is seen. There is a moderate stool burden throughout the colon. No colonic dilatation or wall thickening however. Colonic anastomosis noted in the midline upper abdomen. Distal colonic diverticulosis without acute peridiverticular inflammation to suggest diverticulitis. Vascular/Lymphatic: Atherosclerotic plaque within the normal caliber aorta. No suspicious or enlarged lymph nodes in the included lymphatic chains. Reproductive: The prostate and seminal vesicles are  unremarkable. Other: No free fluid or free air. No bowel containing hernias. No evidence of retroperitoneal or body wall hematoma. No significant soft tissue contusion though portions of the lateral most flanks are partially collimated from view due to patient size. Musculoskeletal: Superior endplate compression deformity of L2 is unchanged from comparison CT 04/22/2019. Dextrocurvature of the lumbar spine centered at L3. Multilevel degenerative changes are present in the imaged portions of the spine. Features most pronounced at L1-2 and L5-S1. IMPRESSION: 1. New T8 compression deformity with approximately 30% height loss. Worsening anterior compression deformity of the T11 vertebrae now with 20% height loss anteriorly. Additional T12 and L2 compression deformities are stable from prior. 2. No other acute traumatic injury within the chest, abdomen, or pelvis. Specifically, no splenic injury or retroperitoneal hematoma. 3. Mild intra and extrahepatic biliary ductal dilatation with a small calcified gallstone seen in the distal common bile duct just proximal to the ampulla of Vater.  Correlate with LFTs and consider further evaluation with MRCP as clinically indicated. 4. Stable sub 5 mm nodules in the right middle and left lower lobe. No further evaluation is required if patient is low risk however should continue with previously recommended 12 month follow-up in September of 2021 if patient is high risk. 5. Severe scoliotic curvature of the thoracolumbar spine with associated chest wall deformity. 6. Duodenal and colonic diverticula without features of diverticulitis. 7. Moderate bladder distention, correlate for features of outlet obstruction or retention. 8. Aortic Atherosclerosis (ICD10-I70.0). Electronically Signed   By: Lovena Le M.D.   On: 06/03/2019 00:03   Ct Angio Chest Pe W Or Wo Contrast  Result Date: 06/08/2019 CLINICAL DATA:  Elevated D-dimer, pain, history CLL, hypertension EXAM: CT ANGIOGRAPHY  CHEST WITH CONTRAST TECHNIQUE: Multidetector CT imaging of the chest was performed using the standard protocol during bolus administration of intravenous contrast. Multiplanar CT image reconstructions and MIPs were obtained to evaluate the vascular anatomy. CONTRAST:  171mL OMNIPAQUE IOHEXOL 350 MG/ML SOLN IV COMPARISON:  CT chest 06/02/2019 FINDINGS: Cardiovascular: Atherosclerotic calcifications aorta, proximal great vessels, and minimally in coronary arteries. Aneurysmal dilatation of ascending thoracic aorta 4.1 cm transverse. No evidence of aortic dissection. Pulmonary arteries are suboptimally opacified as well as limited by respiratory motion. No definite large or central pulmonary emboli are visualized. Unable to exclude small and peripheral emboli by this study. No pericardial effusion. Enlargement of cardiac chambers. Mediastinum/Nodes: Esophagus unremarkable. Base of cervical region normal appearance. Lungs/Pleura: Bibasilar atelectasis in the lower lobes. Remaining lungs grossly clear. No pleural effusion or pneumothorax. Upper Abdomen: Spleen appears prominent in size but incompletely visualized. Remaining visualized upper abdomen unremarkable Musculoskeletal: Significant cervicothoracic scoliosis. Compression deformities of several thoracic vertebra are again seen. Review of the MIP images confirms the above findings. IMPRESSION: No gross evidence of large or central pulmonary emboli is identified on exam limited secondary to suboptimal pulmonary arterial opacification and respiratory motion. Bibasilar atelectasis. Osseous demineralization with compression fractures of several thoracic vertebra. Aneurysmal dilatation ascending thoracic aorta 4.1 cm transverse, recommendation below. Recommend annual imaging followup by CTA or MRA. This recommendation follows 2010 ACCF/AHA/AATS/ACR/ASA/SCA/SCAI/SIR/STS/SVM Guidelines for the Diagnosis and Management of Patients with Thoracic Aortic Disease. Circulation.  2010; [id number]. Aortic aneurysm NOS (ICD10-I71.9) Aortic Atherosclerosis (ICD10-I70.0). Aortic aneurysm NOS (ICD10-I71.9).  For Electronically Signed   By: Lavonia Dana M.D.   On: 06/08/2019 17:12   US Abdomen Complete  Result Date: 06/08/2019 CLINICAL DATA:  Abdominal distention. EXAM: ABDOMEN ULTRASOUND COMPLETE COMPARISON:  CT 06/02/2019. FINDINGS: Gallbladder: No gallstones or wall thickening visualized. No sonographic Murphy sign noted by sonographer. Common bile duct: Diameter: 8.5 mm. Intrahepatic biliary ductal dilatation also noted. Distal common bile duct stone suspected on prior CT. Again MRCP may prove useful for further evaluation. Liver: No focal lesion identified. Within normal limits in parenchymal echogenicity. Portal vein is patent on color Doppler imaging with normal direction of blood flow towards the liver. IVC: No abnormality visualized. Pancreas: Visualized portion unremarkable. Spleen: Size and appearance within normal limits. Right Kidney: Length: 8.6 cm. Echogenicity within normal limits. No mass or hydronephrosis visualized. Left Kidney: Length: 9.5 cm. Echogenicity within normal limits. No mass or hydronephrosis visualized. Abdominal aorta: No aneurysm visualized. Other findings: No ascites. IMPRESSION: 1. Common bile duct is dilated 8.5 mm. Intrahepatic biliary ductal dilatation also noted. Distal common bile duct stone suspected on prior CT of 06/02/2019. Again MRCP may prove useful for further evaluation. 2.  No other abnormality identified.  No ascites.  Electronically Signed   By: Marcello Moores  Register   On: 06/08/2019 07:05   Ct Abdomen Pelvis W Contrast  Result Date: 06/03/2019 CLINICAL DATA:  Fall from standing, history of CLL, flank pain and tenderness evaluate for splenic injury or retroperitoneal hematoma. Left rib pain. EXAM: CT CHEST, ABDOMEN, AND PELVIS WITH CONTRAST TECHNIQUE: Multidetector CT imaging of the chest, abdomen and pelvis was performed following the  standard protocol during bolus administration of intravenous contrast. CONTRAST:  155mL OMNIPAQUE IOHEXOL 300 MG/ML  SOLN COMPARISON:  CT 04/22/2019 FINDINGS: CT CHEST FINDINGS Cardiovascular: The aortic root is suboptimally assessed given cardiac pulsation artifact. No acute luminal abnormality of the thoracic aorta. Minimal atheromatous plaque. No periaortic stranding or hemorrhage. Slightly tortuous course of the thoracic aorta. Normal 3 vessel branching of the arch mild tortuosity of the brachiocephalic vessels. Proximal great vessels are otherwise unremarkable. Normal heart size. No pericardial effusion. Coronary artery calcifications are noted. Central pulmonary arteries are normal caliber without large central filling defects on this non tailored examination. Mediastinum/Nodes: Thyroid gland and thoracic inlet are unremarkable. No mediastinal hematoma or pneumomediastinum. No acute traumatic abnormality of the trachea or esophagus. No mediastinal, hilar or axillary adenopathy. Lungs/Pleura: Mild interlobular septal thickening is noted towards the lung apices and in the lung bases as well. Bandlike areas of opacity in the lower lobes and right middle lobe and lingula likely reflect areas of scarring and/or atelectasis. More dependent atelectasis is seen posteriorly. No effusion. No visible pneumothorax. Few stable sub 5 mm nodules are present in the right middle lobe (5/83, 5/65) and left lower lobe (5/87). Musculoskeletal: Inferior endplate compression deformity of the T8 vertebral body with approximately 30% height loss. Slightly increased anterior wedging compression deformity of the T11 vertebrae when compared to prior study from 04/22/2019 with 20% height loss anteriorly. Anterior wedging of the T12 vertebrae is unchanged. No other acute vertebral fracture or compression deformity is seen remote posttraumatic deformity of left mid clavicle. Additional remote posttraumatic deformities of the left ninth  through eleventh ribs are noted. When compared to no acute displaced rib fractures. No other acute or suspicious osseous lesions. Severe dextrocurvature of the midthoracic spine with compensatory curvature of the lower thoracic spine and associated chest wall deformity. CT ABDOMEN PELVIS FINDINGS Hepatobiliary: No direct hepatic injury or perihepatic hematoma. Subcentimeter hypoattenuating focus along the anterior dome of the liver (4/40) too small to fully characterize on CT imaging but statistically likely benign. No concerning hepatic lesions. Gallbladder is normal. No pericholecystic inflammation or fluid. There is mild intra and extrahepatic biliary ductal dilatation with a small calcified gallstone seen in the distal common bile duct just proximal to the ampulla of Vater (4/60-61). Pancreas: Mild pancreatic atrophy. No pancreatic ductal dilatation or surrounding inflammatory changes. Spleen: No direct splenic injury or perisplenic hematoma. No suspicious splenic lesions. Spleen at the upper limits of normal for size. Adrenals/Urinary Tract: No adrenal hemorrhage or suspicious adrenal lesions. No direct renal injury or perirenal hemorrhage. No extravasation of contrast is seen on excretory phase delayed imaging. Multiple fluid attenuation parapelvic left renal cysts are similar to comparison CT from September of 2020. Kidneys are otherwise unremarkable, without renal calculi, suspicious lesion, or hydronephrosis. Bladder is moderately distended but otherwise unremarkable. Stomach/Bowel: Distal esophagus and stomach are unremarkable. Air-filled 2 cm duodenal diverticulum (4/53), slightly more distended than on prior study but without adjacent inflammation. No small bowel dilatation or wall thickening. Cecum is displaced into the midline pelvis the appendix is not well visualized though no pericecal  inflammation is seen. There is a moderate stool burden throughout the colon. No colonic dilatation or wall  thickening however. Colonic anastomosis noted in the midline upper abdomen. Distal colonic diverticulosis without acute peridiverticular inflammation to suggest diverticulitis. Vascular/Lymphatic: Atherosclerotic plaque within the normal caliber aorta. No suspicious or enlarged lymph nodes in the included lymphatic chains. Reproductive: The prostate and seminal vesicles are unremarkable. Other: No free fluid or free air. No bowel containing hernias. No evidence of retroperitoneal or body wall hematoma. No significant soft tissue contusion though portions of the lateral most flanks are partially collimated from view due to patient size. Musculoskeletal: Superior endplate compression deformity of L2 is unchanged from comparison CT 04/22/2019. Dextrocurvature of the lumbar spine centered at L3. Multilevel degenerative changes are present in the imaged portions of the spine. Features most pronounced at L1-2 and L5-S1. IMPRESSION: 1. New T8 compression deformity with approximately 30% height loss. Worsening anterior compression deformity of the T11 vertebrae now with 20% height loss anteriorly. Additional T12 and L2 compression deformities are stable from prior. 2. No other acute traumatic injury within the chest, abdomen, or pelvis. Specifically, no splenic injury or retroperitoneal hematoma. 3. Mild intra and extrahepatic biliary ductal dilatation with a small calcified gallstone seen in the distal common bile duct just proximal to the ampulla of Vater. Correlate with LFTs and consider further evaluation with MRCP as clinically indicated. 4. Stable sub 5 mm nodules in the right middle and left lower lobe. No further evaluation is required if patient is low risk however should continue with previously recommended 12 month follow-up in September of 2021 if patient is high risk. 5. Severe scoliotic curvature of the thoracolumbar spine with associated chest wall deformity. 6. Duodenal and colonic diverticula without  features of diverticulitis. 7. Moderate bladder distention, correlate for features of outlet obstruction or retention. 8. Aortic Atherosclerosis (ICD10-I70.0). Electronically Signed   By: Lovena Le M.D.   On: 06/03/2019 00:03   Mr 3d Recon At Scanner  Result Date: 06/09/2019 CLINICAL DATA:  Abnormal liver function tests with cholelithiasis. EXAM: MRI ABDOMEN WITHOUT AND WITH CONTRAST (INCLUDING MRCP) TECHNIQUE: Multiplanar multisequence MR imaging of the abdomen was performed both before and after the administration of intravenous contrast. Heavily T2-weighted images of the biliary and pancreatic ducts were obtained, and three-dimensional MRCP images were rendered by post processing. CONTRAST:  44mL GADAVIST GADOBUTROL 1 MMOL/ML IV SOLN COMPARISON:  CT abdomen/pelvis 06/02/2019. FINDINGS: Lower chest: The heart is enlarged. Hepatobiliary: 7 mm simple cyst is identified in the anterior liver no suspicious enhancing liver abnormality. Liver measures 16.2 cm craniocaudal length, upper normal. Gallbladder is distended no evidence for gallstones by MRI. Mild intrahepatic biliary duct prominence. Common bile duct in the head of the pancreas measures 7 mm diameter which is upper normal for patient age. The common bile duct shows rather abrupt transition at the ampulla, but no definite evidence for choledocholithiasis. Pancreas: No focal mass lesion. Main pancreatic duct measures 3 mm diameter, upper normal. No intraparenchymal cyst. No peripancreatic edema. Spleen: Spleen is 12.4 cm craniocaudal length. No focal abnormality. Adrenals/Urinary Tract: No adrenal nodule or mass. No suspicious enhancing abnormality in either kidney. Tiny cortical T2 hyperintensities in the cortex of the kidneys without definite enhancement suggest tiny cortical cyst. Central sinus cysts are noted in the kidneys bilaterally, left greater than right. Stomach/Bowel: Stomach is unremarkable. No gastric wall thickening. No evidence of outlet  obstruction. Duodenum is normally positioned as is the ligament of Treitz. No small bowel or colonic  dilatation within the visualized abdomen. Vascular/Lymphatic: No abdominal aortic aneurysm. No abdominal lymphadenopathy Other: Fluid-filled structure in the central pelvis is been incompletely visualized but is presumably a markedly dilated urinary bladder. Musculoskeletal: No abnormal marrow enhancement within the visualized bony anatomy IMPRESSION: 1. Mild intra and extrahepatic biliary duct prominence with common bile duct measuring upper normal for patient age. No gallstones evident and no definite choledocholithiasis. Somewhat abrupt transition of the duct into the ampulla noted, but nonspecific. ERCP could be used to further evaluate for ampullary lesion as clinically warranted. 2. Hepatic and renal cysts. 3. Presumed marked distention of the urinary bladder. Electronically Signed   By: Misty Stanley M.D.   On: 06/09/2019 07:58   Dg Humerus Right  Result Date: 06/02/2019 CLINICAL DATA:  Fall, right arm pain EXAM: RIGHT HUMERUS - 2+ VIEW COMPARISON:  None. FINDINGS: There is no evidence of fracture or other focal bone lesions. Soft tissues are unremarkable. IMPRESSION: Negative. Electronically Signed   By: Rolm Baptise M.D.   On: 06/02/2019 22:07   Mr Abdomen Mrcp Moise Boring Contast  Result Date: 06/09/2019 CLINICAL DATA:  Abnormal liver function tests with cholelithiasis. EXAM: MRI ABDOMEN WITHOUT AND WITH CONTRAST (INCLUDING MRCP) TECHNIQUE: Multiplanar multisequence MR imaging of the abdomen was performed both before and after the administration of intravenous contrast. Heavily T2-weighted images of the biliary and pancreatic ducts were obtained, and three-dimensional MRCP images were rendered by post processing. CONTRAST:  67mL GADAVIST GADOBUTROL 1 MMOL/ML IV SOLN COMPARISON:  CT abdomen/pelvis 06/02/2019. FINDINGS: Lower chest: The heart is enlarged. Hepatobiliary: 7 mm simple cyst is identified in the  anterior liver no suspicious enhancing liver abnormality. Liver measures 16.2 cm craniocaudal length, upper normal. Gallbladder is distended no evidence for gallstones by MRI. Mild intrahepatic biliary duct prominence. Common bile duct in the head of the pancreas measures 7 mm diameter which is upper normal for patient age. The common bile duct shows rather abrupt transition at the ampulla, but no definite evidence for choledocholithiasis. Pancreas: No focal mass lesion. Main pancreatic duct measures 3 mm diameter, upper normal. No intraparenchymal cyst. No peripancreatic edema. Spleen: Spleen is 12.4 cm craniocaudal length. No focal abnormality. Adrenals/Urinary Tract: No adrenal nodule or mass. No suspicious enhancing abnormality in either kidney. Tiny cortical T2 hyperintensities in the cortex of the kidneys without definite enhancement suggest tiny cortical cyst. Central sinus cysts are noted in the kidneys bilaterally, left greater than right. Stomach/Bowel: Stomach is unremarkable. No gastric wall thickening. No evidence of outlet obstruction. Duodenum is normally positioned as is the ligament of Treitz. No small bowel or colonic dilatation within the visualized abdomen. Vascular/Lymphatic: No abdominal aortic aneurysm. No abdominal lymphadenopathy Other: Fluid-filled structure in the central pelvis is been incompletely visualized but is presumably a markedly dilated urinary bladder. Musculoskeletal: No abnormal marrow enhancement within the visualized bony anatomy IMPRESSION: 1. Mild intra and extrahepatic biliary duct prominence with common bile duct measuring upper normal for patient age. No gallstones evident and no definite choledocholithiasis. Somewhat abrupt transition of the duct into the ampulla noted, but nonspecific. ERCP could be used to further evaluate for ampullary lesion as clinically warranted. 2. Hepatic and renal cysts. 3. Presumed marked distention of the urinary bladder. Electronically  Signed   By: Misty Stanley M.D.   On: 06/09/2019 07:58   Vas [place] Lower Extremity Venous (dvt)  Result Date: 06/09/2019  Lower Venous Study Indications: Swelling.  Comparison Study: no prior Performing Technologist: Abram Sander RVS  Examination Guidelines: A complete evaluation  includes B-mode imaging, spectral Doppler, color Doppler, and power Doppler as needed of all accessible portions of each vessel. Bilateral testing is considered an integral part of a complete examination. Limited examinations for reoccurring indications may be performed as noted.  +---------+---------------+---------+-----------+----------+--------------+ RIGHT    CompressibilityPhasicitySpontaneityPropertiesThrombus Aging +---------+---------------+---------+-----------+----------+--------------+ CFV      Full           Yes      Yes                                 +---------+---------------+---------+-----------+----------+--------------+ SFJ      Full                                                        +---------+---------------+---------+-----------+----------+--------------+ FV Prox  Full                                                        +---------+---------------+---------+-----------+----------+--------------+ FV Mid   Full                                                        +---------+---------------+---------+-----------+----------+--------------+ FV DistalFull                                                        +---------+---------------+---------+-----------+----------+--------------+ PFV      Full                                                        +---------+---------------+---------+-----------+----------+--------------+ POP      Full           Yes      Yes                                 +---------+---------------+---------+-----------+----------+--------------+ PTV      Full                                                         +---------+---------------+---------+-----------+----------+--------------+ PERO     Full                                                        +---------+---------------+---------+-----------+----------+--------------+   +---------+---------------+---------+-----------+----------+--------------+ LEFT  CompressibilityPhasicitySpontaneityPropertiesThrombus Aging +---------+---------------+---------+-----------+----------+--------------+ CFV      Full           Yes      Yes                                 +---------+---------------+---------+-----------+----------+--------------+ SFJ      Full                                                        +---------+---------------+---------+-----------+----------+--------------+ FV Prox  Full                                                        +---------+---------------+---------+-----------+----------+--------------+ FV Mid   Full                                                        +---------+---------------+---------+-----------+----------+--------------+ FV DistalFull                                                        +---------+---------------+---------+-----------+----------+--------------+ PFV      Full                                                        +---------+---------------+---------+-----------+----------+--------------+ POP      Full           Yes      Yes                                 +---------+---------------+---------+-----------+----------+--------------+ PTV      Full                                                        +---------+---------------+---------+-----------+----------+--------------+ PERO     Full                                                        +---------+---------------+---------+-----------+----------+--------------+     Summary: Right: There is no evidence of deep vein thrombosis in the lower extremity. No cystic structure found in the  popliteal fossa. Left: There is no evidence of deep vein thrombosis in the lower extremity. No cystic structure found in the popliteal fossa.  *See table(s) above for  measurements and observations. Electronically signed by Harold Barban MD on 06/09/2019 at 5:51:57 PM.    Final     All questions were answered. The patient knows to call the clinic with any problems, questions or concerns. No barriers to learning was detected.  I spent 30 minutes counseling the patient face to face. The total time spent in the appointment was 40 minutes and more than 50% was on counseling and review of test results  Heath Lark, MD 06/18/2019 1:54 PM

## 2019-06-18 NOTE — Patient Instructions (Signed)
1) For pain: start new medication (methadone twice a day) on a scheduled basis. Try this for 1 to 2 days. If you tolerate that well without nausea or vomiting, increase to three times a day 2) You may continue on oxycodone as needed 3) Change frequency of Miralax to take twice a day 4) Take sennokot 1 tablet three time a day 5) Try a Dulcolax suppository to help with constipation 6) Reduce prednisone to once a day in the morning with breakfast 7) Add 500 mg extra strength with each dose of oxycodone 8) Add a muscle relaxant (flexeril 3 times a day)

## 2019-06-18 NOTE — Assessment & Plan Note (Signed)
She has achieved complete hematological response with just 2 doses of bendamustine Due to her ongoing uncontrolled pain and other health issues, I will hold off further chemotherapy and focus on supportive care

## 2019-06-18 NOTE — Assessment & Plan Note (Signed)
She has severe constipation secondary to narcotic prescription I recommend schedule MiraLAX twice a day along with Senokot I also recommend a trial of suppository

## 2019-06-18 NOTE — Assessment & Plan Note (Signed)
She has intractable back pain since recent vertebral fracture She has intermittent, severe muscle spasm I will try to consult IR to see if kyphoplasty is appropriate In the meantime, I will continue to manage her pain

## 2019-06-18 NOTE — Assessment & Plan Note (Signed)
She is scheduled with home physical therapy I recommend a brace to use but her physical therapist will have to measure it

## 2019-06-18 NOTE — Assessment & Plan Note (Signed)
She has multifactorial pain, component of muscle spasm related to recent fall/fracture and recent splenomegaly Despite taking regular doses of oxycodone, she is still having a lot of pain I recommend introduction of low-dose methadone for long-acting pain medicine along with oxycodone as needed I also recommend scheduled Tylenol with each dose of oxycodone I also recommend Flexeril as needed for component of muscle spasm I warned her and her husband related to side effects of sedation, constipation and risk of falls with multiple different treatment modality I will assess pain control again next week

## 2019-06-18 NOTE — Telephone Encounter (Signed)
Scheduled appt per 11/19 sch message - unable to reach pt . Left message on mobile vmail.

## 2019-06-22 ENCOUNTER — Telehealth: Payer: Self-pay

## 2019-06-22 NOTE — Telephone Encounter (Signed)
-----   Message from Heath Lark, MD sent at 06/22/2019  8:25 AM EST ----- Regarding: can you call and ask if her pain is better?

## 2019-06-22 NOTE — Telephone Encounter (Signed)
Called and given below message. She verbalized understanding. Her pain is better, not completely gone but better.  Offered e-visit for 11/25 at 11:30. She is agreeable to e-visit. Scheduling message sent to change appt to e-visit.

## 2019-06-23 ENCOUNTER — Telehealth: Payer: Self-pay

## 2019-06-23 NOTE — Telephone Encounter (Signed)
April [name] called requesting some assistance from CNA and she is willing to pay. Called and spoke with [name], NP at [facility] she will reach out to patient. Sent message to Rondel Oh, social worker to see if she will contact [name] also.

## 2019-06-24 ENCOUNTER — Encounter: Payer: Self-pay | Admitting: Hematology and Oncology

## 2019-06-24 ENCOUNTER — Inpatient Hospital Stay (HOSPITAL_BASED_OUTPATIENT_CLINIC_OR_DEPARTMENT_OTHER): Payer: Medicare Other | Admitting: Hematology and Oncology

## 2019-06-24 ENCOUNTER — Telehealth: Payer: Self-pay | Admitting: Internal Medicine

## 2019-06-24 DIAGNOSIS — M549 Dorsalgia, unspecified: Secondary | ICD-10-CM | POA: Diagnosis not present

## 2019-06-24 DIAGNOSIS — C9110 Chronic lymphocytic leukemia of B-cell type not having achieved remission: Secondary | ICD-10-CM

## 2019-06-24 DIAGNOSIS — K5909 Other constipation: Secondary | ICD-10-CM

## 2019-06-24 NOTE — Assessment & Plan Note (Signed)
She has achieved complete hematological response with just 2 doses of bendamustine Due to her ongoing uncontrolled pain and other health issues, I will hold off further chemotherapy and focus on supportive care

## 2019-06-24 NOTE — Progress Notes (Signed)
HEMATOLOGY-ONCOLOGY ELECTRONIC VISIT PROGRESS NOTE  Patient Care Team: Isaac Bliss, Rayford Halsted, MD as PCP - General (Internal Medicine) Jacqualine Mau Aletha Halim, NP as Nurse Practitioner [facility] and Palliative Medicine)  I connected with by [facility] video conference and verified that I am speaking with the correct person using two identifiers.  Due to inability to connect through the video conference, I did a phone visit instead I discussed the limitations, risks, security and privacy concerns of performing an evaluation and management service by [facility] and the availability of in person appointments.  I also discussed with the patient that there may be a patient responsible charge related to this service. The patient expressed understanding and agreed to proceed.   ASSESSMENT & PLAN:  CLL (chronic lymphocytic leukemia) ([place]) She has achieved complete hematological response with just 2 doses of bendamustine Due to her ongoing uncontrolled pain and other health issues, I will hold off further chemotherapy and focus on supportive care  Intractable back pain Previously, we had extensive discussion about pain management She has been referred to see interventional radiologist to consider kyphoplasty and neurosurgery to consider some other intervention to treat her severe pain that stemmed from compression fracture Recently, I introduced the concept of taking long-acting pain medicine She started taking methadone with good success She is sleeping well at night She will continue scheduled methadone She will also continue immediate release oxycodone as needed for breakthrough pain She will also take Flexeril as needed for muscle spasm The patient would like to continue weekly phone visits to manage her pain remotely which I think is reasonable I will call her and do another phone visit again next week for further follow-up We discussed narcotic refill policy  Other constipation Her constipation has  resolved with aggressive laxative therapy I reminded her that she will likely encounter constipation again due to her aggressive pain management I will reassess next week   No orders of the defined types were placed in this encounter.   INTERVAL HISTORY: Please see below for problem oriented charting. I spoke with the patient over the telephone She continues to have significant, persistent back pain However, she is able to sleep through the night with the introduction of methadone as well as Flexeril I verify over the phone that she is taking her medications correctly She has appointment pending to see interventional radiologist and neurosurgery service for management of the compression fractures Constipation has resolved She denies nausea Her appetite is stable  SUMMARY OF ONCOLOGIC HISTORY: Oncology History Overview Note  Del 13 q   CLL (chronic lymphocytic leukemia) ([place])  03/15/2009 Pathology Results   Case #: [id number]  flow cytometry of peripheral blood comfirmed CLL.   04/25/2017 Pathology Results   FISH panel is positive for deletion 13 q only   04/22/2019 Imaging   Ct scan of the chest, abdomen and pelvis 1. No adenopathy within the chest, abdomen or pelvis. 2. Splenomegaly with scattered low-density lesions measuring up to 1.3 cm. 3. Small subpleural nodule in the left lower lobe and right middle lobe measuring up to 5 mm. No follow-up needed if patient is low-risk (and has no known or suspected primary neoplasm). Non-contrast chest CT can be considered in 12 months if patient is high-risk. This recommendation follows the consensus statement: Guidelines for Management of Incidental Pulmonary Nodules Detected on CT Images: From the Fleischner Society 2017; Radiology 2017; 284:[id number]. 4. Aortic Atherosclerosis (ICD10-I70.0). Coronary artery atherosclerotic calcifications.     04/23/2019 Cancer Staging   Staging form:  Chronic Lymphocytic Leukemia / Small Lymphocytic  Lymphoma, AJCC 8th Edition - Clinical stage from 04/23/2019: Modified Rai Stage IV (Modified Rai risk: High, Lymphocytosis: Present, Adenopathy: Absent, Organomegaly: Present, Anemia: Absent, Thrombocytopenia: Present) - Signed by Heath Lark, MD on 04/23/2019   04/28/2019 -  Chemotherapy   The patient had Ibrutinib for chemotherapy treatment.     06/01/2019 -  Chemotherapy   The patient had Bendamustine for chemotherapy   06/02/2019 Imaging   Ct head, chest, abdomen and pelvis 1. New T8 compression deformity with approximately 30% height loss. Worsening anterior compression deformity of the T11 vertebrae now with 20% height loss anteriorly. Additional T12 and L2 compression deformities are stable from prior. 2. No other acute traumatic injury within the chest, abdomen, or pelvis. Specifically, no splenic injury or retroperitoneal hematoma. 3. Mild intra and extrahepatic biliary ductal dilatation with a small calcified gallstone seen in the distal common bile duct just proximal to the ampulla of Vater. Correlate with LFTs and consider further evaluation with MRCP as clinically indicated. 4. Stable sub 5 mm nodules in the right middle and left lower lobe. No further evaluation is required if patient is low risk however should continue with previously recommended 12 month follow-up in September of 2021 if patient is high risk. 5. Severe scoliotic curvature of the thoracolumbar spine with associated chest wall deformity. 6. Duodenal and colonic diverticula without features of diverticulitis. 7. Moderate bladder distention, correlate for features of outlet obstruction or retention. 8. Aortic Atherosclerosis (ICD10-I70.0).   06/07/2019 - 06/10/2019 Hospital Admission   She was admitted for management of LUQ and back pain   06/08/2019 Imaging   MRI abdomen 1. Mild intra and extrahepatic biliary duct prominence with common bile duct measuring upper normal for patient age. No gallstones evident and no  definite choledocholithiasis. Somewhat abrupt transition of the duct into the ampulla noted, but nonspecific. ERCP could be used to further evaluate for ampullary lesion as clinically warranted. 2. Hepatic and renal cysts. 3. Presumed marked distention of the urinary bladder.   06/08/2019 Imaging   Ct angiogram No gross evidence of large or central pulmonary emboli is identified on exam limited secondary to suboptimal pulmonary arterial opacification and respiratory motion.   Bibasilar atelectasis.   Osseous demineralization with compression fractures of several thoracic vertebra.   Aneurysmal dilatation ascending thoracic aorta 4.1 cm transverse, recommendation below.   Recommend annual imaging followup by CTA or MRA.   Aortic Atherosclerosis (ICD10-I70.0).   Aortic aneurysm NOS     REVIEW OF SYSTEMS:   Constitutional: Denies fevers, chills or abnormal weight loss Eyes: Denies blurriness of vision Ears, nose, mouth, throat, and face: Denies mucositis or sore throat Respiratory: Denies cough, dyspnea or wheezes Cardiovascular: Denies palpitation, chest discomfort Gastrointestinal:  Denies nausea, heartburn or change in bowel habits Skin: Denies abnormal skin rashes Lymphatics: Denies new lymphadenopathy or easy bruising Neurological:Denies numbness, tingling or new weaknesses Behavioral/Psych: Mood is stable, no new changes  Extremities: No lower extremity edema All other systems were reviewed with the patient and are negative.  I have reviewed the past medical history, past surgical history, social history and family history with the patient and they are unchanged from previous note.  ALLERGIES:  is allergic to codeine and levofloxacin.  MEDICATIONS:  Current Outpatient Medications  Medication Sig Dispense Refill   acetaminophen (TYLENOL) 500 MG tablet Take 2 tablets (1,000 mg total) by mouth every 8 (eight) hours. 180 tablet 11   allopurinol (ZYLOPRIM) 300 MG tablet Take  1  tablet (300 mg total) by mouth daily. 30 tablet 3   amLODipine (NORVASC) 5 MG tablet Take 1 tablet (5 mg total) by mouth daily. 30 tablet 0   augmented betamethasone dipropionate (DIPROLENE-AF) 0.05 % cream Apply 1 application topically 2 (two) times daily as needed (flare ups).   2   bisacodyl (DULCOLAX) 10 MG suppository Place 1 suppository (10 mg total) rectally as needed for moderate constipation. 12 suppository 0   cyclobenzaprine (FLEXERIL) 5 MG tablet Take 1 tablet (5 mg total) by mouth 3 (three) times daily as needed for muscle spasms. 30 tablet 0   lidocaine (LIDODERM) 5 % Place 1 patch onto the skin every 12 (twelve) hours. Remove & Discard patch within 12 hours or as directed by MD 60 patch 0   methadone (DOLOPHINE) 10 MG tablet Take 1 tablet (10 mg total) by mouth every 8 (eight) hours. 30 tablet 0   oxyCODONE (OXY IR/ROXICODONE) 5 MG immediate release tablet Take 1 tablet (5 mg total) by mouth every 4 (four) hours as needed for severe pain. 60 tablet 0   polyethylene glycol (MIRALAX / GLYCOLAX) 17 g packet Take 17 g 1-3 times a day as needed for constipation 42 each 0   predniSONE (DELTASONE) 20 MG tablet Take 1 tablet (20 mg total) by mouth daily with breakfast. 30 tablet 0   senna-docusate (SENOKOT-S) 8.6-50 MG tablet Take 1 tablet 1-2 times a day as needed for constipation 90 tablet 0   No current facility-administered medications for this visit.     PHYSICAL EXAMINATION: ECOG PERFORMANCE STATUS: 2 - Symptomatic, <50% confined to bed  LABORATORY DATA:  I have reviewed the data as listed CMP Latest Ref Rng & Units 06/18/2019 06/10/2019 06/09/2019  Glucose 70 - 99 mg/dL 135(H) 107(H) 178(H)  BUN 8 - 23 mg/dL 10 14 15   Creatinine 0.44 - 1.00 mg/dL 0.73 0.74 0.73  Sodium 135 - 145 mmol/L 138 136 136  Potassium 3.5 - 5.1 mmol/L 4.0 4.1 4.5  Chloride 98 - 111 mmol/L 102 102 100  CO2 22 - 32 mmol/L 26 27 26   Calcium 8.9 - 10.3 mg/dL 9.3 9.2 9.2  Total Protein 6.5 -  8.1 g/dL 6.5 - -  Total Bilirubin 0.3 - 1.2 mg/dL 1.0 - -  Alkaline Phos 38 - 126 U/L 182(H) - -  AST 15 - 41 U/L 12(L) - -  ALT 0 - 44 U/L 30 - -    Lab Results  Component Value Date   WBC 8.6 06/18/2019   HGB 11.6 (L) 06/18/2019   HCT 34.8 (L) 06/18/2019   MCV 105.8 (H) 06/18/2019   PLT 159 06/18/2019   NEUTROABS 4.7 06/18/2019     RADIOGRAPHIC STUDIES: I have personally reviewed the radiological images as listed and agreed with the findings in the report. Dg Chest 2 View  Result Date: 06/07/2019 CLINICAL DATA:  Chest pain EXAM: CHEST - 2 VIEW COMPARISON:  June 02, 2019 FINDINGS: The cardiomediastinal silhouette is unchanged from prior exam with mild cardiomegaly. There is opacity in the right upper lung which is likely due to overlap of shadows from positioning. The left lung is clear. No pleural effusion or focal airspace consolidation. Again noted is diffuse osteopenia and scoliotic curvature. IMPRESSION: Opacity in the right upper lung which is likely thought to be due to overlap of shadows. If clinical concern remains would recommend repeat radiograph. Electronically Signed   By: Prudencio Pair M.D.   On: 06/07/2019 22:13   Ct Head Wo  Contrast  Result Date: 06/03/2019 CLINICAL DATA:  Fall, headache EXAM: CT HEAD WITHOUT CONTRAST TECHNIQUE: Contiguous axial images were obtained from the base of the skull through the vertex without intravenous contrast. COMPARISON:  MRI 05/04/2013 FINDINGS: Brain: There is atrophy and chronic small vessel disease changes. No acute intracranial abnormality. Specifically, no hemorrhage, hydrocephalus, mass lesion, acute infarction, or significant intracranial injury. Vascular: No hyperdense vessel or unexpected calcification. Skull: No acute calvarial abnormality. Sinuses/Orbits: No acute finding Other: None IMPRESSION: Atrophy, chronic microvascular disease. No acute intracranial abnormality. Electronically Signed   By: Rolm Baptise M.D.   On:  06/03/2019 00:07   Ct Chest W Contrast  Result Date: 06/03/2019 CLINICAL DATA:  Fall from standing, history of CLL, flank pain and tenderness evaluate for splenic injury or retroperitoneal hematoma. Left rib pain. EXAM: CT CHEST, ABDOMEN, AND PELVIS WITH CONTRAST TECHNIQUE: Multidetector CT imaging of the chest, abdomen and pelvis was performed following the standard protocol during bolus administration of intravenous contrast. CONTRAST:  158mL OMNIPAQUE IOHEXOL 300 MG/ML  SOLN COMPARISON:  CT 04/22/2019 FINDINGS: CT CHEST FINDINGS Cardiovascular: The aortic root is suboptimally assessed given cardiac pulsation artifact. No acute luminal abnormality of the thoracic aorta. Minimal atheromatous plaque. No periaortic stranding or hemorrhage. Slightly tortuous course of the thoracic aorta. Normal 3 vessel branching of the arch mild tortuosity of the brachiocephalic vessels. Proximal great vessels are otherwise unremarkable. Normal heart size. No pericardial effusion. Coronary artery calcifications are noted. Central pulmonary arteries are normal caliber without large central filling defects on this non tailored examination. Mediastinum/Nodes: Thyroid gland and thoracic inlet are unremarkable. No mediastinal hematoma or pneumomediastinum. No acute traumatic abnormality of the trachea or esophagus. No mediastinal, hilar or axillary adenopathy. Lungs/Pleura: Mild interlobular septal thickening is noted towards the lung apices and in the lung bases as well. Bandlike areas of opacity in the lower lobes and right middle lobe and lingula likely reflect areas of scarring and/or atelectasis. More dependent atelectasis is seen posteriorly. No effusion. No visible pneumothorax. Few stable sub 5 mm nodules are present in the right middle lobe (5/83, 5/65) and left lower lobe (5/87). Musculoskeletal: Inferior endplate compression deformity of the T8 vertebral body with approximately 30% height loss. Slightly increased anterior  wedging compression deformity of the T11 vertebrae when compared to prior study from 04/22/2019 with 20% height loss anteriorly. Anterior wedging of the T12 vertebrae is unchanged. No other acute vertebral fracture or compression deformity is seen remote posttraumatic deformity of left mid clavicle. Additional remote posttraumatic deformities of the left ninth through eleventh ribs are noted. When compared to no acute displaced rib fractures. No other acute or suspicious osseous lesions. Severe dextrocurvature of the midthoracic spine with compensatory curvature of the lower thoracic spine and associated chest wall deformity. CT ABDOMEN PELVIS FINDINGS Hepatobiliary: No direct hepatic injury or perihepatic hematoma. Subcentimeter hypoattenuating focus along the anterior dome of the liver (4/40) too small to fully characterize on CT imaging but statistically likely benign. No concerning hepatic lesions. Gallbladder is normal. No pericholecystic inflammation or fluid. There is mild intra and extrahepatic biliary ductal dilatation with a small calcified gallstone seen in the distal common bile duct just proximal to the ampulla of Vater (4/60-61). Pancreas: Mild pancreatic atrophy. No pancreatic ductal dilatation or surrounding inflammatory changes. Spleen: No direct splenic injury or perisplenic hematoma. No suspicious splenic lesions. Spleen at the upper limits of normal for size. Adrenals/Urinary Tract: No adrenal hemorrhage or suspicious adrenal lesions. No direct renal injury or perirenal hemorrhage. No  extravasation of contrast is seen on excretory phase delayed imaging. Multiple fluid attenuation parapelvic left renal cysts are similar to comparison CT from September of 2020. Kidneys are otherwise unremarkable, without renal calculi, suspicious lesion, or hydronephrosis. Bladder is moderately distended but otherwise unremarkable. Stomach/Bowel: Distal esophagus and stomach are unremarkable. Air-filled 2 cm  duodenal diverticulum (4/53), slightly more distended than on prior study but without adjacent inflammation. No small bowel dilatation or wall thickening. Cecum is displaced into the midline pelvis the appendix is not well visualized though no pericecal inflammation is seen. There is a moderate stool burden throughout the colon. No colonic dilatation or wall thickening however. Colonic anastomosis noted in the midline upper abdomen. Distal colonic diverticulosis without acute peridiverticular inflammation to suggest diverticulitis. Vascular/Lymphatic: Atherosclerotic plaque within the normal caliber aorta. No suspicious or enlarged lymph nodes in the included lymphatic chains. Reproductive: The prostate and seminal vesicles are unremarkable. Other: No free fluid or free air. No bowel containing hernias. No evidence of retroperitoneal or body wall hematoma. No significant soft tissue contusion though portions of the lateral most flanks are partially collimated from view due to patient size. Musculoskeletal: Superior endplate compression deformity of L2 is unchanged from comparison CT 04/22/2019. Dextrocurvature of the lumbar spine centered at L3. Multilevel degenerative changes are present in the imaged portions of the spine. Features most pronounced at L1-2 and L5-S1. IMPRESSION: 1. New T8 compression deformity with approximately 30% height loss. Worsening anterior compression deformity of the T11 vertebrae now with 20% height loss anteriorly. Additional T12 and L2 compression deformities are stable from prior. 2. No other acute traumatic injury within the chest, abdomen, or pelvis. Specifically, no splenic injury or retroperitoneal hematoma. 3. Mild intra and extrahepatic biliary ductal dilatation with a small calcified gallstone seen in the distal common bile duct just proximal to the ampulla of Vater. Correlate with LFTs and consider further evaluation with MRCP as clinically indicated. 4. Stable sub 5 mm nodules  in the right middle and left lower lobe. No further evaluation is required if patient is low risk however should continue with previously recommended 12 month follow-up in September of 2021 if patient is high risk. 5. Severe scoliotic curvature of the thoracolumbar spine with associated chest wall deformity. 6. Duodenal and colonic diverticula without features of diverticulitis. 7. Moderate bladder distention, correlate for features of outlet obstruction or retention. 8. Aortic Atherosclerosis (ICD10-I70.0). Electronically Signed   By: Lovena Le M.D.   On: 06/03/2019 00:03   Ct Angio Chest Pe W Or Wo Contrast  Result Date: 06/08/2019 CLINICAL DATA:  Elevated D-dimer, pain, history CLL, hypertension EXAM: CT ANGIOGRAPHY CHEST WITH CONTRAST TECHNIQUE: Multidetector CT imaging of the chest was performed using the standard protocol during bolus administration of intravenous contrast. Multiplanar CT image reconstructions and MIPs were obtained to evaluate the vascular anatomy. CONTRAST:  18mL OMNIPAQUE IOHEXOL 350 MG/ML SOLN IV COMPARISON:  CT chest 06/02/2019 FINDINGS: Cardiovascular: Atherosclerotic calcifications aorta, proximal great vessels, and minimally in coronary arteries. Aneurysmal dilatation of ascending thoracic aorta 4.1 cm transverse. No evidence of aortic dissection. Pulmonary arteries are suboptimally opacified as well as limited by respiratory motion. No definite large or central pulmonary emboli are visualized. Unable to exclude small and peripheral emboli by this study. No pericardial effusion. Enlargement of cardiac chambers. Mediastinum/Nodes: Esophagus unremarkable. Base of cervical region normal appearance. Lungs/Pleura: Bibasilar atelectasis in the lower lobes. Remaining lungs grossly clear. No pleural effusion or pneumothorax. Upper Abdomen: Spleen appears prominent in size but incompletely visualized. Remaining  visualized upper abdomen unremarkable Musculoskeletal: Significant  cervicothoracic scoliosis. Compression deformities of several thoracic vertebra are again seen. Review of the MIP images confirms the above findings. IMPRESSION: No gross evidence of large or central pulmonary emboli is identified on exam limited secondary to suboptimal pulmonary arterial opacification and respiratory motion. Bibasilar atelectasis. Osseous demineralization with compression fractures of several thoracic vertebra. Aneurysmal dilatation ascending thoracic aorta 4.1 cm transverse, recommendation below. Recommend annual imaging followup by CTA or MRA. This recommendation follows 2010 ACCF/AHA/AATS/ACR/ASA/SCA/SCAI/SIR/STS/SVM Guidelines for the Diagnosis and Management of Patients with Thoracic Aortic Disease. Circulation. 2010; [id number]. Aortic aneurysm NOS (ICD10-I71.9) Aortic Atherosclerosis (ICD10-I70.0). Aortic aneurysm NOS (ICD10-I71.9).  For Electronically Signed   By: Lavonia Dana M.D.   On: 06/08/2019 17:12   US Abdomen Complete  Result Date: 06/08/2019 CLINICAL DATA:  Abdominal distention. EXAM: ABDOMEN ULTRASOUND COMPLETE COMPARISON:  CT 06/02/2019. FINDINGS: Gallbladder: No gallstones or wall thickening visualized. No sonographic Murphy sign noted by sonographer. Common bile duct: Diameter: 8.5 mm. Intrahepatic biliary ductal dilatation also noted. Distal common bile duct stone suspected on prior CT. Again MRCP may prove useful for further evaluation. Liver: No focal lesion identified. Within normal limits in parenchymal echogenicity. Portal vein is patent on color Doppler imaging with normal direction of blood flow towards the liver. IVC: No abnormality visualized. Pancreas: Visualized portion unremarkable. Spleen: Size and appearance within normal limits. Right Kidney: Length: 8.6 cm. Echogenicity within normal limits. No mass or hydronephrosis visualized. Left Kidney: Length: 9.5 cm. Echogenicity within normal limits. No mass or hydronephrosis visualized. Abdominal aorta: No  aneurysm visualized. Other findings: No ascites. IMPRESSION: 1. Common bile duct is dilated 8.5 mm. Intrahepatic biliary ductal dilatation also noted. Distal common bile duct stone suspected on prior CT of 06/02/2019. Again MRCP may prove useful for further evaluation. 2.  No other abnormality identified.  No ascites. Electronically Signed   By: Marcello Moores  Register   On: 06/08/2019 07:05   Ct Abdomen Pelvis W Contrast  Result Date: 06/03/2019 CLINICAL DATA:  Fall from standing, history of CLL, flank pain and tenderness evaluate for splenic injury or retroperitoneal hematoma. Left rib pain. EXAM: CT CHEST, ABDOMEN, AND PELVIS WITH CONTRAST TECHNIQUE: Multidetector CT imaging of the chest, abdomen and pelvis was performed following the standard protocol during bolus administration of intravenous contrast. CONTRAST:  195mL OMNIPAQUE IOHEXOL 300 MG/ML  SOLN COMPARISON:  CT 04/22/2019 FINDINGS: CT CHEST FINDINGS Cardiovascular: The aortic root is suboptimally assessed given cardiac pulsation artifact. No acute luminal abnormality of the thoracic aorta. Minimal atheromatous plaque. No periaortic stranding or hemorrhage. Slightly tortuous course of the thoracic aorta. Normal 3 vessel branching of the arch mild tortuosity of the brachiocephalic vessels. Proximal great vessels are otherwise unremarkable. Normal heart size. No pericardial effusion. Coronary artery calcifications are noted. Central pulmonary arteries are normal caliber without large central filling defects on this non tailored examination. Mediastinum/Nodes: Thyroid gland and thoracic inlet are unremarkable. No mediastinal hematoma or pneumomediastinum. No acute traumatic abnormality of the trachea or esophagus. No mediastinal, hilar or axillary adenopathy. Lungs/Pleura: Mild interlobular septal thickening is noted towards the lung apices and in the lung bases as well. Bandlike areas of opacity in the lower lobes and right middle lobe and lingula likely  reflect areas of scarring and/or atelectasis. More dependent atelectasis is seen posteriorly. No effusion. No visible pneumothorax. Few stable sub 5 mm nodules are present in the right middle lobe (5/83, 5/65) and left lower lobe (5/87). Musculoskeletal: Inferior endplate compression deformity of the T8 vertebral  body with approximately 30% height loss. Slightly increased anterior wedging compression deformity of the T11 vertebrae when compared to prior study from 04/22/2019 with 20% height loss anteriorly. Anterior wedging of the T12 vertebrae is unchanged. No other acute vertebral fracture or compression deformity is seen remote posttraumatic deformity of left mid clavicle. Additional remote posttraumatic deformities of the left ninth through eleventh ribs are noted. When compared to no acute displaced rib fractures. No other acute or suspicious osseous lesions. Severe dextrocurvature of the midthoracic spine with compensatory curvature of the lower thoracic spine and associated chest wall deformity. CT ABDOMEN PELVIS FINDINGS Hepatobiliary: No direct hepatic injury or perihepatic hematoma. Subcentimeter hypoattenuating focus along the anterior dome of the liver (4/40) too small to fully characterize on CT imaging but statistically likely benign. No concerning hepatic lesions. Gallbladder is normal. No pericholecystic inflammation or fluid. There is mild intra and extrahepatic biliary ductal dilatation with a small calcified gallstone seen in the distal common bile duct just proximal to the ampulla of Vater (4/60-61). Pancreas: Mild pancreatic atrophy. No pancreatic ductal dilatation or surrounding inflammatory changes. Spleen: No direct splenic injury or perisplenic hematoma. No suspicious splenic lesions. Spleen at the upper limits of normal for size. Adrenals/Urinary Tract: No adrenal hemorrhage or suspicious adrenal lesions. No direct renal injury or perirenal hemorrhage. No extravasation of contrast is seen  on excretory phase delayed imaging. Multiple fluid attenuation parapelvic left renal cysts are similar to comparison CT from September of 2020. Kidneys are otherwise unremarkable, without renal calculi, suspicious lesion, or hydronephrosis. Bladder is moderately distended but otherwise unremarkable. Stomach/Bowel: Distal esophagus and stomach are unremarkable. Air-filled 2 cm duodenal diverticulum (4/53), slightly more distended than on prior study but without adjacent inflammation. No small bowel dilatation or wall thickening. Cecum is displaced into the midline pelvis the appendix is not well visualized though no pericecal inflammation is seen. There is a moderate stool burden throughout the colon. No colonic dilatation or wall thickening however. Colonic anastomosis noted in the midline upper abdomen. Distal colonic diverticulosis without acute peridiverticular inflammation to suggest diverticulitis. Vascular/Lymphatic: Atherosclerotic plaque within the normal caliber aorta. No suspicious or enlarged lymph nodes in the included lymphatic chains. Reproductive: The prostate and seminal vesicles are unremarkable. Other: No free fluid or free air. No bowel containing hernias. No evidence of retroperitoneal or body wall hematoma. No significant soft tissue contusion though portions of the lateral most flanks are partially collimated from view due to patient size. Musculoskeletal: Superior endplate compression deformity of L2 is unchanged from comparison CT 04/22/2019. Dextrocurvature of the lumbar spine centered at L3. Multilevel degenerative changes are present in the imaged portions of the spine. Features most pronounced at L1-2 and L5-S1. IMPRESSION: 1. New T8 compression deformity with approximately 30% height loss. Worsening anterior compression deformity of the T11 vertebrae now with 20% height loss anteriorly. Additional T12 and L2 compression deformities are stable from prior. 2. No other acute traumatic  injury within the chest, abdomen, or pelvis. Specifically, no splenic injury or retroperitoneal hematoma. 3. Mild intra and extrahepatic biliary ductal dilatation with a small calcified gallstone seen in the distal common bile duct just proximal to the ampulla of Vater. Correlate with LFTs and consider further evaluation with MRCP as clinically indicated. 4. Stable sub 5 mm nodules in the right middle and left lower lobe. No further evaluation is required if patient is low risk however should continue with previously recommended 12 month follow-up in September of 2021 if patient is high risk. 5. Severe  scoliotic curvature of the thoracolumbar spine with associated chest wall deformity. 6. Duodenal and colonic diverticula without features of diverticulitis. 7. Moderate bladder distention, correlate for features of outlet obstruction or retention. 8. Aortic Atherosclerosis (ICD10-I70.0). Electronically Signed   By: Lovena Le M.D.   On: 06/03/2019 00:03   Mr 3d Recon At Scanner  Result Date: 06/09/2019 CLINICAL DATA:  Abnormal liver function tests with cholelithiasis. EXAM: MRI ABDOMEN WITHOUT AND WITH CONTRAST (INCLUDING MRCP) TECHNIQUE: Multiplanar multisequence MR imaging of the abdomen was performed both before and after the administration of intravenous contrast. Heavily T2-weighted images of the biliary and pancreatic ducts were obtained, and three-dimensional MRCP images were rendered by post processing. CONTRAST:  17mL GADAVIST GADOBUTROL 1 MMOL/ML IV SOLN COMPARISON:  CT abdomen/pelvis 06/02/2019. FINDINGS: Lower chest: The heart is enlarged. Hepatobiliary: 7 mm simple cyst is identified in the anterior liver no suspicious enhancing liver abnormality. Liver measures 16.2 cm craniocaudal length, upper normal. Gallbladder is distended no evidence for gallstones by MRI. Mild intrahepatic biliary duct prominence. Common bile duct in the head of the pancreas measures 7 mm diameter which is upper normal for  patient age. The common bile duct shows rather abrupt transition at the ampulla, but no definite evidence for choledocholithiasis. Pancreas: No focal mass lesion. Main pancreatic duct measures 3 mm diameter, upper normal. No intraparenchymal cyst. No peripancreatic edema. Spleen: Spleen is 12.4 cm craniocaudal length. No focal abnormality. Adrenals/Urinary Tract: No adrenal nodule or mass. No suspicious enhancing abnormality in either kidney. Tiny cortical T2 hyperintensities in the cortex of the kidneys without definite enhancement suggest tiny cortical cyst. Central sinus cysts are noted in the kidneys bilaterally, left greater than right. Stomach/Bowel: Stomach is unremarkable. No gastric wall thickening. No evidence of outlet obstruction. Duodenum is normally positioned as is the ligament of Treitz. No small bowel or colonic dilatation within the visualized abdomen. Vascular/Lymphatic: No abdominal aortic aneurysm. No abdominal lymphadenopathy Other: Fluid-filled structure in the central pelvis is been incompletely visualized but is presumably a markedly dilated urinary bladder. Musculoskeletal: No abnormal marrow enhancement within the visualized bony anatomy IMPRESSION: 1. Mild intra and extrahepatic biliary duct prominence with common bile duct measuring upper normal for patient age. No gallstones evident and no definite choledocholithiasis. Somewhat abrupt transition of the duct into the ampulla noted, but nonspecific. ERCP could be used to further evaluate for ampullary lesion as clinically warranted. 2. Hepatic and renal cysts. 3. Presumed marked distention of the urinary bladder. Electronically Signed   By: Misty Stanley M.D.   On: 06/09/2019 07:58   Dg Humerus Right  Result Date: 06/02/2019 CLINICAL DATA:  Fall, right arm pain EXAM: RIGHT HUMERUS - 2+ VIEW COMPARISON:  None. FINDINGS: There is no evidence of fracture or other focal bone lesions. Soft tissues are unremarkable. IMPRESSION: Negative.  Electronically Signed   By: Rolm Baptise M.D.   On: 06/02/2019 22:07   Mr Abdomen Mrcp Moise Boring Contast  Result Date: 06/09/2019 CLINICAL DATA:  Abnormal liver function tests with cholelithiasis. EXAM: MRI ABDOMEN WITHOUT AND WITH CONTRAST (INCLUDING MRCP) TECHNIQUE: Multiplanar multisequence MR imaging of the abdomen was performed both before and after the administration of intravenous contrast. Heavily T2-weighted images of the biliary and pancreatic ducts were obtained, and three-dimensional MRCP images were rendered by post processing. CONTRAST:  58mL GADAVIST GADOBUTROL 1 MMOL/ML IV SOLN COMPARISON:  CT abdomen/pelvis 06/02/2019. FINDINGS: Lower chest: The heart is enlarged. Hepatobiliary: 7 mm simple cyst is identified in the anterior liver no suspicious enhancing liver  abnormality. Liver measures 16.2 cm craniocaudal length, upper normal. Gallbladder is distended no evidence for gallstones by MRI. Mild intrahepatic biliary duct prominence. Common bile duct in the head of the pancreas measures 7 mm diameter which is upper normal for patient age. The common bile duct shows rather abrupt transition at the ampulla, but no definite evidence for choledocholithiasis. Pancreas: No focal mass lesion. Main pancreatic duct measures 3 mm diameter, upper normal. No intraparenchymal cyst. No peripancreatic edema. Spleen: Spleen is 12.4 cm craniocaudal length. No focal abnormality. Adrenals/Urinary Tract: No adrenal nodule or mass. No suspicious enhancing abnormality in either kidney. Tiny cortical T2 hyperintensities in the cortex of the kidneys without definite enhancement suggest tiny cortical cyst. Central sinus cysts are noted in the kidneys bilaterally, left greater than right. Stomach/Bowel: Stomach is unremarkable. No gastric wall thickening. No evidence of outlet obstruction. Duodenum is normally positioned as is the ligament of Treitz. No small bowel or colonic dilatation within the visualized abdomen.  Vascular/Lymphatic: No abdominal aortic aneurysm. No abdominal lymphadenopathy Other: Fluid-filled structure in the central pelvis is been incompletely visualized but is presumably a markedly dilated urinary bladder. Musculoskeletal: No abnormal marrow enhancement within the visualized bony anatomy IMPRESSION: 1. Mild intra and extrahepatic biliary duct prominence with common bile duct measuring upper normal for patient age. No gallstones evident and no definite choledocholithiasis. Somewhat abrupt transition of the duct into the ampulla noted, but nonspecific. ERCP could be used to further evaluate for ampullary lesion as clinically warranted. 2. Hepatic and renal cysts. 3. Presumed marked distention of the urinary bladder. Electronically Signed   By: Misty Stanley M.D.   On: 06/09/2019 07:58   Vas [place] Lower Extremity Venous (dvt)  Result Date: 06/09/2019  Lower Venous Study Indications: Swelling.  Comparison Study: no prior Performing Technologist: Abram Sander RVS  Examination Guidelines: A complete evaluation includes B-mode imaging, spectral Doppler, color Doppler, and power Doppler as needed of all accessible portions of each vessel. Bilateral testing is considered an integral part of a complete examination. Limited examinations for reoccurring indications may be performed as noted.  +---------+---------------+---------+-----------+----------+--------------+  RIGHT     Compressibility Phasicity Spontaneity Properties Thrombus Aging  +---------+---------------+---------+-----------+----------+--------------+  CFV       Full            Yes       Yes                                    +---------+---------------+---------+-----------+----------+--------------+  SFJ       Full                                                             +---------+---------------+---------+-----------+----------+--------------+  FV Prox   Full                                                              +---------+---------------+---------+-----------+----------+--------------+  FV Mid    Full                                                             +---------+---------------+---------+-----------+----------+--------------+  FV Distal Full                                                             +---------+---------------+---------+-----------+----------+--------------+  PFV       Full                                                             +---------+---------------+---------+-----------+----------+--------------+  POP       Full            Yes       Yes                                    +---------+---------------+---------+-----------+----------+--------------+  PTV       Full                                                             +---------+---------------+---------+-----------+----------+--------------+  PERO      Full                                                             +---------+---------------+---------+-----------+----------+--------------+   +---------+---------------+---------+-----------+----------+--------------+  LEFT      Compressibility Phasicity Spontaneity Properties Thrombus Aging  +---------+---------------+---------+-----------+----------+--------------+  CFV       Full            Yes       Yes                                    +---------+---------------+---------+-----------+----------+--------------+  SFJ       Full                                                             +---------+---------------+---------+-----------+----------+--------------+  FV Prox   Full                                                             +---------+---------------+---------+-----------+----------+--------------+  FV Mid    Full                                                             +---------+---------------+---------+-----------+----------+--------------+  FV Distal Full                                                              +---------+---------------+---------+-----------+----------+--------------+  PFV       Full                                                             +---------+---------------+---------+-----------+----------+--------------+  POP       Full            Yes       Yes                                    +---------+---------------+---------+-----------+----------+--------------+  PTV       Full                                                             +---------+---------------+---------+-----------+----------+--------------+  PERO      Full                                                             +---------+---------------+---------+-----------+----------+--------------+     Summary: Right: There is no evidence of deep vein thrombosis in the lower extremity. No cystic structure found in the popliteal fossa. Left: There is no evidence of deep vein thrombosis in the lower extremity. No cystic structure found in the popliteal fossa.  *See table(s) above for measurements and observations. Electronically signed by Harold Barban MD on 06/09/2019 at 5:51:57 PM.    Final     I discussed the assessment and treatment plan with the patient. The patient was provided an opportunity to ask questions and all were answered. The patient agreed with the plan and demonstrated an understanding of the instructions. The patient was advised to call back or seek an in-person evaluation if the symptoms worsen or if the condition fails to improve as anticipated.    I spent 15 minutes counseling the patient face to face. The total time spent in the appointment was 20 minutes and more than 50% was on counseling and review of test results  Heath Lark, MD 06/24/2019 12:10 PM

## 2019-06-24 NOTE — Assessment & Plan Note (Signed)
Her constipation has resolved with aggressive laxative therapy I reminded her that she will likely encounter constipation again due to her aggressive pain management I will reassess next week

## 2019-06-24 NOTE — Telephone Encounter (Signed)
April [name], from [facility], called stating that pts heart rate was elevated today during his visit. He states he will not be doing anything physical with pt today.    [phone]

## 2019-06-24 NOTE — Telephone Encounter (Signed)
FYI

## 2019-06-24 NOTE — Assessment & Plan Note (Addendum)
Previously, we had extensive discussion about pain management She has been referred to see interventional radiologist to consider kyphoplasty and neurosurgery to consider some other intervention to treat her severe pain that stemmed from compression fracture Recently, I introduced the concept of taking long-acting pain medicine She started taking methadone with good success She is sleeping well at night She will continue scheduled methadone She will also continue immediate release oxycodone as needed for breakthrough pain She will also take Flexeril as needed for muscle spasm The patient would like to continue weekly phone visits to manage her pain remotely which I think is reasonable I will call her and do another phone visit again next week for further follow-up We discussed narcotic refill policy

## 2019-06-29 ENCOUNTER — Telehealth: Payer: Self-pay

## 2019-06-29 NOTE — Telephone Encounter (Signed)
She called and left a message to call her.  Called back. She is having horrible pain. She is taking oxycodone 5 mg every 4 hours for pain. Instructed to take 10 mg of the oxycodone every 4 hours and to call in the am with update. Instructed to go to ER for worsening pain. She verbalized understanding.

## 2019-06-30 ENCOUNTER — Other Ambulatory Visit: Payer: Self-pay

## 2019-06-30 ENCOUNTER — Encounter: Payer: Self-pay | Admitting: Hematology and Oncology

## 2019-06-30 ENCOUNTER — Inpatient Hospital Stay: Payer: Medicare Other | Attending: Hematology and Oncology | Admitting: Hematology and Oncology

## 2019-06-30 DIAGNOSIS — G893 Neoplasm related pain (acute) (chronic): Secondary | ICD-10-CM | POA: Diagnosis not present

## 2019-06-30 DIAGNOSIS — C9110 Chronic lymphocytic leukemia of B-cell type not having achieved remission: Secondary | ICD-10-CM

## 2019-06-30 MED ORDER — METHADONE HCL 10 MG PO TABS: 20.0000 mg | ORAL_TABLET | Freq: Three times a day (TID) | ORAL | 0 refills | Status: DC

## 2019-06-30 MED FILL — METHADONE HCL 10 MG TABLET: 10 | 15 days supply | Qty: 90 | Fill #0

## 2019-06-30 NOTE — Telephone Encounter (Signed)
Called to follow up. She is feeling better this morning. Pain is much better. She took oxycodone 10 mg every 4 to 6 hours thru the night. She would like to do e-visit today at 1130. Scheduling message sent.

## 2019-06-30 NOTE — Telephone Encounter (Signed)
Can you check on her and ask how she is doing? Can she wait until tomorrow for e-visits?

## 2019-06-30 NOTE — Progress Notes (Signed)
HEMATOLOGY-ONCOLOGY ELECTRONIC VISIT PROGRESS NOTE  Patient Care Team: Isaac Bliss, Rayford Halsted, MD as PCP - General (Internal Medicine) Jacqualine Mau Aletha Halim, NP as Nurse Practitioner [facility] and Palliative Medicine)  I connected with by [facility] video conference but due to limited technology, a telephone visit was made instead.  I have reviewed all her medications with the patient  ASSESSMENT & PLAN:  CLL (chronic lymphocytic leukemia) ([place]) She has achieved complete hematological response with just 2 doses of bendamustine Due to her ongoing uncontrolled pain and other health issues, I will hold off further chemotherapy and focus on supportive care  Cancer associated pain The patient contacted [place] with severe pain yesterday We gave her verbal instruction to increase oxycodone to 10 mg as needed and she felt a bit better I have verified over the phone that she is taking all her medications correctly The new instruction from today, moving forward, would be to increase methadone to 20 mg every 8 hours on a scheduled basis and to continue to take oxycodone for breakthrough pain I reinforced the importance of taking laxatives on a scheduled basis while on increased dose of pain medicine I am hopeful, with better control of pain with methadone, she can reduce the need to take breakthrough oxycodone Reminded her about appointment to see interventional radiologist tomorrow to consider kyphoplasty to help with her back pain If such procedure is approved, I am hopeful we can slowly wean her off pain medicine in the future I will also try to reduce her prednisone dose in the future   No orders of the defined types were placed in this encounter.   INTERVAL HISTORY: Please see below for problem oriented charting. Her appointment was moved to today because of uncontrolled pain. She contacted my office yesterday evening stated that she was in severe pain. She is taking methadone 10 mg every 8 hours along  with oxycodone as needed.  She took in the verbal instruction to increase oxycodone to 10 mg every 4 hours and she felt better. This morning, she is able to function with the current prescribed pain medicine She denies constipation No recent nausea She has not needed to take any Flexeril for muscle spasm lately  SUMMARY OF ONCOLOGIC HISTORY: Oncology History Overview Note  Del 13 q   CLL (chronic lymphocytic leukemia) ([place])  03/15/2009 Pathology Results   Case #: [id number]  flow cytometry of peripheral blood comfirmed CLL.   04/25/2017 Pathology Results   FISH panel is positive for deletion 13 q only   04/22/2019 Imaging   Ct scan of the chest, abdomen and pelvis 1. No adenopathy within the chest, abdomen or pelvis. 2. Splenomegaly with scattered low-density lesions measuring up to 1.3 cm. 3. Small subpleural nodule in the left lower lobe and right middle lobe measuring up to 5 mm. No follow-up needed if patient is low-risk (and has no known or suspected primary neoplasm). Non-contrast chest CT can be considered in 12 months if patient is high-risk. This recommendation follows the consensus statement: Guidelines for Management of Incidental Pulmonary Nodules Detected on CT Images: From the Fleischner Society 2017; Radiology 2017; 284:[id number]. 4. Aortic Atherosclerosis (ICD10-I70.0). Coronary artery atherosclerotic calcifications.     04/23/2019 Cancer Staging   Staging form: Chronic Lymphocytic Leukemia / Small Lymphocytic Lymphoma, AJCC 8th Edition - Clinical stage from 04/23/2019: Modified Rai Stage IV (Modified Rai risk: High, Lymphocytosis: Present, Adenopathy: Absent, Organomegaly: Present, Anemia: Absent, Thrombocytopenia: Present) - Signed by Heath Lark, MD on 04/23/2019   04/28/2019 -  Chemotherapy   The patient had Ibrutinib for chemotherapy treatment.     06/01/2019 -  Chemotherapy   The patient had Bendamustine for chemotherapy   06/02/2019 Imaging   Ct head, chest, abdomen and  pelvis 1. New T8 compression deformity with approximately 30% height loss. Worsening anterior compression deformity of the T11 vertebrae now with 20% height loss anteriorly. Additional T12 and L2 compression deformities are stable from prior. 2. No other acute traumatic injury within the chest, abdomen, or pelvis. Specifically, no splenic injury or retroperitoneal hematoma. 3. Mild intra and extrahepatic biliary ductal dilatation with a small calcified gallstone seen in the distal common bile duct just proximal to the ampulla of Vater. Correlate with LFTs and consider further evaluation with MRCP as clinically indicated. 4. Stable sub 5 mm nodules in the right middle and left lower lobe. No further evaluation is required if patient is low risk however should continue with previously recommended 12 month follow-up in September of 2021 if patient is high risk. 5. Severe scoliotic curvature of the thoracolumbar spine with associated chest wall deformity. 6. Duodenal and colonic diverticula without features of diverticulitis. 7. Moderate bladder distention, correlate for features of outlet obstruction or retention. 8. Aortic Atherosclerosis (ICD10-I70.0).   06/07/2019 - 06/10/2019 Hospital Admission   She was admitted for management of LUQ and back pain   06/08/2019 Imaging   MRI abdomen 1. Mild intra and extrahepatic biliary duct prominence with common bile duct measuring upper normal for patient age. No gallstones evident and no definite choledocholithiasis. Somewhat abrupt transition of the duct into the ampulla noted, but nonspecific. ERCP could be used to further evaluate for ampullary lesion as clinically warranted. 2. Hepatic and renal cysts. 3. Presumed marked distention of the urinary bladder.   06/08/2019 Imaging   Ct angiogram No gross evidence of large or central pulmonary emboli is identified on exam limited secondary to suboptimal pulmonary arterial opacification and respiratory  motion.   Bibasilar atelectasis.   Osseous demineralization with compression fractures of several thoracic vertebra.   Aneurysmal dilatation ascending thoracic aorta 4.1 cm transverse, recommendation below.   Recommend annual imaging followup by CTA or MRA.   Aortic Atherosclerosis (ICD10-I70.0).   Aortic aneurysm NOS     REVIEW OF SYSTEMS:   Constitutional: Denies fevers, chills or abnormal weight loss Eyes: Denies blurriness of vision Ears, nose, mouth, throat, and face: Denies mucositis or sore throat Respiratory: Denies cough, dyspnea or wheezes Cardiovascular: Denies palpitation, chest discomfort Gastrointestinal:  Denies nausea, heartburn or change in bowel habits Skin: Denies abnormal skin rashes Lymphatics: Denies new lymphadenopathy or easy bruising Neurological:Denies numbness, tingling or new weaknesses Behavioral/Psych: Mood is stable, no new changes  Extremities: No lower extremity edema All other systems were reviewed with the patient and are negative.  I have reviewed the past medical history, past surgical history, social history and family history with the patient and they are unchanged from previous note.  ALLERGIES:  is allergic to codeine and levofloxacin.  MEDICATIONS:  Current Outpatient Medications  Medication Sig Dispense Refill   acetaminophen (TYLENOL) 500 MG tablet Take 2 tablets (1,000 mg total) by mouth every 8 (eight) hours. 180 tablet 11   amLODipine (NORVASC) 5 MG tablet Take 1 tablet (5 mg total) by mouth daily. 30 tablet 0   augmented betamethasone dipropionate (DIPROLENE-AF) 0.05 % cream Apply 1 application topically 2 (two) times daily as needed (flare ups).   2   bisacodyl (DULCOLAX) 10 MG suppository Place 1 suppository (10 mg total)  rectally as needed for moderate constipation. 12 suppository 0   cyclobenzaprine (FLEXERIL) 5 MG tablet Take 1 tablet (5 mg total) by mouth 3 (three) times daily as needed for muscle spasms. 30 tablet 0    lidocaine (LIDODERM) 5 % Place 1 patch onto the skin every 12 (twelve) hours. Remove & Discard patch within 12 hours or as directed by MD 60 patch 0   methadone (DOLOPHINE) 10 MG tablet Take 2 tablets (20 mg total) by mouth every 8 (eight) hours. 90 tablet 0   oxyCODONE (OXY IR/ROXICODONE) 5 MG immediate release tablet Take 1 tablet (5 mg total) by mouth every 4 (four) hours as needed for severe pain. 60 tablet 0   polyethylene glycol (MIRALAX / GLYCOLAX) 17 g packet Take 17 g 1-3 times a day as needed for constipation 42 each 0   predniSONE (DELTASONE) 20 MG tablet Take 1 tablet (20 mg total) by mouth daily with breakfast. 30 tablet 0   senna-docusate (SENOKOT-S) 8.6-50 MG tablet Take 1 tablet 1-2 times a day as needed for constipation 90 tablet 0   No current facility-administered medications for this visit.     PHYSICAL EXAMINATION: ECOG PERFORMANCE STATUS: 2 - Symptomatic, <50% confined to bed  LABORATORY DATA:  I have reviewed the data as listed CMP Latest Ref Rng & Units 06/18/2019 06/10/2019 06/09/2019  Glucose 70 - 99 mg/dL 135(H) 107(H) 178(H)  BUN 8 - 23 mg/dL 10 14 15   Creatinine 0.44 - 1.00 mg/dL 0.73 0.74 0.73  Sodium 135 - 145 mmol/L 138 136 136  Potassium 3.5 - 5.1 mmol/L 4.0 4.1 4.5  Chloride 98 - 111 mmol/L 102 102 100  CO2 22 - 32 mmol/L 26 27 26   Calcium 8.9 - 10.3 mg/dL 9.3 9.2 9.2  Total Protein 6.5 - 8.1 g/dL 6.5 - -  Total Bilirubin 0.3 - 1.2 mg/dL 1.0 - -  Alkaline Phos 38 - 126 U/L 182(H) - -  AST 15 - 41 U/L 12(L) - -  ALT 0 - 44 U/L 30 - -    Lab Results  Component Value Date   WBC 8.6 06/18/2019   HGB 11.6 (L) 06/18/2019   HCT 34.8 (L) 06/18/2019   MCV 105.8 (H) 06/18/2019   PLT 159 06/18/2019   NEUTROABS 4.7 06/18/2019     RADIOGRAPHIC STUDIES: I have personally reviewed the radiological images as listed and agreed with the findings in the report. Dg Chest 2 View  Result Date: 06/07/2019 CLINICAL DATA:  Chest pain EXAM: CHEST - 2  VIEW COMPARISON:  June 02, 2019 FINDINGS: The cardiomediastinal silhouette is unchanged from prior exam with mild cardiomegaly. There is opacity in the right upper lung which is likely due to overlap of shadows from positioning. The left lung is clear. No pleural effusion or focal airspace consolidation. Again noted is diffuse osteopenia and scoliotic curvature. IMPRESSION: Opacity in the right upper lung which is likely thought to be due to overlap of shadows. If clinical concern remains would recommend repeat radiograph. Electronically Signed   By: Prudencio Pair M.D.   On: 06/07/2019 22:13   Ct Head Wo Contrast  Result Date: 06/03/2019 CLINICAL DATA:  Fall, headache EXAM: CT HEAD WITHOUT CONTRAST TECHNIQUE: Contiguous axial images were obtained from the base of the skull through the vertex without intravenous contrast. COMPARISON:  MRI 05/04/2013 FINDINGS: Brain: There is atrophy and chronic small vessel disease changes. No acute intracranial abnormality. Specifically, no hemorrhage, hydrocephalus, mass lesion, acute infarction, or significant intracranial injury. Vascular:  No hyperdense vessel or unexpected calcification. Skull: No acute calvarial abnormality. Sinuses/Orbits: No acute finding Other: None IMPRESSION: Atrophy, chronic microvascular disease. No acute intracranial abnormality. Electronically Signed   By: Rolm Baptise M.D.   On: 06/03/2019 00:07   Ct Chest W Contrast  Result Date: 06/03/2019 CLINICAL DATA:  Fall from standing, history of CLL, flank pain and tenderness evaluate for splenic injury or retroperitoneal hematoma. Left rib pain. EXAM: CT CHEST, ABDOMEN, AND PELVIS WITH CONTRAST TECHNIQUE: Multidetector CT imaging of the chest, abdomen and pelvis was performed following the standard protocol during bolus administration of intravenous contrast. CONTRAST:  183mL OMNIPAQUE IOHEXOL 300 MG/ML  SOLN COMPARISON:  CT 04/22/2019 FINDINGS: CT CHEST FINDINGS Cardiovascular: The aortic root is  suboptimally assessed given cardiac pulsation artifact. No acute luminal abnormality of the thoracic aorta. Minimal atheromatous plaque. No periaortic stranding or hemorrhage. Slightly tortuous course of the thoracic aorta. Normal 3 vessel branching of the arch mild tortuosity of the brachiocephalic vessels. Proximal great vessels are otherwise unremarkable. Normal heart size. No pericardial effusion. Coronary artery calcifications are noted. Central pulmonary arteries are normal caliber without large central filling defects on this non tailored examination. Mediastinum/Nodes: Thyroid gland and thoracic inlet are unremarkable. No mediastinal hematoma or pneumomediastinum. No acute traumatic abnormality of the trachea or esophagus. No mediastinal, hilar or axillary adenopathy. Lungs/Pleura: Mild interlobular septal thickening is noted towards the lung apices and in the lung bases as well. Bandlike areas of opacity in the lower lobes and right middle lobe and lingula likely reflect areas of scarring and/or atelectasis. More dependent atelectasis is seen posteriorly. No effusion. No visible pneumothorax. Few stable sub 5 mm nodules are present in the right middle lobe (5/83, 5/65) and left lower lobe (5/87). Musculoskeletal: Inferior endplate compression deformity of the T8 vertebral body with approximately 30% height loss. Slightly increased anterior wedging compression deformity of the T11 vertebrae when compared to prior study from 04/22/2019 with 20% height loss anteriorly. Anterior wedging of the T12 vertebrae is unchanged. No other acute vertebral fracture or compression deformity is seen remote posttraumatic deformity of left mid clavicle. Additional remote posttraumatic deformities of the left ninth through eleventh ribs are noted. When compared to no acute displaced rib fractures. No other acute or suspicious osseous lesions. Severe dextrocurvature of the midthoracic spine with compensatory curvature of the  lower thoracic spine and associated chest wall deformity. CT ABDOMEN PELVIS FINDINGS Hepatobiliary: No direct hepatic injury or perihepatic hematoma. Subcentimeter hypoattenuating focus along the anterior dome of the liver (4/40) too small to fully characterize on CT imaging but statistically likely benign. No concerning hepatic lesions. Gallbladder is normal. No pericholecystic inflammation or fluid. There is mild intra and extrahepatic biliary ductal dilatation with a small calcified gallstone seen in the distal common bile duct just proximal to the ampulla of Vater (4/60-61). Pancreas: Mild pancreatic atrophy. No pancreatic ductal dilatation or surrounding inflammatory changes. Spleen: No direct splenic injury or perisplenic hematoma. No suspicious splenic lesions. Spleen at the upper limits of normal for size. Adrenals/Urinary Tract: No adrenal hemorrhage or suspicious adrenal lesions. No direct renal injury or perirenal hemorrhage. No extravasation of contrast is seen on excretory phase delayed imaging. Multiple fluid attenuation parapelvic left renal cysts are similar to comparison CT from September of 2020. Kidneys are otherwise unremarkable, without renal calculi, suspicious lesion, or hydronephrosis. Bladder is moderately distended but otherwise unremarkable. Stomach/Bowel: Distal esophagus and stomach are unremarkable. Air-filled 2 cm duodenal diverticulum (4/53), slightly more distended than on prior study but  without adjacent inflammation. No small bowel dilatation or wall thickening. Cecum is displaced into the midline pelvis the appendix is not well visualized though no pericecal inflammation is seen. There is a moderate stool burden throughout the colon. No colonic dilatation or wall thickening however. Colonic anastomosis noted in the midline upper abdomen. Distal colonic diverticulosis without acute peridiverticular inflammation to suggest diverticulitis. Vascular/Lymphatic: Atherosclerotic plaque  within the normal caliber aorta. No suspicious or enlarged lymph nodes in the included lymphatic chains. Reproductive: The prostate and seminal vesicles are unremarkable. Other: No free fluid or free air. No bowel containing hernias. No evidence of retroperitoneal or body wall hematoma. No significant soft tissue contusion though portions of the lateral most flanks are partially collimated from view due to patient size. Musculoskeletal: Superior endplate compression deformity of L2 is unchanged from comparison CT 04/22/2019. Dextrocurvature of the lumbar spine centered at L3. Multilevel degenerative changes are present in the imaged portions of the spine. Features most pronounced at L1-2 and L5-S1. IMPRESSION: 1. New T8 compression deformity with approximately 30% height loss. Worsening anterior compression deformity of the T11 vertebrae now with 20% height loss anteriorly. Additional T12 and L2 compression deformities are stable from prior. 2. No other acute traumatic injury within the chest, abdomen, or pelvis. Specifically, no splenic injury or retroperitoneal hematoma. 3. Mild intra and extrahepatic biliary ductal dilatation with a small calcified gallstone seen in the distal common bile duct just proximal to the ampulla of Vater. Correlate with LFTs and consider further evaluation with MRCP as clinically indicated. 4. Stable sub 5 mm nodules in the right middle and left lower lobe. No further evaluation is required if patient is low risk however should continue with previously recommended 12 month follow-up in September of 2021 if patient is high risk. 5. Severe scoliotic curvature of the thoracolumbar spine with associated chest wall deformity. 6. Duodenal and colonic diverticula without features of diverticulitis. 7. Moderate bladder distention, correlate for features of outlet obstruction or retention. 8. Aortic Atherosclerosis (ICD10-I70.0). Electronically Signed   By: Lovena Le M.D.   On: 06/03/2019  00:03   Ct Angio Chest Pe W Or Wo Contrast  Result Date: 06/08/2019 CLINICAL DATA:  Elevated D-dimer, pain, history CLL, hypertension EXAM: CT ANGIOGRAPHY CHEST WITH CONTRAST TECHNIQUE: Multidetector CT imaging of the chest was performed using the standard protocol during bolus administration of intravenous contrast. Multiplanar CT image reconstructions and MIPs were obtained to evaluate the vascular anatomy. CONTRAST:  131mL OMNIPAQUE IOHEXOL 350 MG/ML SOLN IV COMPARISON:  CT chest 06/02/2019 FINDINGS: Cardiovascular: Atherosclerotic calcifications aorta, proximal great vessels, and minimally in coronary arteries. Aneurysmal dilatation of ascending thoracic aorta 4.1 cm transverse. No evidence of aortic dissection. Pulmonary arteries are suboptimally opacified as well as limited by respiratory motion. No definite large or central pulmonary emboli are visualized. Unable to exclude small and peripheral emboli by this study. No pericardial effusion. Enlargement of cardiac chambers. Mediastinum/Nodes: Esophagus unremarkable. Base of cervical region normal appearance. Lungs/Pleura: Bibasilar atelectasis in the lower lobes. Remaining lungs grossly clear. No pleural effusion or pneumothorax. Upper Abdomen: Spleen appears prominent in size but incompletely visualized. Remaining visualized upper abdomen unremarkable Musculoskeletal: Significant cervicothoracic scoliosis. Compression deformities of several thoracic vertebra are again seen. Review of the MIP images confirms the above findings. IMPRESSION: No gross evidence of large or central pulmonary emboli is identified on exam limited secondary to suboptimal pulmonary arterial opacification and respiratory motion. Bibasilar atelectasis. Osseous demineralization with compression fractures of several thoracic vertebra. Aneurysmal dilatation ascending thoracic  aorta 4.1 cm transverse, recommendation below. Recommend annual imaging followup by CTA or MRA. This  recommendation follows 2010 ACCF/AHA/AATS/ACR/ASA/SCA/SCAI/SIR/STS/SVM Guidelines for the Diagnosis and Management of Patients with Thoracic Aortic Disease. Circulation. 2010; [id number]. Aortic aneurysm NOS (ICD10-I71.9) Aortic Atherosclerosis (ICD10-I70.0). Aortic aneurysm NOS (ICD10-I71.9).  For Electronically Signed   By: Lavonia Dana M.D.   On: 06/08/2019 17:12   US Abdomen Complete  Result Date: 06/08/2019 CLINICAL DATA:  Abdominal distention. EXAM: ABDOMEN ULTRASOUND COMPLETE COMPARISON:  CT 06/02/2019. FINDINGS: Gallbladder: No gallstones or wall thickening visualized. No sonographic Murphy sign noted by sonographer. Common bile duct: Diameter: 8.5 mm. Intrahepatic biliary ductal dilatation also noted. Distal common bile duct stone suspected on prior CT. Again MRCP may prove useful for further evaluation. Liver: No focal lesion identified. Within normal limits in parenchymal echogenicity. Portal vein is patent on color Doppler imaging with normal direction of blood flow towards the liver. IVC: No abnormality visualized. Pancreas: Visualized portion unremarkable. Spleen: Size and appearance within normal limits. Right Kidney: Length: 8.6 cm. Echogenicity within normal limits. No mass or hydronephrosis visualized. Left Kidney: Length: 9.5 cm. Echogenicity within normal limits. No mass or hydronephrosis visualized. Abdominal aorta: No aneurysm visualized. Other findings: No ascites. IMPRESSION: 1. Common bile duct is dilated 8.5 mm. Intrahepatic biliary ductal dilatation also noted. Distal common bile duct stone suspected on prior CT of 06/02/2019. Again MRCP may prove useful for further evaluation. 2.  No other abnormality identified.  No ascites. Electronically Signed   By: Marcello Moores  Register   On: 06/08/2019 07:05   Ct Abdomen Pelvis W Contrast  Result Date: 06/03/2019 CLINICAL DATA:  Fall from standing, history of CLL, flank pain and tenderness evaluate for splenic injury or retroperitoneal  hematoma. Left rib pain. EXAM: CT CHEST, ABDOMEN, AND PELVIS WITH CONTRAST TECHNIQUE: Multidetector CT imaging of the chest, abdomen and pelvis was performed following the standard protocol during bolus administration of intravenous contrast. CONTRAST:  156mL OMNIPAQUE IOHEXOL 300 MG/ML  SOLN COMPARISON:  CT 04/22/2019 FINDINGS: CT CHEST FINDINGS Cardiovascular: The aortic root is suboptimally assessed given cardiac pulsation artifact. No acute luminal abnormality of the thoracic aorta. Minimal atheromatous plaque. No periaortic stranding or hemorrhage. Slightly tortuous course of the thoracic aorta. Normal 3 vessel branching of the arch mild tortuosity of the brachiocephalic vessels. Proximal great vessels are otherwise unremarkable. Normal heart size. No pericardial effusion. Coronary artery calcifications are noted. Central pulmonary arteries are normal caliber without large central filling defects on this non tailored examination. Mediastinum/Nodes: Thyroid gland and thoracic inlet are unremarkable. No mediastinal hematoma or pneumomediastinum. No acute traumatic abnormality of the trachea or esophagus. No mediastinal, hilar or axillary adenopathy. Lungs/Pleura: Mild interlobular septal thickening is noted towards the lung apices and in the lung bases as well. Bandlike areas of opacity in the lower lobes and right middle lobe and lingula likely reflect areas of scarring and/or atelectasis. More dependent atelectasis is seen posteriorly. No effusion. No visible pneumothorax. Few stable sub 5 mm nodules are present in the right middle lobe (5/83, 5/65) and left lower lobe (5/87). Musculoskeletal: Inferior endplate compression deformity of the T8 vertebral body with approximately 30% height loss. Slightly increased anterior wedging compression deformity of the T11 vertebrae when compared to prior study from 04/22/2019 with 20% height loss anteriorly. Anterior wedging of the T12 vertebrae is unchanged. No other  acute vertebral fracture or compression deformity is seen remote posttraumatic deformity of left mid clavicle. Additional remote posttraumatic deformities of the left ninth through eleventh ribs are  noted. When compared to no acute displaced rib fractures. No other acute or suspicious osseous lesions. Severe dextrocurvature of the midthoracic spine with compensatory curvature of the lower thoracic spine and associated chest wall deformity. CT ABDOMEN PELVIS FINDINGS Hepatobiliary: No direct hepatic injury or perihepatic hematoma. Subcentimeter hypoattenuating focus along the anterior dome of the liver (4/40) too small to fully characterize on CT imaging but statistically likely benign. No concerning hepatic lesions. Gallbladder is normal. No pericholecystic inflammation or fluid. There is mild intra and extrahepatic biliary ductal dilatation with a small calcified gallstone seen in the distal common bile duct just proximal to the ampulla of Vater (4/60-61). Pancreas: Mild pancreatic atrophy. No pancreatic ductal dilatation or surrounding inflammatory changes. Spleen: No direct splenic injury or perisplenic hematoma. No suspicious splenic lesions. Spleen at the upper limits of normal for size. Adrenals/Urinary Tract: No adrenal hemorrhage or suspicious adrenal lesions. No direct renal injury or perirenal hemorrhage. No extravasation of contrast is seen on excretory phase delayed imaging. Multiple fluid attenuation parapelvic left renal cysts are similar to comparison CT from September of 2020. Kidneys are otherwise unremarkable, without renal calculi, suspicious lesion, or hydronephrosis. Bladder is moderately distended but otherwise unremarkable. Stomach/Bowel: Distal esophagus and stomach are unremarkable. Air-filled 2 cm duodenal diverticulum (4/53), slightly more distended than on prior study but without adjacent inflammation. No small bowel dilatation or wall thickening. Cecum is displaced into the midline  pelvis the appendix is not well visualized though no pericecal inflammation is seen. There is a moderate stool burden throughout the colon. No colonic dilatation or wall thickening however. Colonic anastomosis noted in the midline upper abdomen. Distal colonic diverticulosis without acute peridiverticular inflammation to suggest diverticulitis. Vascular/Lymphatic: Atherosclerotic plaque within the normal caliber aorta. No suspicious or enlarged lymph nodes in the included lymphatic chains. Reproductive: The prostate and seminal vesicles are unremarkable. Other: No free fluid or free air. No bowel containing hernias. No evidence of retroperitoneal or body wall hematoma. No significant soft tissue contusion though portions of the lateral most flanks are partially collimated from view due to patient size. Musculoskeletal: Superior endplate compression deformity of L2 is unchanged from comparison CT 04/22/2019. Dextrocurvature of the lumbar spine centered at L3. Multilevel degenerative changes are present in the imaged portions of the spine. Features most pronounced at L1-2 and L5-S1. IMPRESSION: 1. New T8 compression deformity with approximately 30% height loss. Worsening anterior compression deformity of the T11 vertebrae now with 20% height loss anteriorly. Additional T12 and L2 compression deformities are stable from prior. 2. No other acute traumatic injury within the chest, abdomen, or pelvis. Specifically, no splenic injury or retroperitoneal hematoma. 3. Mild intra and extrahepatic biliary ductal dilatation with a small calcified gallstone seen in the distal common bile duct just proximal to the ampulla of Vater. Correlate with LFTs and consider further evaluation with MRCP as clinically indicated. 4. Stable sub 5 mm nodules in the right middle and left lower lobe. No further evaluation is required if patient is low risk however should continue with previously recommended 12 month follow-up in September of 2021  if patient is high risk. 5. Severe scoliotic curvature of the thoracolumbar spine with associated chest wall deformity. 6. Duodenal and colonic diverticula without features of diverticulitis. 7. Moderate bladder distention, correlate for features of outlet obstruction or retention. 8. Aortic Atherosclerosis (ICD10-I70.0). Electronically Signed   By: Lovena Le M.D.   On: 06/03/2019 00:03   Mr 3d Recon At Scanner  Result Date: 06/09/2019 CLINICAL DATA:  Abnormal  liver function tests with cholelithiasis. EXAM: MRI ABDOMEN WITHOUT AND WITH CONTRAST (INCLUDING MRCP) TECHNIQUE: Multiplanar multisequence MR imaging of the abdomen was performed both before and after the administration of intravenous contrast. Heavily T2-weighted images of the biliary and pancreatic ducts were obtained, and three-dimensional MRCP images were rendered by post processing. CONTRAST:  21mL GADAVIST GADOBUTROL 1 MMOL/ML IV SOLN COMPARISON:  CT abdomen/pelvis 06/02/2019. FINDINGS: Lower chest: The heart is enlarged. Hepatobiliary: 7 mm simple cyst is identified in the anterior liver no suspicious enhancing liver abnormality. Liver measures 16.2 cm craniocaudal length, upper normal. Gallbladder is distended no evidence for gallstones by MRI. Mild intrahepatic biliary duct prominence. Common bile duct in the head of the pancreas measures 7 mm diameter which is upper normal for patient age. The common bile duct shows rather abrupt transition at the ampulla, but no definite evidence for choledocholithiasis. Pancreas: No focal mass lesion. Main pancreatic duct measures 3 mm diameter, upper normal. No intraparenchymal cyst. No peripancreatic edema. Spleen: Spleen is 12.4 cm craniocaudal length. No focal abnormality. Adrenals/Urinary Tract: No adrenal nodule or mass. No suspicious enhancing abnormality in either kidney. Tiny cortical T2 hyperintensities in the cortex of the kidneys without definite enhancement suggest tiny cortical cyst. Central  sinus cysts are noted in the kidneys bilaterally, left greater than right. Stomach/Bowel: Stomach is unremarkable. No gastric wall thickening. No evidence of outlet obstruction. Duodenum is normally positioned as is the ligament of Treitz. No small bowel or colonic dilatation within the visualized abdomen. Vascular/Lymphatic: No abdominal aortic aneurysm. No abdominal lymphadenopathy Other: Fluid-filled structure in the central pelvis is been incompletely visualized but is presumably a markedly dilated urinary bladder. Musculoskeletal: No abnormal marrow enhancement within the visualized bony anatomy IMPRESSION: 1. Mild intra and extrahepatic biliary duct prominence with common bile duct measuring upper normal for patient age. No gallstones evident and no definite choledocholithiasis. Somewhat abrupt transition of the duct into the ampulla noted, but nonspecific. ERCP could be used to further evaluate for ampullary lesion as clinically warranted. 2. Hepatic and renal cysts. 3. Presumed marked distention of the urinary bladder. Electronically Signed   By: Misty Stanley M.D.   On: 06/09/2019 07:58   Dg Humerus Right  Result Date: 06/02/2019 CLINICAL DATA:  Fall, right arm pain EXAM: RIGHT HUMERUS - 2+ VIEW COMPARISON:  None. FINDINGS: There is no evidence of fracture or other focal bone lesions. Soft tissues are unremarkable. IMPRESSION: Negative. Electronically Signed   By: Rolm Baptise M.D.   On: 06/02/2019 22:07   Mr Abdomen Mrcp Moise Boring Contast  Result Date: 06/09/2019 CLINICAL DATA:  Abnormal liver function tests with cholelithiasis. EXAM: MRI ABDOMEN WITHOUT AND WITH CONTRAST (INCLUDING MRCP) TECHNIQUE: Multiplanar multisequence MR imaging of the abdomen was performed both before and after the administration of intravenous contrast. Heavily T2-weighted images of the biliary and pancreatic ducts were obtained, and three-dimensional MRCP images were rendered by post processing. CONTRAST:  74mL GADAVIST  GADOBUTROL 1 MMOL/ML IV SOLN COMPARISON:  CT abdomen/pelvis 06/02/2019. FINDINGS: Lower chest: The heart is enlarged. Hepatobiliary: 7 mm simple cyst is identified in the anterior liver no suspicious enhancing liver abnormality. Liver measures 16.2 cm craniocaudal length, upper normal. Gallbladder is distended no evidence for gallstones by MRI. Mild intrahepatic biliary duct prominence. Common bile duct in the head of the pancreas measures 7 mm diameter which is upper normal for patient age. The common bile duct shows rather abrupt transition at the ampulla, but no definite evidence for choledocholithiasis. Pancreas: No focal mass lesion.  Main pancreatic duct measures 3 mm diameter, upper normal. No intraparenchymal cyst. No peripancreatic edema. Spleen: Spleen is 12.4 cm craniocaudal length. No focal abnormality. Adrenals/Urinary Tract: No adrenal nodule or mass. No suspicious enhancing abnormality in either kidney. Tiny cortical T2 hyperintensities in the cortex of the kidneys without definite enhancement suggest tiny cortical cyst. Central sinus cysts are noted in the kidneys bilaterally, left greater than right. Stomach/Bowel: Stomach is unremarkable. No gastric wall thickening. No evidence of outlet obstruction. Duodenum is normally positioned as is the ligament of Treitz. No small bowel or colonic dilatation within the visualized abdomen. Vascular/Lymphatic: No abdominal aortic aneurysm. No abdominal lymphadenopathy Other: Fluid-filled structure in the central pelvis is been incompletely visualized but is presumably a markedly dilated urinary bladder. Musculoskeletal: No abnormal marrow enhancement within the visualized bony anatomy IMPRESSION: 1. Mild intra and extrahepatic biliary duct prominence with common bile duct measuring upper normal for patient age. No gallstones evident and no definite choledocholithiasis. Somewhat abrupt transition of the duct into the ampulla noted, but nonspecific. ERCP could be  used to further evaluate for ampullary lesion as clinically warranted. 2. Hepatic and renal cysts. 3. Presumed marked distention of the urinary bladder. Electronically Signed   By: Misty Stanley M.D.   On: 06/09/2019 07:58   Vas [place] Lower Extremity Venous (dvt)  Result Date: 06/09/2019  Lower Venous Study Indications: Swelling.  Comparison Study: no prior Performing Technologist: Abram Sander RVS  Examination Guidelines: A complete evaluation includes B-mode imaging, spectral Doppler, color Doppler, and power Doppler as needed of all accessible portions of each vessel. Bilateral testing is considered an integral part of a complete examination. Limited examinations for reoccurring indications may be performed as noted.  +---------+---------------+---------+-----------+----------+--------------+  RIGHT     Compressibility Phasicity Spontaneity Properties Thrombus Aging  +---------+---------------+---------+-----------+----------+--------------+  CFV       Full            Yes       Yes                                    +---------+---------------+---------+-----------+----------+--------------+  SFJ       Full                                                             +---------+---------------+---------+-----------+----------+--------------+  FV Prox   Full                                                             +---------+---------------+---------+-----------+----------+--------------+  FV Mid    Full                                                             +---------+---------------+---------+-----------+----------+--------------+  FV Distal Full                                                             +---------+---------------+---------+-----------+----------+--------------+  PFV       Full                                                             +---------+---------------+---------+-----------+----------+--------------+  POP       Full            Yes       Yes                                     +---------+---------------+---------+-----------+----------+--------------+  PTV       Full                                                             +---------+---------------+---------+-----------+----------+--------------+  PERO      Full                                                             +---------+---------------+---------+-----------+----------+--------------+   +---------+---------------+---------+-----------+----------+--------------+  LEFT      Compressibility Phasicity Spontaneity Properties Thrombus Aging  +---------+---------------+---------+-----------+----------+--------------+  CFV       Full            Yes       Yes                                    +---------+---------------+---------+-----------+----------+--------------+  SFJ       Full                                                             +---------+---------------+---------+-----------+----------+--------------+  FV Prox   Full                                                             +---------+---------------+---------+-----------+----------+--------------+  FV Mid    Full                                                             +---------+---------------+---------+-----------+----------+--------------+  FV Distal Full                                                             +---------+---------------+---------+-----------+----------+--------------+  PFV       Full                                                             +---------+---------------+---------+-----------+----------+--------------+  POP       Full            Yes       Yes                                    +---------+---------------+---------+-----------+----------+--------------+  PTV       Full                                                             +---------+---------------+---------+-----------+----------+--------------+  PERO      Full                                                              +---------+---------------+---------+-----------+----------+--------------+     Summary: Right: There is no evidence of deep vein thrombosis in the lower extremity. No cystic structure found in the popliteal fossa. Left: There is no evidence of deep vein thrombosis in the lower extremity. No cystic structure found in the popliteal fossa.  *See table(s) above for measurements and observations. Electronically signed by Harold Barban MD on 06/09/2019 at 5:51:57 PM.    Final     I discussed the assessment and treatment plan with the patient. The patient was provided an opportunity to ask questions and all were answered. The patient agreed with the plan and demonstrated an understanding of the instructions. The patient was advised to call back or seek an in-person evaluation if the symptoms worsen or if the condition fails to improve as anticipated.    I spent 11 minutes counseling the patient. The total time spent in the appointment was 15 minutes and more than 50% was on counseling and review of test results  Heath Lark, MD 06/30/2019 12:01 PM

## 2019-06-30 NOTE — Assessment & Plan Note (Signed)
The patient contacted [place] with severe pain yesterday We gave her verbal instruction to increase oxycodone to 10 mg as needed and she felt a bit better I have verified over the phone that she is taking all her medications correctly The new instruction from today, moving forward, would be to increase methadone to 20 mg every 8 hours on a scheduled basis and to continue to take oxycodone for breakthrough pain I reinforced the importance of taking laxatives on a scheduled basis while on increased dose of pain medicine I am hopeful, with better control of pain with methadone, she can reduce the need to take breakthrough oxycodone Reminded her about appointment to see interventional radiologist tomorrow to consider kyphoplasty to help with her back pain If such procedure is approved, I am hopeful we can slowly wean her off pain medicine in the future I will also try to reduce her prednisone dose in the future

## 2019-06-30 NOTE — Assessment & Plan Note (Signed)
She has achieved complete hematological response with just 2 doses of bendamustine Due to her ongoing uncontrolled pain and other health issues, I will hold off further chemotherapy and focus on supportive care

## 2019-07-01 ENCOUNTER — Encounter: Payer: Self-pay | Admitting: *Deleted

## 2019-07-01 ENCOUNTER — Ambulatory Visit
Admission: RE | Admit: 2019-07-01 | Discharge: 2019-07-01 | Disposition: A | Discharge status: Home / Self Care | Payer: Medicare Other | Source: Ambulatory Visit | Attending: Hematology and Oncology | Admitting: Hematology and Oncology

## 2019-07-01 ENCOUNTER — Telehealth: Payer: Medicare Other | Admitting: Hematology and Oncology

## 2019-07-01 ENCOUNTER — Other Ambulatory Visit: Payer: Self-pay

## 2019-07-01 DIAGNOSIS — M549 Dorsalgia, unspecified: Secondary | ICD-10-CM

## 2019-07-01 DIAGNOSIS — G893 Neoplasm related pain (acute) (chronic): Secondary | ICD-10-CM

## 2019-07-01 DIAGNOSIS — C9110 Chronic lymphocytic leukemia of B-cell type not having achieved remission: Secondary | ICD-10-CM

## 2019-07-01 HISTORY — PX: IR RADIOLOGIST EVAL & MGMT: IMG5224

## 2019-07-01 NOTE — Consult Note (Signed)
Chief Complaint: Patient was seen in consultation today for back pain and vertebral body compression fractures.  At the request of Gorsuch,Ni  Referring Physician(s): Gorsuch,Ni  History of Present Illness: April [name] is a 84 y.o. female with history of CLL and complains of debilitating back pain.  Patient fell outside of the cancer center while she is waiting for her husband on 06/02/2019.  Since that fall, she has had intermittent pain and it is very difficult for her to get out of a bed or chair.  She has been evaluated by palliative care and has been taking oxycodone.  Patient cannot localize the pain.  It sounds like the pain is in multiple different areas.  Patient is able to ambulate with a walker but it appears that ambulation is difficult at this time.  Patient was just evaluated by Dr. Alvy Bimler yesterday and she has achieved a complete hematologic response for her CLL.  No further chemotherapy is planned at this time.  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[place])    INCREASED RISK FOR LYMPHOMA   Osteoporosis 05/2018   T score -3.1 overall stable from prior study    Past Surgical History:  Procedure Laterality Date   ABDOMINAL HYSTERECTOMY  1980   BREAST IMPLANTS REMOVED  2001   BREAST SURGERY     Bilateral mastectomy   IR RADIOLOGIST EVAL & MGMT  07/01/2019   MASTECTOMY  1982   BILATERAL   OOPHORECTOMY     BSO   PARTIAL COLECTOMY     INTESTINAL ABSCESS WITH SALPINGECTOMY   RECONSTRUCTION LEFT ELBOW     TONSILLECTOMY     UMBILLICAL HERNIA REPAIR      Allergies: Codeine and Levofloxacin  Medications: Prior to Admission medications   Medication Sig Start Date End Date Taking? Authorizing Provider  acetaminophen (TYLENOL) 500 MG tablet Take 2 tablets (1,000 mg total) by mouth every 8 (eight) hours. 06/10/19  06/09/20  Mercy Riding, MD  amLODipine (NORVASC) 5 MG tablet Take 1 tablet (5 mg total) by mouth daily. 05/17/19   Shelly Coss, MD  augmented betamethasone dipropionate (DIPROLENE-AF) 0.05 % cream Apply 1 application topically 2 (two) times daily as needed (flare ups).  05/08/18   [provider]  bisacodyl (DULCOLAX) 10 MG suppository Place 1 suppository (10 mg total) rectally as needed for moderate constipation. 06/18/19   Heath Lark, MD  cyclobenzaprine (FLEXERIL) 5 MG tablet Take 1 tablet (5 mg total) by mouth 3 (three) times daily as needed for muscle spasms. 06/18/19   Heath Lark, MD  lidocaine (LIDODERM) 5 % Place 1 patch onto the skin every 12 (twelve) hours. Remove & Discard patch within 12 hours or as directed by MD 06/10/19 07/10/19  Mercy Riding, MD  methadone (DOLOPHINE) 10 MG tablet Take 2 tablets (20 mg total) by mouth every 8 (eight) hours. 06/30/19   Heath Lark, MD  oxyCODONE (OXY IR/ROXICODONE) 5 MG immediate release tablet Take 1 tablet (5 mg total) by mouth every 4 (four) hours as needed for severe pain. 06/16/19   Heath Lark, MD  polyethylene glycol (MIRALAX / GLYCOLAX) 17 g packet Take 17 g 1-3 times a day as needed for constipation 06/10/19   Gonfa, Bretta Bang T, MD  predniSONE (DELTASONE) 20 MG tablet Take 1 tablet (20 mg total) by mouth daily  with breakfast. 06/18/19   Heath Lark, MD  senna-docusate (SENOKOT-S) 8.6-50 MG tablet Take 1 tablet 1-2 times a day as needed for constipation 06/10/19   Mercy Riding, MD     Family History  Problem Relation Age of Onset   Cancer Brother        HODGKINS   Cancer Brother        leukemia   Parkinson's disease Brother    Heart disease Mother    Heart disease Sister     Social History   Socioeconomic History   Marital status: Married    Spouse name: Not on file   Number of children: Not on file   Years of education: Not on file   Highest education level: Not on file  Occupational History   Not on  file  Social Needs   Financial resource strain: Not on file   Food insecurity    Worry: Not on file    Inability: Not on file   Transportation needs    Medical: Not on file    Non-medical: Not on file  Tobacco Use   Smoking status: Former Smoker    Types: Cigarettes    Quit date: 03/26/1955    Years since quitting: 64.3   Smokeless tobacco: Never Used  Substance and Sexual Activity   Alcohol use: Yes    Alcohol/week: 5.0 standard drinks    Types: 5 Standard drinks or equivalent per week    Comment: wine   Drug use: No   Sexual activity: Never    Partners: Male    Birth control/protection: Surgical    Comment: HYST-1st intercourse 84 yo-More than 5 partners  Lifestyle   Physical activity    Days per week: Not on file    Minutes per session: Not on file   Stress: Not on file  Relationships   Social connections    Talks on phone: Not on file    Gets together: Not on file    Attends religious service: Not on file    Active member of club or organization: Not on file    Attends meetings of clubs or organizations: Not on file    Relationship status: Not on file  Other Topics Concern   Not on file  Social History Narrative   Not on file    Review of Systems  Constitutional: Positive for activity change and fatigue.  Musculoskeletal: Positive for back pain.    Vital Signs: BP (!) 152/88 (BP Location: Right Arm)    Pulse (!) 117    LMP 03/26/1979    SpO2 94%   Physical Exam Constitutional:      Comments: Frail and ill-appearing female  Cardiovascular:     Rate and Rhythm: Normal rate and regular rhythm.     Pulses: Normal pulses.     Heart sounds: Normal heart sounds.  Abdominal:     General: Abdomen is flat.     Palpations: Abdomen is soft.  Musculoskeletal:     Comments: Kyphosis.  Prominent scoliosis noted in the thoracolumbar spine.  Areas of skin redness overlying the spinous processes could be related to previous adhesive bandages or patches in  this area.  No evidence for skin breakdown or bleeding.  No focal tenderness overlying the spinous processes in the thoracic or lumbar spine.  Mild tenderness in the bilateral lower flank areas.  Lower flank pain appears to be intermittent.  Neurological:     Mental Status: She is alert.  Imaging: Dg Chest 2 View  Result Date: 06/07/2019 CLINICAL DATA:  Chest pain EXAM: CHEST - 2 VIEW COMPARISON:  June 02, 2019 FINDINGS: The cardiomediastinal silhouette is unchanged from prior exam with mild cardiomegaly. There is opacity in the right upper lung which is likely due to overlap of shadows from positioning. The left lung is clear. No pleural effusion or focal airspace consolidation. Again noted is diffuse osteopenia and scoliotic curvature. IMPRESSION: Opacity in the right upper lung which is likely thought to be due to overlap of shadows. If clinical concern remains would recommend repeat radiograph. Electronically Signed   By: Prudencio Pair M.D.   On: 06/07/2019 22:13   Ct Head Wo Contrast  Result Date: 06/03/2019 CLINICAL DATA:  Fall, headache EXAM: CT HEAD WITHOUT CONTRAST TECHNIQUE: Contiguous axial images were obtained from the base of the skull through the vertex without intravenous contrast. COMPARISON:  MRI 05/04/2013 FINDINGS: Brain: There is atrophy and chronic small vessel disease changes. No acute intracranial abnormality. Specifically, no hemorrhage, hydrocephalus, mass lesion, acute infarction, or significant intracranial injury. Vascular: No hyperdense vessel or unexpected calcification. Skull: No acute calvarial abnormality. Sinuses/Orbits: No acute finding Other: None IMPRESSION: Atrophy, chronic microvascular disease. No acute intracranial abnormality. Electronically Signed   By: Rolm Baptise M.D.   On: 06/03/2019 00:07   Ct Chest W Contrast  Result Date: 06/03/2019 CLINICAL DATA:  Fall from standing, history of CLL, flank pain and tenderness evaluate for splenic injury  or retroperitoneal hematoma. Left rib pain. EXAM: CT CHEST, ABDOMEN, AND PELVIS WITH CONTRAST TECHNIQUE: Multidetector CT imaging of the chest, abdomen and pelvis was performed following the standard protocol during bolus administration of intravenous contrast. CONTRAST:  161mL OMNIPAQUE IOHEXOL 300 MG/ML  SOLN COMPARISON:  CT 04/22/2019 FINDINGS: CT CHEST FINDINGS Cardiovascular: The aortic root is suboptimally assessed given cardiac pulsation artifact. No acute luminal abnormality of the thoracic aorta. Minimal atheromatous plaque. No periaortic stranding or hemorrhage. Slightly tortuous course of the thoracic aorta. Normal 3 vessel branching of the arch mild tortuosity of the brachiocephalic vessels. Proximal great vessels are otherwise unremarkable. Normal heart size. No pericardial effusion. Coronary artery calcifications are noted. Central pulmonary arteries are normal caliber without large central filling defects on this non tailored examination. Mediastinum/Nodes: Thyroid gland and thoracic inlet are unremarkable. No mediastinal hematoma or pneumomediastinum. No acute traumatic abnormality of the trachea or esophagus. No mediastinal, hilar or axillary adenopathy. Lungs/Pleura: Mild interlobular septal thickening is noted towards the lung apices and in the lung bases as well. Bandlike areas of opacity in the lower lobes and right middle lobe and lingula likely reflect areas of scarring and/or atelectasis. More dependent atelectasis is seen posteriorly. No effusion. No visible pneumothorax. Few stable sub 5 mm nodules are present in the right middle lobe (5/83, 5/65) and left lower lobe (5/87). Musculoskeletal: Inferior endplate compression deformity of the T8 vertebral body with approximately 30% height loss. Slightly increased anterior wedging compression deformity of the T11 vertebrae when compared to prior study from 04/22/2019 with 20% height loss anteriorly. Anterior wedging of the T12 vertebrae is  unchanged. No other acute vertebral fracture or compression deformity is seen remote posttraumatic deformity of left mid clavicle. Additional remote posttraumatic deformities of the left ninth through eleventh ribs are noted. When compared to no acute displaced rib fractures. No other acute or suspicious osseous lesions. Severe dextrocurvature of the midthoracic spine with compensatory curvature of the lower thoracic spine and associated chest wall deformity. CT ABDOMEN PELVIS FINDINGS Hepatobiliary: No direct  hepatic injury or perihepatic hematoma. Subcentimeter hypoattenuating focus along the anterior dome of the liver (4/40) too small to fully characterize on CT imaging but statistically likely benign. No concerning hepatic lesions. Gallbladder is normal. No pericholecystic inflammation or fluid. There is mild intra and extrahepatic biliary ductal dilatation with a small calcified gallstone seen in the distal common bile duct just proximal to the ampulla of Vater (4/60-61). Pancreas: Mild pancreatic atrophy. No pancreatic ductal dilatation or surrounding inflammatory changes. Spleen: No direct splenic injury or perisplenic hematoma. No suspicious splenic lesions. Spleen at the upper limits of normal for size. Adrenals/Urinary Tract: No adrenal hemorrhage or suspicious adrenal lesions. No direct renal injury or perirenal hemorrhage. No extravasation of contrast is seen on excretory phase delayed imaging. Multiple fluid attenuation parapelvic left renal cysts are similar to comparison CT from September of 2020. Kidneys are otherwise unremarkable, without renal calculi, suspicious lesion, or hydronephrosis. Bladder is moderately distended but otherwise unremarkable. Stomach/Bowel: Distal esophagus and stomach are unremarkable. Air-filled 2 cm duodenal diverticulum (4/53), slightly more distended than on prior study but without adjacent inflammation. No small bowel dilatation or wall thickening. Cecum is displaced  into the midline pelvis the appendix is not well visualized though no pericecal inflammation is seen. There is a moderate stool burden throughout the colon. No colonic dilatation or wall thickening however. Colonic anastomosis noted in the midline upper abdomen. Distal colonic diverticulosis without acute peridiverticular inflammation to suggest diverticulitis. Vascular/Lymphatic: Atherosclerotic plaque within the normal caliber aorta. No suspicious or enlarged lymph nodes in the included lymphatic chains. Reproductive: The prostate and seminal vesicles are unremarkable. Other: No free fluid or free air. No bowel containing hernias. No evidence of retroperitoneal or body wall hematoma. No significant soft tissue contusion though portions of the lateral most flanks are partially collimated from view due to patient size. Musculoskeletal: Superior endplate compression deformity of L2 is unchanged from comparison CT 04/22/2019. Dextrocurvature of the lumbar spine centered at L3. Multilevel degenerative changes are present in the imaged portions of the spine. Features most pronounced at L1-2 and L5-S1. IMPRESSION: 1. New T8 compression deformity with approximately 30% height loss. Worsening anterior compression deformity of the T11 vertebrae now with 20% height loss anteriorly. Additional T12 and L2 compression deformities are stable from prior. 2. No other acute traumatic injury within the chest, abdomen, or pelvis. Specifically, no splenic injury or retroperitoneal hematoma. 3. Mild intra and extrahepatic biliary ductal dilatation with a small calcified gallstone seen in the distal common bile duct just proximal to the ampulla of Vater. Correlate with LFTs and consider further evaluation with MRCP as clinically indicated. 4. Stable sub 5 mm nodules in the right middle and left lower lobe. No further evaluation is required if patient is low risk however should continue with previously recommended 12 month follow-up in  September of 2021 if patient is high risk. 5. Severe scoliotic curvature of the thoracolumbar spine with associated chest wall deformity. 6. Duodenal and colonic diverticula without features of diverticulitis. 7. Moderate bladder distention, correlate for features of outlet obstruction or retention. 8. Aortic Atherosclerosis (ICD10-I70.0). Electronically Signed   By: Lovena Le M.D.   On: 06/03/2019 00:03   Ct Angio Chest Pe W Or Wo Contrast  Result Date: 06/08/2019 CLINICAL DATA:  Elevated D-dimer, pain, history CLL, hypertension EXAM: CT ANGIOGRAPHY CHEST WITH CONTRAST TECHNIQUE: Multidetector CT imaging of the chest was performed using the standard protocol during bolus administration of intravenous contrast. Multiplanar CT image reconstructions and MIPs were obtained to  evaluate the vascular anatomy. CONTRAST:  137mL OMNIPAQUE IOHEXOL 350 MG/ML SOLN IV COMPARISON:  CT chest 06/02/2019 FINDINGS: Cardiovascular: Atherosclerotic calcifications aorta, proximal great vessels, and minimally in coronary arteries. Aneurysmal dilatation of ascending thoracic aorta 4.1 cm transverse. No evidence of aortic dissection. Pulmonary arteries are suboptimally opacified as well as limited by respiratory motion. No definite large or central pulmonary emboli are visualized. Unable to exclude small and peripheral emboli by this study. No pericardial effusion. Enlargement of cardiac chambers. Mediastinum/Nodes: Esophagus unremarkable. Base of cervical region normal appearance. Lungs/Pleura: Bibasilar atelectasis in the lower lobes. Remaining lungs grossly clear. No pleural effusion or pneumothorax. Upper Abdomen: Spleen appears prominent in size but incompletely visualized. Remaining visualized upper abdomen unremarkable Musculoskeletal: Significant cervicothoracic scoliosis. Compression deformities of several thoracic vertebra are again seen. Review of the MIP images confirms the above findings. IMPRESSION: No gross evidence  of large or central pulmonary emboli is identified on exam limited secondary to suboptimal pulmonary arterial opacification and respiratory motion. Bibasilar atelectasis. Osseous demineralization with compression fractures of several thoracic vertebra. Aneurysmal dilatation ascending thoracic aorta 4.1 cm transverse, recommendation below. Recommend annual imaging followup by CTA or MRA. This recommendation follows 2010 ACCF/AHA/AATS/ACR/ASA/SCA/SCAI/SIR/STS/SVM Guidelines for the Diagnosis and Management of Patients with Thoracic Aortic Disease. Circulation. 2010; [id number]. Aortic aneurysm NOS (ICD10-I71.9) Aortic Atherosclerosis (ICD10-I70.0). Aortic aneurysm NOS (ICD10-I71.9).  For Electronically Signed   By: Lavonia Dana M.D.   On: 06/08/2019 17:12   US Abdomen Complete  Result Date: 06/08/2019 CLINICAL DATA:  Abdominal distention. EXAM: ABDOMEN ULTRASOUND COMPLETE COMPARISON:  CT 06/02/2019. FINDINGS: Gallbladder: No gallstones or wall thickening visualized. No sonographic Murphy sign noted by sonographer. Common bile duct: Diameter: 8.5 mm. Intrahepatic biliary ductal dilatation also noted. Distal common bile duct stone suspected on prior CT. Again MRCP may prove useful for further evaluation. Liver: No focal lesion identified. Within normal limits in parenchymal echogenicity. Portal vein is patent on color Doppler imaging with normal direction of blood flow towards the liver. IVC: No abnormality visualized. Pancreas: Visualized portion unremarkable. Spleen: Size and appearance within normal limits. Right Kidney: Length: 8.6 cm. Echogenicity within normal limits. No mass or hydronephrosis visualized. Left Kidney: Length: 9.5 cm. Echogenicity within normal limits. No mass or hydronephrosis visualized. Abdominal aorta: No aneurysm visualized. Other findings: No ascites. IMPRESSION: 1. Common bile duct is dilated 8.5 mm. Intrahepatic biliary ductal dilatation also noted. Distal common bile duct stone  suspected on prior CT of 06/02/2019. Again MRCP may prove useful for further evaluation. 2.  No other abnormality identified.  No ascites. Electronically Signed   By: Marcello Moores  Register   On: 06/08/2019 07:05   Ct Abdomen Pelvis W Contrast  Result Date: 06/03/2019 CLINICAL DATA:  Fall from standing, history of CLL, flank pain and tenderness evaluate for splenic injury or retroperitoneal hematoma. Left rib pain. EXAM: CT CHEST, ABDOMEN, AND PELVIS WITH CONTRAST TECHNIQUE: Multidetector CT imaging of the chest, abdomen and pelvis was performed following the standard protocol during bolus administration of intravenous contrast. CONTRAST:  130mL OMNIPAQUE IOHEXOL 300 MG/ML  SOLN COMPARISON:  CT 04/22/2019 FINDINGS: CT CHEST FINDINGS Cardiovascular: The aortic root is suboptimally assessed given cardiac pulsation artifact. No acute luminal abnormality of the thoracic aorta. Minimal atheromatous plaque. No periaortic stranding or hemorrhage. Slightly tortuous course of the thoracic aorta. Normal 3 vessel branching of the arch mild tortuosity of the brachiocephalic vessels. Proximal great vessels are otherwise unremarkable. Normal heart size. No pericardial effusion. Coronary artery calcifications are noted. Central pulmonary arteries are normal  caliber without large central filling defects on this non tailored examination. Mediastinum/Nodes: Thyroid gland and thoracic inlet are unremarkable. No mediastinal hematoma or pneumomediastinum. No acute traumatic abnormality of the trachea or esophagus. No mediastinal, hilar or axillary adenopathy. Lungs/Pleura: Mild interlobular septal thickening is noted towards the lung apices and in the lung bases as well. Bandlike areas of opacity in the lower lobes and right middle lobe and lingula likely reflect areas of scarring and/or atelectasis. More dependent atelectasis is seen posteriorly. No effusion. No visible pneumothorax. Few stable sub 5 mm nodules are present in the right  middle lobe (5/83, 5/65) and left lower lobe (5/87). Musculoskeletal: Inferior endplate compression deformity of the T8 vertebral body with approximately 30% height loss. Slightly increased anterior wedging compression deformity of the T11 vertebrae when compared to prior study from 04/22/2019 with 20% height loss anteriorly. Anterior wedging of the T12 vertebrae is unchanged. No other acute vertebral fracture or compression deformity is seen remote posttraumatic deformity of left mid clavicle. Additional remote posttraumatic deformities of the left ninth through eleventh ribs are noted. When compared to no acute displaced rib fractures. No other acute or suspicious osseous lesions. Severe dextrocurvature of the midthoracic spine with compensatory curvature of the lower thoracic spine and associated chest wall deformity. CT ABDOMEN PELVIS FINDINGS Hepatobiliary: No direct hepatic injury or perihepatic hematoma. Subcentimeter hypoattenuating focus along the anterior dome of the liver (4/40) too small to fully characterize on CT imaging but statistically likely benign. No concerning hepatic lesions. Gallbladder is normal. No pericholecystic inflammation or fluid. There is mild intra and extrahepatic biliary ductal dilatation with a small calcified gallstone seen in the distal common bile duct just proximal to the ampulla of Vater (4/60-61). Pancreas: Mild pancreatic atrophy. No pancreatic ductal dilatation or surrounding inflammatory changes. Spleen: No direct splenic injury or perisplenic hematoma. No suspicious splenic lesions. Spleen at the upper limits of normal for size. Adrenals/Urinary Tract: No adrenal hemorrhage or suspicious adrenal lesions. No direct renal injury or perirenal hemorrhage. No extravasation of contrast is seen on excretory phase delayed imaging. Multiple fluid attenuation parapelvic left renal cysts are similar to comparison CT from September of 2020. Kidneys are otherwise unremarkable,  without renal calculi, suspicious lesion, or hydronephrosis. Bladder is moderately distended but otherwise unremarkable. Stomach/Bowel: Distal esophagus and stomach are unremarkable. Air-filled 2 cm duodenal diverticulum (4/53), slightly more distended than on prior study but without adjacent inflammation. No small bowel dilatation or wall thickening. Cecum is displaced into the midline pelvis the appendix is not well visualized though no pericecal inflammation is seen. There is a moderate stool burden throughout the colon. No colonic dilatation or wall thickening however. Colonic anastomosis noted in the midline upper abdomen. Distal colonic diverticulosis without acute peridiverticular inflammation to suggest diverticulitis. Vascular/Lymphatic: Atherosclerotic plaque within the normal caliber aorta. No suspicious or enlarged lymph nodes in the included lymphatic chains. Reproductive: The prostate and seminal vesicles are unremarkable. Other: No free fluid or free air. No bowel containing hernias. No evidence of retroperitoneal or body wall hematoma. No significant soft tissue contusion though portions of the lateral most flanks are partially collimated from view due to patient size. Musculoskeletal: Superior endplate compression deformity of L2 is unchanged from comparison CT 04/22/2019. Dextrocurvature of the lumbar spine centered at L3. Multilevel degenerative changes are present in the imaged portions of the spine. Features most pronounced at L1-2 and L5-S1. IMPRESSION: 1. New T8 compression deformity with approximately 30% height loss. Worsening anterior compression deformity of the T11 vertebrae  now with 20% height loss anteriorly. Additional T12 and L2 compression deformities are stable from prior. 2. No other acute traumatic injury within the chest, abdomen, or pelvis. Specifically, no splenic injury or retroperitoneal hematoma. 3. Mild intra and extrahepatic biliary ductal dilatation with a small calcified  gallstone seen in the distal common bile duct just proximal to the ampulla of Vater. Correlate with LFTs and consider further evaluation with MRCP as clinically indicated. 4. Stable sub 5 mm nodules in the right middle and left lower lobe. No further evaluation is required if patient is low risk however should continue with previously recommended 12 month follow-up in September of 2021 if patient is high risk. 5. Severe scoliotic curvature of the thoracolumbar spine with associated chest wall deformity. 6. Duodenal and colonic diverticula without features of diverticulitis. 7. Moderate bladder distention, correlate for features of outlet obstruction or retention. 8. Aortic Atherosclerosis (ICD10-I70.0). Electronically Signed   By: Lovena Le M.D.   On: 06/03/2019 00:03   Mr 3d Recon At Scanner  Result Date: 06/09/2019 CLINICAL DATA:  Abnormal liver function tests with cholelithiasis. EXAM: MRI ABDOMEN WITHOUT AND WITH CONTRAST (INCLUDING MRCP) TECHNIQUE: Multiplanar multisequence MR imaging of the abdomen was performed both before and after the administration of intravenous contrast. Heavily T2-weighted images of the biliary and pancreatic ducts were obtained, and three-dimensional MRCP images were rendered by post processing. CONTRAST:  54mL GADAVIST GADOBUTROL 1 MMOL/ML IV SOLN COMPARISON:  CT abdomen/pelvis 06/02/2019. FINDINGS: Lower chest: The heart is enlarged. Hepatobiliary: 7 mm simple cyst is identified in the anterior liver no suspicious enhancing liver abnormality. Liver measures 16.2 cm craniocaudal length, upper normal. Gallbladder is distended no evidence for gallstones by MRI. Mild intrahepatic biliary duct prominence. Common bile duct in the head of the pancreas measures 7 mm diameter which is upper normal for patient age. The common bile duct shows rather abrupt transition at the ampulla, but no definite evidence for choledocholithiasis. Pancreas: No focal mass lesion. Main pancreatic duct  measures 3 mm diameter, upper normal. No intraparenchymal cyst. No peripancreatic edema. Spleen: Spleen is 12.4 cm craniocaudal length. No focal abnormality. Adrenals/Urinary Tract: No adrenal nodule or mass. No suspicious enhancing abnormality in either kidney. Tiny cortical T2 hyperintensities in the cortex of the kidneys without definite enhancement suggest tiny cortical cyst. Central sinus cysts are noted in the kidneys bilaterally, left greater than right. Stomach/Bowel: Stomach is unremarkable. No gastric wall thickening. No evidence of outlet obstruction. Duodenum is normally positioned as is the ligament of Treitz. No small bowel or colonic dilatation within the visualized abdomen. Vascular/Lymphatic: No abdominal aortic aneurysm. No abdominal lymphadenopathy Other: Fluid-filled structure in the central pelvis is been incompletely visualized but is presumably a markedly dilated urinary bladder. Musculoskeletal: No abnormal marrow enhancement within the visualized bony anatomy IMPRESSION: 1. Mild intra and extrahepatic biliary duct prominence with common bile duct measuring upper normal for patient age. No gallstones evident and no definite choledocholithiasis. Somewhat abrupt transition of the duct into the ampulla noted, but nonspecific. ERCP could be used to further evaluate for ampullary lesion as clinically warranted. 2. Hepatic and renal cysts. 3. Presumed marked distention of the urinary bladder. Electronically Signed   By: Misty Stanley M.D.   On: 06/09/2019 07:58   Dg Humerus Right  Result Date: 06/02/2019 CLINICAL DATA:  Fall, right arm pain EXAM: RIGHT HUMERUS - 2+ VIEW COMPARISON:  None. FINDINGS: There is no evidence of fracture or other focal bone lesions. Soft tissues are unremarkable. IMPRESSION: Negative. Electronically  Signed   By: Rolm Baptise M.D.   On: 06/02/2019 22:07   Mr Abdomen Mrcp Moise Boring Contast  Result Date: 06/09/2019 CLINICAL DATA:  Abnormal liver function tests with  cholelithiasis. EXAM: MRI ABDOMEN WITHOUT AND WITH CONTRAST (INCLUDING MRCP) TECHNIQUE: Multiplanar multisequence MR imaging of the abdomen was performed both before and after the administration of intravenous contrast. Heavily T2-weighted images of the biliary and pancreatic ducts were obtained, and three-dimensional MRCP images were rendered by post processing. CONTRAST:  79mL GADAVIST GADOBUTROL 1 MMOL/ML IV SOLN COMPARISON:  CT abdomen/pelvis 06/02/2019. FINDINGS: Lower chest: The heart is enlarged. Hepatobiliary: 7 mm simple cyst is identified in the anterior liver no suspicious enhancing liver abnormality. Liver measures 16.2 cm craniocaudal length, upper normal. Gallbladder is distended no evidence for gallstones by MRI. Mild intrahepatic biliary duct prominence. Common bile duct in the head of the pancreas measures 7 mm diameter which is upper normal for patient age. The common bile duct shows rather abrupt transition at the ampulla, but no definite evidence for choledocholithiasis. Pancreas: No focal mass lesion. Main pancreatic duct measures 3 mm diameter, upper normal. No intraparenchymal cyst. No peripancreatic edema. Spleen: Spleen is 12.4 cm craniocaudal length. No focal abnormality. Adrenals/Urinary Tract: No adrenal nodule or mass. No suspicious enhancing abnormality in either kidney. Tiny cortical T2 hyperintensities in the cortex of the kidneys without definite enhancement suggest tiny cortical cyst. Central sinus cysts are noted in the kidneys bilaterally, left greater than right. Stomach/Bowel: Stomach is unremarkable. No gastric wall thickening. No evidence of outlet obstruction. Duodenum is normally positioned as is the ligament of Treitz. No small bowel or colonic dilatation within the visualized abdomen. Vascular/Lymphatic: No abdominal aortic aneurysm. No abdominal lymphadenopathy Other: Fluid-filled structure in the central pelvis is been incompletely visualized but is presumably a markedly  dilated urinary bladder. Musculoskeletal: No abnormal marrow enhancement within the visualized bony anatomy IMPRESSION: 1. Mild intra and extrahepatic biliary duct prominence with common bile duct measuring upper normal for patient age. No gallstones evident and no definite choledocholithiasis. Somewhat abrupt transition of the duct into the ampulla noted, but nonspecific. ERCP could be used to further evaluate for ampullary lesion as clinically warranted. 2. Hepatic and renal cysts. 3. Presumed marked distention of the urinary bladder. Electronically Signed   By: Misty Stanley M.D.   On: 06/09/2019 07:58   Vas [place] Lower Extremity Venous (dvt)  Result Date: 06/09/2019  Lower Venous Study Indications: Swelling.  Comparison Study: no prior Performing Technologist: Abram Sander RVS  Examination Guidelines: A complete evaluation includes B-mode imaging, spectral Doppler, color Doppler, and power Doppler as needed of all accessible portions of each vessel. Bilateral testing is considered an integral part of a complete examination. Limited examinations for reoccurring indications may be performed as noted.  +---------+---------------+---------+-----------+----------+--------------+  RIGHT     Compressibility Phasicity Spontaneity Properties Thrombus Aging  +---------+---------------+---------+-----------+----------+--------------+  CFV       Full            Yes       Yes                                    +---------+---------------+---------+-----------+----------+--------------+  SFJ       Full                                                             +---------+---------------+---------+-----------+----------+--------------+  FV Prox   Full                                                             +---------+---------------+---------+-----------+----------+--------------+  FV Mid    Full                                                              +---------+---------------+---------+-----------+----------+--------------+  FV Distal Full                                                             +---------+---------------+---------+-----------+----------+--------------+  PFV       Full                                                             +---------+---------------+---------+-----------+----------+--------------+  POP       Full            Yes       Yes                                    +---------+---------------+---------+-----------+----------+--------------+  PTV       Full                                                             +---------+---------------+---------+-----------+----------+--------------+  PERO      Full                                                             +---------+---------------+---------+-----------+----------+--------------+   +---------+---------------+---------+-----------+----------+--------------+  LEFT      Compressibility Phasicity Spontaneity Properties Thrombus Aging  +---------+---------------+---------+-----------+----------+--------------+  CFV       Full            Yes       Yes                                    +---------+---------------+---------+-----------+----------+--------------+  SFJ       Full                                                             +---------+---------------+---------+-----------+----------+--------------+  FV Prox   Full                                                             +---------+---------------+---------+-----------+----------+--------------+  FV Mid    Full                                                             +---------+---------------+---------+-----------+----------+--------------+  FV Distal Full                                                             +---------+---------------+---------+-----------+----------+--------------+  PFV       Full                                                              +---------+---------------+---------+-----------+----------+--------------+  POP       Full            Yes       Yes                                    +---------+---------------+---------+-----------+----------+--------------+  PTV       Full                                                             +---------+---------------+---------+-----------+----------+--------------+  PERO      Full                                                             +---------+---------------+---------+-----------+----------+--------------+     Summary: Right: There is no evidence of deep vein thrombosis in the lower extremity. No cystic structure found in the popliteal fossa. Left: There is no evidence of deep vein thrombosis in the lower extremity. No cystic structure found in the popliteal fossa.  *See table(s) above for measurements and observations. Electronically signed by Harold Barban MD on 06/09/2019 at 5:51:57 PM.    Final    [facility] Radiologist Eval & Mgmt  Result Date: 07/01/2019 Please refer to notes tab for details about interventional procedure. (Op Note)   Labs:  CBC: Recent Labs    06/08/19 0526 06/09/19 0454 06/10/19 0532 06/18/19 1215  WBC 11.9* 14.7* 9.0 8.6  HGB 10.3* 11.1* 10.7* 11.6*  HCT 32.7*  34.4* 32.5* 34.8*  PLT 115* 139* 122* 159    COAGS: No results for input(s): INR, APTT in the last 8760 hours.  BMP: Recent Labs    06/08/19 0526 06/09/19 0454 06/10/19 0532 06/18/19 1215  NA 136 136 136 138  K 4.1 4.5 4.1 4.0  CL 100 100 102 102  CO2 27 26 27 26   GLUCOSE 103* 178* 107* 135*  BUN 11 15 14 10   CALCIUM 8.8* 9.2 9.2 9.3  CREATININE 0.79 0.73 0.74 0.73  GFRNONAA >60 >60 >60 >60  GFRAA >60 >60 >60 >60    LIVER FUNCTION TESTS: Recent Labs    05/15/19 0515 05/25/19 1142 06/08/19 0526 06/18/19 1215  BILITOT 1.0 0.5 1.7* 1.0  AST 26 20 20  12*  ALT 52* 25 26 30   ALKPHOS 95 88 93 182*  PROT 6.3* 6.8 5.8* 6.5  ALBUMIN 3.1* 3.7 3.3* 3.8    TUMOR MARKERS: No  results for input(s): AFPTM, CEA, CA199, CHROMGRNA in the last 8760 hours.  Assessment and Plan:  84 year old with CLL and severe back pain since a fall in November 2020.  I reviewed patient's imaging and there is clearly evidence for a compression fracture along the inferior plate of T8 that is new since April 22, 2019.  There may also be a new compression fracture at T11.  In addition, patient has a significant kyphosis and scoliosis in the thoracic spine.  On examination, the patient does not localize the pain to the T8 or T11 vertebral bodies.  Patient is unable to clearly localize where she hurts but it appears that the pain is more in the lower flank areas.  Patient had a abdominal MRI on 06/08/2019 and I do not clearly see a cause for her symptoms.  Specifically, I do not see a sacral insufficiency fracture.  I explained the physical exam and imaging findings with the patient and her husband.  It is possible that the compression fractures at T8 and T11 are giving her referred pain in her lower flank and pelvic areas.  I would like to get an MRI of the thoracic and lumbar spine to further evaluate T8 and T11 vertebral bodies and exclude any other areas of potential disease.  If the MRI does not demonstrate any other potential sources of the back pain then we can consider vertebral body augmentation procedures at T8 and T11.  Thank you for this interesting consult.  I greatly enjoyed meeting April [name] and look forward to participating in their care.  A copy of this report was sent to the requesting provider on this date.  Electronically Signed: Burman Riis 07/01/2019, 4:37 PM   I spent a total of  30 Minutes   in face to face in clinical consultation, greater than 50% of which was counseling/coordinating care for back pain and vertebral body compression fractures

## 2019-07-02 ENCOUNTER — Encounter: Payer: Self-pay | Admitting: Gynecology

## 2019-07-02 ENCOUNTER — Telehealth: Payer: Self-pay | Admitting: *Deleted

## 2019-07-02 ENCOUNTER — Ambulatory Visit: Payer: Medicare Other | Admitting: Gynecology

## 2019-07-02 ENCOUNTER — Other Ambulatory Visit: Payer: Self-pay | Admitting: Diagnostic Radiology

## 2019-07-02 VITALS — BP 124/80 | Ht <= 58 in | Wt 96.0 lb

## 2019-07-02 DIAGNOSIS — Z01419 Encounter for gynecological examination (general) (routine) without abnormal findings: Secondary | ICD-10-CM | POA: Diagnosis not present

## 2019-07-02 DIAGNOSIS — M546 Pain in thoracic spine: Secondary | ICD-10-CM

## 2019-07-02 DIAGNOSIS — Z853 Personal history of malignant neoplasm of breast: Secondary | ICD-10-CM

## 2019-07-02 DIAGNOSIS — N952 Postmenopausal atrophic vaginitis: Secondary | ICD-10-CM

## 2019-07-02 DIAGNOSIS — M545 Low back pain, unspecified: Secondary | ICD-10-CM

## 2019-07-02 DIAGNOSIS — M810 Age-related osteoporosis without current pathological fracture: Secondary | ICD-10-CM

## 2019-07-02 NOTE — Patient Instructions (Signed)
Follow-up with your other physicians for ongoing evaluation and treatment of your pain.

## 2019-07-02 NOTE — Progress Notes (Signed)
    April [name] 11/17/1934 [id number]        84 y.o.  G0P0 for annual gynecologic exam.  Without gynecologic complaints.  Was seen in October for some vaginal spotting which has resolved.  Had recent fall at the cancer center and is having a lot of pain which she is seeing other physicians for.  Past medical history,surgical history, problem list, medications, allergies, family history and social history were all reviewed and documented as reviewed in the EPIC chart.  ROS:  Performed with pertinent positives and negatives included in the history, assessment and plan.   Additional significant findings : None   Exam: April [name] Vitals:   07/02/19 1209  BP: 124/80  Weight: 96 lb (43.5 kg)  Height: 4\' 10"  (1.473 m)   Body mass index is 20.06 kg/m.  General appearance:  Normal affect, orientation and appearance. Skin: Grossly normal HEENT: Without gross lesions.  No cervical or supraclavicular adenopathy. Thyroid normal.  Lungs:  Clear without wheezing, rales or rhonchi Cardiac: RR, without RMG Abdominal:  Soft, nontender, without masses, guarding, rebound, organomegaly or hernia Breasts: Status post bilateral mastectomies.  No chest wall masses or axillary adenopathy. Pelvic:  Ext, BUS, Vagina: With atrophic changes.  No other abnormalities noted.  Adnexa: Without masses or tenderness    Anus and perineum: Normal   Rectovaginal: Normal sphincter tone without palpated masses or tenderness.    Assessment/Plan:  84 y.o. G0P0 female for annual gynecologic exam.  Status post TAH/BSO in the past.  1. Low back pain.  Being evaluated by other physicians.  Reviewed her CT scan which showed compression fractures in the spine.  Her other physicians felt this was not the source of her pain.  We discussed her osteoporosis where her DEXA last year was stable from her prior studies.  She is on Prolia for 6 years now.  The issue as to whether to switch to a different medication such  as Evinity or Forteo discussed.  She is being actively treated for CLL.  At this point because of her pain and other issues will hold on switching medications and get her through her this acute event.  We will then plan on discussion about switching medications.  In review of her CT scan done because of the following noted her reproductive system had "prostate and seminal vesicles" unremarkable.  Will notify radiology to correct and reviewed the study again. 2. History of breast cancer status post bilateral mastectomies.  Exam NED. 3. Colonoscopy proximately 10 years ago per her history.  Will follow up with her primary provider for colon screening recommendations. 4. Pap smear 2011.  No Pap smear done today.  No history of significant abnormal Pap smears.  We both agree to stop screening per current screening guidelines. 5. Health maintenance.  Patient will continue to follow-up with her other physicians for evaluation of her pain and treatment.  No blood work or other studies ordered today.   [name] MD, 12:35 PM 07/02/2019

## 2019-07-02 NOTE — Telephone Encounter (Signed)
Patient reported she will attempt to take the methadone again. She does not want to make any changes yet.The zofran worked to ease the nausea. She understands to call this office with any concern or questions. Advised patient we would check on her on Monday- but she should call sooner if needed.

## 2019-07-02 NOTE — Telephone Encounter (Signed)
Patient called reporting the methadone has made her nauseated and she has vomited 5 times since starting it yesterday. She has only taken 2 doses. Yesterday morning and last evening. She does not think she can tolerate this pain medication. She has zofran left over from previous treatments. She has only taken Tylenol this morning. She took a Zofran since she was afraid to mix the methadone and Zofran. Her pain has not improved with the methadone.  Can she use this to control nausea and vomiting with methadone on a regular basis?

## 2019-07-02 NOTE — Telephone Encounter (Signed)
The purpose of methadone is to provide long acting pain relief and that is the easiest of all types to tolerate She can just take oxycodone as needed if she does not want to take Dilaudid

## 2019-07-03 ENCOUNTER — Other Ambulatory Visit: Payer: Self-pay

## 2019-07-03 ENCOUNTER — Emergency Department (HOSPITAL_COMMUNITY)
Admission: EM | Admit: 2019-07-03 | Discharge: 2019-07-03 | Disposition: A | Discharge status: Home / Self Care | Payer: Medicare Other | Attending: Emergency Medicine | Admitting: Emergency Medicine

## 2019-07-03 ENCOUNTER — Encounter (HOSPITAL_COMMUNITY): Payer: Self-pay | Admitting: Emergency Medicine

## 2019-07-03 DIAGNOSIS — I10 Essential (primary) hypertension: Secondary | ICD-10-CM | POA: Diagnosis not present

## 2019-07-03 DIAGNOSIS — Z87891 Personal history of nicotine dependence: Secondary | ICD-10-CM | POA: Diagnosis not present

## 2019-07-03 DIAGNOSIS — S32000D Wedge compression fracture of unspecified lumbar vertebra, subsequent encounter for fracture with routine healing: Secondary | ICD-10-CM | POA: Insufficient documentation

## 2019-07-03 DIAGNOSIS — Z853 Personal history of malignant neoplasm of breast: Secondary | ICD-10-CM | POA: Insufficient documentation

## 2019-07-03 DIAGNOSIS — W19XXXD Unspecified fall, subsequent encounter: Secondary | ICD-10-CM | POA: Diagnosis not present

## 2019-07-03 DIAGNOSIS — M545 Low back pain, unspecified: Secondary | ICD-10-CM

## 2019-07-03 DIAGNOSIS — Z85828 Personal history of other malignant neoplasm of skin: Secondary | ICD-10-CM | POA: Insufficient documentation

## 2019-07-03 DIAGNOSIS — Z856 Personal history of leukemia: Secondary | ICD-10-CM | POA: Diagnosis not present

## 2019-07-03 MED ORDER — HYDROMORPHONE HCL 1 MG/ML IJ SOLN
1.0000 mg | Freq: Once | INTRAMUSCULAR | Status: AC
  Administered 2019-07-03: 1 mg via INTRAVENOUS
  Filled 2019-07-03: qty 1

## 2019-07-03 MED ORDER — HYDROMORPHONE HCL 2 MG PO TABS: 2.0000 mg | ORAL_TABLET | ORAL | 0 refills | Status: DC | PRN

## 2019-07-03 MED ORDER — KETOROLAC TROMETHAMINE 15 MG/ML IJ SOLN
15.0000 mg | Freq: Once | INTRAMUSCULAR | Status: AC
  Administered 2019-07-03: 15 mg via INTRAVENOUS
  Filled 2019-07-03: qty 1

## 2019-07-03 MED ORDER — ONDANSETRON HCL 4 MG/2ML IJ SOLN
4.0000 mg | Freq: Once | INTRAMUSCULAR | Status: AC
  Administered 2019-07-03: 4 mg via INTRAVENOUS
  Filled 2019-07-03: qty 2

## 2019-07-03 NOTE — Discharge Instructions (Addendum)
You may take 1-2 tablets of the hydromorphone every four hours, as needed. Please work with your primary care provider and your oncologist to adjust your pain medication.

## 2019-07-03 NOTE — ED Notes (Signed)
Pt was verbalized discharge instructions. Pt had no further questions at this time. NAD. 

## 2019-07-03 NOTE — ED Triage Notes (Signed)
Patient is complaining of back pain. Patient has leukemia. Patient states her back started after she fell on the patio 06/02/2019. Patient is not complaining of any other symptoms.

## 2019-07-03 NOTE — ED Notes (Signed)
Patient was slow to get out of bed but ambulated to bathroom and back to room with no assistance

## 2019-07-03 NOTE — ED Provider Notes (Signed)
[facility] DEPT Provider Note   CSN: [id number] Arrival date & time: 07/03/19  0306    History   Chief Complaint Chief Complaint  Patient presents with  . Back Pain    HPI JANELLA PERELLA is a 84 y.o. female.   The history is provided by the patient.  Back Pain She has history of chronic lymphocytic leukemia and compression fractures of the spinal column and comes in with worsening low back pain without radiation.  She has taken oxycodone 5 mg, which has not given her relief.  She had been on Dolophine, but stopped taking it because of nausea.  She denies any weakness, numbness, tingling.  She denies any bowel or bladder incontinence.  She has not had a bowel movement yesterday or today.  Of note, she had originally been scheduled for evaluation by interventional radiology for possible kyphoplasty but she states that and had been decided the kyphoplasty was not going to help her.  Past Medical History:  Diagnosis Date  . Breast CA ([place])   . Bronchitis, chronic ([place])   . Eye hemorrhage, left   . Leukemia, chronic lymphoid ([place])   . 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 Osteoporosis 05/2018   T score -3.1 overall stable from prior study    Patient Active Problem List   Diagnosis Date Noted  . Other constipation 06/18/2019  . Physical debility 06/18/2019  . Abdominal distension 06/08/2019  . Severe pain 06/08/2019  . Intractable back pain 06/08/2019  . Closed wedge compression fracture of T8 vertebra ([place]) 06/07/2019  . Sinus tachycardia 06/07/2019  . Intractable pain 06/07/2019  . Goals of care, counseling/discussion 05/14/2019  . Abnormal liver enzymes 05/14/2019  . Dehydration 05/14/2019  . Failure to thrive in adult 05/14/2019  . Abdominal pain 04/16/2019  . Cancer associated pain 04/16/2019  . Breast cancer ([place]) 06/19/2018  . Skin lesion 05/06/2017  . Essential hypertension 12/14/2016  . Systolic  hypertension, isolated 11/16/2016  . Central retinal vein occlusion 10/18/2016  . Preventive measure 04/28/2015  . Thrombocytopenia ([place]) 04/28/2015  . Idiopathic scoliosis 10/23/2012  . CLL (chronic lymphocytic leukemia) ([place]) 03/02/2009  . VENOUS INSUFFICIENCY, CHRONIC 10/28/2008  . Dyslipidemia 10/27/2007  . Osteoporosis 10/27/2007  . SKIN CANCER, HX OF 10/27/2007  . DIVERTICULITIS, HX OF 04/08/2007    Past Surgical History:  Procedure Laterality Date  . ABDOMINAL HYSTERECTOMY  1980  . BREAST IMPLANTS REMOVED  2001  . BREAST SURGERY     Bilateral mastectomy  . IR RADIOLOGIST EVAL & MGMT  07/01/2019  . MASTECTOMY  1982   BILATERAL  . OOPHORECTOMY     BSO  . PARTIAL COLECTOMY     INTESTINAL ABSCESS WITH SALPINGECTOMY  . RECONSTRUCTION LEFT ELBOW    . TONSILLECTOMY    . UMBILLICAL HERNIA REPAIR       OB History    Gravida  0   Para  0   Term      Preterm      AB      Living        SAB      TAB      Ectopic      Multiple      Live Births               Home Medications    Prior to Admission medications   Medication Sig Start Date End Date Taking? Authorizing Provider  acetaminophen (TYLENOL) 500 MG  tablet Take 2 tablets (1,000 mg total) by mouth every 8 (eight) hours. 06/10/19 06/09/20  Mercy Riding, MD  amLODipine (NORVASC) 5 MG tablet Take 1 tablet (5 mg total) by mouth daily. 05/17/19   Shelly Coss, MD  augmented betamethasone dipropionate (DIPROLENE-AF) 0.05 % cream Apply 1 application topically 2 (two) times daily as needed (flare ups).  05/08/18   [provider]  bisacodyl (DULCOLAX) 10 MG suppository Place 1 suppository (10 mg total) rectally as needed for moderate constipation. 06/18/19   Heath Lark, MD  cyclobenzaprine (FLEXERIL) 5 MG tablet Take 1 tablet (5 mg total) by mouth 3 (three) times daily as needed for muscle spasms. 06/18/19   Heath Lark, MD  lidocaine (LIDODERM) 5 % Place 1 patch onto the skin every 12 (twelve)  hours. Remove & Discard patch within 12 hours or as directed by MD 06/10/19 07/10/19  Mercy Riding, MD  methadone (DOLOPHINE) 10 MG tablet Take 2 tablets (20 mg total) by mouth every 8 (eight) hours. 06/30/19   Heath Lark, MD  oxyCODONE (OXY IR/ROXICODONE) 5 MG immediate release tablet Take 1 tablet (5 mg total) by mouth every 4 (four) hours as needed for severe pain. 06/16/19   Heath Lark, MD  polyethylene glycol (MIRALAX / GLYCOLAX) 17 g packet Take 17 g 1-3 times a day as needed for constipation 06/10/19   Wendee Beavers T, MD  predniSONE (DELTASONE) 20 MG tablet Take 1 tablet (20 mg total) by mouth daily with breakfast. 06/18/19   Heath Lark, MD  senna-docusate (SENOKOT-S) 8.6-50 MG tablet Take 1 tablet 1-2 times a day as needed for constipation 06/10/19   Mercy Riding, MD    Family History Family History  Problem Relation Age of Onset  . Cancer Brother        HODGKINS  . Cancer Brother        leukemia  . Parkinson's disease Brother   . Heart disease Mother   . Heart disease Sister     Social History Social History   Tobacco Use  . Smoking status: Former Smoker    Types: Cigarettes    Quit date: 03/26/1955    Years since quitting: 64.3  . Smokeless tobacco: Never Used  Substance Use Topics  . Alcohol use: Never    Frequency: Never  . Drug use: No     Allergies   Codeine and Levofloxacin   Review of Systems Review of Systems  Musculoskeletal: Positive for back pain.  All other systems reviewed and are negative.    Physical Exam Updated Vital Signs BP (!) 152/91 (BP Location: Left Arm)   Pulse (!) 107   Temp 97.7 F (36.5 C) (Oral)   Resp 15   Ht 4\' 10"  (1.473 m)   Wt 43.5 kg   LMP 03/26/1979   SpO2 96%   BMI 20.06 kg/m   Physical Exam Vitals signs and nursing note reviewed.    84 year old female, resting comfortably and in no acute distress. Vital signs are significant for mildly elevated blood pressure and heart rate. Oxygen saturation is 96%,  which is normal. Head is normocephalic and atraumatic. PERRLA, EOMI. Oropharynx is clear. Neck is nontender and supple without adenopathy or JVD. Back has significant tenderness over the mid lumbar area.  There is no CVA tenderness. Lungs are clear without rales, wheezes, or rhonchi. Chest is nontender. Heart has regular rate and rhythm without murmur. Abdomen is soft, flat, nontender without masses or hepatosplenomegaly and peristalsis is normoactive.  Extremities have no cyanosis or edema, full range of motion is present. Skin is warm and dry without rash. Neurologic: Mental status is normal, cranial nerves are intact, there are no motor or sensory deficits.  ED Treatments / Results   Procedures Procedures  Medications Ordered in ED Medications  HYDROmorphone (DILAUDID) injection 1 mg (1 mg Intravenous Given 07/03/19 0353)  ondansetron (ZOFRAN) injection 4 mg (4 mg Intravenous Given 07/03/19 0353)  ketorolac (TORADOL) 15 MG/ML injection 15 mg (15 mg Intravenous Given 07/03/19 0352)  HYDROmorphone (DILAUDID) injection 1 mg (1 mg Intravenous Given 07/03/19 0538)     Initial Impression / Assessment and Plan / ED Course  I have reviewed the triage vital signs and the nursing notes.  Low back pain likely secondary to exacerbation of known vertebral compression fractures.  Old records reviewed confirming history of vertebral compression fractures and current management for chronic lymphocytic leukemia.  On exam today, no red flags to suggest spinal cord involvement.  We will start IV and give her hydromorphone to see if she gets better pain relief.  Also, she is not on any NSAIDs and will give a dose of ketorolac.  Following above-noted treatment, there was improved pain control, but she do not feel comfortable going home.  He was able to ambulate without assistance.  She is given a 2nd dose of hydromorphone with much better control of pain.  She is discharged with prescription for  hydromorphone and is referred back to her primary care provider and her oncologist for outpatient pain management.  Her records in the controlled substance reporting website was reviewed confirming multiple recent narcotic prescriptions.   Final Clinical Impressions(s) / ED Diagnoses   Final diagnoses:  Midline low back pain without sciatica, unspecified chronicity  Closed compression fracture of lumbosacral spine with routine healing, subsequent encounter    ED Discharge Orders         Ordered    HYDROmorphone (DILAUDID) 2 MG tablet  Every 4 hours PRN     07/03/19 [id number]           Delora Fuel, MD [id number] [phone]

## 2019-07-07 ENCOUNTER — Inpatient Hospital Stay (HOSPITAL_BASED_OUTPATIENT_CLINIC_OR_DEPARTMENT_OTHER): Payer: Medicare Other | Admitting: Hematology and Oncology

## 2019-07-07 ENCOUNTER — Encounter: Payer: Self-pay | Admitting: Hematology and Oncology

## 2019-07-07 ENCOUNTER — Telehealth: Payer: Self-pay | Admitting: *Deleted

## 2019-07-07 DIAGNOSIS — C9110 Chronic lymphocytic leukemia of B-cell type not having achieved remission: Secondary | ICD-10-CM

## 2019-07-07 DIAGNOSIS — G893 Neoplasm related pain (acute) (chronic): Secondary | ICD-10-CM

## 2019-07-07 MED ORDER — HYDROMORPHONE HCL 4 MG PO TABS: ORAL_TABLET | ORAL | 0 refills | Status: DC

## 2019-07-07 NOTE — Assessment & Plan Note (Signed)
She has achieved complete hematological response with just 2 doses of bendamustine Due to her ongoing uncontrolled pain and other health issues, I will hold off further chemotherapy and focus on supportive care

## 2019-07-07 NOTE — Assessment & Plan Note (Signed)
She continues to have poorly controlled pain, leading to recent ER visit According to the patient, she felt better after intravenous Dilaudid Last week, while I was able to get her pain under controlled, she has made informed decision to discontinue methadone due to nausea I told the patient, the differences between long-acting pain medicine versus short acting pain medicine She has cycled through multiple different treatment including morphine, oxycodone, Dilaudid and methadone I told her most pain medicine can cause risk of nausea and constipation and there is no specific type that is better I recommend resumption of methadone I recommend her to take Compazine half an hour to an hour before methadone The specific instructions discussed included: 1) she will take schedule methadone 20 mg twice a day with premedication Compazine 30 minutes to an hour before she swallowed methadone 2) we will discontinue oxycodone 3) I refill prescription hydromorphone but increase the dose to 4 mg tablet.  She is allowed to take 1 to 2 tablets every 4-6 hours as needed for breakthrough pain 4) she is reminded to continue on daily MiraLAX and to take Senokot as needed to prevent constipation.  She is also allowed to take suppository as needed but I recommend she minimize using that due to risk of infection/trauma 5) she will keep her appointment for MRI next week as scheduled 6) I will make another E-visit next week on December 17 for further assessment  Per patient request, I will attempt to call her niece so that she will take her medications correctly

## 2019-07-07 NOTE — Progress Notes (Signed)
HEMATOLOGY-ONCOLOGY ELECTRONIC VISIT PROGRESS NOTE  Patient Care Team: Isaac Bliss, Rayford Halsted, MD as PCP - General (Internal Medicine) Jacqualine Mau Aletha Halim, NP as Nurse Practitioner [facility] and Palliative Medicine)  I connected with by [facility] video conference; however, due to technical issue, the E-visit became only a phone visit.  ASSESSMENT & PLAN:  CLL (chronic lymphocytic leukemia) ([place]) She has achieved complete hematological response with just 2 doses of bendamustine Due to her ongoing uncontrolled pain and other health issues, I will hold off further chemotherapy and focus on supportive care  Cancer associated pain She continues to have poorly controlled pain, leading to recent ER visit According to the patient, she felt better after intravenous Dilaudid Last week, while I was able to get her pain under controlled, she has made informed decision to discontinue methadone due to nausea I told the patient, the differences between long-acting pain medicine versus short acting pain medicine She has cycled through multiple different treatment including morphine, oxycodone, Dilaudid and methadone I told her most pain medicine can cause risk of nausea and constipation and there is no specific type that is better I recommend resumption of methadone I recommend her to take Compazine half an hour to an hour before methadone The specific instructions discussed included: 1) she will take schedule methadone 20 mg twice a day with premedication Compazine 30 minutes to an hour before she swallowed methadone 2) we will discontinue oxycodone 3) I refill prescription hydromorphone but increase the dose to 4 mg tablet.  She is allowed to take 1 to 2 tablets every 4-6 hours as needed for breakthrough pain 4) she is reminded to continue on daily MiraLAX and to take Senokot as needed to prevent constipation.  She is also allowed to take suppository as needed but I recommend she minimize using that due to risk  of infection/trauma 5) she will keep her appointment for MRI next week as scheduled 6) I will make another E-visit next week on December 17 for further assessment  Per patient request, I will attempt to call her niece so that she will take her medications correctly   No orders of the defined types were placed in this encounter.   INTERVAL HISTORY: Please see below for problem oriented charting.  I have reviewed the electronic records and noticed that the patient went to the emergency room recently due to poorly controlled pain Last week, she contacted my office due to nausea She blamed methadone as a cause of her nausea and would like to stop taking methadone if possible This unfortunately, has led to poorly controlled pain.  She was given intravenous Dilaudid in the emergency room with good pain relief and was prescribed 2 to 4 mg of Dilaudid to take as needed She is taking Dilaudid alternate with oxycodone without good relief She felt that her pain is constant She denies recent not severe or constipation  SUMMARY OF ONCOLOGIC HISTORY: Oncology History Overview Note  Del 13 q   CLL (chronic lymphocytic leukemia) ([place])  03/15/2009 Pathology Results   Case #: [id number]  flow cytometry of peripheral blood comfirmed CLL.   04/25/2017 Pathology Results   FISH panel is positive for deletion 13 q only   04/22/2019 Imaging   Ct scan of the chest, abdomen and pelvis 1. No adenopathy within the chest, abdomen or pelvis. 2. Splenomegaly with scattered low-density lesions measuring up to 1.3 cm. 3. Small subpleural nodule in the left lower lobe and right middle lobe measuring up to 5 mm.  No follow-up needed if patient is low-risk (and has no known or suspected primary neoplasm). Non-contrast chest CT can be considered in 12 months if patient is high-risk. This recommendation follows the consensus statement: Guidelines for Management of Incidental Pulmonary Nodules Detected on CT Images: From the  Fleischner Society 2017; Radiology 2017; 284:228-243. 4. Aortic Atherosclerosis (ICD10-I70.0). Coronary artery atherosclerotic calcifications.     04/23/2019 Cancer Staging   Staging form: Chronic Lymphocytic Leukemia / Small Lymphocytic Lymphoma, AJCC 8th Edition - Clinical stage from 04/23/2019: Modified Rai Stage IV (Modified Rai risk: High, Lymphocytosis: Present, Adenopathy: Absent, Organomegaly: Present, Anemia: Absent, Thrombocytopenia: Present) - Signed by Heath Lark, MD on 04/23/2019   04/28/2019 -  Chemotherapy   The patient had Ibrutinib for chemotherapy treatment.     06/01/2019 -  Chemotherapy   The patient had Bendamustine for chemotherapy   06/02/2019 Imaging   Ct head, chest, abdomen and pelvis 1. New T8 compression deformity with approximately 30% height loss. Worsening anterior compression deformity of the T11 vertebrae now with 20% height loss anteriorly. Additional T12 and L2 compression deformities are stable from prior. 2. No other acute traumatic injury within the chest, abdomen, or pelvis. Specifically, no splenic injury or retroperitoneal hematoma. 3. Mild intra and extrahepatic biliary ductal dilatation with a small calcified gallstone seen in the distal common bile duct just proximal to the ampulla of Vater. Correlate with LFTs and consider further evaluation with MRCP as clinically indicated. 4. Stable sub 5 mm nodules in the right middle and left lower lobe. No further evaluation is required if patient is low risk however should continue with previously recommended 12 month follow-up in September of 2021 if patient is high risk. 5. Severe scoliotic curvature of the thoracolumbar spine with associated chest wall deformity. 6. Duodenal and colonic diverticula without features of diverticulitis. 7. Moderate bladder distention, correlate for features of outlet obstruction or retention. 8. Aortic Atherosclerosis (ICD10-I70.0).   06/07/2019 - 06/10/2019 Hospital Admission     She was admitted for management of LUQ and back pain   06/08/2019 Imaging   MRI abdomen 1. Mild intra and extrahepatic biliary duct prominence with common bile duct measuring upper normal for patient age. No gallstones evident and no definite choledocholithiasis. Somewhat abrupt transition of the duct into the ampulla noted, but nonspecific. ERCP could be used to further evaluate for ampullary lesion as clinically warranted. 2. Hepatic and renal cysts. 3. Presumed marked distention of the urinary bladder.   06/08/2019 Imaging   Ct angiogram No gross evidence of large or central pulmonary emboli is identified on exam limited secondary to suboptimal pulmonary arterial opacification and respiratory motion.   Bibasilar atelectasis.   Osseous demineralization with compression fractures of several thoracic vertebra.   Aneurysmal dilatation ascending thoracic aorta 4.1 cm transverse, recommendation below.   Recommend annual imaging followup by CTA or MRA.   Aortic Atherosclerosis (ICD10-I70.0).   Aortic aneurysm NOS     REVIEW OF SYSTEMS:   Constitutional: Denies fevers, chills or abnormal weight loss Eyes: Denies blurriness of vision Ears, nose, mouth, throat, and face: Denies mucositis or sore throat Respiratory: Denies cough, dyspnea or wheezes Cardiovascular: Denies palpitation, chest discomfort Gastrointestinal:  Denies nausea, heartburn or change in bowel habits Skin: Denies abnormal skin rashes Lymphatics: Denies new lymphadenopathy or easy bruising Neurological:Denies numbness, tingling or new weaknesses Behavioral/Psych: Mood is stable, no new changes  Extremities: No lower extremity edema All other systems were reviewed with the patient and are negative.  I have reviewed the  past medical history, past surgical history, social history and family history with the patient and they are unchanged from previous note.  ALLERGIES:  is allergic to codeine and  levofloxacin.  MEDICATIONS:  Current Outpatient Medications  Medication Sig Dispense Refill   prochlorperazine (COMPAZINE) 5 MG tablet Take 5 mg by mouth every 6 (six) hours as needed.     acetaminophen (TYLENOL) 500 MG tablet Take 2 tablets (1,000 mg total) by mouth every 8 (eight) hours. 180 tablet 11   amLODipine (NORVASC) 5 MG tablet Take 1 tablet (5 mg total) by mouth daily. 30 tablet 0   augmented betamethasone dipropionate (DIPROLENE-AF) 0.05 % cream Apply 1 application topically 2 (two) times daily as needed (flare ups).   2   bisacodyl (DULCOLAX) 10 MG suppository Place 1 suppository (10 mg total) rectally as needed for moderate constipation. 12 suppository 0   cyclobenzaprine (FLEXERIL) 5 MG tablet Take 1 tablet (5 mg total) by mouth 3 (three) times daily as needed for muscle spasms. 30 tablet 0   HYDROmorphone (DILAUDID) 4 MG tablet Take 1 to 2 tablet as needed for breakthrough 60 tablet 0   lidocaine (LIDODERM) 5 % Place 1 patch onto the skin every 12 (twelve) hours. Remove & Discard patch within 12 hours or as directed by MD 60 patch 0   methadone (DOLOPHINE) 10 MG tablet Take 2 tablets (20 mg total) by mouth every 8 (eight) hours. 90 tablet 0   ondansetron (ZOFRAN) 4 MG tablet Take 4 mg by mouth every 8 (eight) hours as needed for nausea or vomiting.     polyethylene glycol (MIRALAX / GLYCOLAX) 17 g packet Take 17 g 1-3 times a day as needed for constipation 42 each 0   predniSONE (DELTASONE) 20 MG tablet Take 1 tablet (20 mg total) by mouth daily with breakfast. 30 tablet 0   senna-docusate (SENOKOT-S) 8.6-50 MG tablet Take 1 tablet 1-2 times a day as needed for constipation 90 tablet 0   No current facility-administered medications for this visit.     PHYSICAL EXAMINATION: ECOG PERFORMANCE STATUS: 3 - Symptomatic, >50% confined to bed  LABORATORY DATA:  I have reviewed the data as listed CMP Latest Ref Rng & Units 06/18/2019 06/10/2019 06/09/2019  Glucose 70 -  99 mg/dL 135(H) 107(H) 178(H)  BUN 8 - 23 mg/dL 10 14 15   Creatinine 0.44 - 1.00 mg/dL 0.73 0.74 0.73  Sodium 135 - 145 mmol/L 138 136 136  Potassium 3.5 - 5.1 mmol/L 4.0 4.1 4.5  Chloride 98 - 111 mmol/L 102 102 100  CO2 22 - 32 mmol/L 26 27 26   Calcium 8.9 - 10.3 mg/dL 9.3 9.2 9.2  Total Protein 6.5 - 8.1 g/dL 6.5 - -  Total Bilirubin 0.3 - 1.2 mg/dL 1.0 - -  Alkaline Phos 38 - 126 U/L 182(H) - -  AST 15 - 41 U/L 12(L) - -  ALT 0 - 44 U/L 30 - -    Lab Results  Component Value Date   WBC 8.6 06/18/2019   HGB 11.6 (L) 06/18/2019   HCT 34.8 (L) 06/18/2019   MCV 105.8 (H) 06/18/2019   PLT 159 06/18/2019   NEUTROABS 4.7 06/18/2019     RADIOGRAPHIC STUDIES: I have personally reviewed the radiological images as listed and agreed with the findings in the report. Dg Chest 2 View  Result Date: 06/07/2019 CLINICAL DATA:  Chest pain EXAM: CHEST - 2 VIEW COMPARISON:  June 02, 2019 FINDINGS: The cardiomediastinal silhouette is unchanged from prior  exam with mild cardiomegaly. There is opacity in the right upper lung which is likely due to overlap of shadows from positioning. The left lung is clear. No pleural effusion or focal airspace consolidation. Again noted is diffuse osteopenia and scoliotic curvature. IMPRESSION: Opacity in the right upper lung which is likely thought to be due to overlap of shadows. If clinical concern remains would recommend repeat radiograph. Electronically Signed   By: Prudencio Pair M.D.   On: 06/07/2019 22:13   Ct Angio Chest Pe W Or Wo Contrast  Result Date: 06/08/2019 CLINICAL DATA:  Elevated D-dimer, pain, history CLL, hypertension EXAM: CT ANGIOGRAPHY CHEST WITH CONTRAST TECHNIQUE: Multidetector CT imaging of the chest was performed using the standard protocol during bolus administration of intravenous contrast. Multiplanar CT image reconstructions and MIPs were obtained to evaluate the vascular anatomy. CONTRAST:  178mL OMNIPAQUE IOHEXOL 350 MG/ML SOLN IV  COMPARISON:  CT chest 06/02/2019 FINDINGS: Cardiovascular: Atherosclerotic calcifications aorta, proximal great vessels, and minimally in coronary arteries. Aneurysmal dilatation of ascending thoracic aorta 4.1 cm transverse. No evidence of aortic dissection. Pulmonary arteries are suboptimally opacified as well as limited by respiratory motion. No definite large or central pulmonary emboli are visualized. Unable to exclude small and peripheral emboli by this study. No pericardial effusion. Enlargement of cardiac chambers. Mediastinum/Nodes: Esophagus unremarkable. Base of cervical region normal appearance. Lungs/Pleura: Bibasilar atelectasis in the lower lobes. Remaining lungs grossly clear. No pleural effusion or pneumothorax. Upper Abdomen: Spleen appears prominent in size but incompletely visualized. Remaining visualized upper abdomen unremarkable Musculoskeletal: Significant cervicothoracic scoliosis. Compression deformities of several thoracic vertebra are again seen. Review of the MIP images confirms the above findings. IMPRESSION: No gross evidence of large or central pulmonary emboli is identified on exam limited secondary to suboptimal pulmonary arterial opacification and respiratory motion. Bibasilar atelectasis. Osseous demineralization with compression fractures of several thoracic vertebra. Aneurysmal dilatation ascending thoracic aorta 4.1 cm transverse, recommendation below. Recommend annual imaging followup by CTA or MRA. This recommendation follows 2010 ACCF/AHA/AATS/ACR/ASA/SCA/SCAI/SIR/STS/SVM Guidelines for the Diagnosis and Management of Patients with Thoracic Aortic Disease. Circulation. 2010; [id number]. Aortic aneurysm NOS (ICD10-I71.9) Aortic Atherosclerosis (ICD10-I70.0). Aortic aneurysm NOS (ICD10-I71.9).  For Electronically Signed   By: Lavonia Dana M.D.   On: 06/08/2019 17:12   US Abdomen Complete  Result Date: 06/08/2019 CLINICAL DATA:  Abdominal distention. EXAM: ABDOMEN  ULTRASOUND COMPLETE COMPARISON:  CT 06/02/2019. FINDINGS: Gallbladder: No gallstones or wall thickening visualized. No sonographic Murphy sign noted by sonographer. Common bile duct: Diameter: 8.5 mm. Intrahepatic biliary ductal dilatation also noted. Distal common bile duct stone suspected on prior CT. Again MRCP may prove useful for further evaluation. Liver: No focal lesion identified. Within normal limits in parenchymal echogenicity. Portal vein is patent on color Doppler imaging with normal direction of blood flow towards the liver. IVC: No abnormality visualized. Pancreas: Visualized portion unremarkable. Spleen: Size and appearance within normal limits. Right Kidney: Length: 8.6 cm. Echogenicity within normal limits. No mass or hydronephrosis visualized. Left Kidney: Length: 9.5 cm. Echogenicity within normal limits. No mass or hydronephrosis visualized. Abdominal aorta: No aneurysm visualized. Other findings: No ascites. IMPRESSION: 1. Common bile duct is dilated 8.5 mm. Intrahepatic biliary ductal dilatation also noted. Distal common bile duct stone suspected on prior CT of 06/02/2019. Again MRCP may prove useful for further evaluation. 2.  No other abnormality identified.  No ascites. Electronically Signed   By: Marcello Moores  Register   On: 06/08/2019 07:05   Mr 3d Recon At Scanner  Result Date: 06/09/2019  CLINICAL DATA:  Abnormal liver function tests with cholelithiasis. EXAM: MRI ABDOMEN WITHOUT AND WITH CONTRAST (INCLUDING MRCP) TECHNIQUE: Multiplanar multisequence MR imaging of the abdomen was performed both before and after the administration of intravenous contrast. Heavily T2-weighted images of the biliary and pancreatic ducts were obtained, and three-dimensional MRCP images were rendered by post processing. CONTRAST:  3mL GADAVIST GADOBUTROL 1 MMOL/ML IV SOLN COMPARISON:  CT abdomen/pelvis 06/02/2019. FINDINGS: Lower chest: The heart is enlarged. Hepatobiliary: 7 mm simple cyst is identified in the  anterior liver no suspicious enhancing liver abnormality. Liver measures 16.2 cm craniocaudal length, upper normal. Gallbladder is distended no evidence for gallstones by MRI. Mild intrahepatic biliary duct prominence. Common bile duct in the head of the pancreas measures 7 mm diameter which is upper normal for patient age. The common bile duct shows rather abrupt transition at the ampulla, but no definite evidence for choledocholithiasis. Pancreas: No focal mass lesion. Main pancreatic duct measures 3 mm diameter, upper normal. No intraparenchymal cyst. No peripancreatic edema. Spleen: Spleen is 12.4 cm craniocaudal length. No focal abnormality. Adrenals/Urinary Tract: No adrenal nodule or mass. No suspicious enhancing abnormality in either kidney. Tiny cortical T2 hyperintensities in the cortex of the kidneys without definite enhancement suggest tiny cortical cyst. Central sinus cysts are noted in the kidneys bilaterally, left greater than right. Stomach/Bowel: Stomach is unremarkable. No gastric wall thickening. No evidence of outlet obstruction. Duodenum is normally positioned as is the ligament of Treitz. No small bowel or colonic dilatation within the visualized abdomen. Vascular/Lymphatic: No abdominal aortic aneurysm. No abdominal lymphadenopathy Other: Fluid-filled structure in the central pelvis is been incompletely visualized but is presumably a markedly dilated urinary bladder. Musculoskeletal: No abnormal marrow enhancement within the visualized bony anatomy IMPRESSION: 1. Mild intra and extrahepatic biliary duct prominence with common bile duct measuring upper normal for patient age. No gallstones evident and no definite choledocholithiasis. Somewhat abrupt transition of the duct into the ampulla noted, but nonspecific. ERCP could be used to further evaluate for ampullary lesion as clinically warranted. 2. Hepatic and renal cysts. 3. Presumed marked distention of the urinary bladder. Electronically  Signed   By: Misty Stanley M.D.   On: 06/09/2019 07:58   Mr Abdomen Mrcp Moise Boring Contast  Result Date: 06/09/2019 CLINICAL DATA:  Abnormal liver function tests with cholelithiasis. EXAM: MRI ABDOMEN WITHOUT AND WITH CONTRAST (INCLUDING MRCP) TECHNIQUE: Multiplanar multisequence MR imaging of the abdomen was performed both before and after the administration of intravenous contrast. Heavily T2-weighted images of the biliary and pancreatic ducts were obtained, and three-dimensional MRCP images were rendered by post processing. CONTRAST:  77mL GADAVIST GADOBUTROL 1 MMOL/ML IV SOLN COMPARISON:  CT abdomen/pelvis 06/02/2019. FINDINGS: Lower chest: The heart is enlarged. Hepatobiliary: 7 mm simple cyst is identified in the anterior liver no suspicious enhancing liver abnormality. Liver measures 16.2 cm craniocaudal length, upper normal. Gallbladder is distended no evidence for gallstones by MRI. Mild intrahepatic biliary duct prominence. Common bile duct in the head of the pancreas measures 7 mm diameter which is upper normal for patient age. The common bile duct shows rather abrupt transition at the ampulla, but no definite evidence for choledocholithiasis. Pancreas: No focal mass lesion. Main pancreatic duct measures 3 mm diameter, upper normal. No intraparenchymal cyst. No peripancreatic edema. Spleen: Spleen is 12.4 cm craniocaudal length. No focal abnormality. Adrenals/Urinary Tract: No adrenal nodule or mass. No suspicious enhancing abnormality in either kidney. Tiny cortical T2 hyperintensities in the cortex of the kidneys without definite enhancement suggest  tiny cortical cyst. Central sinus cysts are noted in the kidneys bilaterally, left greater than right. Stomach/Bowel: Stomach is unremarkable. No gastric wall thickening. No evidence of outlet obstruction. Duodenum is normally positioned as is the ligament of Treitz. No small bowel or colonic dilatation within the visualized abdomen. Vascular/Lymphatic: No  abdominal aortic aneurysm. No abdominal lymphadenopathy Other: Fluid-filled structure in the central pelvis is been incompletely visualized but is presumably a markedly dilated urinary bladder. Musculoskeletal: No abnormal marrow enhancement within the visualized bony anatomy IMPRESSION: 1. Mild intra and extrahepatic biliary duct prominence with common bile duct measuring upper normal for patient age. No gallstones evident and no definite choledocholithiasis. Somewhat abrupt transition of the duct into the ampulla noted, but nonspecific. ERCP could be used to further evaluate for ampullary lesion as clinically warranted. 2. Hepatic and renal cysts. 3. Presumed marked distention of the urinary bladder. Electronically Signed   By: Misty Stanley M.D.   On: 06/09/2019 07:58   Vas [place] Lower Extremity Venous (dvt)  Result Date: 06/09/2019  Lower Venous Study Indications: Swelling.  Comparison Study: no prior Performing Technologist: Abram Sander RVS  Examination Guidelines: A complete evaluation includes B-mode imaging, spectral Doppler, color Doppler, and power Doppler as needed of all accessible portions of each vessel. Bilateral testing is considered an integral part of a complete examination. Limited examinations for reoccurring indications may be performed as noted.  +---------+---------------+---------+-----------+----------+--------------+  RIGHT     Compressibility Phasicity Spontaneity Properties Thrombus Aging  +---------+---------------+---------+-----------+----------+--------------+  CFV       Full            Yes       Yes                                    +---------+---------------+---------+-----------+----------+--------------+  SFJ       Full                                                             +---------+---------------+---------+-----------+----------+--------------+  FV Prox   Full                                                              +---------+---------------+---------+-----------+----------+--------------+  FV Mid    Full                                                             +---------+---------------+---------+-----------+----------+--------------+  FV Distal Full                                                             +---------+---------------+---------+-----------+----------+--------------+  PFV       Full                                                             +---------+---------------+---------+-----------+----------+--------------+  POP       Full            Yes       Yes                                    +---------+---------------+---------+-----------+----------+--------------+  PTV       Full                                                             +---------+---------------+---------+-----------+----------+--------------+  PERO      Full                                                             +---------+---------------+---------+-----------+----------+--------------+   +---------+---------------+---------+-----------+----------+--------------+  LEFT      Compressibility Phasicity Spontaneity Properties Thrombus Aging  +---------+---------------+---------+-----------+----------+--------------+  CFV       Full            Yes       Yes                                    +---------+---------------+---------+-----------+----------+--------------+  SFJ       Full                                                             +---------+---------------+---------+-----------+----------+--------------+  FV Prox   Full                                                             +---------+---------------+---------+-----------+----------+--------------+  FV Mid    Full                                                             +---------+---------------+---------+-----------+----------+--------------+  FV Distal Full                                                              +---------+---------------+---------+-----------+----------+--------------+  PFV       Full                                                             +---------+---------------+---------+-----------+----------+--------------+  POP       Full            Yes       Yes                                    +---------+---------------+---------+-----------+----------+--------------+  PTV       Full                                                             +---------+---------------+---------+-----------+----------+--------------+  PERO      Full                                                             +---------+---------------+---------+-----------+----------+--------------+     Summary: Right: There is no evidence of deep vein thrombosis in the lower extremity. No cystic structure found in the popliteal fossa. Left: There is no evidence of deep vein thrombosis in the lower extremity. No cystic structure found in the popliteal fossa.  *See table(s) above for measurements and observations. Electronically signed by Harold Barban MD on 06/09/2019 at 5:51:57 PM.    Final    Ir Radiologist Eval & Mgmt  Result Date: 07/01/2019 Please refer to notes tab for details about interventional procedure. (Op Note)   I discussed the assessment and treatment plan with the patient. The patient was provided an opportunity to ask questions and all were answered. The patient agreed with the plan and demonstrated an understanding of the instructions. The patient was advised to call back or seek an in-person evaluation if the symptoms worsen or if the condition fails to improve as anticipated.    I spent 15 minutes counseling the patient face to face. The total time spent in the appointment was 20 minutes and more than 50% was on counseling and review of test results  Heath Lark, MD 07/07/2019 2:43 PM

## 2019-07-07 NOTE — Telephone Encounter (Signed)
Telephone call to April [name], discussed in detail new pain management regimen as instructed in office visit 12/8. April [name] repeated back directions. She will confirm Compazine does not need a refill, she was not familiar with patient having this prescription. She will call us with any updates or refill needs.   She did ask about continuing Flexeril PRN for muscle spasms. Advised her to not take this medication until confirmation can be made with Dr. Alvy Bimler, this was not listed in the pain management note. Advised her I would call tomorrow with this information.   April [name] confirms appts and understands to call us with any further concerns.

## 2019-07-08 ENCOUNTER — Encounter (HOSPITAL_COMMUNITY): Payer: Self-pay

## 2019-07-08 ENCOUNTER — Other Ambulatory Visit: Payer: Self-pay

## 2019-07-08 ENCOUNTER — Telehealth (INDEPENDENT_AMBULATORY_CARE_PROVIDER_SITE_OTHER): Payer: Medicare Other | Admitting: Internal Medicine

## 2019-07-08 ENCOUNTER — Emergency Department (HOSPITAL_COMMUNITY): Payer: Medicare Other

## 2019-07-08 ENCOUNTER — Emergency Department (HOSPITAL_COMMUNITY)
Admission: EM | Admit: 2019-07-08 | Discharge: 2019-07-09 | Disposition: A | Discharge status: Home / Self Care | Payer: Medicare Other | Attending: Emergency Medicine | Admitting: Emergency Medicine

## 2019-07-08 DIAGNOSIS — Z87891 Personal history of nicotine dependence: Secondary | ICD-10-CM | POA: Insufficient documentation

## 2019-07-08 DIAGNOSIS — R4182 Altered mental status, unspecified: Secondary | ICD-10-CM

## 2019-07-08 DIAGNOSIS — R410 Disorientation, unspecified: Secondary | ICD-10-CM

## 2019-07-08 DIAGNOSIS — S22060G Wedge compression fracture of T7-T8 vertebra, subsequent encounter for fracture with delayed healing: Secondary | ICD-10-CM

## 2019-07-08 DIAGNOSIS — R5383 Other fatigue: Secondary | ICD-10-CM | POA: Diagnosis present

## 2019-07-08 DIAGNOSIS — M549 Dorsalgia, unspecified: Secondary | ICD-10-CM

## 2019-07-08 DIAGNOSIS — Z79899 Other long term (current) drug therapy: Secondary | ICD-10-CM | POA: Insufficient documentation

## 2019-07-08 DIAGNOSIS — Z853 Personal history of malignant neoplasm of breast: Secondary | ICD-10-CM | POA: Diagnosis not present

## 2019-07-08 DIAGNOSIS — R404 Transient alteration of awareness: Secondary | ICD-10-CM | POA: Diagnosis not present

## 2019-07-08 MED ORDER — SODIUM CHLORIDE 0.9 % IV SOLN
INTRAVENOUS | Status: DC
  Administered 2019-07-09: 02:00:00 via INTRAVENOUS

## 2019-07-08 MED ORDER — SODIUM CHLORIDE 0.9 % IV BOLUS
1000.0000 mL | Freq: Once | INTRAVENOUS | Status: AC
  Administered 2019-07-09: 1000 mL via INTRAVENOUS

## 2019-07-08 NOTE — Telephone Encounter (Signed)
I think she had trouble refilling her prescription yesterday Can you call today to follow?

## 2019-07-08 NOTE — ED Triage Notes (Signed)
Pt arrived via [facility] from home due to weakness, dizziness, and confusion . Pt has Hx of HTN. Pt has been unable to intake fluids or food in last 2 days. EMS administered 2L O2 nasal cannula and 537mL NS en route to hospital.   HR 125 BP 167/105 Temp 97.1 RR 20  CBG 164 22g R-AC

## 2019-07-08 NOTE — Telephone Encounter (Signed)
Telephone call to patient- she states she was able to get her prescription last night. There was an issue with insurance. The pharmacy had the wrong insurance card. It was not an issue related to the prescription itself. Patient states she is feeling very well this morning. She understands to call us with any concerns or questions.

## 2019-07-08 NOTE — ED Provider Notes (Signed)
[facility] DEPT Provider Note   CSN: [id number] Arrival date & time: 07/08/19  2202     History   Chief Complaint Chief Complaint  Patient presents with  . Altered Mental Status  . Weakness  . Dizziness    HPI April [name] is a 84 y.o. female.     HPI Patient with multiple medical issues including CLL, chronic back pain presents with fatigue, generalized discomfort, and confusion. Onset seems to been earlier today, though no clear precipitant, nor exact time of onset is available. Initially she is alone, but she was joined by her husband, who corroborates details of the HPI. She knowledges her substantial medical history, but states that she was doing generally well until prior to onset of illness. Since that time she has had difficulty putting other thoughts, words, with generalized discomfort, without focal chest pain, headache, belly pain, nausea, vomiting, fever. No new weakness in any extremity, though she notes that she feels weak all over. No recent medication change, diet change, activity change. Past Medical History:  Diagnosis Date  . Breast CA ([place])   . Bronchitis, chronic ([place])   . Eye hemorrhage, left   . Leukemia, chronic lymphoid ([place])   . 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 Osteoporosis 05/2018   T score -3.1 overall stable from prior study    Patient Active Problem List   Diagnosis Date Noted  . Other constipation 06/18/2019  . Physical debility 06/18/2019  . Abdominal distension 06/08/2019  . Severe pain 06/08/2019  . Intractable back pain 06/08/2019  . Closed wedge compression fracture of T8 vertebra ([place]) 06/07/2019  . Sinus tachycardia 06/07/2019  . Intractable pain 06/07/2019  . Goals of care, counseling/discussion 05/14/2019  . Abnormal liver enzymes 05/14/2019  . Dehydration 05/14/2019  . Failure to thrive in adult 05/14/2019  . Abdominal pain 04/16/2019  . Cancer associated  pain 04/16/2019  . Breast cancer ([place]) 06/19/2018  . Skin lesion 05/06/2017  . Essential hypertension 12/14/2016  . Systolic hypertension, isolated 11/16/2016  . Central retinal vein occlusion 10/18/2016  . Preventive measure 04/28/2015  . Thrombocytopenia ([place]) 04/28/2015  . Idiopathic scoliosis 10/23/2012  . CLL (chronic lymphocytic leukemia) ([place]) 03/02/2009  . VENOUS INSUFFICIENCY, CHRONIC 10/28/2008  . Dyslipidemia 10/27/2007  . Osteoporosis 10/27/2007  . SKIN CANCER, HX OF 10/27/2007  . DIVERTICULITIS, HX OF 04/08/2007    Past Surgical History:  Procedure Laterality Date  . ABDOMINAL HYSTERECTOMY  1980  . BREAST IMPLANTS REMOVED  2001  . BREAST SURGERY     Bilateral mastectomy  . IR RADIOLOGIST EVAL & MGMT  07/01/2019  . MASTECTOMY  1982   BILATERAL  . OOPHORECTOMY     BSO  . PARTIAL COLECTOMY     INTESTINAL ABSCESS WITH SALPINGECTOMY  . RECONSTRUCTION LEFT ELBOW    . TONSILLECTOMY    . UMBILLICAL HERNIA REPAIR       OB History    Gravida  0   Para  0   Term      Preterm      AB      Living        SAB      TAB      Ectopic      Multiple      Live Births               Home Medications    Prior to Admission medications   Medication Sig Start Date  End Date Taking? Authorizing Provider  acetaminophen (TYLENOL) 500 MG tablet Take 2 tablets (1,000 mg total) by mouth every 8 (eight) hours. 06/10/19 06/09/20  Mercy Riding, MD  amLODipine (NORVASC) 5 MG tablet Take 1 tablet (5 mg total) by mouth daily. 05/17/19   Shelly Coss, MD  augmented betamethasone dipropionate (DIPROLENE-AF) 0.05 % cream Apply 1 application topically 2 (two) times daily as needed (flare ups).  05/08/18   [provider]  bisacodyl (DULCOLAX) 10 MG suppository Place 1 suppository (10 mg total) rectally as needed for moderate constipation. 06/18/19   Heath Lark, MD  cyclobenzaprine (FLEXERIL) 5 MG tablet Take 1 tablet (5 mg total) by mouth 3 (three) times daily  as needed for muscle spasms. 06/18/19   Heath Lark, MD  HYDROmorphone (DILAUDID) 4 MG tablet Take 1 to 2 tablet as needed for breakthrough 07/07/19   Heath Lark, MD  lidocaine (LIDODERM) 5 % Place 1 patch onto the skin every 12 (twelve) hours. Remove & Discard patch within 12 hours or as directed by MD 06/10/19 07/10/19  Mercy Riding, MD  methadone (DOLOPHINE) 10 MG tablet Take 2 tablets (20 mg total) by mouth every 8 (eight) hours. 06/30/19   Heath Lark, MD  ondansetron (ZOFRAN) 4 MG tablet Take 4 mg by mouth every 8 (eight) hours as needed for nausea or vomiting.    [provider]  polyethylene glycol (MIRALAX / GLYCOLAX) 17 g packet Take 17 g 1-3 times a day as needed for constipation 06/10/19   Wendee Beavers T, MD  predniSONE (DELTASONE) 20 MG tablet Take 1 tablet (20 mg total) by mouth daily with breakfast. 06/18/19   Heath Lark, MD  prochlorperazine (COMPAZINE) 5 MG tablet Take 5 mg by mouth every 6 (six) hours as needed.    [provider]  senna-docusate (SENOKOT-S) 8.6-50 MG tablet Take 1 tablet 1-2 times a day as needed for constipation 06/10/19   Mercy Riding, MD    Family History Family History  Problem Relation Age of Onset  . Cancer Brother        HODGKINS  . Cancer Brother        leukemia  . Parkinson's disease Brother   . Heart disease Mother   . Heart disease Sister     Social History Social History   Tobacco Use  . Smoking status: Former Smoker    Types: Cigarettes    Quit date: 03/26/1955    Years since quitting: 64.3  . Smokeless tobacco: Never Used  Substance Use Topics  . Alcohol use: Never    Frequency: Never  . Drug use: No     Allergies   Codeine and Levofloxacin   Review of Systems Review of Systems  Constitutional:       Per HPI, otherwise negative  HENT:       Per HPI, otherwise negative  Respiratory:       Per HPI, otherwise negative  Cardiovascular:       Per HPI, otherwise negative  Gastrointestinal: Negative for  vomiting.  Endocrine:       Negative aside from HPI  Genitourinary:       Neg aside from HPI   Musculoskeletal:       Per HPI, otherwise negative  Skin: Negative.   Allergic/Immunologic: Positive for immunocompromised state.  Neurological: Positive for weakness and light-headedness. Negative for syncope.     Physical Exam Updated Vital Signs BP (!) 155/94 (BP Location: Right Arm)   Pulse (!) 121  Temp 98.2 F (36.8 C) (Oral)   Resp 18   Ht 4\' 10"  (1.473 m)   Wt 44 kg   LMP 03/26/1979   SpO2 97%   BMI 20.27 kg/m   Physical Exam Vitals signs and nursing note reviewed.  Constitutional:      General: She is not in acute distress.    Appearance: She is well-developed. She is ill-appearing.     Comments: Frail, ill-appearing elderly female intermittently verbal, when she speaks it is clear, appropriate.  HENT:     Head: Normocephalic and atraumatic.  Eyes:     Conjunctiva/sclera: Conjunctivae normal.  Cardiovascular:     Rate and Rhythm: Regular rhythm. Tachycardia present.  Pulmonary:     Effort: Pulmonary effort is normal. No respiratory distress.     Breath sounds: Normal breath sounds. No stridor.  Abdominal:     General: There is no distension.  Skin:    General: Skin is warm and dry.  Neurological:     Mental Status: She is alert.     Cranial Nerves: No cranial nerve deficit.     Motor: Atrophy present. No tremor, abnormal muscle tone or pronator drift.     Coordination: Coordination normal.  Psychiatric:        Behavior: Behavior is slowed and withdrawn.        Cognition and Memory: Cognition is impaired.      ED Treatments / Results  Labs (all labs ordered are listed, but only abnormal results are displayed) Labs Reviewed  AMMONIA  COMPREHENSIVE METABOLIC PANEL  LACTIC ACID, PLASMA  ETHANOL  CBC WITH DIFFERENTIAL/PLATELET  URINALYSIS, ROUTINE W REFLEX MICROSCOPIC    EKG None  Radiology No results found.  Procedures Procedures (including  critical care time)  Medications Ordered in ED Medications  sodium chloride 0.9 % bolus 1,000 mL (has no administration in time range)    And  0.9 %  sodium chloride infusion (has no administration in time range)     Initial Impression / Assessment and Plan / ED Course  I have reviewed the triage vital signs and the nursing notes.  Pertinent labs & imaging results that were available during my care of the patient were reviewed by me and considered in my medical decision making (see chart for details).    Initial head CT, chest x-ray both unremarkable, vital signs remain unremarkable. On signout, the patient has labs, urinalysis pending.    This elderly female presents with generalized weakness, confusion Patient is here with her husband who assists with the HPI. She does have a history of similar prior events, but had been in her usual state of health until onset. Patient has no focal neurologic deficiencies, beyond slow interaction, slow speech. Patient is afebrile, hemodynamically unremarkable, some suspicion for metabolic changes, low suspicion for stroke.  On signout patient has multiple labs pending, will require repeat evaluation, Dr. Florina Ou is aware of the patient.  Final Clinical Impressions(s) / ED Diagnoses   Final diagnoses:  Delirium     Carmin Muskrat, MD 07/09/19 [phone]

## 2019-07-08 NOTE — Progress Notes (Signed)
Virtual Visit via Telephone Note  I connected with April [name] on 07/08/19 at  3:15 PM EST by telephone and verified that I am speaking with the correct person using two identifiers.   I discussed the limitations, risks, security and privacy concerns of performing an evaluation and management service by telephone and the availability of in person appointments. I also discussed with the patient that there may be a patient responsible charge related to this service. The patient expressed understanding and agreed to proceed.  Location patient: home Location provider: work office Participants present for the call: patient, provider Patient did not have a visit in the prior 7 days to address this/these issue(s).   History of Present Illness:  April [name] has a history of CLL followed by Dr. Alvy Bimler that is in remission.  Unfortunately earlier this year she developed an acute T8 vertebral compression fracture that has caused intractable back pain.  She has had several ED visits and hospitalizations for this.  She coincidentally saw her oncologist yesterday who started her on methadone 20 mg twice a day scheduled with Compazine 30 minutes prior for nausea prevention in addition to Dilaudid every 4 hours as needed for breakthrough pain.  She was also advised to continue MiraLAX and Senokot to prevent opioid-induced constipation.  So far April [name] has only taken the methadone twice and she feels like it is working remarkably well, she has very minimal pain, has not yet had to use her Dilaudid.  Unfortunately she does feel a little shaky and wonders if the methadone dose may need to be decreased.   Observations/Objective: Patient sounds cheerful and well on the phone. I do not appreciate any increased work of breathing. Speech and thought processing are grossly intact. Patient reported vitals: None reported   Current Outpatient Medications:  .  acetaminophen (TYLENOL) 500 MG tablet, Take 2  tablets (1,000 mg total) by mouth every 8 (eight) hours., Disp: 180 tablet, Rfl: 11 .  amLODipine (NORVASC) 5 MG tablet, Take 1 tablet (5 mg total) by mouth daily., Disp: 30 tablet, Rfl: 0 .  augmented betamethasone dipropionate (DIPROLENE-AF) 0.05 % cream, Apply 1 application topically 2 (two) times daily as needed (flare ups). , Disp: , Rfl: 2 .  bisacodyl (DULCOLAX) 10 MG suppository, Place 1 suppository (10 mg total) rectally as needed for moderate constipation., Disp: 12 suppository, Rfl: 0 .  cyclobenzaprine (FLEXERIL) 5 MG tablet, Take 1 tablet (5 mg total) by mouth 3 (three) times daily as needed for muscle spasms., Disp: 30 tablet, Rfl: 0 .  HYDROmorphone (DILAUDID) 4 MG tablet, Take 1 to 2 tablet as needed for breakthrough, Disp: 60 tablet, Rfl: 0 .  lidocaine (LIDODERM) 5 %, Place 1 patch onto the skin every 12 (twelve) hours. Remove & Discard patch within 12 hours or as directed by MD, Disp: 60 patch, Rfl: 0 .  methadone (DOLOPHINE) 10 MG tablet, Take 2 tablets (20 mg total) by mouth every 8 (eight) hours., Disp: 90 tablet, Rfl: 0 .  ondansetron (ZOFRAN) 4 MG tablet, Take 4 mg by mouth every 8 (eight) hours as needed for nausea or vomiting., Disp: , Rfl:  .  polyethylene glycol (MIRALAX / GLYCOLAX) 17 g packet, Take 17 g 1-3 times a day as needed for constipation, Disp: 42 each, Rfl: 0 .  predniSONE (DELTASONE) 20 MG tablet, Take 1 tablet (20 mg total) by mouth daily with breakfast., Disp: 30 tablet, Rfl: 0 .  prochlorperazine (COMPAZINE) 5 MG tablet, Take 5  mg by mouth every 6 (six) hours as needed., Disp: , Rfl:  .  senna-docusate (SENOKOT-S) 8.6-50 MG tablet, Take 1 tablet 1-2 times a day as needed for constipation, Disp: 90 tablet, Rfl: 0  Review of Systems:  Constitutional: Denies fever, chills, diaphoresis, appetite change and fatigue.  HEENT: Denies photophobia, eye pain, redness, hearing loss, ear pain, congestion, sore throat, rhinorrhea, sneezing, mouth sores, trouble  swallowing, neck pain, neck stiffness and tinnitus.   Respiratory: Denies SOB, DOE, cough, chest tightness,  and wheezing.   Cardiovascular: Denies chest pain, palpitations and leg swelling.  Gastrointestinal: Denies nausea, vomiting, abdominal pain, diarrhea, constipation, blood in stool and abdominal distention.  Genitourinary: Denies dysuria, urgency, frequency, hematuria, flank pain and difficulty urinating.  Endocrine: Denies: hot or cold intolerance, sweats, changes in hair or nails, polyuria, polydipsia. Musculoskeletal: Denies  joint swelling.  Skin: Denies pallor, rash and wound.  Neurological: Denies dizziness, seizures, syncope, weakness, light-headedness, numbness and headaches.  Hematological: Denies adenopathy. Easy bruising, personal or family bleeding history  Psychiatric/Behavioral: Denies suicidal ideation, mood changes, confusion, nervousness, sleep disturbance and agitation   Assessment and Plan:  Intractable back pain Closed wedge compression fracture of T8 vertebra with delayed healing, subsequent encounter  -Have advised that she continue current narcotic plan devised by Dr. Alvy Bimler.  She already has a follow-up appointment scheduled with her on 12/17. -She can reach out to me if she has any questions in the meantime.  I discussed the assessment and treatment plan with the patient. The patient was provided an opportunity to ask questions and all were answered. The patient agreed with the plan and demonstrated an understanding of the instructions.   The patient was advised to call back or seek an in-person evaluation if the symptoms worsen or if the condition fails to improve as anticipated.  I provided 12 minutes of non-face-to-face time during this encounter.   Lelon Frohlich, MD [facility]

## 2019-07-09 ENCOUNTER — Other Ambulatory Visit: Payer: Self-pay

## 2019-07-09 ENCOUNTER — Telehealth: Payer: Self-pay | Admitting: Surgery

## 2019-07-09 ENCOUNTER — Telehealth: Payer: Self-pay | Admitting: *Deleted

## 2019-07-09 ENCOUNTER — Emergency Department (HOSPITAL_COMMUNITY): Payer: Medicare Other

## 2019-07-09 ENCOUNTER — Encounter (HOSPITAL_COMMUNITY): Payer: Self-pay

## 2019-07-09 ENCOUNTER — Ambulatory Visit: Payer: Medicare Other | Admitting: Internal Medicine

## 2019-07-09 ENCOUNTER — Encounter: Payer: Self-pay | Admitting: Internal Medicine

## 2019-07-09 ENCOUNTER — Emergency Department (HOSPITAL_COMMUNITY)
Admission: EM | Admit: 2019-07-09 | Discharge: 2019-07-09 | Disposition: A | Discharge status: Home / Self Care | Payer: Medicare Other | Source: Home / Self Care | Attending: Emergency Medicine | Admitting: Emergency Medicine

## 2019-07-09 VITALS — BP 140/80 | HR 117 | Temp 98.0°F

## 2019-07-09 DIAGNOSIS — Z85828 Personal history of other malignant neoplasm of skin: Secondary | ICD-10-CM | POA: Insufficient documentation

## 2019-07-09 DIAGNOSIS — S22060G Wedge compression fracture of T7-T8 vertebra, subsequent encounter for fracture with delayed healing: Secondary | ICD-10-CM

## 2019-07-09 DIAGNOSIS — S0990XA Unspecified injury of head, initial encounter: Secondary | ICD-10-CM | POA: Insufficient documentation

## 2019-07-09 DIAGNOSIS — W010XXA Fall on same level from slipping, tripping and stumbling without subsequent striking against object, initial encounter: Secondary | ICD-10-CM | POA: Insufficient documentation

## 2019-07-09 DIAGNOSIS — M549 Dorsalgia, unspecified: Secondary | ICD-10-CM

## 2019-07-09 DIAGNOSIS — Z853 Personal history of malignant neoplasm of breast: Secondary | ICD-10-CM | POA: Insufficient documentation

## 2019-07-09 DIAGNOSIS — Y929 Unspecified place or not applicable: Secondary | ICD-10-CM | POA: Insufficient documentation

## 2019-07-09 DIAGNOSIS — C9110 Chronic lymphocytic leukemia of B-cell type not having achieved remission: Secondary | ICD-10-CM | POA: Diagnosis not present

## 2019-07-09 DIAGNOSIS — W19XXXA Unspecified fall, initial encounter: Secondary | ICD-10-CM

## 2019-07-09 DIAGNOSIS — Z79899 Other long term (current) drug therapy: Secondary | ICD-10-CM | POA: Insufficient documentation

## 2019-07-09 DIAGNOSIS — Z87891 Personal history of nicotine dependence: Secondary | ICD-10-CM | POA: Insufficient documentation

## 2019-07-09 DIAGNOSIS — Y999 Unspecified external cause status: Secondary | ICD-10-CM | POA: Insufficient documentation

## 2019-07-09 DIAGNOSIS — G9340 Encephalopathy, unspecified: Secondary | ICD-10-CM | POA: Diagnosis not present

## 2019-07-09 DIAGNOSIS — Y939 Activity, unspecified: Secondary | ICD-10-CM | POA: Insufficient documentation

## 2019-07-09 LAB — CBC
HCT: 36 % (ref 36.0–46.0)
Hemoglobin: 11.4 g/dL — ABNORMAL LOW (ref 12.0–15.0)
MCH: 33.7 pg (ref 26.0–34.0)
MCHC: 31.7 g/dL (ref 30.0–36.0)
MCV: 106.5 fL — ABNORMAL HIGH (ref 80.0–100.0)
Platelets: 196 10*3/uL (ref 150–400)
RBC: 3.38 MIL/uL — ABNORMAL LOW (ref 3.87–5.11)
RDW: 15 % (ref 11.5–15.5)
WBC: 5.7 10*3/uL (ref 4.0–10.5)
nRBC: 0 % (ref 0.0–0.2)

## 2019-07-09 LAB — URINALYSIS, ROUTINE W REFLEX MICROSCOPIC
Bilirubin Urine: NEGATIVE
Glucose, UA: NEGATIVE mg/dL
Hgb urine dipstick: NEGATIVE
Ketones, ur: 5 mg/dL — AB
Leukocytes,Ua: NEGATIVE
Nitrite: NEGATIVE
Protein, ur: NEGATIVE mg/dL
Specific Gravity, Urine: 1.01 (ref 1.005–1.030)
pH: 5 (ref 5.0–8.0)

## 2019-07-09 LAB — COMPREHENSIVE METABOLIC PANEL
ALT: 13 U/L (ref 0–44)
AST: 14 U/L — ABNORMAL LOW (ref 15–41)
Albumin: 2.6 g/dL — ABNORMAL LOW (ref 3.5–5.0)
Alkaline Phosphatase: 92 U/L (ref 38–126)
Anion gap: 8 (ref 5–15)
BUN: 6 mg/dL — ABNORMAL LOW (ref 8–23)
CO2: 22 mmol/L (ref 22–32)
Calcium: 7.5 mg/dL — ABNORMAL LOW (ref 8.9–10.3)
Chloride: 109 mmol/L (ref 98–111)
Creatinine, Ser: 0.48 mg/dL (ref 0.44–1.00)
GFR calc Af Amer: >60 mL/min (ref >60)
GFR calc non Af Amer: >60 mL/min (ref >60)
Glucose, Bld: 101 mg/dL — ABNORMAL HIGH (ref 70–99)
Potassium: 2.9 mmol/L — ABNORMAL LOW (ref 3.5–5.1)
Sodium: 139 mmol/L (ref 135–145)
Total Bilirubin: 1.1 mg/dL (ref 0.3–1.2)
Total Protein: 4.6 g/dL — ABNORMAL LOW (ref 6.5–8.1)

## 2019-07-09 LAB — ETHANOL: Alcohol, Ethyl (B): <10 mg/dL (ref <10)

## 2019-07-09 LAB — CBC WITH DIFFERENTIAL/PLATELET
Abs Immature Granulocytes: 0.04 10*3/uL (ref 0.00–0.07)
Basophils Absolute: 0 10*3/uL (ref 0.0–0.1)
Basophils Relative: 1 %
Eosinophils Absolute: 0.1 10*3/uL (ref 0.0–0.5)
Eosinophils Relative: 1 %
HCT: 31.9 % — ABNORMAL LOW (ref 36.0–46.0)
Hemoglobin: 10.6 g/dL — ABNORMAL LOW (ref 12.0–15.0)
Immature Granulocytes: 1 %
Lymphocytes Relative: 30 %
Lymphs Abs: 2 10*3/uL (ref 0.7–4.0)
MCH: 34.9 pg — ABNORMAL HIGH (ref 26.0–34.0)
MCHC: 33.2 g/dL (ref 30.0–36.0)
MCV: 104.9 fL — ABNORMAL HIGH (ref 80.0–100.0)
Monocytes Absolute: 0.6 10*3/uL (ref 0.1–1.0)
Monocytes Relative: 9 %
Neutro Abs: 3.9 10*3/uL (ref 1.7–7.7)
Neutrophils Relative %: 58 %
Platelets: 189 10*3/uL (ref 150–400)
RBC: 3.04 MIL/uL — ABNORMAL LOW (ref 3.87–5.11)
RDW: 15 % (ref 11.5–15.5)
WBC: 6.6 10*3/uL (ref 4.0–10.5)
nRBC: 0 % (ref 0.0–0.2)

## 2019-07-09 LAB — BASIC METABOLIC PANEL
Anion gap: 11 (ref 5–15)
BUN: 5 mg/dL — ABNORMAL LOW (ref 8–23)
CO2: 25 mmol/L (ref 22–32)
Calcium: 9.5 mg/dL (ref 8.9–10.3)
Chloride: 102 mmol/L (ref 98–111)
Creatinine, Ser: 0.54 mg/dL (ref 0.44–1.00)
GFR calc Af Amer: >60 mL/min (ref >60)
GFR calc non Af Amer: >60 mL/min (ref >60)
Glucose, Bld: 110 mg/dL — ABNORMAL HIGH (ref 70–99)
Potassium: 4.4 mmol/L (ref 3.5–5.1)
Sodium: 138 mmol/L (ref 135–145)

## 2019-07-09 LAB — AMMONIA: Ammonia: 13 umol/L (ref 9–35)

## 2019-07-09 LAB — MAGNESIUM: Magnesium: 1.6 mg/dL — ABNORMAL LOW (ref 1.7–2.4)

## 2019-07-09 LAB — LACTIC ACID, PLASMA: Lactic Acid, Venous: 0.8 mmol/L (ref 0.5–1.9)

## 2019-07-09 LAB — POTASSIUM: Potassium: 4.3 mmol/L (ref 3.5–5.1)

## 2019-07-09 MED ORDER — SODIUM CHLORIDE 0.9% FLUSH: 3.0000 mL | Freq: Once | INTRAVENOUS | Status: DC

## 2019-07-09 MED ORDER — MAGNESIUM OXIDE 400 (241.3 MG) MG PO TABS
800.0000 mg | ORAL_TABLET | Freq: Once | ORAL | Status: AC
  Administered 2019-07-09: 800 mg via ORAL
  Filled 2019-07-09: qty 2

## 2019-07-09 MED ORDER — POTASSIUM CHLORIDE 10 MEQ/100ML IV SOLN
10.0000 meq | INTRAVENOUS | Status: AC
  Administered 2019-07-09 (×3): 10 meq via INTRAVENOUS
  Filled 2019-07-09 (×3): qty 100

## 2019-07-09 MED ORDER — POTASSIUM CHLORIDE CRYS ER 20 MEQ PO TBCR
40.0000 meq | EXTENDED_RELEASE_TABLET | Freq: Once | ORAL | Status: AC
  Administered 2019-07-09: 40 meq via ORAL
  Filled 2019-07-09: qty 2

## 2019-07-09 NOTE — ED Provider Notes (Signed)
[facility] DEPT Provider Note   CSN: [id number] Arrival date & time: 07/09/19  1648     History Chief Complaint  Patient presents with  . Fall  . Head Injury    April [name] is a 84 y.o. female with a history of CLL, breast cancer, confusion at baseline, presenting to the ED with mechanical fall.  Patient was just discharged from the emergency department yesterday, at which time she was found to be hypokalemic and given potassium electrolytes IV fluids.  She reports that today she was attempting get out of her recliner chair and when she stood up, her legs gave out on her and she fell to the ground.  She says she struck the front of her head on the ground but did not lose consciousness.  She cannot recall whether she is on any blood thinners.  She is reporting a mild headache.  No nausea or vomiting.   HPI     Past Medical History:  Diagnosis Date  . Breast CA ([place])   . Bronchitis, chronic ([place])   . Eye hemorrhage, left   . Leukemia, chronic lymphoid ([place])   . 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 Osteoporosis 05/2018   T score -3.1 overall stable from prior study    Patient Active Problem List   Diagnosis Date Noted  . Other constipation 06/18/2019  . Physical debility 06/18/2019  . Abdominal distension 06/08/2019  . Severe pain 06/08/2019  . Intractable back pain 06/08/2019  . Closed wedge compression fracture of T8 vertebra ([place]) 06/07/2019  . Sinus tachycardia 06/07/2019  . Intractable pain 06/07/2019  . Goals of care, counseling/discussion 05/14/2019  . Abnormal liver enzymes 05/14/2019  . Dehydration 05/14/2019  . Failure to thrive in adult 05/14/2019  . Abdominal pain 04/16/2019  . Cancer associated pain 04/16/2019  . Breast cancer ([place]) 06/19/2018  . Skin lesion 05/06/2017  . Essential hypertension 12/14/2016  . Systolic hypertension, isolated 11/16/2016  . Central retinal vein occlusion  10/18/2016  . Preventive measure 04/28/2015  . Thrombocytopenia ([place]) 04/28/2015  . Idiopathic scoliosis 10/23/2012  . CLL (chronic lymphocytic leukemia) ([place]) 03/02/2009  . VENOUS INSUFFICIENCY, CHRONIC 10/28/2008  . Dyslipidemia 10/27/2007  . Osteoporosis 10/27/2007  . SKIN CANCER, HX OF 10/27/2007  . DIVERTICULITIS, HX OF 04/08/2007    Past Surgical History:  Procedure Laterality Date  . ABDOMINAL HYSTERECTOMY  1980  . BREAST IMPLANTS REMOVED  2001  . BREAST SURGERY     Bilateral mastectomy  . IR RADIOLOGIST EVAL & MGMT  07/01/2019  . MASTECTOMY  1982   BILATERAL  . OOPHORECTOMY     BSO  . PARTIAL COLECTOMY     INTESTINAL ABSCESS WITH SALPINGECTOMY  . RECONSTRUCTION LEFT ELBOW    . TONSILLECTOMY    . UMBILLICAL HERNIA REPAIR       OB History    Gravida  0   Para  0   Term      Preterm      AB      Living        SAB      TAB      Ectopic      Multiple      Live Births              Family History  Problem Relation Age of Onset  . Cancer Brother        HODGKINS  . Cancer Brother  leukemia  . Parkinson's disease Brother   . Heart disease Mother   . Heart disease Sister     Social History   Tobacco Use  . Smoking status: Former Smoker    Types: Cigarettes    Quit date: 03/26/1955    Years since quitting: 64.3  . Smokeless tobacco: Never Used  Substance Use Topics  . Alcohol use: Never  . Drug use: No    Home Medications Prior to Admission medications   Medication Sig Start Date End Date Taking? Authorizing Provider  acetaminophen (TYLENOL) 500 MG tablet Take 2 tablets (1,000 mg total) by mouth every 8 (eight) hours. Patient taking differently: Take 1,000 mg by mouth every 8 (eight) hours as needed for moderate pain.  06/10/19 06/09/20 Yes Mercy Riding, MD  amLODipine (NORVASC) 5 MG tablet Take 1 tablet (5 mg total) by mouth daily. 05/17/19  Yes Shelly Coss, MD  cyclobenzaprine (FLEXERIL) 5 MG tablet Take 1 tablet (5 mg  total) by mouth 3 (three) times daily as needed for muscle spasms. 06/18/19  Yes Gorsuch, Ni, MD  HYDROmorphone (DILAUDID) 4 MG tablet Take 1 to 2 tablet as needed for breakthrough Patient taking differently: Take 4 mg by mouth every 6 (six) hours as needed for moderate pain or severe pain. Take 1 to 2 tablet as needed for breakthrough 07/07/19  Yes Gorsuch, Ni, MD  lidocaine (LIDODERM) 5 % Place 1 patch onto the skin every 12 (twelve) hours. Remove & Discard patch within 12 hours or as directed by MD 06/10/19 07/10/19 Yes Mercy Riding, MD  methadone (DOLOPHINE) 10 MG tablet Take 2 tablets (20 mg total) by mouth every 8 (eight) hours. 06/30/19  Yes Gorsuch, Ni, MD  ondansetron (ZOFRAN) 4 MG tablet Take 4 mg by mouth every 8 (eight) hours as needed for nausea or vomiting.   Yes [provider]  polyethylene glycol (MIRALAX / GLYCOLAX) 17 g packet Take 17 g 1-3 times a day as needed for constipation 06/10/19  Yes Gonfa, Taye T, MD  predniSONE (DELTASONE) 20 MG tablet Take 1 tablet (20 mg total) by mouth daily with breakfast. 06/18/19  Yes Gorsuch, Ni, MD  prochlorperazine (COMPAZINE) 5 MG tablet Take 5 mg by mouth every 6 (six) hours as needed for nausea or vomiting.    Yes [provider]  senna-docusate (SENOKOT-S) 8.6-50 MG tablet Take 1 tablet 1-2 times a day as needed for constipation 06/10/19  Yes Gonfa, Taye T, MD  augmented betamethasone dipropionate (DIPROLENE-AF) 0.05 % cream Apply 1 application topically 2 (two) times daily as needed (flare ups).  05/08/18   [provider]  bisacodyl (DULCOLAX) 10 MG suppository Place 1 suppository (10 mg total) rectally as needed for moderate constipation. 06/18/19   Heath Lark, MD    Allergies    Codeine and Levofloxacin  Review of Systems   Review of Systems  Constitutional: Negative for chills and fever.  Eyes: Negative for photophobia and visual disturbance.  Respiratory: Negative for cough and shortness of breath.     Cardiovascular: Negative for chest pain and palpitations.  Gastrointestinal: Negative for abdominal pain, nausea and vomiting.  Skin: Negative for pallor and rash.  Neurological: Negative for syncope and headaches.  All other systems reviewed and are negative.   Physical Exam Updated Vital Signs BP (!) 155/89   Pulse (!) 110   Temp 98.1 F (36.7 C)   Resp 18   Ht 4\' 10"  (1.473 m)   Wt 44 kg   LMP  03/26/1979   SpO2 99%   BMI 20.27 kg/m   Physical Exam Vitals and nursing note reviewed.  Constitutional:      General: She is not in acute distress.    Appearance: She is well-developed.  HENT:     Head: Normocephalic and atraumatic.  Eyes:     Conjunctiva/sclera: Conjunctivae normal.  Cardiovascular:     Rate and Rhythm: Normal rate and regular rhythm.     Pulses: Normal pulses.  Pulmonary:     Effort: Pulmonary effort is normal. No respiratory distress.  Abdominal:     Palpations: Abdomen is soft.     Tenderness: There is no abdominal tenderness.  Musculoskeletal:     Cervical back: Neck supple.  Skin:    General: Skin is warm and dry.  Neurological:     General: No focal deficit present.     Mental Status: She is alert.     ED Results / Procedures / Treatments   Labs (all labs ordered are listed, but only abnormal results are displayed) Labs Reviewed  BASIC METABOLIC PANEL - Abnormal; Notable for the following components:      Result Value   Glucose, Bld 110 (*)    BUN 5 (*)    All other components within normal limits  CBC - Abnormal; Notable for the following components:   RBC 3.38 (*)    Hemoglobin 11.4 (*)    MCV 106.5 (*)    All other components within normal limits  CBG MONITORING, ED    EKG EKG Interpretation  Date/Time:  Thursday July 09 2019 17:03:13 EST Ventricular Rate:  108 PR Interval:    QRS Duration: 72 QT Interval:  312 QTC Calculation: 419 R Axis:   37 Text Interpretation: Sinus tachycardia Borderline low voltage, extremity  leads Baseline wander in lead(s) V4 No STMEI Confirmed by Octaviano Glow [phone]) on 07/09/2019 5:35:26 PM   Radiology DG Chest 1 View  Result Date: 07/08/2019 CLINICAL DATA:  Weakness and altered mental status. EXAM: CHEST  1 VIEW COMPARISON:  Radiographs and CT 06/07/2019 FINDINGS: The cardiomediastinal contours are unchanged. Aortic tortuosity and atherosclerosis. Pulmonary vasculature is normal. No consolidation, pleural effusion, or pneumothorax. No acute osseous abnormalities are seen. Scoliotic curvature in the spine. Bones diffusely under mineralized. IMPRESSION: 1. No acute abnormality. 2.  Aortic Atherosclerosis (ICD10-I70.0). Electronically Signed   By: Keith Rake M.D.   On: 07/08/2019 23:46   CT Head Wo Contrast  Result Date: 07/09/2019 CLINICAL DATA:  Fall with trauma to the head. EXAM: CT HEAD WITHOUT CONTRAST TECHNIQUE: Contiguous axial images were obtained from the base of the skull through the vertex without intravenous contrast. COMPARISON:  07/08/2019 FINDINGS: Brain: No change. Age related atrophy. Mild chronic small-vessel change of the white matter. Chronic small-vessel change of the pons. No sign of acute infarction, mass lesion, hemorrhage, hydrocephalus or extra-axial collection. Vascular: There is atherosclerotic calcification of the major vessels at the base of the brain. Skull: Negative Sinuses/Orbits: Clear/normal Other: None IMPRESSION: No change. No acute brain finding. Atrophy and chronic small-vessel change of the white matter. No traumatic finding. Electronically Signed   By: Nelson Chimes M.D.   On: 07/09/2019 18:28    Procedures Procedures (including critical care time)  Medications Ordered in ED Medications - No data to display  ED Course  I have reviewed the triage vital signs and the nursing notes.  Pertinent labs & imaging results that were available during my care of the patient were reviewed by me and  considered in my medical decision making (see  chart for details).  84 yo female presenting with a fall at home today.  She feels like her legs gave out on her.  She was just treated in the ED yesterday for mild hypoK and dehydration - her repeat labs today look good, with improvement of her K+ and Cr+.  I do not believe she truly became dehydrated again in the span of 24 hours.  Her PCP office note does express some concern that the patient appears spacey and not behaving like herself.  The patient was referred into the ED for trauma evaluation given her head fall.  We'll obtain a CTH.  Unfortunately it does appear this patient may be in gradual decline from a cognitive standpoint, and there may be some component of delirium (possibly opioid related? Given her chronic back pain...she is on dilaudid at home).  She is fully oriented on my exam, but I explained to her and her husband that this can be waxing and waning, and we also talked about a possible sundowning effect. I advised that if we discharge them home today (they strongly desire to go home if her ED workup is negative), it would be helpful for him to dole out her pain medications on a regular regimen, stick to daytime hours if possible, and we'll have our case manager reach out to them about a possible home health aide.    Clinical Course as of Jul 10 1151  Thu Jul 09, 2019  [id number] Patient's husband is now present at bedside.  He reports that she did have a mechanical fall today.  He does not believe she is more confused than normal.  They have a strong desire to go home.  Explained that her CT scan is negative for acute pathology, and she does not appear dehydrated, and her electrolytes are within normal limits today.  I believe it is reasonable to discharge her home.  We will have our case manager follow-up with them about getting set up with possible home health aide.  I understand her husband may have a hard time fully monitoring her in the daytime.  However, she remains fairly independent  at baseline, according to both of them.  There may be some element of mild delirium with her pain medication regimen, which is another reason they might benefit from home health aides.  I do not believe hospitalization would benefit her on this account at this time, and may infact worsen her delirium issues.   [MT]    Clinical Course User Index [MT] Kinaya Hilliker, Carola Rhine, MD    Final Clinical Impression(s) / ED Diagnoses Final diagnoses:  Fall, initial encounter  Injury of head, initial encounter    Rx / DC Orders ED Discharge Orders    None       Langston Masker Carola Rhine, MD 07/10/19 1152

## 2019-07-09 NOTE — Telephone Encounter (Signed)
Copied from [place] [id number]. Topic: Appointment Scheduling - Scheduling Inquiry for Clinic >> Jul 09, 2019 11:56 AM Richardo Priest, NT wrote: Reason for CRM: Pt called in stating they would like to have an ED follow up visit with PCP as soon as possible to go ahead and have some medications cleared up as well as discuss what had happened. Please advise.

## 2019-07-09 NOTE — ED Notes (Signed)
Pt ambulatory to bathroom with standby assistance 

## 2019-07-09 NOTE — ED Notes (Signed)
Per Dr Earle Gell patient fell and hit head-states she was here yesterday-states she is complaining of blurred vision and is acting "spacy"

## 2019-07-09 NOTE — ED Provider Notes (Signed)
Received in signout from dr Marisue Humble.  Patient with ams.  Workup largely unremarkable with exception of hypokalemia.  Plan to recheck K and discuss with husband on arrival.   K improved, d/c home.    Deno Etienne, DO 07/09/19 1332

## 2019-07-09 NOTE — ED Provider Notes (Signed)
Nursing notes and vitals signs, including pulse oximetry, reviewed.  Summary of this visit's results, reviewed by myself:  EKG:  EKG Interpretation  Date/Time:  Wednesday July 08 2019 22:26:29 EST Ventricular Rate:  122 PR Interval:    QRS Duration: 73 QT Interval:  297 QTC Calculation: 424 R Axis:   26 Text Interpretation: Sinus tachycardia Consider left ventricular hypertrophy Abnormal ECG Confirmed by Carmin Muskrat [phone]) on 07/08/2019 10:56:04 PM       Labs:  Results for orders placed or performed during the hospital encounter of 07/08/19 (from the past 24 hour(s))  Urinalysis, Routine w reflex microscopic     Status: Abnormal   Collection Time: 07/08/19 10:54 PM  Result Value Ref Range   Color, Urine YELLOW YELLOW   APPearance CLEAR CLEAR   Specific Gravity, Urine 1.010 1.005 - 1.030   pH 5.0 5.0 - 8.0   Glucose, UA NEGATIVE NEGATIVE mg/dL   Hgb urine dipstick NEGATIVE NEGATIVE   Bilirubin Urine NEGATIVE NEGATIVE   Ketones, ur 5 (A) NEGATIVE mg/dL   Protein, ur NEGATIVE NEGATIVE mg/dL   Nitrite NEGATIVE NEGATIVE   Leukocytes,Ua NEGATIVE NEGATIVE  Ammonia     Status: None   Collection Time: 07/09/19 12:23 AM  Result Value Ref Range   Ammonia 13 9 - 35 umol/L  Comprehensive metabolic panel     Status: Abnormal   Collection Time: 07/09/19 12:23 AM  Result Value Ref Range   Sodium 139 135 - 145 mmol/L   Potassium 2.9 (L) 3.5 - 5.1 mmol/L   Chloride 109 98 - 111 mmol/L   CO2 22 22 - 32 mmol/L   Glucose, Bld 101 (H) 70 - 99 mg/dL   BUN 6 (L) 8 - 23 mg/dL   Creatinine, Ser 0.48 0.44 - 1.00 mg/dL   Calcium 7.5 (L) 8.9 - 10.3 mg/dL   Total Protein 4.6 (L) 6.5 - 8.1 g/dL   Albumin 2.6 (L) 3.5 - 5.0 g/dL   AST 14 (L) 15 - 41 U/L   ALT 13 0 - 44 U/L   Alkaline Phosphatase 92 38 - 126 U/L   Total Bilirubin 1.1 0.3 - 1.2 mg/dL   GFR calc non Af Amer >60 >60 mL/min   GFR calc Af Amer >60 >60 mL/min   Anion gap 8 5 - 15  Lactic acid, plasma     Status: None   Collection Time: 07/09/19 12:23 AM  Result Value Ref Range   Lactic Acid, Venous 0.8 0.5 - 1.9 mmol/L  Ethanol     Status: None   Collection Time: 07/09/19 12:23 AM  Result Value Ref Range   Alcohol, Ethyl (B) <10 <10 mg/dL  CBC WITH DIFFERENTIAL     Status: Abnormal   Collection Time: 07/09/19 12:23 AM  Result Value Ref Range   WBC 6.6 4.0 - 10.5 K/uL   RBC 3.04 (L) 3.87 - 5.11 MIL/uL   Hemoglobin 10.6 (L) 12.0 - 15.0 g/dL   HCT 31.9 (L) 36.0 - 46.0 %   MCV 104.9 (H) 80.0 - 100.0 fL   MCH 34.9 (H) 26.0 - 34.0 pg   MCHC 33.2 30.0 - 36.0 g/dL   RDW 15.0 11.5 - 15.5 %   Platelets 189 150 - 400 K/uL   nRBC 0.0 0.0 - 0.2 %   Neutrophils Relative % 58 %   Neutro Abs 3.9 1.7 - 7.7 K/uL   Lymphocytes Relative 30 %   Lymphs Abs 2.0 0.7 - 4.0 K/uL   Monocytes Relative 9 %  Monocytes Absolute 0.6 0.1 - 1.0 K/uL   Eosinophils Relative 1 %   Eosinophils Absolute 0.1 0.0 - 0.5 K/uL   Basophils Relative 1 %   Basophils Absolute 0.0 0.0 - 0.1 K/uL   Immature Granulocytes 1 %   Abs Immature Granulocytes 0.04 0.00 - 0.07 K/uL    Imaging Studies: DG Chest 1 View  Result Date: 07/08/2019 CLINICAL DATA:  Weakness and altered mental status. EXAM: CHEST  1 VIEW COMPARISON:  Radiographs and CT 06/07/2019 FINDINGS: The cardiomediastinal contours are unchanged. Aortic tortuosity and atherosclerosis. Pulmonary vasculature is normal. No consolidation, pleural effusion, or pneumothorax. No acute osseous abnormalities are seen. Scoliotic curvature in the spine. Bones diffusely under mineralized. IMPRESSION: 1. No acute abnormality. 2.  Aortic Atherosclerosis (ICD10-I70.0). Electronically Signed   By: Keith Rake M.D.   On: 07/08/2019 23:46   CT HEAD WO CONTRAST  Result Date: 07/08/2019 CLINICAL DATA:  Altered level of consciousness (LOC), unexplained. Weakness, dizziness, confusion. EXAM: CT HEAD WITHOUT CONTRAST TECHNIQUE: Contiguous axial images were obtained from the base of the skull through  the vertex without intravenous contrast. COMPARISON:  Head CT 06/02/2019 FINDINGS: Brain: No intracranial hemorrhage, mass effect, or midline shift. Stable atrophy from prior. Mild to moderate chronic small vessel ischemia. No hydrocephalus. The basilar cisterns are patent. No evidence of territorial infarct or acute ischemia. No extra-axial or intracranial fluid collection. Vascular: No hyperdense vessel. Skull: No fracture or focal lesion. Sinuses/Orbits: Paranasal sinuses and mastoid air cells are clear. The visualized orbits are unremarkable. Bilateral lens extraction. Other: None. IMPRESSION: 1. No acute intracranial abnormality. 2. Stable atrophy and chronic small vessel ischemia. Electronically Signed   By: Keith Rake M.D.   On: 07/08/2019 23:53   4:09 AM Patient awake and alert.  She was advised of reassuring diagnostic studies.  Patient continues to be tachycardic despite fluid bolus.  Potassium repletion ordered.   7:34 AM Potassium runs completed.  Will recheck potassium level.  Dr. Darci Current will follow up and make disposition.  Anticipate discharge home if husband is comfortable with this.`   Kervin Bones, Jenny Reichmann, MD 07/09/19 [phone]

## 2019-07-09 NOTE — Discharge Instructions (Signed)
Your CT scan did not show any signs of a brain bleed or bone fracture.  Your electrolytes today including a potassium level were normal.  Please keep yourself hydrated with water at home.  We will have our case manager reach out to you about possibly getting set up with a home health aide.

## 2019-07-09 NOTE — Telephone Encounter (Signed)
Called the patient to schedule. Her husband answered the phone and the patient fell out of her chair trying to get the phone. Spoke with the patient, she stated that she hit her head when she fell.  Vision is normal, hearing is normal, no other known injury.   Patient has been scheduled for 4pm today.

## 2019-07-09 NOTE — ED Notes (Signed)
Spoke with pts spouse, who reports he will be coming to ED in appx 30 min to pick up pt.

## 2019-07-09 NOTE — ED Notes (Signed)
Patient transported to CT 

## 2019-07-09 NOTE — Discharge Instructions (Signed)
Return for worsening symptoms.    

## 2019-07-09 NOTE — Telephone Encounter (Signed)
[facility] ED CM received call from [facility] Unit Secretary concerning [facility] consult, CM noted patient was not in a room, appeared to be in CT. CM reviewed chart and noted that patient was already discharge, CM noted no HH orders were placed. CM sent secured chat message to place orders for [facility] services
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
## 2019-07-09 NOTE — ED Notes (Signed)
Pt assisted to dress, wheeled to ED entrance to meet spouse.  D/c paperwork reviewed with pt and spouse.

## 2019-07-09 NOTE — ED Triage Notes (Signed)
Patient reports that she was getting up from a chair and home and fell. Patient states she landed on her face. Patient has abrasions to the bridge of her nose and forehead.  patient denies any blood thinners, blurred vision, nausea, dizziness.  Patient c/o upper, mid, and lower back pain,but states this is not new.  patient is not sure if she passed out or not.

## 2019-07-09 NOTE — Telephone Encounter (Signed)
Spoke with patient she stated her main concern is that Both feet and ankles are swelling, X3 weeks. Patient states it is very uncomfortable and hard to walk. Her shoes are to tight on her feet.  Please advise

## 2019-07-09 NOTE — Telephone Encounter (Signed)
Ok to schedule 4pm tomorrow. Better if in person so I can see her legs. She can come 15 min earlier as I can probably see her sooner. [place]

## 2019-07-09 NOTE — ED Notes (Signed)
Attempted to notify spouse, Eymi Corriveau, of pts discharge status.  No answer at number listed.  Left HIPAA compliant message.

## 2019-07-10 NOTE — Telephone Encounter (Signed)
Patient seen on 07/09/19.

## 2019-07-10 NOTE — Telephone Encounter (Signed)
Patient seen on 07/09/2019.

## 2019-07-15 ENCOUNTER — Telehealth: Payer: Self-pay

## 2019-07-15 ENCOUNTER — Emergency Department (HOSPITAL_COMMUNITY): Payer: Medicare Other

## 2019-07-15 ENCOUNTER — Inpatient Hospital Stay (HOSPITAL_COMMUNITY)
Admission: EM | Admit: 2019-07-15 | Discharge: 2019-07-18 | DRG: 542 | Disposition: A | Discharge status: Home Health | Payer: Medicare Other | Attending: Internal Medicine | Admitting: Internal Medicine

## 2019-07-15 ENCOUNTER — Ambulatory Visit (HOSPITAL_COMMUNITY)
Admission: RE | Admit: 2019-07-15 | Discharge: 2019-07-15 | Disposition: A | Discharge status: Home / Self Care | Payer: Medicare Other | Source: Ambulatory Visit | Attending: Diagnostic Radiology | Admitting: Diagnostic Radiology

## 2019-07-15 ENCOUNTER — Other Ambulatory Visit: Payer: Self-pay

## 2019-07-15 ENCOUNTER — Encounter (HOSPITAL_COMMUNITY): Payer: Self-pay | Admitting: Emergency Medicine

## 2019-07-15 DIAGNOSIS — C9110 Chronic lymphocytic leukemia of B-cell type not having achieved remission: Secondary | ICD-10-CM | POA: Diagnosis present

## 2019-07-15 DIAGNOSIS — G8929 Other chronic pain: Secondary | ICD-10-CM | POA: Diagnosis present

## 2019-07-15 DIAGNOSIS — Z885 Allergy status to narcotic agent status: Secondary | ICD-10-CM

## 2019-07-15 DIAGNOSIS — Y92238 Other place in hospital as the place of occurrence of the external cause: Secondary | ICD-10-CM | POA: Diagnosis not present

## 2019-07-15 DIAGNOSIS — M545 Low back pain, unspecified: Secondary | ICD-10-CM

## 2019-07-15 DIAGNOSIS — S32010A Wedge compression fracture of first lumbar vertebra, initial encounter for closed fracture: Secondary | ICD-10-CM

## 2019-07-15 DIAGNOSIS — Z9013 Acquired absence of bilateral breasts and nipples: Secondary | ICD-10-CM

## 2019-07-15 DIAGNOSIS — M549 Dorsalgia, unspecified: Secondary | ICD-10-CM | POA: Diagnosis present

## 2019-07-15 DIAGNOSIS — Z85828 Personal history of other malignant neoplasm of skin: Secondary | ICD-10-CM

## 2019-07-15 DIAGNOSIS — R296 Repeated falls: Secondary | ICD-10-CM | POA: Diagnosis present

## 2019-07-15 DIAGNOSIS — Z87891 Personal history of nicotine dependence: Secondary | ICD-10-CM

## 2019-07-15 DIAGNOSIS — M8088XA Other osteoporosis with current pathological fracture, vertebra(e), initial encounter for fracture: Secondary | ICD-10-CM | POA: Diagnosis not present

## 2019-07-15 DIAGNOSIS — R000 Tachycardia, unspecified: Secondary | ICD-10-CM | POA: Diagnosis present

## 2019-07-15 DIAGNOSIS — Z806 Family history of leukemia: Secondary | ICD-10-CM

## 2019-07-15 DIAGNOSIS — K5900 Constipation, unspecified: Secondary | ICD-10-CM | POA: Diagnosis present

## 2019-07-15 DIAGNOSIS — Z807 Family history of other malignant neoplasms of lymphoid, hematopoietic and related tissues: Secondary | ICD-10-CM

## 2019-07-15 DIAGNOSIS — Z888 Allergy status to other drugs, medicaments and biological substances status: Secondary | ICD-10-CM

## 2019-07-15 DIAGNOSIS — Z7952 Long term (current) use of systemic steroids: Secondary | ICD-10-CM

## 2019-07-15 DIAGNOSIS — Z820 Family history of epilepsy and other diseases of the nervous system: Secondary | ICD-10-CM

## 2019-07-15 DIAGNOSIS — G92 Toxic encephalopathy: Secondary | ICD-10-CM | POA: Diagnosis not present

## 2019-07-15 DIAGNOSIS — T40695A Adverse effect of other narcotics, initial encounter: Secondary | ICD-10-CM | POA: Diagnosis not present

## 2019-07-15 DIAGNOSIS — Z8572 Personal history of non-Hodgkin lymphomas: Secondary | ICD-10-CM

## 2019-07-15 DIAGNOSIS — G9341 Metabolic encephalopathy: Secondary | ICD-10-CM | POA: Diagnosis present

## 2019-07-15 DIAGNOSIS — Z79899 Other long term (current) drug therapy: Secondary | ICD-10-CM

## 2019-07-15 DIAGNOSIS — M419 Scoliosis, unspecified: Secondary | ICD-10-CM | POA: Diagnosis present

## 2019-07-15 DIAGNOSIS — Z20828 Contact with and (suspected) exposure to other viral communicable diseases: Secondary | ICD-10-CM | POA: Diagnosis present

## 2019-07-15 DIAGNOSIS — Z8249 Family history of ischemic heart disease and other diseases of the circulatory system: Secondary | ICD-10-CM

## 2019-07-15 DIAGNOSIS — Z853 Personal history of malignant neoplasm of breast: Secondary | ICD-10-CM

## 2019-07-15 DIAGNOSIS — M546 Pain in thoracic spine: Secondary | ICD-10-CM

## 2019-07-15 DIAGNOSIS — Z9181 History of falling: Secondary | ICD-10-CM

## 2019-07-15 LAB — CBC WITH DIFFERENTIAL/PLATELET
Abs Immature Granulocytes: 0.04 10*3/uL (ref 0.00–0.07)
Basophils Absolute: 0.1 10*3/uL (ref 0.0–0.1)
Basophils Relative: 1 %
Eosinophils Absolute: 0 10*3/uL (ref 0.0–0.5)
Eosinophils Relative: 0 %
HCT: 37.4 % (ref 36.0–46.0)
Hemoglobin: 12.3 g/dL (ref 12.0–15.0)
Immature Granulocytes: 1 %
Lymphocytes Relative: 25 %
Lymphs Abs: 2.1 10*3/uL (ref 0.7–4.0)
MCH: 33.9 pg (ref 26.0–34.0)
MCHC: 32.9 g/dL (ref 30.0–36.0)
MCV: 103 fL — ABNORMAL HIGH (ref 80.0–100.0)
Monocytes Absolute: 1 10*3/uL (ref 0.1–1.0)
Monocytes Relative: 11 %
Neutro Abs: 5.2 10*3/uL (ref 1.7–7.7)
Neutrophils Relative %: 62 %
Platelets: 196 10*3/uL (ref 150–400)
RBC: 3.63 MIL/uL — ABNORMAL LOW (ref 3.87–5.11)
RDW: 14.5 % (ref 11.5–15.5)
WBC: 8.4 10*3/uL (ref 4.0–10.5)
nRBC: 0 % (ref 0.0–0.2)

## 2019-07-15 LAB — BASIC METABOLIC PANEL
Anion gap: 11 (ref 5–15)
BUN: 6 mg/dL — ABNORMAL LOW (ref 8–23)
CO2: 26 mmol/L (ref 22–32)
Calcium: 9.4 mg/dL (ref 8.9–10.3)
Chloride: 98 mmol/L (ref 98–111)
Creatinine, Ser: 0.54 mg/dL (ref 0.44–1.00)
GFR calc Af Amer: >60 mL/min (ref >60)
GFR calc non Af Amer: >60 mL/min (ref >60)
Glucose, Bld: 121 mg/dL — ABNORMAL HIGH (ref 70–99)
Potassium: 3.5 mmol/L (ref 3.5–5.1)
Sodium: 135 mmol/L (ref 135–145)

## 2019-07-15 LAB — URINALYSIS, ROUTINE W REFLEX MICROSCOPIC
Bacteria, UA: NONE SEEN
Bilirubin Urine: NEGATIVE
Glucose, UA: NEGATIVE mg/dL
Ketones, ur: 5 mg/dL — AB
Leukocytes,Ua: NEGATIVE
Nitrite: NEGATIVE
Protein, ur: NEGATIVE mg/dL
Specific Gravity, Urine: 1.013 (ref 1.005–1.030)
pH: 6 (ref 5.0–8.0)

## 2019-07-15 MED ORDER — ENOXAPARIN SODIUM 30 MG/0.3ML ~~LOC~~ SOLN
30.0000 mg | Freq: Every day | SUBCUTANEOUS | Status: DC
  Administered 2019-07-16 – 2019-07-17 (×3): 30 mg via SUBCUTANEOUS
  Filled 2019-07-15 (×2): qty 0.3

## 2019-07-15 MED ORDER — KETOROLAC TROMETHAMINE 15 MG/ML IJ SOLN
7.5000 mg | Freq: Once | INTRAMUSCULAR | Status: AC
  Administered 2019-07-15: 7.5 mg via INTRAVENOUS
  Filled 2019-07-15: qty 1

## 2019-07-15 MED ORDER — ONDANSETRON HCL 4 MG/2ML IJ SOLN: 4.0000 mg | Freq: Four times a day (QID) | INTRAMUSCULAR | Status: DC | PRN

## 2019-07-15 MED ORDER — HYDROMORPHONE HCL 1 MG/ML IJ SOLN
0.7500 mg | Freq: Once | INTRAMUSCULAR | Status: AC
  Administered 2019-07-15: 0.75 mg via INTRAVENOUS
  Filled 2019-07-15: qty 1

## 2019-07-15 MED ORDER — ONDANSETRON HCL 4 MG PO TABS: 4.0000 mg | ORAL_TABLET | Freq: Four times a day (QID) | ORAL | Status: DC | PRN

## 2019-07-15 MED ORDER — LORAZEPAM 2 MG/ML IJ SOLN
0.5000 mg | Freq: Once | INTRAMUSCULAR | Status: AC
  Administered 2019-07-15: 16:00:00 0.5 mg via INTRAVENOUS
  Filled 2019-07-15: qty 1

## 2019-07-15 MED ORDER — LORAZEPAM 2 MG/ML IJ SOLN
0.5000 mg | Freq: Once | INTRAMUSCULAR | Status: AC
  Administered 2019-07-15: 0.5 mg via INTRAVENOUS
  Filled 2019-07-15: qty 1

## 2019-07-15 MED ORDER — NALOXONE HCL 2 MG/2ML IJ SOSY
PREFILLED_SYRINGE | INTRAMUSCULAR | Status: AC
  Administered 2019-07-15: 0.25 mg
  Filled 2019-07-15: qty 2

## 2019-07-15 MED ORDER — HYDROMORPHONE HCL 1 MG/ML IJ SOLN
0.5000 mg | Freq: Once | INTRAMUSCULAR | Status: AC
  Administered 2019-07-15: 0.5 mg via INTRAVENOUS
  Filled 2019-07-15: qty 1

## 2019-07-15 MED ORDER — SODIUM CHLORIDE 0.9 % IV SOLN: INTRAVENOUS | Status: DC

## 2019-07-15 NOTE — ED Provider Notes (Signed)
[facility] DEPT Provider Note   CSN: [id number] Arrival date & time: 07/15/19  1258     History Chief Complaint  Patient presents with  . Back Pain    April [name] is a 84 y.o. female.  HPI  84 year old female with back pain.  Mid back.  Onset months ago.  She feels like it is progressively worsening.  She has pain at rest and movement.  Does not radiate.  No numbness, tingling or focal loss of strength.  No acute urinary complaints.  No fevers or chills.  Denies any history of back surgery.  She reports that she was scheduled to have an MRI of her thoracic and lumbar spine today but could not tolerate the exam because of discomfort.  Past Medical History:  Diagnosis Date  . Breast CA ([place])   . Bronchitis, chronic ([place])   . Eye hemorrhage, left   . Leukemia, chronic lymphoid ([place])   . 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 Osteoporosis 05/2018   T score -3.1 overall stable from prior study    Patient Active Problem List   Diagnosis Date Noted  . Other constipation 06/18/2019  . Physical debility 06/18/2019  . Abdominal distension 06/08/2019  . Severe pain 06/08/2019  . Intractable back pain 06/08/2019  . Closed wedge compression fracture of T8 vertebra ([place]) 06/07/2019  . Sinus tachycardia 06/07/2019  . Intractable pain 06/07/2019  . Goals of care, counseling/discussion 05/14/2019  . Abnormal liver enzymes 05/14/2019  . Dehydration 05/14/2019  . Failure to thrive in adult 05/14/2019  . Abdominal pain 04/16/2019  . Cancer associated pain 04/16/2019  . Breast cancer ([place]) 06/19/2018  . Skin lesion 05/06/2017  . Essential hypertension 12/14/2016  . Systolic hypertension, isolated 11/16/2016  . Central retinal vein occlusion 10/18/2016  . Preventive measure 04/28/2015  . Thrombocytopenia ([place]) 04/28/2015  . Idiopathic scoliosis 10/23/2012  . CLL (chronic lymphocytic leukemia) ([place]) 03/02/2009  . VENOUS  INSUFFICIENCY, CHRONIC 10/28/2008  . Dyslipidemia 10/27/2007  . Osteoporosis 10/27/2007  . SKIN CANCER, HX OF 10/27/2007  . DIVERTICULITIS, HX OF 04/08/2007    Past Surgical History:  Procedure Laterality Date  . ABDOMINAL HYSTERECTOMY  1980  . BREAST IMPLANTS REMOVED  2001  . BREAST SURGERY     Bilateral mastectomy  . IR RADIOLOGIST EVAL & MGMT  07/01/2019  . MASTECTOMY  1982   BILATERAL  . OOPHORECTOMY     BSO  . PARTIAL COLECTOMY     INTESTINAL ABSCESS WITH SALPINGECTOMY  . RECONSTRUCTION LEFT ELBOW    . TONSILLECTOMY    . UMBILLICAL HERNIA REPAIR       OB History    Gravida  0   Para  0   Term      Preterm      AB      Living        SAB      TAB      Ectopic      Multiple      Live Births              Family History  Problem Relation Age of Onset  . Cancer Brother        HODGKINS  . Cancer Brother        leukemia  . Parkinson's disease Brother   . Heart disease Mother   . Heart disease Sister     Social History   Tobacco Use  . Smoking  status: Former Smoker    Types: Cigarettes    Quit date: 03/26/1955    Years since quitting: 64.3  . Smokeless tobacco: Never Used  Substance Use Topics  . Alcohol use: Never  . Drug use: No    Home Medications Prior to Admission medications   Medication Sig Start Date End Date Taking? Authorizing Provider  acetaminophen (TYLENOL) 500 MG tablet Take 2 tablets (1,000 mg total) by mouth every 8 (eight) hours. Patient taking differently: Take 1,000 mg by mouth every 8 (eight) hours as needed for moderate pain.  06/10/19 06/09/20  Mercy Riding, MD  amLODipine (NORVASC) 5 MG tablet Take 1 tablet (5 mg total) by mouth daily. 05/17/19   Shelly Coss, MD  augmented betamethasone dipropionate (DIPROLENE-AF) 0.05 % cream Apply 1 application topically 2 (two) times daily as needed (flare ups).  05/08/18   [provider]  bisacodyl (DULCOLAX) 10 MG suppository Place 1 suppository (10 mg total)  rectally as needed for moderate constipation. 06/18/19   Heath Lark, MD  cyclobenzaprine (FLEXERIL) 5 MG tablet Take 1 tablet (5 mg total) by mouth 3 (three) times daily as needed for muscle spasms. 06/18/19   Heath Lark, MD  HYDROmorphone (DILAUDID) 4 MG tablet Take 1 to 2 tablet as needed for breakthrough Patient taking differently: Take 4 mg by mouth every 6 (six) hours as needed for moderate pain or severe pain. Take 1 to 2 tablet as needed for breakthrough 07/07/19   Heath Lark, MD  methadone (DOLOPHINE) 10 MG tablet Take 2 tablets (20 mg total) by mouth every 8 (eight) hours. 06/30/19   Heath Lark, MD  ondansetron (ZOFRAN) 4 MG tablet Take 4 mg by mouth every 8 (eight) hours as needed for nausea or vomiting.    [provider]  polyethylene glycol (MIRALAX / GLYCOLAX) 17 g packet Take 17 g 1-3 times a day as needed for constipation 06/10/19   Wendee Beavers T, MD  predniSONE (DELTASONE) 20 MG tablet Take 1 tablet (20 mg total) by mouth daily with breakfast. 06/18/19   Heath Lark, MD  prochlorperazine (COMPAZINE) 5 MG tablet Take 5 mg by mouth every 6 (six) hours as needed for nausea or vomiting.     [provider]  senna-docusate (SENOKOT-S) 8.6-50 MG tablet Take 1 tablet 1-2 times a day as needed for constipation 06/10/19   Wendee Beavers T, MD    Allergies    Codeine and Levofloxacin  Review of Systems   Review of Systems All systems reviewed and negative, other than as noted in HPI. Physical Exam Updated Vital Signs BP (!) 142/85 (BP Location: Left Arm)   Pulse (!) 124   Temp 98.1 F (36.7 C) (Oral)   Resp 16   LMP 03/26/1979   SpO2 97%   Physical Exam Vitals and nursing note reviewed.  Constitutional:      General: She is not in acute distress.    Appearance: She is well-developed.  HENT:     Head: Normocephalic and atraumatic.  Eyes:     General:        Right eye: No discharge.        Left eye: No discharge.     Conjunctiva/sclera: Conjunctivae  normal.  Cardiovascular:     Rate and Rhythm: Regular rhythm. Tachycardia present.     Heart sounds: Normal heart sounds. No murmur. No friction rub. No gallop.      Comments: Mild tachycardia Pulmonary:     Effort: Pulmonary effort is normal.  No respiratory distress.     Breath sounds: Normal breath sounds.  Abdominal:     General: There is no distension.     Palpations: Abdomen is soft.     Tenderness: There is no abdominal tenderness.  Musculoskeletal:     Cervical back: Neck supple.     Right lower leg: Edema present.     Left lower leg: Edema present.     Comments: Kyphosis/scoliosis.  Back pain is nonreproducible palpation.  Strength is normal bilateral lower extremities.  Sensation intact light touch.  Skin:    General: Skin is warm and dry.  Neurological:     Mental Status: She is alert.     Comments: Standing beside bed when I initially walked in. She was able to take some steps to the bed w/o assistance and sit down. She needed me to help her get her legs onto the bed. This was likely from pain though. Strength in LE 5/5 b/l as tested when laying in bed. Sensation intact to light touch. Patellar reflexes present.   Psychiatric:        Behavior: Behavior normal.        Thought Content: Thought content normal.     ED Results / Procedures / Treatments   Labs (all labs ordered are listed, but only abnormal results are displayed) Labs Reviewed  CBC WITH DIFFERENTIAL/PLATELET - Abnormal; Notable for the following components:      Result Value   RBC 3.63 (*)    MCV 103.0 (*)    All other components within normal limits  BASIC METABOLIC PANEL - Abnormal; Notable for the following components:   Glucose, Bld 121 (*)    BUN 6 (*)    All other components within normal limits  URINALYSIS, ROUTINE W REFLEX MICROSCOPIC - Abnormal; Notable for the following components:   Hgb urine dipstick SMALL (*)    Ketones, ur 5 (*)    All other components within normal limits  SARS  CORONAVIRUS 2 (TAT 6-24 HRS)    EKG None  Radiology MR THORACIC SPINE WO CONTRAST  Result Date: 07/15/2019 CLINICAL DATA:  Low back pain. History of CLL and breast cancer. EXAM: MRI THORACIC AND LUMBAR SPINE WITHOUT CONTRAST TECHNIQUE: Multiplanar and multiecho pulse sequences of the thoracic and lumbar spine were obtained without intravenous contrast. COMPARISON:  CT chest 06/08/2019 FINDINGS: MRI THORACIC SPINE FINDINGS Alignment: Marked S shaped scoliosis in the thoracic and upper lumbar spine. Normal alignment Vertebrae: Moderate compression fracture inferior endplate of T8 appears chronic and unchanged from the prior CT Moderately severe chronic compression fracture T11 appears chronic and also unchanged from the prior CT. Negative for acute thoracic fracture. Cord:  Normal cord signal. No cord compression. Paraspinal and other soft tissues: No paraspinous mass. No pleural effusion. Disc levels: Multilevel degenerative change. Posterior spurring at T11-T12 without significant spinal stenosis. No focal disc protrusion. MRI LUMBAR SPINE FINDINGS Segmentation:  Normal Alignment:  Mild retrolisthesis L2-3. Scoliosis. Vertebrae: Mild superior endplate fracture of L1 with bone marrow edema. This was not present on 06/08/2019 CT. No other lumbar fracture or mass Conus medullaris and cauda equina: Conus extends to the L1-2 level. Conus and cauda equina appear normal. Paraspinal and other soft tissues: Negative for paraspinous mass or adenopathy. No significant soft tissue edema Disc levels: T12-L1: Mild retropulsion of L1 into the canal without significant spinal stenosis L1-2: Disc bulging and mild endplate spurring without stenosis. L2-3: Diffuse endplate spurring. Mild subarticular stenosis on the left. Mild spinal stenosis  L3-4: Mild disc bulging and mild facet degeneration. Mild subarticular stenosis on the right L4-5: Moderate to advanced facet degeneration bilaterally causing moderate subarticular  stenosis bilaterally. Mild spinal stenosis L5-S1: Moderate disc degeneration without stenosis IMPRESSION: 1. Mild superior endplate fracture of L1 appears acute or subacute and was not present on 06/08/19 CT. 2. Chronic compression fractures T8 and T11. 3. Marked S shaped scoliosis. Multilevel degenerative change in the lumbar spine as above. Electronically Signed   By: Franchot Gallo M.D.   On: 07/15/2019 19:36   MR LUMBAR SPINE WO CONTRAST  Result Date: 07/15/2019 CLINICAL DATA:  Low back pain. History of CLL and breast cancer. EXAM: MRI THORACIC AND LUMBAR SPINE WITHOUT CONTRAST TECHNIQUE: Multiplanar and multiecho pulse sequences of the thoracic and lumbar spine were obtained without intravenous contrast. COMPARISON:  CT chest 06/08/2019 FINDINGS: MRI THORACIC SPINE FINDINGS Alignment: Marked S shaped scoliosis in the thoracic and upper lumbar spine. Normal alignment Vertebrae: Moderate compression fracture inferior endplate of T8 appears chronic and unchanged from the prior CT Moderately severe chronic compression fracture T11 appears chronic and also unchanged from the prior CT. Negative for acute thoracic fracture. Cord:  Normal cord signal. No cord compression. Paraspinal and other soft tissues: No paraspinous mass. No pleural effusion. Disc levels: Multilevel degenerative change. Posterior spurring at T11-T12 without significant spinal stenosis. No focal disc protrusion. MRI LUMBAR SPINE FINDINGS Segmentation:  Normal Alignment:  Mild retrolisthesis L2-3. Scoliosis. Vertebrae: Mild superior endplate fracture of L1 with bone marrow edema. This was not present on 06/08/2019 CT. No other lumbar fracture or mass Conus medullaris and cauda equina: Conus extends to the L1-2 level. Conus and cauda equina appear normal. Paraspinal and other soft tissues: Negative for paraspinous mass or adenopathy. No significant soft tissue edema Disc levels: T12-L1: Mild retropulsion of L1 into the canal without significant  spinal stenosis L1-2: Disc bulging and mild endplate spurring without stenosis. L2-3: Diffuse endplate spurring. Mild subarticular stenosis on the left. Mild spinal stenosis L3-4: Mild disc bulging and mild facet degeneration. Mild subarticular stenosis on the right L4-5: Moderate to advanced facet degeneration bilaterally causing moderate subarticular stenosis bilaterally. Mild spinal stenosis L5-S1: Moderate disc degeneration without stenosis IMPRESSION: 1. Mild superior endplate fracture of L1 appears acute or subacute and was not present on 06/08/19 CT. 2. Chronic compression fractures T8 and T11. 3. Marked S shaped scoliosis. Multilevel degenerative change in the lumbar spine as above. Electronically Signed   By: Franchot Gallo M.D.   On: 07/15/2019 19:36    Procedures Procedures (including critical care time)  Medications Ordered in ED Medications  ketorolac (TORADOL) 15 MG/ML injection 7.5 mg (7.5 mg Intravenous Given 07/15/19 1556)  HYDROmorphone (DILAUDID) injection 0.5 mg (0.5 mg Intravenous Given 07/15/19 1557)  LORazepam (ATIVAN) injection 0.5 mg (0.5 mg Intravenous Given 07/15/19 1556)  HYDROmorphone (DILAUDID) injection 0.75 mg (0.75 mg Intravenous Given 07/15/19 1813)  LORazepam (ATIVAN) injection 0.5 mg (0.5 mg Intravenous Given 07/15/19 1813)  naloxone (NARCAN) 2 MG/2ML injection (0.25 mg  Given 07/15/19 2015)    ED Course  I have reviewed the triage vital signs and the nursing notes.  Pertinent labs & imaging results that were available during my care of the patient were reviewed by me and considered in my medical decision making (see chart for details).    MDM Rules/Calculators/A&P  84 year old female with worsening back pain.  Denies any acute trauma or strain.  She has been taking methadone at home but has had uncontrolled pain despite this.  She could not tolerate an MRI earlier today.  We will try to get her symptoms under reasonable to attempt again.  CT approximately  5 to 6 weeks ago showing compression fractures of T8 and new appearing compression fracture of T11.  No suspicious osseous lesions noted.  Suspect her pain is probably from compression fractures.  On assessment after returning from MRI pt was very drowsy. Snoring and hard to awaken. o2 sats dropping down into 80s at times on RA. Given 0.25 mg of narcan. Brisk response. More awake although confused. Mumbling to self. o2 sats improving into high 90s on RA.   Over an hour after narcan her sats remain fine. She is protecting her airway. I cannot arouse her long enough though to get her to sit up in bed or answer a question w/o falling asleep in mid sentence. Has been in the ED over 9 hours at this point. Will admit for observation overnight. Husband updated.   Final Clinical Impression(s) / ED Diagnoses Final diagnoses:  Compression fracture of L1 vertebra, initial encounter [facility])    Rx / Cache Orders ED Discharge Orders    None       Virgel Manifold, MD 07/15/19 2233

## 2019-07-15 NOTE — ED Notes (Signed)
Assisted patient to the restroom with two person assist and one person assist returning back to stretcher.

## 2019-07-15 NOTE — ED Notes (Signed)
Patient is resting quietly with eyes closed but will respond with voice command. Able to follow simple commands.

## 2019-07-15 NOTE — Progress Notes (Signed)
Lovenox per Pharmacy for DVT Prophylaxis    Pharmacy has been consulted from dosing enoxaparin (lovenox) in this patient for DVT prophylaxis.  The pharmacist has reviewed pertinent labs (Hgb _12.3__; PLT_196__), patient weight (_44__kg) and renal function (CrCl_35.6__mL/min) and decided that enoxaparin _30_mg SQ Q24Hrs is appropriate for this patient.  The pharmacy department will sign off at this time.  Please reconsult pharmacy if status changes or for further issues.  Thank you  Cyndia Diver PharmD, BCPS  07/15/2019, 11:26 PM

## 2019-07-15 NOTE — ED Notes (Signed)
Patient remains in MRI 

## 2019-07-15 NOTE — ED Notes (Addendum)
Pt. Oxygen level decreased to 84% when she came back from MRI, new pulse ox placed on pt and O2 came back up to 94%. RN, Di Kindle made aware and EDP,[place].

## 2019-07-15 NOTE — Progress Notes (Signed)
Pt in too much pain. Pt unable to lay on table for more than two mins. All avenues were exhausted in attempting to get pt in the scanner and be able to lay on the table. Pt will need pain meds or might needed to be sedated.

## 2019-07-15 NOTE — Telephone Encounter (Signed)
She and her husband came to the lobby after leaving MRI. She was unable to complete the MRI because she could not lay down due to the pain. She is having uncontrollable pain and is requesting to go to the ER. Taken via w/c to ER with husband at side. Tomorrow's appt canceled for tomorrow with Dr. Alvy Bimler. She and her husband are aware that appt canceled.

## 2019-07-15 NOTE — ED Notes (Signed)
Patient is arousing more and intermittent moaning. Eyes remained closed.

## 2019-07-15 NOTE — ED Notes (Signed)
Transported to MRI

## 2019-07-15 NOTE — ED Triage Notes (Signed)
Pt reports was here to have an MRI on her back bc having pains today but pains were too bad not able to have test.

## 2019-07-16 ENCOUNTER — Telehealth: Payer: Self-pay | Admitting: Internal Medicine

## 2019-07-16 ENCOUNTER — Inpatient Hospital Stay: Payer: Medicare Other | Admitting: Hematology and Oncology

## 2019-07-16 DIAGNOSIS — S32010A Wedge compression fracture of first lumbar vertebra, initial encounter for closed fracture: Secondary | ICD-10-CM

## 2019-07-16 DIAGNOSIS — R5381 Other malaise: Secondary | ICD-10-CM | POA: Diagnosis not present

## 2019-07-16 DIAGNOSIS — R52 Pain, unspecified: Secondary | ICD-10-CM

## 2019-07-16 DIAGNOSIS — K5900 Constipation, unspecified: Secondary | ICD-10-CM | POA: Diagnosis present

## 2019-07-16 DIAGNOSIS — T40695A Adverse effect of other narcotics, initial encounter: Secondary | ICD-10-CM | POA: Diagnosis not present

## 2019-07-16 DIAGNOSIS — Z820 Family history of epilepsy and other diseases of the nervous system: Secondary | ICD-10-CM | POA: Diagnosis not present

## 2019-07-16 DIAGNOSIS — Z853 Personal history of malignant neoplasm of breast: Secondary | ICD-10-CM | POA: Diagnosis not present

## 2019-07-16 DIAGNOSIS — Z7952 Long term (current) use of systemic steroids: Secondary | ICD-10-CM | POA: Diagnosis not present

## 2019-07-16 DIAGNOSIS — Z807 Family history of other malignant neoplasms of lymphoid, hematopoietic and related tissues: Secondary | ICD-10-CM | POA: Diagnosis not present

## 2019-07-16 DIAGNOSIS — Z9013 Acquired absence of bilateral breasts and nipples: Secondary | ICD-10-CM | POA: Diagnosis not present

## 2019-07-16 DIAGNOSIS — M8088XA Other osteoporosis with current pathological fracture, vertebra(e), initial encounter for fracture: Secondary | ICD-10-CM | POA: Diagnosis present

## 2019-07-16 DIAGNOSIS — G8929 Other chronic pain: Secondary | ICD-10-CM | POA: Diagnosis present

## 2019-07-16 DIAGNOSIS — R4182 Altered mental status, unspecified: Secondary | ICD-10-CM | POA: Diagnosis not present

## 2019-07-16 DIAGNOSIS — G9341 Metabolic encephalopathy: Secondary | ICD-10-CM | POA: Diagnosis not present

## 2019-07-16 DIAGNOSIS — Z9181 History of falling: Secondary | ICD-10-CM | POA: Diagnosis not present

## 2019-07-16 DIAGNOSIS — M419 Scoliosis, unspecified: Secondary | ICD-10-CM | POA: Diagnosis present

## 2019-07-16 DIAGNOSIS — Z8572 Personal history of non-Hodgkin lymphomas: Secondary | ICD-10-CM | POA: Diagnosis not present

## 2019-07-16 DIAGNOSIS — Z885 Allergy status to narcotic agent status: Secondary | ICD-10-CM | POA: Diagnosis not present

## 2019-07-16 DIAGNOSIS — Z888 Allergy status to other drugs, medicaments and biological substances status: Secondary | ICD-10-CM | POA: Diagnosis not present

## 2019-07-16 DIAGNOSIS — C9111 Chronic lymphocytic leukemia of B-cell type in remission: Secondary | ICD-10-CM

## 2019-07-16 DIAGNOSIS — S32009A Unspecified fracture of unspecified lumbar vertebra, initial encounter for closed fracture: Secondary | ICD-10-CM | POA: Diagnosis not present

## 2019-07-16 DIAGNOSIS — M545 Low back pain: Secondary | ICD-10-CM | POA: Diagnosis present

## 2019-07-16 DIAGNOSIS — Z8249 Family history of ischemic heart disease and other diseases of the circulatory system: Secondary | ICD-10-CM | POA: Diagnosis not present

## 2019-07-16 DIAGNOSIS — C9110 Chronic lymphocytic leukemia of B-cell type not having achieved remission: Secondary | ICD-10-CM | POA: Diagnosis present

## 2019-07-16 DIAGNOSIS — Z20828 Contact with and (suspected) exposure to other viral communicable diseases: Secondary | ICD-10-CM | POA: Diagnosis present

## 2019-07-16 DIAGNOSIS — M549 Dorsalgia, unspecified: Secondary | ICD-10-CM | POA: Diagnosis not present

## 2019-07-16 DIAGNOSIS — Z79899 Other long term (current) drug therapy: Secondary | ICD-10-CM | POA: Diagnosis not present

## 2019-07-16 DIAGNOSIS — R000 Tachycardia, unspecified: Secondary | ICD-10-CM | POA: Diagnosis present

## 2019-07-16 DIAGNOSIS — G92 Toxic encephalopathy: Secondary | ICD-10-CM | POA: Diagnosis not present

## 2019-07-16 DIAGNOSIS — R296 Repeated falls: Secondary | ICD-10-CM | POA: Diagnosis present

## 2019-07-16 DIAGNOSIS — Z806 Family history of leukemia: Secondary | ICD-10-CM | POA: Diagnosis not present

## 2019-07-16 DIAGNOSIS — Y92238 Other place in hospital as the place of occurrence of the external cause: Secondary | ICD-10-CM | POA: Diagnosis not present

## 2019-07-16 DIAGNOSIS — Z87891 Personal history of nicotine dependence: Secondary | ICD-10-CM | POA: Diagnosis not present

## 2019-07-16 LAB — CBC
HCT: 34.8 % — ABNORMAL LOW (ref 36.0–46.0)
Hemoglobin: 11.3 g/dL — ABNORMAL LOW (ref 12.0–15.0)
MCH: 33.6 pg (ref 26.0–34.0)
MCHC: 32.5 g/dL (ref 30.0–36.0)
MCV: 103.6 fL — ABNORMAL HIGH (ref 80.0–100.0)
Platelets: 154 10*3/uL (ref 150–400)
RBC: 3.36 MIL/uL — ABNORMAL LOW (ref 3.87–5.11)
RDW: 14.5 % (ref 11.5–15.5)
WBC: 6.6 10*3/uL (ref 4.0–10.5)
nRBC: 0 % (ref 0.0–0.2)

## 2019-07-16 LAB — SARS CORONAVIRUS 2 (TAT 6-24 HRS): SARS Coronavirus 2: NEGATIVE

## 2019-07-16 MED ORDER — ACETAMINOPHEN 325 MG PO TABS: 650.0000 mg | ORAL_TABLET | Freq: Four times a day (QID) | ORAL | Status: DC | PRN

## 2019-07-16 MED ORDER — AMLODIPINE BESYLATE 5 MG PO TABS
5.0000 mg | ORAL_TABLET | Freq: Every day | ORAL | Status: DC
  Administered 2019-07-16 – 2019-07-18 (×3): 5 mg via ORAL
  Filled 2019-07-16 (×3): qty 1

## 2019-07-16 MED ORDER — KETOROLAC TROMETHAMINE 15 MG/ML IJ SOLN
15.0000 mg | Freq: Four times a day (QID) | INTRAMUSCULAR | Status: DC | PRN
  Administered 2019-07-16 – 2019-07-18 (×6): 15 mg via INTRAVENOUS
  Filled 2019-07-16 (×6): qty 1

## 2019-07-16 NOTE — Progress Notes (Signed)
Patient very drowsy at beginning of shift, respond to voice,vital signs within normal limits. Dr Chauncey Cruel. Wyline Copas made aware of that.

## 2019-07-16 NOTE — Telephone Encounter (Signed)
Prolia discussed at annul see below. Will remove pt from Prolia list alternative may be discussed later    She is on Prolia for 6 years now.  The issue as to whether to switch to a different medication such as Evinity or Forteo discussed.  She is being actively treated for CLL.  At this point because of her pain and other issues will hold on switching medications and get her through her this acute event.  We will then plan on discussion about switching medications.

## 2019-07-16 NOTE — Progress Notes (Signed)
IR requested by Dr. Wyline Copas for management of L1 compression fracture.  Patient with history of CLL, scoliosis, osteoporosis, and chronic low back pain. She is known to IR- she consulted with Dr. Anselm Pancoast on an outpatient basis 07/01/2019. At that time, patient was referred to Dr. Anselm Pancoast for management of T8 and T11 compression fractures. At that time, patient was not able to localize pain and was recommended that patient undergo MRI thoracic and lumbar spine for further evaluation of T8/T11 vertebral bodies, along with excluding an other areas of potential disease. She was scheduled for these imaging scans 07/15/2019, however could not tolerate exam secondary to pain. Because of this, she presented to [facility] ED 06/15/2019 for further management of low back pain. In ED, patient was sedated and MRI thoracic/lumbar spine was preformed, revealing chronic/healing compression fractures of T8/T11, along with acute/subacute L1 compression fracture. She was admitted for pain control and further management. Of note, patient was given two doses of IV lorazepam and Dilaudid in the ED before getting her MRI and was finally given Narcan after she became too drowsy and confused.  MRI thoracic/lumbar spine 07/15/2019: 1. Mild superior endplate fracture of L1 appears acute or subacute and was not present on 06/08/19 CT. 2. Chronic compression fractures T8 and T11. 3. Marked S shaped scoliosis.  Went to evaluated patient bedside alongside Dr. Anselm Pancoast. Patient awake and alert, however appears confused today. She is oriented to self, however states "I do not know where I am or how I got here", and states it is the year 2021. States that she "hurst everywhere" and "I cant look to the left because my neck hurts". On PE, patient's pain is not able to be clearly localized- states that she hurts almost everywhere you touch her. No evidence of skin breakdown or bleeding.  Patient appears confused today, in addition is not able to clearly  localize pain. If procedure were to occur, it is possible that it will not take her pain away. Recommend following patient's clinical symptoms in hopes that she will be more alert and able to clearly differentiate where her back pain is located. Further plans per [facility]- appreciate and agree with management.  IR to follow.   April Graff Yida Hyams, PA-C 07/16/2019, 5:56 PM

## 2019-07-16 NOTE — Progress Notes (Signed)
PROGRESS NOTE    April [name]  [id number] DOB: 15-Jan-1935 DOA: 07/15/2019 PCP: Isaac Bliss, Rayford Halsted, MD    Brief Narrative:  84 y.o. female with medical history significant of chronic back pain who initially presented to the hospital for getting her scheduled MRI for her back pain but was unable to get the MRI because of severe pain and could not lay down due to pain. Patient was given two doses of lorazepam and two doses of Dilaudid in the ER and MRI was done. As soon as patient came from MRI she was very drowsy and poorly responsive. Oxygen saturation also dropped to 80s in the meantime and patient was given Narcan that resulted in very quick response and patient became more alert but remained confused. Patient was admitted for observation overnight for acute metabolic encephalopathy.     During my evaluation, patient finds her eyes to verbal commands and also able to follow small commands like squeezing the fingers and moving her feet but still very drowsy and quickly go back to sleep. ED Course: Patient was given two doses of IV lorazepam and Dilaudid in the ED before getting her MRI and was finally given Narcan after she became too drowsy and confused  Assessment & Plan:   Active Problems:   Acute metabolic encephalopathy  Active Problems:   Acute toxic metabolic encephalopathy -Noted to be acute after receiving ativan and dilaudid -Patient's condition improved after Narcan.  -Pt seems to be more alert this AM -Avoid oversedation if possible  Severe back pain: -MRI of lumbar region showed acute endplate fracture of L1 while thoracic MRI showed chronic compression fractures of T8 and T11. -Chart reviewed and case discussed with patient's Oncologist. Patient has very difficult to control back pain and was referred to Palliative Care as outpatient -Pt thus far failed multiple narcotic regimen including methadone. -Narcotics currently on hold to allow pt to be more alert,  although at this time, pt is visibly writhing in pain -Agree with IR eval to see if kyphoplasty is an option for this patient, pending recs -Once patient becomes more alert, can consider cautiously resuming narcotics as tolerated  CLL -Discussed with pt's primary Oncologist, noted to be in remission -Repeat CBC in AM  DVT prophylaxis: Lovenox subQ Code Status: Full Family Communication: Pt in room, family not at bedside this AM Disposition Plan: Uncertain at this time  Consultants:   IR  Oncology  Procedures:     Antimicrobials: Anti-infectives (From admission, onward)   None       Subjective: Complaining of continued back pain  Objective: Vitals:   07/16/19 0505 07/16/19 0942 07/16/19 0950 07/16/19 1527  BP: 129/88 (!) 148/69 (!) 153/72 (!) 155/70  Pulse: 91 98 89 98  Resp: 20  18 18   Temp: 98.7 F (37.1 C) (!) 97.5 F (36.4 C) (!) 97.5 F (36.4 C) 100.1 F (37.8 C)  TempSrc: Oral Oral Oral Oral  SpO2: 97% 100% 99% 96%  Weight:      Height:        Intake/Output Summary (Last 24 hours) at 07/16/2019 1650 Last data filed at 07/16/2019 0444 Gross per 24 hour  Intake 510 ml  Output --  Net 510 ml   Filed Weights   07/16/19 0102  Weight: 44.6 kg    Examination: General exam: Appears calm and comfortable  Respiratory system: Clear to auscultation. Respiratory effort normal. Cardiovascular system: S1 & S2 heard, Regular Gastrointestinal system: Abdomen is nondistended, soft and nontender. No  organomegaly or masses felt. Normal bowel sounds heard. Central nervous system:Drowsy, arousable. No focal neurological deficits. Extremities: Symmetric 5 x 5 power. Skin: No rashes, lesions Psychiatry: Appears confused this AM   Data Reviewed: I have personally reviewed following labs and imaging studies  CBC: Recent Labs  Lab 07/09/19 1721 07/15/19 1542 07/16/19 0503  WBC 5.7 8.4 6.6  NEUTROABS  --  5.2  --   HGB 11.4* 12.3 11.3*  HCT 36.0 37.4 34.8*   MCV 106.5* 103.0* 103.6*  PLT 196 196 [id number]   Basic Metabolic Panel: Recent Labs  Lab 07/09/19 1721 07/15/19 1542  NA 138 135  K 4.4 3.5  CL 102 98  CO2 25 26  GLUCOSE 110* 121*  BUN 5* 6*  CREATININE 0.54 0.54  CALCIUM 9.5 9.4   GFR: Estimated Creatinine Clearance: 35.6 mL/min (by C-G formula based on SCr of 0.54 mg/dL). Liver Function Tests: No results for input(s): AST, ALT, ALKPHOS, BILITOT, PROT, ALBUMIN in the last 168 hours. No results for input(s): LIPASE, AMYLASE in the last 168 hours. No results for input(s): AMMONIA in the last 168 hours. Coagulation Profile: No results for input(s): INR, PROTIME in the last 168 hours. Cardiac Enzymes: No results for input(s): CKTOTAL, CKMB, CKMBINDEX, TROPONINI in the last 168 hours. BNP (last 3 results) No results for input(s): PROBNP in the last 8760 hours. HbA1C: No results for input(s): HGBA1C in the last 72 hours. CBG: No results for input(s): GLUCAP in the last 168 hours. Lipid Profile: No results for input(s): CHOL, HDL, LDLCALC, TRIG, CHOLHDL, LDLDIRECT in the last 72 hours. Thyroid Function Tests: No results for input(s): TSH, T4TOTAL, FREET4, T3FREE, THYROIDAB in the last 72 hours. Anemia Panel: No results for input(s): VITAMINB12, FOLATE, FERRITIN, TIBC, IRON, RETICCTPCT in the last 72 hours. Sepsis Labs: No results for input(s): PROCALCITON, LATICACIDVEN in the last 168 hours.  Recent Results (from the past 240 hour(s))  SARS CORONAVIRUS 2 (TAT 6-24 HRS) Nasopharyngeal Nasopharyngeal Swab     Status: None   Collection Time: 07/15/19 11:41 PM   Specimen: Nasopharyngeal Swab  Result Value Ref Range Status   SARS Coronavirus 2 NEGATIVE NEGATIVE Final    Comment: (NOTE) SARS-CoV-2 target nucleic acids are NOT DETECTED. The SARS-CoV-2 RNA is generally detectable in upper and lower respiratory specimens during the acute phase of infection. Negative results do not preclude SARS-CoV-2 infection, do not rule  out co-infections with other pathogens, and should not be used as the sole basis for treatment or other patient management decisions. Negative results must be combined with clinical observations, patient history, and epidemiological information. The expected result is Negative. Fact Sheet for Patients: SugarRoll.be Fact Sheet for Healthcare Providers: https://www.woods-mathews.com/ This test is not yet approved or cleared by the [place] FDA and  has been authorized for detection and/or diagnosis of SARS-CoV-2 by FDA under an Emergency Use Authorization (EUA). This EUA will remain  in effect (meaning this test can be used) for the duration of the COVID-19 declaration under Section 56 4(b)(1) of the Act, 21 U.S.C. section 360bbb-3(b)(1), unless the authorization is terminated or revoked sooner. Performed at [facility], [place] [address] [place], [place] [address]      Radiology Studies: MR THORACIC SPINE WO CONTRAST  Result Date: 07/15/2019 CLINICAL DATA:  Low back pain. History of CLL and breast cancer. EXAM: MRI THORACIC AND LUMBAR SPINE WITHOUT CONTRAST TECHNIQUE: Multiplanar and multiecho pulse sequences of the thoracic and lumbar spine were obtained without intravenous contrast. COMPARISON:  CT chest 06/08/2019 FINDINGS: MRI THORACIC SPINE FINDINGS Alignment: Marked S shaped scoliosis in the thoracic and upper lumbar spine. Normal alignment Vertebrae: Moderate compression fracture inferior endplate of T8 appears chronic and unchanged from the prior CT Moderately severe chronic compression fracture T11 appears chronic and also unchanged from the prior CT. Negative for acute thoracic fracture. Cord:  Normal cord signal. No cord compression. Paraspinal and other soft tissues: No paraspinous mass. No pleural effusion. Disc levels: Multilevel degenerative change. Posterior spurring at T11-T12 without significant spinal stenosis. No focal  disc protrusion. MRI LUMBAR SPINE FINDINGS Segmentation:  Normal Alignment:  Mild retrolisthesis L2-3. Scoliosis. Vertebrae: Mild superior endplate fracture of L1 with bone marrow edema. This was not present on 06/08/2019 CT. No other lumbar fracture or mass Conus medullaris and cauda equina: Conus extends to the L1-2 level. Conus and cauda equina appear normal. Paraspinal and other soft tissues: Negative for paraspinous mass or adenopathy. No significant soft tissue edema Disc levels: T12-L1: Mild retropulsion of L1 into the canal without significant spinal stenosis L1-2: Disc bulging and mild endplate spurring without stenosis. L2-3: Diffuse endplate spurring. Mild subarticular stenosis on the left. Mild spinal stenosis L3-4: Mild disc bulging and mild facet degeneration. Mild subarticular stenosis on the right L4-5: Moderate to advanced facet degeneration bilaterally causing moderate subarticular stenosis bilaterally. Mild spinal stenosis L5-S1: Moderate disc degeneration without stenosis IMPRESSION: 1. Mild superior endplate fracture of L1 appears acute or subacute and was not present on 06/08/19 CT. 2. Chronic compression fractures T8 and T11. 3. Marked S shaped scoliosis. Multilevel degenerative change in the lumbar spine as above. Electronically Signed   By: Franchot Gallo M.D.   On: 07/15/2019 19:36   MR LUMBAR SPINE WO CONTRAST  Result Date: 07/15/2019 CLINICAL DATA:  Low back pain. History of CLL and breast cancer. EXAM: MRI THORACIC AND LUMBAR SPINE WITHOUT CONTRAST TECHNIQUE: Multiplanar and multiecho pulse sequences of the thoracic and lumbar spine were obtained without intravenous contrast. COMPARISON:  CT chest 06/08/2019 FINDINGS: MRI THORACIC SPINE FINDINGS Alignment: Marked S shaped scoliosis in the thoracic and upper lumbar spine. Normal alignment Vertebrae: Moderate compression fracture inferior endplate of T8 appears chronic and unchanged from the prior CT Moderately severe chronic  compression fracture T11 appears chronic and also unchanged from the prior CT. Negative for acute thoracic fracture. Cord:  Normal cord signal. No cord compression. Paraspinal and other soft tissues: No paraspinous mass. No pleural effusion. Disc levels: Multilevel degenerative change. Posterior spurring at T11-T12 without significant spinal stenosis. No focal disc protrusion. MRI LUMBAR SPINE FINDINGS Segmentation:  Normal Alignment:  Mild retrolisthesis L2-3. Scoliosis. Vertebrae: Mild superior endplate fracture of L1 with bone marrow edema. This was not present on 06/08/2019 CT. No other lumbar fracture or mass Conus medullaris and cauda equina: Conus extends to the L1-2 level. Conus and cauda equina appear normal. Paraspinal and other soft tissues: Negative for paraspinous mass or adenopathy. No significant soft tissue edema Disc levels: T12-L1: Mild retropulsion of L1 into the canal without significant spinal stenosis L1-2: Disc bulging and mild endplate spurring without stenosis. L2-3: Diffuse endplate spurring. Mild subarticular stenosis on the left. Mild spinal stenosis L3-4: Mild disc bulging and mild facet degeneration. Mild subarticular stenosis on the right L4-5: Moderate to advanced facet degeneration bilaterally causing moderate subarticular stenosis bilaterally. Mild spinal stenosis L5-S1: Moderate disc degeneration without stenosis IMPRESSION: 1. Mild superior endplate fracture of L1 appears acute or subacute and was not present on 06/08/19 CT. 2. Chronic compression fractures T8  and T11. 3. Marked S shaped scoliosis. Multilevel degenerative change in the lumbar spine as above. Electronically Signed   By: Franchot Gallo M.D.   On: 07/15/2019 19:36    Scheduled Meds: . enoxaparin (LOVENOX) injection  30 mg Subcutaneous QHS   Continuous Infusions: . sodium chloride 100 mL/hr at 07/16/19 1000     LOS: 0 days   Marylu Lund, MD [facility] Pager On Amion  If 7PM-7AM, please contact  night-coverage 07/16/2019, 4:50 PM

## 2019-07-16 NOTE — Progress Notes (Signed)
:  Patient was a yellow MEWS in the ED due to LOC / pulse. She went to green before coming to the floor however based off of her assessment I completed her LOC still keeps her in the yellow - MEWS protocol not followed due to no acute changes ( LOC is exactly what was described in the ED before transfer).

## 2019-07-16 NOTE — Progress Notes (Addendum)
[facility]   Patient is active with [facility]. She was scheduled for a follow up visit today and niece called to make [place] aware she is hospitalized. Will continue to follow and and assist with planning and services moving forward.   Please do not hesitate to call with questions.   Erling Conte, LCSW [phone]  [facility] liaisons are listed daily on AMION under Hospice and [place]

## 2019-07-16 NOTE — Telephone Encounter (Signed)
April [name] with [facility] called in to follow up on VO for PT for date of 07/07/19 and also 07/13/19   Please assist.    [phone] Fax  [phone] phone

## 2019-07-16 NOTE — Patient Care Conference (Signed)
Called and updated patient's husband over phone. All questions answered.

## 2019-07-16 NOTE — Progress Notes (Signed)
The patient was supposed to see me/scheduled an E visit today to discuss pain management She went to the emergency room yesterday after MRI scan The patient was in a lot of pain prior to MRI scan and received some pain medicine prior to the scan Afterward, she was noted to have altered mental status and was subsequently admitted This morning, she is alert and appears to be trashing around her bed She recognized me but cannot say my name She is concerned about the fact that she has not drink or eat anything recently The MRI scan show evidence of vertebral fractures; the patient had history of multiple falls on background history of osteoporosis and scoliosis Over the past month, I have attempted to schedule E-visit every week to manage her pain Despite my best intention of keeping her out of the hospital, she has been to the emergency room several times along with this current admission From previous admission, palliative care has been consulted but even with palliative care consult, my weekly visit and her primary care doctor's visit, the patient continues to deteriorate clinically I have consulted interventional radiologist to manage her pain and to consider kyphoplasty.  The MRI that she had yesterday was ordered by the interventional radiologist to evaluate further  Final plan and recommendation  #1 CLL Her CLL is in remission She does not need treatment.  At most, she will need a 6 months follow-up for CLL  #2 uncontrolled pain secondary to vertebral fractures, complicated by multiple episodes of confusion The patient tolerated treatment poorly.  She has been prescribed hydrocodone, oxycodone, methadone, morphine sulfate and hydromorphone in different orders and despite these, the patient continues to tolerate pain medicine poorly.  I recommend IR to see her while hospitalized to consider kyphoplasty if it is appropriate course of action  #3 debility with multiple falls and  hospitalization She has failed outpatient management.  I recommend consideration for skilled nursing facility upon discharge  Please call if questions arise Heath Lark, MD

## 2019-07-16 NOTE — H&P (Signed)
History and Physical    April [name] [id number] DOB: 02/23/1935 DOA: 07/15/2019  PCP: Isaac Bliss, Rayford Halsted, MD (Confirm with patient/family/NH records and if not entered, this has to be entered at [facility] point of entry) Patient coming from: Home  I have personally briefly reviewed patient's old medical records in [place]  Chief Complaint: Acute metabolic encephalopathy  HPI: April [name] is a 84 y.o. female with medical history significant of chronic back pain who initially presented to the hospital for getting her scheduled MRI for her back pain but was unable to get the MRI because of severe pain and could not lay down due to pain. Patient was given two doses of lorazepam and two doses of Dilaudid in the ER and MRI was done. As soon as patient came from MRI she was very drowsy and poorly responsive. Oxygen saturation also dropped to 80s in the meantime and patient was given Narcan that resulted in very quick response and patient became more alert but remained confused. Patient was admitted for observation overnight for acute metabolic encephalopathy.     During my evaluation, patient finds her eyes to verbal commands and also able to follow small commands like squeezing the fingers and moving her feet but still very drowsy and quickly go back to sleep. ED Course: Patient was given two doses of IV lorazepam and Dilaudid in the ED before getting her MRI and was finally given Narcan after she became too drowsy and confused  Review of Systems: Review of system could not be obtained because of patient's current condition.   Past Medical History:  Diagnosis Date  . Breast CA ([place])   . Bronchitis, chronic ([place])   . Eye hemorrhage, left   . 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 Osteoporosis 05/2018   T score -3.1 overall stable from prior study    Past Surgical History:  Procedure Laterality Date  . ABDOMINAL  HYSTERECTOMY  1980  . BREAST IMPLANTS REMOVED  2001  . BREAST SURGERY     Bilateral mastectomy  . IR RADIOLOGIST EVAL & MGMT  07/01/2019  . MASTECTOMY  1982   BILATERAL  . OOPHORECTOMY     BSO  . PARTIAL COLECTOMY     INTESTINAL ABSCESS WITH SALPINGECTOMY  . RECONSTRUCTION LEFT ELBOW    . TONSILLECTOMY    . UMBILLICAL HERNIA REPAIR       reports that she quit smoking about 64 years ago. Her smoking use included cigarettes. She has never used smokeless tobacco. She reports that she does not drink alcohol or use drugs.  Allergies  Allergen Reactions  . Codeine Nausea Only  . Levofloxacin Rash    Family History  Problem Relation Age of Onset  . Cancer Brother        HODGKINS  . Cancer Brother        leukemia  . Parkinson's disease Brother   . Heart disease Mother   . Heart disease Sister    Family history is obtained from medical records.  Prior to Admission medications   Medication Sig Start Date End Date Taking? Authorizing Provider  acetaminophen (TYLENOL) 500 MG tablet Take 2 tablets (1,000 mg total) by mouth every 8 (eight) hours. Patient taking differently: Take 1,000 mg by mouth every 8 (eight) hours as needed for moderate pain.  06/10/19 06/09/20 Yes Mercy Riding, MD  amLODipine (NORVASC) 5 MG tablet Take 1 tablet (  5 mg total) by mouth daily. 05/17/19  Yes Shelly Coss, MD  augmented betamethasone dipropionate (DIPROLENE-AF) 0.05 % cream Apply 1 application topically 2 (two) times daily as needed (flare ups).  05/08/18  Yes [provider]  bisacodyl (DULCOLAX) 10 MG suppository Place 1 suppository (10 mg total) rectally as needed for moderate constipation. 06/18/19  Yes Gorsuch, Ni, MD  cyclobenzaprine (FLEXERIL) 5 MG tablet Take 1 tablet (5 mg total) by mouth 3 (three) times daily as needed for muscle spasms. 06/18/19  Yes Gorsuch, Ni, MD  HYDROmorphone (DILAUDID) 4 MG tablet Take 1 to 2 tablet as needed for breakthrough Patient taking differently: Take  4 mg by mouth every 6 (six) hours as needed for moderate pain or severe pain. Take 1 to 2 tablet as needed for breakthrough 07/07/19  Yes Gorsuch, Ni, MD  methadone (DOLOPHINE) 10 MG tablet Take 2 tablets (20 mg total) by mouth every 8 (eight) hours. 06/30/19  Yes Gorsuch, Ni, MD  ondansetron (ZOFRAN) 4 MG tablet Take 4 mg by mouth every 8 (eight) hours as needed for nausea or vomiting.   Yes [provider]  polyethylene glycol (MIRALAX / GLYCOLAX) 17 g packet Take 17 g 1-3 times a day as needed for constipation 06/10/19  Yes Gonfa, Taye T, MD  predniSONE (DELTASONE) 20 MG tablet Take 1 tablet (20 mg total) by mouth daily with breakfast. 06/18/19  Yes Gorsuch, Ni, MD  prochlorperazine (COMPAZINE) 5 MG tablet Take 5 mg by mouth every 6 (six) hours as needed for nausea or vomiting.    Yes [provider]  senna-docusate (SENOKOT-S) 8.6-50 MG tablet Take 1 tablet 1-2 times a day as needed for constipation 06/10/19  Yes Mercy Riding, MD    Physical Exam: Vitals:   07/15/19 2300 07/15/19 2330 07/16/19 0035 07/16/19 0102  BP: (!) 147/80 120/82 (!) 148/80 (!) 148/80  Pulse: (!) 108 (!) 105 (!) 102 (!) 102  Resp:  16 20 20   Temp:   98.4 F (36.9 C) 98.4 F (36.9 C)  TempSrc:   Oral Oral  SpO2: 99% 96% 96%   Weight:    44.6 kg  Height:    4\' 10"  (1.473 m)    Constitutional: NAD, calm, comfortable Vitals:   07/15/19 2300 07/15/19 2330 07/16/19 0035 07/16/19 0102  BP: (!) 147/80 120/82 (!) 148/80 (!) 148/80  Pulse: (!) 108 (!) 105 (!) 102 (!) 102  Resp:  16 20 20   Temp:   98.4 F (36.9 C) 98.4 F (36.9 C)  TempSrc:   Oral Oral  SpO2: 99% 96% 96%   Weight:    44.6 kg  Height:    4\' 10"  (1.473 m)   Eyes: PERRL, lids and conjunctivae normal ENMT: Mucous membranes are moist. Posterior pharynx clear of any exudate or lesions.Normal dentition.  Neck: normal, supple, no masses, no thyromegaly Respiratory: clear to auscultation bilaterally, no wheezing, no crackles. Normal  respiratory effort. No accessory muscle use.  Cardiovascular: Regular rate and rhythm, no murmurs / rubs / gallops. No extremity edema. 2+ pedal pulses. No carotid bruits.  Abdomen: no tenderness, no masses palpated. No hepatosplenomegaly. Bowel sounds positive.  Musculoskeletal: no clubbing / cyanosis. No joint deformity upper and lower extremities. Good ROM, no contractures. Normal muscle tone.  Skin: no rashes, lesions, ulcers. No induration Neurologic: Patient is able to respond to verbal commands but still very drowsy and confused. Neurological examination could not be performed accurately because of patient's current condition Psychiatric: Could not be done  because of patient's current condition.     Labs on Admission: I have personally reviewed following labs and imaging studies  CBC: Recent Labs  Lab 07/09/19 1721 07/15/19 1542 07/16/19 0503  WBC 5.7 8.4 6.6  NEUTROABS  --  5.2  --   HGB 11.4* 12.3 11.3*  HCT 36.0 37.4 34.8*  MCV 106.5* 103.0* 103.6*  PLT 196 196 [id number]   Basic Metabolic Panel: Recent Labs  Lab 07/09/19 0735 07/09/19 1721 07/15/19 1542  NA  --  138 135  K 4.3 4.4 3.5  CL  --  102 98  CO2  --  25 26  GLUCOSE  --  110* 121*  BUN  --  5* 6*  CREATININE  --  0.54 0.54  CALCIUM  --  9.5 9.4  MG 1.6*  --   --    GFR: Estimated Creatinine Clearance: 35.6 mL/min (by C-G formula based on SCr of 0.54 mg/dL). Liver Function Tests: No results for input(s): AST, ALT, ALKPHOS, BILITOT, PROT, ALBUMIN in the last 168 hours. No results for input(s): LIPASE, AMYLASE in the last 168 hours. No results for input(s): AMMONIA in the last 168 hours. Coagulation Profile: No results for input(s): INR, PROTIME in the last 168 hours. Cardiac Enzymes: No results for input(s): CKTOTAL, CKMB, CKMBINDEX, TROPONINI in the last 168 hours. BNP (last 3 results) No results for input(s): PROBNP in the last 8760 hours. HbA1C: No results for input(s): HGBA1C in the last 72  hours. CBG: No results for input(s): GLUCAP in the last 168 hours. Lipid Profile: No results for input(s): CHOL, HDL, LDLCALC, TRIG, CHOLHDL, LDLDIRECT in the last 72 hours. Thyroid Function Tests: No results for input(s): TSH, T4TOTAL, FREET4, T3FREE, THYROIDAB in the last 72 hours. Anemia Panel: No results for input(s): VITAMINB12, FOLATE, FERRITIN, TIBC, IRON, RETICCTPCT in the last 72 hours. Urine analysis:    Component Value Date/Time   COLORURINE YELLOW 07/15/2019 [place] 07/15/2019 1542   LABSPEC 1.013 07/15/2019 1542   PHURINE 6.0 07/15/2019 1542   GLUCOSEU NEGATIVE 07/15/2019 1542   HGBUR SMALL (A) 07/15/2019 1542   BILIRUBINUR NEGATIVE 07/15/2019 1542   BILIRUBINUR n 01/23/2016 0927   KETONESUR 5 (A) 07/15/2019 1542   PROTEINUR NEGATIVE 07/15/2019 1542   UROBILINOGEN 0.2 01/23/2016 0927   UROBILINOGEN 0.2 03/30/2014 1203   NITRITE NEGATIVE 07/15/2019 1542   LEUKOCYTESUR NEGATIVE 07/15/2019 1542    Radiological Exams on Admission: MR THORACIC SPINE WO CONTRAST  Result Date: 07/15/2019 CLINICAL DATA:  Low back pain. History of CLL and breast cancer. EXAM: MRI THORACIC AND LUMBAR SPINE WITHOUT CONTRAST TECHNIQUE: Multiplanar and multiecho pulse sequences of the thoracic and lumbar spine were obtained without intravenous contrast. COMPARISON:  CT chest 06/08/2019 FINDINGS: MRI THORACIC SPINE FINDINGS Alignment: Marked S shaped scoliosis in the thoracic and upper lumbar spine. Normal alignment Vertebrae: Moderate compression fracture inferior endplate of T8 appears chronic and unchanged from the prior CT Moderately severe chronic compression fracture T11 appears chronic and also unchanged from the prior CT. Negative for acute thoracic fracture. Cord:  Normal cord signal. No cord compression. Paraspinal and other soft tissues: No paraspinous mass. No pleural effusion. Disc levels: Multilevel degenerative change. Posterior spurring at T11-T12 without significant  spinal stenosis. No focal disc protrusion. MRI LUMBAR SPINE FINDINGS Segmentation:  Normal Alignment:  Mild retrolisthesis L2-3. Scoliosis. Vertebrae: Mild superior endplate fracture of L1 with bone marrow edema. This was not present on 06/08/2019 CT. No other lumbar fracture or mass Conus medullaris  and cauda equina: Conus extends to the L1-2 level. Conus and cauda equina appear normal. Paraspinal and other soft tissues: Negative for paraspinous mass or adenopathy. No significant soft tissue edema Disc levels: T12-L1: Mild retropulsion of L1 into the canal without significant spinal stenosis L1-2: Disc bulging and mild endplate spurring without stenosis. L2-3: Diffuse endplate spurring. Mild subarticular stenosis on the left. Mild spinal stenosis L3-4: Mild disc bulging and mild facet degeneration. Mild subarticular stenosis on the right L4-5: Moderate to advanced facet degeneration bilaterally causing moderate subarticular stenosis bilaterally. Mild spinal stenosis L5-S1: Moderate disc degeneration without stenosis IMPRESSION: 1. Mild superior endplate fracture of L1 appears acute or subacute and was not present on 06/08/19 CT. 2. Chronic compression fractures T8 and T11. 3. Marked S shaped scoliosis. Multilevel degenerative change in the lumbar spine as above. Electronically Signed   By: Franchot Gallo M.D.   On: 07/15/2019 19:36   MR LUMBAR SPINE WO CONTRAST  Result Date: 07/15/2019 CLINICAL DATA:  Low back pain. History of CLL and breast cancer. EXAM: MRI THORACIC AND LUMBAR SPINE WITHOUT CONTRAST TECHNIQUE: Multiplanar and multiecho pulse sequences of the thoracic and lumbar spine were obtained without intravenous contrast. COMPARISON:  CT chest 06/08/2019 FINDINGS: MRI THORACIC SPINE FINDINGS Alignment: Marked S shaped scoliosis in the thoracic and upper lumbar spine. Normal alignment Vertebrae: Moderate compression fracture inferior endplate of T8 appears chronic and unchanged from the prior CT  Moderately severe chronic compression fracture T11 appears chronic and also unchanged from the prior CT. Negative for acute thoracic fracture. Cord:  Normal cord signal. No cord compression. Paraspinal and other soft tissues: No paraspinous mass. No pleural effusion. Disc levels: Multilevel degenerative change. Posterior spurring at T11-T12 without significant spinal stenosis. No focal disc protrusion. MRI LUMBAR SPINE FINDINGS Segmentation:  Normal Alignment:  Mild retrolisthesis L2-3. Scoliosis. Vertebrae: Mild superior endplate fracture of L1 with bone marrow edema. This was not present on 06/08/2019 CT. No other lumbar fracture or mass Conus medullaris and cauda equina: Conus extends to the L1-2 level. Conus and cauda equina appear normal. Paraspinal and other soft tissues: Negative for paraspinous mass or adenopathy. No significant soft tissue edema Disc levels: T12-L1: Mild retropulsion of L1 into the canal without significant spinal stenosis L1-2: Disc bulging and mild endplate spurring without stenosis. L2-3: Diffuse endplate spurring. Mild subarticular stenosis on the left. Mild spinal stenosis L3-4: Mild disc bulging and mild facet degeneration. Mild subarticular stenosis on the right L4-5: Moderate to advanced facet degeneration bilaterally causing moderate subarticular stenosis bilaterally. Mild spinal stenosis L5-S1: Moderate disc degeneration without stenosis IMPRESSION: 1. Mild superior endplate fracture of L1 appears acute or subacute and was not present on 06/08/19 CT. 2. Chronic compression fractures T8 and T11. 3. Marked S shaped scoliosis. Multilevel degenerative change in the lumbar spine as above. Electronically Signed   By: Franchot Gallo M.D.   On: 07/15/2019 19:36    EKG: Independently reviewed.   Assessment/Plan Active Problems:   Acute metabolic encephalopathy Patient's condition improved after Narcan. Patient is able to open her eyes on verbal commands and also started following  simple commands. Continue to monitor  Severe back pain: MRI of lumbar region showed acute endplate fracture of L1 while thoracic MRI showed chronic compression fractures of T8 and T11. Patient is not in acute pain at this time. Continue to monitor.    DVT prophylaxis: Lovenox Code Status: Full code Family Communication: Patient's husband was present in the ER and aware of situation Disposition Plan: Patient  will be most probably discharge home tomorrow Consults called: None Admission status: Observation   Edmonia Lynch MD [facility]   If 7PM-7AM, please contact night-coverage [email or web address]   07/16/2019, 5:57 AM

## 2019-07-17 ENCOUNTER — Other Ambulatory Visit: Payer: Self-pay | Admitting: Hematology and Oncology

## 2019-07-17 DIAGNOSIS — M549 Dorsalgia, unspecified: Secondary | ICD-10-CM

## 2019-07-17 MED ORDER — LIP MEDEX EX OINT
TOPICAL_OINTMENT | CUTANEOUS | Status: DC | PRN
  Filled 2019-07-17: qty 7

## 2019-07-17 NOTE — Progress Notes (Signed)
PROGRESS NOTE    April [name]  [id number] DOB: 02/23/1935 DOA: 07/15/2019 PCP: Isaac Bliss, Rayford Halsted, MD    Brief Narrative:  84 y.o. female with medical history significant of chronic back pain who initially presented to the hospital for getting her scheduled MRI for her back pain but was unable to get the MRI because of severe pain and could not lay down due to pain. Patient was given two doses of lorazepam and two doses of Dilaudid in the ER and MRI was done. As soon as patient came from MRI she was very drowsy and poorly responsive. Oxygen saturation also dropped to 80s in the meantime and patient was given Narcan that resulted in very quick response and patient became more alert but remained confused. Patient was admitted for observation overnight for acute metabolic encephalopathy.     During my evaluation, patient finds her eyes to verbal commands and also able to follow small commands like squeezing the fingers and moving her feet but still very drowsy and quickly go back to sleep. ED Course: Patient was given two doses of IV lorazepam and Dilaudid in the ED before getting her MRI and was finally given Narcan after she became too drowsy and confused  Assessment & Plan:   Active Problems:   Intractable back pain   Acute metabolic encephalopathy  Active Problems:   Acute toxic metabolic encephalopathy -Noted to be acute after receiving ativan and dilaudid -Patient's condition improved after Narcan.  -Mentation seems much improved today -Suspect encephalopathy secondary to overmedication  Severe back pain: -MRI of lumbar region showed acute endplate fracture of L1 while thoracic MRI showed chronic compression fractures of T8 and T11. -Chart reviewed and case discussed with patient's Oncologist. Patient has very difficult to control back pain and was referred to Palliative Care as outpatient -On discussion with Dr. Alvy Bimler, pt reportedly failed multiple narcotic regimen  including methadone. -Narcotics remain on hold per above -IR following pt to determine appropriateness of kyphyplasty -For now, will continue tylenol and PRN toradol -Appreciate PT/OT input. Recommendation for home health PT/OT when discharged  CLL -Discussed with pt's primary Oncologist, noted to be in remission -Follow up with Oncology  DVT prophylaxis: Lovenox subQ Code Status: Full Family Communication: Pt in room, family not at bedside this AM Disposition Plan: Uncertain at this time  Consultants:   IR  Oncology  Procedures:     Antimicrobials: Anti-infectives (From admission, onward)   None      Subjective: Primarily asking about pain medications for back  Objective: Vitals:   07/16/19 2004 07/16/19 2200 07/17/19 0503 07/17/19 1330  BP: 133/77  (!) 146/74 133/73  Pulse: (!) 110 98 88 95  Resp:   18 16  Temp: 98.4 F (36.9 C)  97.6 F (36.4 C) 98.1 F (36.7 C)  TempSrc: Oral  Oral Oral  SpO2: 96%  95% 97%  Weight:      Height:        Intake/Output Summary (Last 24 hours) at 07/17/2019 1655 Last data filed at 07/17/2019 1056 Gross per 24 hour  Intake 1850 ml  Output 300 ml  Net 1550 ml   Filed Weights   07/16/19 0102  Weight: 44.6 kg    Examination: General exam: Conversant, in no acute distress, eating lunch Respiratory system: normal chest rise, clear, no audible wheezing Cardiovascular system: regular rhythm, s1-s2 Gastrointestinal system: Nondistended, nontender, pos BS Central nervous system: No seizures, no tremors Extremities: No cyanosis, no joint deformities Skin: No rashes,  no pallor Psychiatry: Affect normal // no auditory hallucinations   Data Reviewed: I have personally reviewed following labs and imaging studies  CBC: Recent Labs  Lab 07/15/19 1542 07/16/19 0503  WBC 8.4 6.6  NEUTROABS 5.2  --   HGB 12.3 11.3*  HCT 37.4 34.8*  MCV 103.0* 103.6*  PLT 196 [id number]   Basic Metabolic Panel: Recent Labs  Lab  07/15/19 1542  NA 135  K 3.5  CL 98  CO2 26  GLUCOSE 121*  BUN 6*  CREATININE 0.54  CALCIUM 9.4   GFR: Estimated Creatinine Clearance: 35.6 mL/min (by C-G formula based on SCr of 0.54 mg/dL). Liver Function Tests: No results for input(s): AST, ALT, ALKPHOS, BILITOT, PROT, ALBUMIN in the last 168 hours. No results for input(s): LIPASE, AMYLASE in the last 168 hours. No results for input(s): AMMONIA in the last 168 hours. Coagulation Profile: No results for input(s): INR, PROTIME in the last 168 hours. Cardiac Enzymes: No results for input(s): CKTOTAL, CKMB, CKMBINDEX, TROPONINI in the last 168 hours. BNP (last 3 results) No results for input(s): PROBNP in the last 8760 hours. HbA1C: No results for input(s): HGBA1C in the last 72 hours. CBG: No results for input(s): GLUCAP in the last 168 hours. Lipid Profile: No results for input(s): CHOL, HDL, LDLCALC, TRIG, CHOLHDL, LDLDIRECT in the last 72 hours. Thyroid Function Tests: No results for input(s): TSH, T4TOTAL, FREET4, T3FREE, THYROIDAB in the last 72 hours. Anemia Panel: No results for input(s): VITAMINB12, FOLATE, FERRITIN, TIBC, IRON, RETICCTPCT in the last 72 hours. Sepsis Labs: No results for input(s): PROCALCITON, LATICACIDVEN in the last 168 hours.  Recent Results (from the past 240 hour(s))  SARS CORONAVIRUS 2 (TAT 6-24 HRS) Nasopharyngeal Nasopharyngeal Swab     Status: None   Collection Time: 07/15/19 11:41 PM   Specimen: Nasopharyngeal Swab  Result Value Ref Range Status   SARS Coronavirus 2 NEGATIVE NEGATIVE Final    Comment: (NOTE) SARS-CoV-2 target nucleic acids are NOT DETECTED. The SARS-CoV-2 RNA is generally detectable in upper and lower respiratory specimens during the acute phase of infection. Negative results do not preclude SARS-CoV-2 infection, do not rule out co-infections with other pathogens, and should not be used as the sole basis for treatment or other patient management decisions. Negative  results must be combined with clinical observations, patient history, and epidemiological information. The expected result is Negative. Fact Sheet for Patients: SugarRoll.be Fact Sheet for Healthcare Providers: https://www.woods-mathews.com/ This test is not yet approved or cleared by the [place] FDA and  has been authorized for detection and/or diagnosis of SARS-CoV-2 by FDA under an Emergency Use Authorization (EUA). This EUA will remain  in effect (meaning this test can be used) for the duration of the COVID-19 declaration under Section 56 4(b)(1) of the Act, 21 U.S.C. section 360bbb-3(b)(1), unless the authorization is terminated or revoked sooner. Performed at [facility], [place] [address] [place], [place] [address]      Radiology Studies: MR THORACIC SPINE WO CONTRAST  Result Date: 07/15/2019 CLINICAL DATA:  Low back pain. History of CLL and breast cancer. EXAM: MRI THORACIC AND LUMBAR SPINE WITHOUT CONTRAST TECHNIQUE: Multiplanar and multiecho pulse sequences of the thoracic and lumbar spine were obtained without intravenous contrast. COMPARISON:  CT chest 06/08/2019 FINDINGS: MRI THORACIC SPINE FINDINGS Alignment: Marked S shaped scoliosis in the thoracic and upper lumbar spine. Normal alignment Vertebrae: Moderate compression fracture inferior endplate of T8 appears chronic and unchanged from the prior CT Moderately severe chronic compression  fracture T11 appears chronic and also unchanged from the prior CT. Negative for acute thoracic fracture. Cord:  Normal cord signal. No cord compression. Paraspinal and other soft tissues: No paraspinous mass. No pleural effusion. Disc levels: Multilevel degenerative change. Posterior spurring at T11-T12 without significant spinal stenosis. No focal disc protrusion. MRI LUMBAR SPINE FINDINGS Segmentation:  Normal Alignment:  Mild retrolisthesis L2-3. Scoliosis. Vertebrae: Mild superior endplate  fracture of L1 with bone marrow edema. This was not present on 06/08/2019 CT. No other lumbar fracture or mass Conus medullaris and cauda equina: Conus extends to the L1-2 level. Conus and cauda equina appear normal. Paraspinal and other soft tissues: Negative for paraspinous mass or adenopathy. No significant soft tissue edema Disc levels: T12-L1: Mild retropulsion of L1 into the canal without significant spinal stenosis L1-2: Disc bulging and mild endplate spurring without stenosis. L2-3: Diffuse endplate spurring. Mild subarticular stenosis on the left. Mild spinal stenosis L3-4: Mild disc bulging and mild facet degeneration. Mild subarticular stenosis on the right L4-5: Moderate to advanced facet degeneration bilaterally causing moderate subarticular stenosis bilaterally. Mild spinal stenosis L5-S1: Moderate disc degeneration without stenosis IMPRESSION: 1. Mild superior endplate fracture of L1 appears acute or subacute and was not present on 06/08/19 CT. 2. Chronic compression fractures T8 and T11. 3. Marked S shaped scoliosis. Multilevel degenerative change in the lumbar spine as above. Electronically Signed   By: Franchot Gallo M.D.   On: 07/15/2019 19:36   MR LUMBAR SPINE WO CONTRAST  Result Date: 07/15/2019 CLINICAL DATA:  Low back pain. History of CLL and breast cancer. EXAM: MRI THORACIC AND LUMBAR SPINE WITHOUT CONTRAST TECHNIQUE: Multiplanar and multiecho pulse sequences of the thoracic and lumbar spine were obtained without intravenous contrast. COMPARISON:  CT chest 06/08/2019 FINDINGS: MRI THORACIC SPINE FINDINGS Alignment: Marked S shaped scoliosis in the thoracic and upper lumbar spine. Normal alignment Vertebrae: Moderate compression fracture inferior endplate of T8 appears chronic and unchanged from the prior CT Moderately severe chronic compression fracture T11 appears chronic and also unchanged from the prior CT. Negative for acute thoracic fracture. Cord:  Normal cord signal. No cord  compression. Paraspinal and other soft tissues: No paraspinous mass. No pleural effusion. Disc levels: Multilevel degenerative change. Posterior spurring at T11-T12 without significant spinal stenosis. No focal disc protrusion. MRI LUMBAR SPINE FINDINGS Segmentation:  Normal Alignment:  Mild retrolisthesis L2-3. Scoliosis. Vertebrae: Mild superior endplate fracture of L1 with bone marrow edema. This was not present on 06/08/2019 CT. No other lumbar fracture or mass Conus medullaris and cauda equina: Conus extends to the L1-2 level. Conus and cauda equina appear normal. Paraspinal and other soft tissues: Negative for paraspinous mass or adenopathy. No significant soft tissue edema Disc levels: T12-L1: Mild retropulsion of L1 into the canal without significant spinal stenosis L1-2: Disc bulging and mild endplate spurring without stenosis. L2-3: Diffuse endplate spurring. Mild subarticular stenosis on the left. Mild spinal stenosis L3-4: Mild disc bulging and mild facet degeneration. Mild subarticular stenosis on the right L4-5: Moderate to advanced facet degeneration bilaterally causing moderate subarticular stenosis bilaterally. Mild spinal stenosis L5-S1: Moderate disc degeneration without stenosis IMPRESSION: 1. Mild superior endplate fracture of L1 appears acute or subacute and was not present on 06/08/19 CT. 2. Chronic compression fractures T8 and T11. 3. Marked S shaped scoliosis. Multilevel degenerative change in the lumbar spine as above. Electronically Signed   By: Franchot Gallo M.D.   On: 07/15/2019 19:36    Scheduled Meds: . amLODipine  5 mg Oral Daily  .  enoxaparin (LOVENOX) injection  30 mg Subcutaneous QHS   Continuous Infusions: . sodium chloride 100 mL/hr at 07/16/19 1000     LOS: 1 day   Marylu Lund, MD [facility] Pager On Amion  If 7PM-7AM, please contact night-coverage 07/17/2019, 4:55 PM

## 2019-07-17 NOTE — Evaluation (Signed)
Occupational Therapy Evaluation Patient Details Name: April [name] MRN: [id number] DOB: 06/28/1935 Today's Date: 07/17/2019    History of Present Illness 84 yo female admitted to ED on 12/16 from outpatient MRI for severe back pain. Pt with CLL currently receiving chemotherapy, and reports fall 1 week ago. Imaging reveals acute end place fracture at L4, IR consulted. PMH includes breast cancer, leukemia, osteoporosis with compression fractures at T8 and T11, scoliosis.   Clinical Impression   This 84 yo female admitted with above presents to acute OT with decreased balance and pain but able to get around at a min A level when up on her feet and able to doff and don her socks while seated EOB. Pt will benefit from acute OT with follow up [place] and 24 hour S/prn A anytime she is up on her feet.     Follow Up Recommendations  Home health OT;Supervision/Assistance - 24 hour    Equipment Recommendations  None recommended by OT       Precautions / Restrictions Precautions Precautions: Fall Precaution Comments: T8, T11 compression fractures, L4 endplate fracture Restrictions Weight Bearing Restrictions: No      Mobility Bed Mobility Overal bed mobility: Needs Assistance Bed Mobility: Supine to Sit;Sit to Supine     Supine to sit: Min assist;HOB elevated Sit to supine: Min assist;HOB elevated   General bed mobility comments: Min assist for trunk elevation/lowering, lifting LEs back into bed. Increased time and effort with use of bedrails to perform.  Transfers Overall transfer level: Needs assistance Equipment used: None Transfers: Sit to/from Stand Sit to Stand: Min assist;+2 safety/equipment         General transfer comment: Min assist for power up, steadying. Increased time to rise and reaching for HHA post-stand.    Balance Overall balance assessment: Needs assistance;History of Falls(pt reports fall at home when going to get telephone, fall after receiving cancer  treatment in the past year) Sitting-balance support: No upper extremity supported;Feet supported Sitting balance-Leahy Scale: Good     Standing balance support: No upper extremity supported Standing balance-Leahy Scale: Fair                             ADL either performed or assessed with clinical judgement   ADL Overall ADL's : Needs assistance/impaired Eating/Feeding: Independent;Sitting   Grooming: Set up;Supervision/safety Grooming Details (indicate cue type and reason): Min A for standing Upper Body Bathing: Supervision/ safety;Set up;Sitting   Lower Body Bathing: Minimal assistance;Sit to/from stand   Upper Body Dressing : Supervision/safety;Set up;Sitting   Lower Body Dressing: Minimal assistance;Sit to/from stand   Toilet Transfer: Minimal assistance;Ambulation;Regular Toilet;Grab bars;RW   Toileting- Water quality scientist and Hygiene: Min guard;Sit to/from stand               Vision Patient Visual Report: No change from baseline              Pertinent Vitals/Pain Pain Assessment: 0-10 Pain Score: 5  Pain Location: back Pain Descriptors / Indicators: Sore;Discomfort Pain Intervention(s): Limited activity within patient's tolerance;Monitored during session;Repositioned;Patient requesting pain meds-RN notified     Hand Dominance Right   Extremity/Trunk Assessment Upper Extremity Assessment Upper Extremity Assessment: Generalized weakness   Lower Extremity Assessment Lower Extremity Assessment: Generalized weakness   Cervical / Trunk Assessment Cervical / Trunk Assessment: Kyphotic;Other exceptions Cervical / Trunk Exceptions: severe scoliosis   Communication Communication Communication: No difficulties   Cognition Arousal/Alertness: Awake/alert Behavior During Therapy: WFL for  tasks assessed/performed Overall Cognitive Status: Within Functional Limits for tasks assessed                                                 Home Living Family/patient expects to be discharged to:: Private residence Living Arrangements: Spouse/significant other Available Help at Discharge: Family;Available 24 hours/day Type of Home: House Home Access: Stairs to enter [employer] of Steps: 2   Home Layout: Bed/bath upstairs;Full bath on main level;Able to live on main level with bedroom/bathroom;Two level;Laundry or work area in [employer] Shower/Tub: Tub/shower unit;Walk-in shower   Bathroom Toilet: Handicapped height     Home Equipment: Shower seat - built in;Grab bars - tub/shower;Walker - 4 wheels;Cane - single point          Prior Functioning/Environment Level of Independence: Independent        Comments: Pt reports she was not using AD PTA for ambulation, and was independent in dressing/bathing. Pt's husband states they have someone "deep clean" bi-monthly.        OT Problem List: Decreased strength;Decreased range of motion;Impaired balance (sitting and/or standing);Pain      OT Treatment/Interventions: Self-care/ADL training;DME and/or AE instruction;Patient/family education;Balance training    OT Goals(Current goals can be found in the care plan section) Acute Rehab OT Goals Patient Stated Goal: improve back pain OT Goal Formulation: With patient/family Time For Goal Achievement: 07/31/19 Potential to Achieve Goals: Good  OT Frequency: Min 2X/week           Co-evaluation PT/OT/SLP Co-Evaluation/Treatment: Yes Reason for Co-Treatment: For patient/therapist safety;To address functional/ADL transfers PT goals addressed during session: Mobility/safety with mobility OT goals addressed during session: ADL's and self-care      AM-PAC OT "6 Clicks" Daily Activity     Outcome Measure Help from another person eating meals?: None Help from another person taking care of personal grooming?: A Little Help from another person toileting, which includes using toliet, bedpan, or  urinal?: A Little Help from another person bathing (including washing, rinsing, drying)?: A Little Help from another person to put on and taking off regular upper body clothing?: A Little Help from another person to put on and taking off regular lower body clothing?: A Little 6 Click Score: 19   End of Session Equipment Utilized During Treatment: Gait belt;Rolling walker Nurse Communication: Mobility status;Patient requests pain meds  Activity Tolerance: Patient tolerated treatment well Patient left: in bed;with call bell/phone within reach;with family/visitor present  OT Visit Diagnosis: Unsteadiness on feet (R26.81);Other abnormalities of gait and mobility (R26.89);History of falling (Z91.81);Pain Pain - part of body: (back)                Time: 1240-1304 OT Time Calculation (min): 24 min Charges:  OT General Charges $OT Visit: 1 Visit OT Evaluation $OT Eval Moderate Complexity: 1 Mod  [place], OTR/L Acute [employer] Pager [phone] Office [phone]     Almon Register 07/17/2019, 4:26 PM

## 2019-07-17 NOTE — Progress Notes (Signed)
S: Patient re-evaluated today for possible L1 KP.  Patient seems to be more alert today, answers questions appropriately although she is quite poor at describing her pain. She tells me her pain is 3-4/10 with medication, 10/10 without any medications. She cannot tell me a specific area of her back that is painful, stating "mostly from the middle to the bottom." She is unsure if ambulation makes the pain worse but per PT note moderate pain noted with ambulation. Only aggravating factor she can think of is twisting motion, denies worse pain when sitting up in bed/chair. She reports intermittent numbness of her legs but cannot tell me if it is her upper/lower legs.   O: Gen: A&O x3, appears ill but not in acute distress. Poor historian regarding her pain. CV: RRR Pulm: CTAB Abd: soft, non tender Musculoskeletal: Able to sit up in bed without assistance or reported pain, can twist side to side without illiciting pain. Palpation of entire spine shows no point tenderness at L1 or elsewhere. Palpation of paraspinal musculature does not reproduce pain.  Neuro: CN grossly intact.  A/P:  Given patient without point tenderness both yesterday and today at level of fracture (or elsewhere) it's likely the pain is multifactorial in nature and this patient would NOT benefit from kyphoplasty. Would recommend continued medical management, PT/OT at this time. Discussed findings today with Dr. Anselm Pancoast (IR) who agrees that this patient is not a candidate for kyphoplasty.   Order for KP will be cancelled - please replace order if it is felt this patient would benefit from a repeat examination.  Above was discussed with patient who states understanding, all questions answered - she is requesting information at discharge regarding back pain and compression fractures.  Candiss Norse, PA-C

## 2019-07-17 NOTE — Evaluation (Signed)
Physical Therapy Evaluation Patient Details Name: April [name] MRN: [id number] DOB: 12/14/1934 Today's Date: 07/17/2019   History of Present Illness  84 yo female admitted to ED on 12/16 from outpatient MRI for severe back pain. Pt with CLL currently receiving chemotherapy, and reports fall 1 week ago. Imaging reveals acute end place fracture at L4, IR consulted. PMH includes breast cancer, leukemia, osteoporosis with compression fractures at T8 and T11, scoliosis.  Clinical Impression   Pt presents with moderate back pain with mobility, generalized weakness, difficulty performing mobility tasks, mild unsteadiness in standing with history of falls, and decreased activity tolerance due to back pain and weakness. Pt to benefit from acute PT to address deficits. Pt ambulated to bathroom and back this session with and without use of RW, no LOB noted but pt reaching to self-steady without AD use. Pt's husband at bedside during session, both pt and pt's husband state pt was independent PTA, but evidently pt's back pain has been negatively impacting mobility for some time. PT recommending HHPT to progress pt mobility post-d/c from acute setting. PT to progress mobility as tolerated, and will continue to follow acutely.      Follow Up Recommendations Home health PT;Supervision/Assistance - 24 hour    Equipment Recommendations  None recommended by PT    Recommendations for Other Services       Precautions / Restrictions Precautions Precautions: Fall Precaution Comments: T8, T11 compression fractures, L4 endplate fracture Restrictions Weight Bearing Restrictions: No      Mobility  Bed Mobility Overal bed mobility: Needs Assistance Bed Mobility: Supine to Sit;Sit to Supine     Supine to sit: Min assist;HOB elevated Sit to supine: Min assist;HOB elevated   General bed mobility comments: Min assist for trunk elevation/lowering, lifting LEs back into bed. Increased time and effort with  use of bedrails to perform.  Transfers Overall transfer level: Needs assistance Equipment used: None Transfers: Sit to/from Stand Sit to Stand: Min assist;+2 safety/equipment         General transfer comment: Min assist for power up, steadying. Increased time to rise and reaching for HHA post-stand.  Ambulation/Gait Ambulation/Gait assistance: Min assist;+2 safety/equipment;Min guard Gait Distance (Feet): 20 Feet Assistive device: Rolling walker (2 wheeled);None Gait Pattern/deviations: Step-through pattern;Decreased stride length;Trunk flexed;Narrow base of support Gait velocity: decr   General Gait Details: Min guard for safety, with occasional min assist for steadying and directing pt/RW. Used RW to ambulate to bathroom, and used no AD upon return to bed. Without AD, pt reaching for door frames to self-steady.  Stairs            Wheelchair Mobility    Modified Rankin (Stroke Patients Only)       Balance Overall balance assessment: Needs assistance;History of Falls(pt reports fall at home when going to get telephone, fall after receiving cancer treatment in the past year) Sitting-balance support: No upper extremity supported;Feet supported Sitting balance-Leahy Scale: Good     Standing balance support: No upper extremity supported Standing balance-Leahy Scale: Fair                               Pertinent Vitals/Pain Pain Assessment: 0-10 Pain Score: 5  Pain Location: back Pain Descriptors / Indicators: Sore;Discomfort Pain Intervention(s): Limited activity within patient's tolerance;Monitored during session;Repositioned;Patient requesting pain meds-RN notified    Home Living Family/patient expects to be discharged to:: Private residence Living Arrangements: Spouse/significant other Available Help at Discharge: Family;Available  24 hours/day Type of Home: House Home Access: Stairs to enter   [employer] of Steps: 2 Home Layout:  Bed/bath upstairs;Full bath on main level;Able to live on main level with bedroom/bathroom;Two level;Laundry or work area in [place]: Civil engineer, contracting - built in;Grab bars - tub/shower;Walker - 4 wheels;Cane - single point      Prior Function Level of Independence: Independent         Comments: Pt reports she was not using AD PTA for ambulation, and was independent in dressing/bathing. Pt's husband states they have someone "deep clean" bi-monthly.     Hand Dominance   Dominant Hand: Right    Extremity/Trunk Assessment   Upper Extremity Assessment Upper Extremity Assessment: Defer to OT evaluation    Lower Extremity Assessment Lower Extremity Assessment: Generalized weakness    Cervical / Trunk Assessment Cervical / Trunk Assessment: Kyphotic;Other exceptions Cervical / Trunk Exceptions: severe scoliosis  Communication   Communication: No difficulties  Cognition Arousal/Alertness: Awake/alert Behavior During Therapy: WFL for tasks assessed/performed Overall Cognitive Status: Within Functional Limits for tasks assessed                                        General Comments      Exercises     Assessment/Plan    PT Assessment Patient needs continued PT services  PT Problem List Decreased strength;Decreased mobility;Decreased safety awareness;Decreased activity tolerance;Decreased balance;Decreased knowledge of use of DME;Pain       PT Treatment Interventions DME instruction;Therapeutic activities;Gait training;Patient/family education;Therapeutic exercise;Balance training;Stair training;Functional mobility training    PT Goals (Current goals can be found in the Care Plan section)  Acute Rehab PT Goals Patient Stated Goal: improve back pain PT Goal Formulation: With patient Time For Goal Achievement: 07/31/19 Potential to Achieve Goals: Good    Frequency Min 3X/week   Barriers to discharge        Co-evaluation PT/OT/SLP  Co-Evaluation/Treatment: Yes Reason for Co-Treatment: For patient/therapist safety;To address functional/ADL transfers PT goals addressed during session: Mobility/safety with mobility         AM-PAC PT "6 Clicks" Mobility  Outcome Measure Help needed turning from your back to your side while in a flat bed without using bedrails?: A Little Help needed moving from lying on your back to sitting on the side of a flat bed without using bedrails?: A Lot Help needed moving to and from a bed to a chair (including a wheelchair)?: A Little Help needed standing up from a chair using your arms (e.g., wheelchair or bedside chair)?: A Little Help needed to walk in hospital room?: A Little Help needed climbing 3-5 steps with a railing? : A Lot 6 Click Score: 16    End of Session Equipment Utilized During Treatment: Gait belt Activity Tolerance: Patient tolerated treatment well;Patient limited by pain Patient left: in bed;with call bell/phone within reach;with family/visitor present;with bed alarm set Nurse Communication: Mobility status PT Visit Diagnosis: Other abnormalities of gait and mobility (R26.89);Difficulty in walking, not elsewhere classified (R26.2);Pain Pain - part of body: (back)    Time: 1240-1303 PT Time Calculation (min) (ACUTE ONLY): 23 min   Charges:   PT Evaluation $PT Eval Low Complexity: 1 Low         Blayden Conwell E, PT Acute Rehabilitation Services Pager [phone]  Office [phone]   Shiah Berhow D Sibley Rolison 07/17/2019, 1:57 PM

## 2019-07-18 MED ORDER — KETOROLAC TROMETHAMINE 10 MG PO TABS: 10.0000 mg | ORAL_TABLET | Freq: Four times a day (QID) | ORAL | 0 refills | Status: DC | PRN

## 2019-07-18 NOTE — TOC Progression Note (Signed)
Transition of Care [facility]) - Progression Note    Patient Details  Name: April [name] MRN: [id number] Date of Birth: 07/07/1935  [facility] CM/SW Contact  Joaquin Courts, RN Phone Number: 07/18/2019, 2:17 PM  Clinical Narrative:    CM spoke with patient. Patient set up with [facility] care for HHPT and OT.    Expected Discharge Plan: [place] Barriers to Discharge: No Barriers Identified  Expected Discharge Plan and Services Expected Discharge Plan: [place]   Discharge Planning Services: CM Consult Post Acute Care Choice: [place] arrangements for the past 2 months: Single Family Home Expected Discharge Date: 07/18/19               DME Arranged: N/A DME Agency: NA       HH Arranged: PT, OT HH Agency: [facility] Date [place]: 07/18/19 Time [place]: [id number] Representative spoke with at [place]: [facility] ([place]) Interventions    Readmission Risk Interventions Readmission Risk Prevention Plan 06/09/2019  Transportation Screening Complete  PCP or Specialist Appt within 5-7 Days Complete  Home Care Screening Complete  Medication Review (RN CM) Complete  Some recent data might be hidden

## 2019-07-18 NOTE — Discharge Summary (Signed)
Physician Discharge Summary  April [name] [id number] DOB: 1934-10-23 DOA: 07/15/2019  PCP: Isaac Bliss, Rayford Halsted, MD  Admit date: 07/15/2019 Discharge date: 07/18/2019  Admitted From: Home Disposition:  Home  Recommendations for Outpatient Follow-up:  1. Follow up with PCP in 1-2 weeks 2. Follow up with Oncology as scheduled  Home Health:PT, OT   Discharge Condition:Stable CODE STATUS:Full Diet recommendation: Regular   Brief/Interim Summary: 84 y.o.femalewith medical history significant ofchronic back pain who initially presented to the hospital for getting her scheduled MRI for her back pain but was unable to get the MRI because of severe pain and could not lay down due to pain. Patient was given two doses of lorazepam and two doses of Dilaudid in the ER and MRI was done. As soon as patient came from MRI she was very drowsy and poorly responsive. Oxygen saturation also dropped to 80s in the meantime and patient was given Narcan that resulted in very quick response and patient became more alert but remained confused. Patient was admitted for observation overnight for acute metabolic encephalopathy.  During my evaluation, patient finds her eyes to verbal commands and also able to follow small commands like squeezing the fingers and moving her feetbut still very drowsy and quickly go back to sleep. ED Course:Patient was given two doses of IV lorazepam and Dilaudid in the ED before getting her MRI and was finally given Narcan after she became too drowsy and confused  Discharge Diagnoses:  Active Problems:   Intractable back pain   Acute metabolic encephalopathy  Active Problems: Acute toxic metabolic encephalopathy -Noted to be acute after receiving ativan and dilaudid -Patient's condition improved after Narcan.  -Mentation seems much improved -Suspect encephalopathy secondary to overmedication  Severe back pain: -MRI of lumbar region showed acute endplate  fracture of L1 while thoracic MRI showed chronic compression fractures of T8 and T11. -Chart reviewed and case discussed with patient's Oncologist. Patient has very difficult to control back pain and was referred to Palliative Care as outpatient -On discussion with Dr. Alvy Bimler, pt reportedly failed multiple narcotic regimen including methadone. -Narcotics remain on hold per above -IR was consulted and patient not considered candidate for kyphoplasty -Appreciate PT/OT input. Recommendation for home health PT/OT on d/c -Per oncology, recommendation to hold steroids on d/c -Will give trial of ketorolac on d/c  CLL -Discussed with pt's primary Oncologist, noted to be in remission -Follow up with Oncology   Discharge Instructions   Allergies as of 07/18/2019      Reactions   Codeine Nausea Only   Levofloxacin Rash      Medication List    STOP taking these medications   HYDROmorphone 4 MG tablet Commonly known as: Dilaudid   methadone 10 MG tablet Commonly known as: DOLOPHINE   predniSONE 20 MG tablet Commonly known as: DELTASONE     TAKE these medications   acetaminophen 500 MG tablet Commonly known as: TYLENOL Take 2 tablets (1,000 mg total) by mouth every 8 (eight) hours. What changed:   when to take this  reasons to take this   amLODipine 5 MG tablet Commonly known as: NORVASC Take 1 tablet (5 mg total) by mouth daily.   augmented betamethasone dipropionate 0.05 % cream Commonly known as: DIPROLENE-AF Apply 1 application topically 2 (two) times daily as needed (flare ups).   bisacodyl 10 MG suppository Commonly known as: DULCOLAX Place 1 suppository (10 mg total) rectally as needed for moderate constipation.   cyclobenzaprine 5 MG tablet Commonly known  as: FLEXERIL Take 1 tablet (5 mg total) by mouth 3 (three) times daily as needed for muscle spasms.   ketorolac 10 MG tablet Commonly known as: TORADOL Take 1 tablet (10 mg total) by mouth every 6 (six)  hours as needed.   ondansetron 4 MG tablet Commonly known as: ZOFRAN Take 4 mg by mouth every 8 (eight) hours as needed for nausea or vomiting.   polyethylene glycol 17 g packet Commonly known as: MIRALAX / GLYCOLAX Take 17 g 1-3 times a day as needed for constipation   prochlorperazine 5 MG tablet Commonly known as: COMPAZINE Take 5 mg by mouth every 6 (six) hours as needed for nausea or vomiting.   senna-docusate 8.6-50 MG tablet Commonly known as: Senokot-S Take 1 tablet 1-2 times a day as needed for constipation      Follow-up Information    Isaac Bliss, Rayford Halsted, MD. Schedule an appointment as soon as possible for a visit in 1 week(s).   Specialty: Internal Medicine Contact information: [place] [address] [phone]        Heath Lark, MD Follow up.   Specialty: Hematology and Oncology Why: as scheduled Contact information: [place] [address] [phone]          Allergies  Allergen Reactions  . Codeine Nausea Only  . Levofloxacin Rash    Consultations:  Oncology  IR  Procedures/Studies: DG Chest 1 View  Result Date: 07/08/2019 CLINICAL DATA:  Weakness and altered mental status. EXAM: CHEST  1 VIEW COMPARISON:  Radiographs and CT 06/07/2019 FINDINGS: The cardiomediastinal contours are unchanged. Aortic tortuosity and atherosclerosis. Pulmonary vasculature is normal. No consolidation, pleural effusion, or pneumothorax. No acute osseous abnormalities are seen. Scoliotic curvature in the spine. Bones diffusely under mineralized. IMPRESSION: 1. No acute abnormality. 2.  Aortic Atherosclerosis (ICD10-I70.0). Electronically Signed   By: Keith Rake M.D.   On: 07/08/2019 23:46   CT Head Wo Contrast  Result Date: 07/09/2019 CLINICAL DATA:  Fall with trauma to the head. EXAM: CT HEAD WITHOUT CONTRAST TECHNIQUE: Contiguous axial images were obtained from the base of the skull through the  vertex without intravenous contrast. COMPARISON:  07/08/2019 FINDINGS: Brain: No change. Age related atrophy. Mild chronic small-vessel change of the white matter. Chronic small-vessel change of the pons. No sign of acute infarction, mass lesion, hemorrhage, hydrocephalus or extra-axial collection. Vascular: There is atherosclerotic calcification of the major vessels at the base of the brain. Skull: Negative Sinuses/Orbits: Clear/normal Other: None IMPRESSION: No change. No acute brain finding. Atrophy and chronic small-vessel change of the white matter. No traumatic finding. Electronically Signed   By: Nelson Chimes M.D.   On: 07/09/2019 18:28   CT HEAD WO CONTRAST  Result Date: 07/08/2019 CLINICAL DATA:  Altered level of consciousness (LOC), unexplained. Weakness, dizziness, confusion. EXAM: CT HEAD WITHOUT CONTRAST TECHNIQUE: Contiguous axial images were obtained from the base of the skull through the vertex without intravenous contrast. COMPARISON:  Head CT 06/02/2019 FINDINGS: Brain: No intracranial hemorrhage, mass effect, or midline shift. Stable atrophy from prior. Mild to moderate chronic small vessel ischemia. No hydrocephalus. The basilar cisterns are patent. No evidence of territorial infarct or acute ischemia. No extra-axial or intracranial fluid collection. Vascular: No hyperdense vessel. Skull: No fracture or focal lesion. Sinuses/Orbits: Paranasal sinuses and mastoid air cells are clear. The visualized orbits are unremarkable. Bilateral lens extraction. Other: None. IMPRESSION: 1. No acute intracranial abnormality. 2. Stable atrophy and chronic small vessel ischemia. Electronically  Signed   By: Keith Rake M.D.   On: 07/08/2019 23:53   MR THORACIC SPINE WO CONTRAST  Result Date: 07/15/2019 CLINICAL DATA:  Low back pain. History of CLL and breast cancer. EXAM: MRI THORACIC AND LUMBAR SPINE WITHOUT CONTRAST TECHNIQUE: Multiplanar and multiecho pulse sequences of the thoracic and lumbar  spine were obtained without intravenous contrast. COMPARISON:  CT chest 06/08/2019 FINDINGS: MRI THORACIC SPINE FINDINGS Alignment: Marked S shaped scoliosis in the thoracic and upper lumbar spine. Normal alignment Vertebrae: Moderate compression fracture inferior endplate of T8 appears chronic and unchanged from the prior CT Moderately severe chronic compression fracture T11 appears chronic and also unchanged from the prior CT. Negative for acute thoracic fracture. Cord:  Normal cord signal. No cord compression. Paraspinal and other soft tissues: No paraspinous mass. No pleural effusion. Disc levels: Multilevel degenerative change. Posterior spurring at T11-T12 without significant spinal stenosis. No focal disc protrusion. MRI LUMBAR SPINE FINDINGS Segmentation:  Normal Alignment:  Mild retrolisthesis L2-3. Scoliosis. Vertebrae: Mild superior endplate fracture of L1 with bone marrow edema. This was not present on 06/08/2019 CT. No other lumbar fracture or mass Conus medullaris and cauda equina: Conus extends to the L1-2 level. Conus and cauda equina appear normal. Paraspinal and other soft tissues: Negative for paraspinous mass or adenopathy. No significant soft tissue edema Disc levels: T12-L1: Mild retropulsion of L1 into the canal without significant spinal stenosis L1-2: Disc bulging and mild endplate spurring without stenosis. L2-3: Diffuse endplate spurring. Mild subarticular stenosis on the left. Mild spinal stenosis L3-4: Mild disc bulging and mild facet degeneration. Mild subarticular stenosis on the right L4-5: Moderate to advanced facet degeneration bilaterally causing moderate subarticular stenosis bilaterally. Mild spinal stenosis L5-S1: Moderate disc degeneration without stenosis IMPRESSION: 1. Mild superior endplate fracture of L1 appears acute or subacute and was not present on 06/08/19 CT. 2. Chronic compression fractures T8 and T11. 3. Marked S shaped scoliosis. Multilevel degenerative change in  the lumbar spine as above. Electronically Signed   By: Franchot Gallo M.D.   On: 07/15/2019 19:36   MR LUMBAR SPINE WO CONTRAST  Result Date: 07/15/2019 CLINICAL DATA:  Low back pain. History of CLL and breast cancer. EXAM: MRI THORACIC AND LUMBAR SPINE WITHOUT CONTRAST TECHNIQUE: Multiplanar and multiecho pulse sequences of the thoracic and lumbar spine were obtained without intravenous contrast. COMPARISON:  CT chest 06/08/2019 FINDINGS: MRI THORACIC SPINE FINDINGS Alignment: Marked S shaped scoliosis in the thoracic and upper lumbar spine. Normal alignment Vertebrae: Moderate compression fracture inferior endplate of T8 appears chronic and unchanged from the prior CT Moderately severe chronic compression fracture T11 appears chronic and also unchanged from the prior CT. Negative for acute thoracic fracture. Cord:  Normal cord signal. No cord compression. Paraspinal and other soft tissues: No paraspinous mass. No pleural effusion. Disc levels: Multilevel degenerative change. Posterior spurring at T11-T12 without significant spinal stenosis. No focal disc protrusion. MRI LUMBAR SPINE FINDINGS Segmentation:  Normal Alignment:  Mild retrolisthesis L2-3. Scoliosis. Vertebrae: Mild superior endplate fracture of L1 with bone marrow edema. This was not present on 06/08/2019 CT. No other lumbar fracture or mass Conus medullaris and cauda equina: Conus extends to the L1-2 level. Conus and cauda equina appear normal. Paraspinal and other soft tissues: Negative for paraspinous mass or adenopathy. No significant soft tissue edema Disc levels: T12-L1: Mild retropulsion of L1 into the canal without significant spinal stenosis L1-2: Disc bulging and mild endplate spurring without stenosis. L2-3: Diffuse endplate spurring. Mild subarticular stenosis on the  left. Mild spinal stenosis L3-4: Mild disc bulging and mild facet degeneration. Mild subarticular stenosis on the right L4-5: Moderate to advanced facet degeneration  bilaterally causing moderate subarticular stenosis bilaterally. Mild spinal stenosis L5-S1: Moderate disc degeneration without stenosis IMPRESSION: 1. Mild superior endplate fracture of L1 appears acute or subacute and was not present on 06/08/19 CT. 2. Chronic compression fractures T8 and T11. 3. Marked S shaped scoliosis. Multilevel degenerative change in the lumbar spine as above. Electronically Signed   By: Franchot Gallo M.D.   On: 07/15/2019 19:36   IR Radiologist Eval & Mgmt  Result Date: 07/01/2019 Please refer to notes tab for details about interventional procedure. (Op Note)    Subjective: Eager to go home  Discharge Exam: Vitals:   07/18/19 0543 07/18/19 1010  BP: (!) 149/80 (!) 145/81  Pulse: 88   Resp: 16   Temp: 98.1 F (36.7 C)   SpO2: 93%    Vitals:   07/17/19 2139 07/17/19 2142 07/18/19 0543 07/18/19 1010  BP: (!) 157/86 (!) 141/78 (!) 149/80 (!) 145/81  Pulse: 100 98 88   Resp: 16  16   Temp: 98.3 F (36.8 C)  98.1 F (36.7 C)   TempSrc: Oral  Oral   SpO2: 96%  93%   Weight:      Height:        General: Pt is alert, awake, not in acute distress Cardiovascular: RRR, S1/S2 +, no rubs, no gallops Respiratory: CTA bilaterally, no wheezing, no rhonchi Abdominal: Soft, NT, ND, bowel sounds + Extremities: no edema, no cyanosis   The results of significant diagnostics from this hospitalization (including imaging, microbiology, ancillary and laboratory) are listed below for reference.     Microbiology: Recent Results (from the past 240 hour(s))  SARS CORONAVIRUS 2 (TAT 6-24 HRS) Nasopharyngeal Nasopharyngeal Swab     Status: None   Collection Time: 07/15/19 11:41 PM   Specimen: Nasopharyngeal Swab  Result Value Ref Range Status   SARS Coronavirus 2 NEGATIVE NEGATIVE Final    Comment: (NOTE) SARS-CoV-2 target nucleic acids are NOT DETECTED. The SARS-CoV-2 RNA is generally detectable in upper and lower respiratory specimens during the acute phase of  infection. Negative results do not preclude SARS-CoV-2 infection, do not rule out co-infections with other pathogens, and should not be used as the sole basis for treatment or other patient management decisions. Negative results must be combined with clinical observations, patient history, and epidemiological information. The expected result is Negative. Fact Sheet for Patients: SugarRoll.be Fact Sheet for Healthcare Providers: https://www.woods-mathews.com/ This test is not yet approved or cleared by the [place] FDA and  has been authorized for detection and/or diagnosis of SARS-CoV-2 by FDA under an Emergency Use Authorization (EUA). This EUA will remain  in effect (meaning this test can be used) for the duration of the COVID-19 declaration under Section 56 4(b)(1) of the Act, 21 U.S.C. section 360bbb-3(b)(1), unless the authorization is terminated or revoked sooner. Performed at [facility], [place] [address], [place], [place] [address]      Labs: BNP (last 3 results) No results for input(s): BNP in the last 8760 hours. Basic Metabolic Panel: Recent Labs  Lab 07/15/19 1542  NA 135  K 3.5  CL 98  CO2 26  GLUCOSE 121*  BUN 6*  CREATININE 0.54  CALCIUM 9.4   Liver Function Tests: No results for input(s): AST, ALT, ALKPHOS, BILITOT, PROT, ALBUMIN in the last 168 hours. No results for input(s): LIPASE, AMYLASE in  the last 168 hours. No results for input(s): AMMONIA in the last 168 hours. CBC: Recent Labs  Lab 07/15/19 1542 07/16/19 0503  WBC 8.4 6.6  NEUTROABS 5.2  --   HGB 12.3 11.3*  HCT 37.4 34.8*  MCV 103.0* 103.6*  PLT 196 154   Cardiac Enzymes: No results for input(s): CKTOTAL, CKMB, CKMBINDEX, TROPONINI in the last 168 hours. BNP: Invalid input(s): POCBNP CBG: No results for input(s): GLUCAP in the last 168 hours. D-Dimer No results for input(s): DDIMER in the last 72 hours. Hgb A1c No results for  input(s): HGBA1C in the last 72 hours. Lipid Profile No results for input(s): CHOL, HDL, LDLCALC, TRIG, CHOLHDL, LDLDIRECT in the last 72 hours. Thyroid function studies No results for input(s): TSH, T4TOTAL, T3FREE, THYROIDAB in the last 72 hours.  Invalid input(s): FREET3 Anemia work up No results for input(s): VITAMINB12, FOLATE, FERRITIN, TIBC, IRON, RETICCTPCT in the last 72 hours. Urinalysis    Component Value Date/Time   COLORURINE YELLOW 07/15/2019 1542   APPEARANCEUR CLEAR 07/15/2019 1542   LABSPEC 1.013 07/15/2019 1542   PHURINE 6.0 07/15/2019 1542   GLUCOSEU NEGATIVE 07/15/2019 1542   HGBUR SMALL (A) 07/15/2019 1542   BILIRUBINUR NEGATIVE 07/15/2019 1542   BILIRUBINUR n 01/23/2016 0927   KETONESUR 5 (A) 07/15/2019 1542   PROTEINUR NEGATIVE 07/15/2019 1542   UROBILINOGEN 0.2 01/23/2016 0927   UROBILINOGEN 0.2 03/30/2014 1203   NITRITE NEGATIVE 07/15/2019 1542   LEUKOCYTESUR NEGATIVE 07/15/2019 1542   Sepsis Labs Invalid input(s): PROCALCITONIN,  WBC,  LACTICIDVEN Microbiology Recent Results (from the past 240 hour(s))  SARS CORONAVIRUS 2 (TAT 6-24 HRS) Nasopharyngeal Nasopharyngeal Swab     Status: None   Collection Time: 07/15/19 11:41 PM   Specimen: Nasopharyngeal Swab  Result Value Ref Range Status   SARS Coronavirus 2 NEGATIVE NEGATIVE Final    Comment: (NOTE) SARS-CoV-2 target nucleic acids are NOT DETECTED. The SARS-CoV-2 RNA is generally detectable in upper and lower respiratory specimens during the acute phase of infection. Negative results do not preclude SARS-CoV-2 infection, do not rule out co-infections with other pathogens, and should not be used as the sole basis for treatment or other patient management decisions. Negative results must be combined with clinical observations, patient history, and epidemiological information. The expected result is Negative. Fact Sheet for Patients: SugarRoll.be Fact Sheet for  Healthcare Providers: https://www.woods-mathews.com/ This test is not yet approved or cleared by the [place] FDA and  has been authorized for detection and/or diagnosis of SARS-CoV-2 by FDA under an Emergency Use Authorization (EUA). This EUA will remain  in effect (meaning this test can be used) for the duration of the COVID-19 declaration under Section 56 4(b)(1) of the Act, 21 U.S.C. section 360bbb-3(b)(1), unless the authorization is terminated or revoked sooner. Performed at [facility], [place] [address] [place], [place] [address]    Time spent: 30 min  SIGNED:   Marylu Lund, MD  [facility] 07/18/2019, 1:39 PM  If 7PM-7AM, please contact night-coverage

## 2019-07-20 ENCOUNTER — Telehealth: Payer: Self-pay

## 2019-07-20 ENCOUNTER — Telehealth: Payer: Self-pay | Admitting: Internal Medicine

## 2019-07-20 NOTE — Telephone Encounter (Signed)
Transition Care Management Follow-up Telephone Call  Date of discharge and from where: 07/18/2019 from [facility]   How have you been since you were released from the hospital? "I'm having a lot of pain in my back and diarrhea from the new pain medicine I think".   Any questions or concerns? Yes . Patient reported she thinks the oral toradol is causing diarrhea. She reports she has been also using oxycodone 5mg  twice daily since home from the hospital because she doesn't think the toradol is helping her much.   Items Reviewed:  Did the pt receive and understand the discharge instructions provided? Yes   Medications obtained and verified? Yes   Any new allergies since your discharge? No   Dietary orders reviewed? Yes  Do you have support at home? Yes , husband helps her but they are struggling with meals because she is not able to stand very long in the kitchen. Her husband has purchased quick fix meals but she states it's still very difficult for them. She reports they do not have any family or friends in the area that help them.   Other (ie: DME, Home Health, etc) discussed and call was made to [facility] to check on status of OT/PT since patient had not heard from them since home from hospital. Order also given for MSW and home health aid to try to provide help to patient. Discussed with patient the need for a rail to help her get in/out of bed. This will be relayed to [facility] also.  Functional Questionnaire: (I = Independent and D = Dependent) ADL's: patient and husband in household and without help from family or friends. Patient is doing meal prep. She reported significant back pain and problems completing ADL's independently. She is a current patent with [facility] physical therapy. Order given for MSW and home health aid.  Bathing/Dressing-   Meal Prep-   Eating-independent  Maintaining continence-  Transferring/Ambulation-   Managing Meds-  independent   Follow up appointments reviewed:    PCP Hospital f/u appt confirmed? Yes  Scheduled to see Dr. Jerilee Hoh 08/12/19 via phone visit.  [facility] f/u appt confirmed? No  N/A  Are transportation arrangements needed? No   If their condition worsens, is the pt aware to call  their PCP or go to the ED? Yes  Was the patient provided with contact information for the PCP's office or ED? Yes  Was the pt encouraged to call back with questions or concerns? Yes

## 2019-07-20 NOTE — Telephone Encounter (Signed)
Copied from [place] [id number]. Topic: Quick Communication - See Telephone Encounter >> Jul 20, 2019 10:16 AM Loma Boston wrote: CRM for notification. See Telephone encounter for: 07/20/19. Verbal order that for PT to eval and treat. Tanya with [phone]

## 2019-07-20 NOTE — Telephone Encounter (Signed)
9:30am:  TC from patient's niece Lattie Haw, who reports patient having difficulty taking prescribed Toradol d/t GI upset after taking this med.  I advised patient to start Omeprazole 20mg  1-2 tabs/day (script called in to [facility] [phone], disp #40). Also, to premedicate with her antiemetic (either Zofran or Compazine, which ever works best for her) 30min before taking Toradol. Take Toradol after eating food.   F/U Palliative Home visit scheduled for tomorrow 07/20/2019 at 10am.   Lattie Haw mentions she plans to call Dr. Calton Dach office to ensure that home PT has been ordered, and to attempt to obtain referral for ortho pain clinic.   [name] NP-C [phone]

## 2019-07-21 ENCOUNTER — Other Ambulatory Visit: Payer: Self-pay

## 2019-07-21 ENCOUNTER — Other Ambulatory Visit: Payer: Medicare Other | Admitting: Internal Medicine

## 2019-07-21 DIAGNOSIS — Z515 Encounter for palliative care: Secondary | ICD-10-CM

## 2019-07-21 DIAGNOSIS — R52 Pain, unspecified: Secondary | ICD-10-CM

## 2019-07-21 NOTE — Progress Notes (Signed)
Dec 22nd, 2020 [facility] Palliative Care Consult Note Telephone: [phone]  Fax: [phone]  PATIENT NAME: April [name] DOB: 12/29/1934 MRN: [id number] April [name] [id number] [email or web address]   PRIMARY CARE PROVIDER:   Isaac [name], April Halsted, MD [phone] April [name] Dr. Heath [name] (Hematology and Oncology [phone]) [facility] [phone])  REFERRING PROVIDER:  Isaac [name], April Halsted, MD  [place] [address] RESPONSIBLE PARTY:   April [name] (niece/HCPOA), [phone]   ASSESSMENT / RECOMMENDATIONS:  1.Advance Care Planning:   A. Directives: DNR   B. Goals of Care: Gain strength. Optimize pain control    2. Symptom Management: A. Pain: 2/2 acute on chronic vertebral fractures (chronic compression fractures T8 and T11; acute endplate fracture of L1). Now off all opioids (side effect of excessive sedation). Recently prescribed Toradol but patient suffered side effect of abdominal pain. Regimen now simplified to:  -Tylenol 500mg  q4hrs. May take additional 2 tabs during the night prn  -Ibuprofen 200mg  tid; can take additional tab during night prn  -Flexeril 5mg  at bedtime prn muscle spasms/back pain  -PT to start today; we discussed the importance of continuing prescribed exercise regimen.  B. LE swelling; ankles: possibly d/t side effect of Norvasc. As BP in good range (she hasn't taken it this am and BP 120's/80's) will have her discontinue for now  3. Cognitive / Functional status: Patient is A & O. Able to self-transfer and ambulate independently with walker. Home health aide to start visits, pending PT evaluation. Weight 92 lbs. At a height of 5' her BMI is 18kg/m2.   4. Family / Community Supports: PT by [facility] [phone]). Married 60 yrs. No children. They are close to their niece April [name] who is also their HCPOA [facility]); she visits frequently.               5.  Follow up Palliative Care Visit: F/U TC on Monday Dec 28th. I called niece April [name] and updated her with today's visit.  I spent 60 minutes providing this consultation,  from 10:30am to 11:30am. More than 50% of the time in this consultation was spent coordinating communication.   HISTORY OF PRESENT ILLNESS:  April [name] is an 84 y.o. female with medical h/o CLL (now in remission), breast cancer, osteoporosis, vertebral fractures. This is a f/u Palliative Care Visit from Nov 16th.   CODE STATUS: DNR  PPS: 60%  HOSPICE ELIGIBILITY/DIAGNOSIS: TBD  PAST MEDICAL HISTORY:  Past Medical History:  Diagnosis Date  . Breast CA ([place])   . Bronchitis, chronic ([place])   . Eye hemorrhage, left   . 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 Osteoporosis 05/2018   T score -3.1 overall stable from prior study    SOCIAL HX:  Social History   Tobacco Use  . Smoking status: Former Smoker    Types: Cigarettes    Quit date: 03/26/1955    Years since quitting: 64.3  . Smokeless tobacco: Never Used  Substance Use Topics  . Alcohol use: Never    ALLERGIES:  Allergies  Allergen Reactions  . Codeine Nausea Only  . Levofloxacin Rash     PERTINENT MEDICATIONS:  Outpatient Encounter Medications as of 07/21/2019  Medication Sig  . acetaminophen (TYLENOL) 500 MG tablet Take 2 tablets (1,000 mg total) by mouth every 8 (eight) hours. (Patient taking differently: Take 1,000 mg by mouth every 8 (  eight) hours as needed for moderate pain. )  . cyclobenzaprine (FLEXERIL) 5 MG tablet Take 1 tablet (5 mg total) by mouth 3 (three) times daily as needed for muscle spasms.  Marland Kitchen ibuprofen (ADVIL) 200 MG tablet Take 200 mg by mouth every 6 (six) hours as needed.  Marland Kitchen amLODipine (NORVASC) 5 MG tablet Take 1 tablet (5 mg total) by mouth daily. (Patient not taking: Reported on 07/22/2019)  . augmented betamethasone dipropionate (DIPROLENE-AF) 0.05 % cream Apply 1  application topically 2 (two) times daily as needed (flare ups).   . bisacodyl (DULCOLAX) 10 MG suppository Place 1 suppository (10 mg total) rectally as needed for moderate constipation. (Patient not taking: Reported on 07/20/2019)  . ketorolac (TORADOL) 10 MG tablet Take 1 tablet (10 mg total) by mouth every 6 (six) hours as needed. (Patient not taking: Reported on 07/22/2019)  . ondansetron (ZOFRAN) 4 MG tablet Take 4 mg by mouth every 8 (eight) hours as needed for nausea or vomiting.  . polyethylene glycol (MIRALAX / GLYCOLAX) 17 g packet Take 17 g 1-3 times a day as needed for constipation (Patient not taking: Reported on 07/20/2019)  . prochlorperazine (COMPAZINE) 5 MG tablet Take 5 mg by mouth every 6 (six) hours as needed for nausea or vomiting.   . senna-docusate (SENOKOT-S) 8.6-50 MG tablet Take 1 tablet 1-2 times a day as needed for constipation (Patient not taking: Reported on 07/20/2019)   No facility-administered encounter medications on file as of 07/21/2019.    PHYSICAL EXAM:   General: NAD, frail appearing, thin Cardiovascular: regular rate and rhythm Pulmonary: clear ant fields Abdomen: soft, nontender, + bowel sounds GU: no suprapubic tenderness Extremities: no edema, no joint deformities Skin: no rashes Neurological: Weakness but otherwise nonfocal  April Handler, NP

## 2019-07-22 ENCOUNTER — Telehealth: Payer: Self-pay | Admitting: Internal Medicine

## 2019-07-22 NOTE — Telephone Encounter (Signed)
1:30pm: TC to patient to f/u pain control on Tylenol/Motrin/Flexeril regimen. Patient reports has been taking the Tylenol 500mg  q4h while awake; didn't need to take any extra doses during the night. She hadn't yet started taking the Motrin 200mg  tid, but will start that today. She is able to self-transfer and ambulate with walker independently, and seemed in good spirits when talking to me on the phone. When asked about adequacy of pain management she said she "wasn't sure; haven't been on long enough yet". She did take a Flexeril last hs, which she believed helped. [facility] did stop by yesterday for an initial evaluation.   [name] NP-C [phone]

## 2019-07-23 ENCOUNTER — Telehealth: Payer: Self-pay | Admitting: Internal Medicine

## 2019-07-23 NOTE — Telephone Encounter (Signed)
Home Health Verbal Orders - Caller/Agency: [facility]/ from [facility] Callback Number: [phone] Requesting OT/PT/Skilled Nursing/Social Work/Speech Therapy: OT Frequency: 1x for 1wk  Will states the pt has an elevated at 145 no dizziness just tired. Please advise.

## 2019-07-27 ENCOUNTER — Telehealth: Payer: Self-pay | Admitting: Internal Medicine

## 2019-07-27 NOTE — Telephone Encounter (Signed)
Pt called stating she is in a lot of pain due to her fractured vertebrae. Pt states she has been told to take motrin by a nurse, but that it causes her to have constant diarrhea and does not take the pain away. Pt is requesting to have pain medication sent in for her. Please advise.     [id number] - [place], [place]  [place] [address]  Phone: [phone] Fax: [phone]  Not a 24 hour pharmacy; exact hours not known.

## 2019-07-28 NOTE — Telephone Encounter (Signed)
Verbal orders give to Will.

## 2019-07-28 NOTE — Telephone Encounter (Signed)
Patient is aware 

## 2019-07-28 NOTE — Telephone Encounter (Signed)
Her oncologist has been prescribing pain meds. Please have her notify them today.  [place]

## 2019-07-28 NOTE — Telephone Encounter (Signed)
Ok to order 

## 2019-07-30 ENCOUNTER — Other Ambulatory Visit: Payer: Self-pay

## 2019-07-30 ENCOUNTER — Other Ambulatory Visit: Payer: Medicare Other | Admitting: Internal Medicine

## 2019-07-30 ENCOUNTER — Encounter: Payer: Self-pay | Admitting: Internal Medicine

## 2019-07-30 DIAGNOSIS — R52 Pain, unspecified: Secondary | ICD-10-CM

## 2019-07-30 DIAGNOSIS — Z515 Encounter for palliative care: Secondary | ICD-10-CM

## 2019-07-30 NOTE — Progress Notes (Signed)
Dec 31st, 2020 [facility] Palliative Care Consult Note Telephone: [phone]  Fax: [phone]   Due to the current COVID-19 infection/crises, the family prefer, and have given their verbal consent for, a provider visit via telemedicine. HIPPA policies of confidentially were discussed. Video-audio (telehealth) contact was unable to be done due technical barriers from the patient's side.  PATIENT NAME: April [name] DOB:Dec 19, 1934 [id number] [place] [phone] [email or web address]   PRIMARY CARE PROVIDER:Hernandez Everardo Beals, MD [phone] April [name] Dr. Heath [name] (Hematology and Oncology [phone]) [facility] [phone])  [place], [place], [place] [address] RESPONSIBLE PARTY:April [name] (niece/HCPOA), [phone]   ASSESSMENT / RECOMMENDATIONS: 1.Advance Care Planning:              A. Directives: DNR              B. Goals of Care: Gain strength. Optimize pain control   2. Symptom Management:  A. Pain: 2/2 acute on chronic vertebral fractures (chronic compression fractures T8 and T11; acute endplate fracture of L1).  April [name]'s pain seems to be improving over time. She developed some diarrhea and abd pain, which she attributed to the ibuprofen and Tylenol so she stopped both those medications. She describes her back pain as mild to moderate. She's able to ambulate about the home and do her activities. Sometimes difficulty getting to sleep d/t back discomfort; she can't really remember if Tylenol at hs helped or not. Her stool this am was soft rather than liquid. She's consuming almost 100% of 3 daily meals and has a good appetite. She has had no nausea. She has, however, dropped a few more pounds; currently 89lbs (from 33). At a height of 5' her BMI is 17.4kg/m2. PT was working with with her; they are due to start back up next week. April [name] mentions that when  she took the Prilosec, it helped her abdominal discomfort but she's convinced it caused the muscles of her back to "lock up" so she stopped this medication.  3. Family / Community Supports: PT by [facility] [phone]). Married 60 yrs. No children. They are close to their niece April [name] who is also their HCPOA April [name]); she visits frequently.  4. Follow up Palliative Care Visit: F/U TC on Tue Jan 5th at [place] for swelling not that off Norvasc. Check weight/adequacy of pain management.I called niece April [name] and updated her with today's visit.  I spent 30 minutes providing this consultation,  from 11am to 11:30am. More than 50% of the time in this consultation was spent coordinating communication. April [name] mentions that patient has been interviewing for an aide to assist her in the home.  HISTORY OF PRESENT ILLNESS:  April [name] is an 84 y.o. female with medical h/o CLL (now in remission), breast cancer, osteoporosis, vertebral fractures. This is a f/u Palliative Care Visit from Dec 22th.    CODE STATUS: DNR  PPS: 60%  HOSPICE ELIGIBILITY/DIAGNOSIS: TBD  PAST MEDICAL HISTORY:  Past Medical History:  Diagnosis Date  . Breast CA ([place])   . Bronchitis, chronic ([place])   . Eye hemorrhage, left   . 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 Osteoporosis 05/2018   T score -3.1 overall stable from prior study    SOCIAL HX:  Social History   Tobacco Use  . Smoking status: Former Smoker    Types: Cigarettes  Quit date: 03/26/1955    Years since quitting: 64.3  . Smokeless tobacco: Never Used  Substance Use Topics  . Alcohol use: Never    ALLERGIES:  Allergies  Allergen Reactions  . Codeine Nausea Only  . Levofloxacin Rash     PERTINENT MEDICATIONS:  Outpatient Encounter Medications as of 07/30/2019  Medication Sig  . acetaminophen (TYLENOL) 500 MG tablet Take 2 tablets (1,000 mg  total) by mouth every 8 (eight) hours. (Patient taking differently: Take 1,000 mg by mouth every 8 (eight) hours as needed for moderate pain. )  . amLODipine (NORVASC) 5 MG tablet Take 1 tablet (5 mg total) by mouth daily. (Patient not taking: Reported on 07/22/2019)  . augmented betamethasone dipropionate (DIPROLENE-AF) 0.05 % cream Apply 1 application topically 2 (two) times daily as needed (flare ups).   . bisacodyl (DULCOLAX) 10 MG suppository Place 1 suppository (10 mg total) rectally as needed for moderate constipation. (Patient not taking: Reported on 07/20/2019)  . cyclobenzaprine (FLEXERIL) 5 MG tablet Take 1 tablet (5 mg total) by mouth 3 (three) times daily as needed for muscle spasms.  Marland Kitchen ibuprofen (ADVIL) 200 MG tablet Take 200 mg by mouth every 6 (six) hours as needed.  Marland Kitchen ketorolac (TORADOL) 10 MG tablet Take 1 tablet (10 mg total) by mouth every 6 (six) hours as needed. (Patient not taking: Reported on 07/22/2019)  . ondansetron (ZOFRAN) 4 MG tablet Take 4 mg by mouth every 8 (eight) hours as needed for nausea or vomiting.  . polyethylene glycol (MIRALAX / GLYCOLAX) 17 g packet Take 17 g 1-3 times a day as needed for constipation (Patient not taking: Reported on 07/20/2019)  . prochlorperazine (COMPAZINE) 5 MG tablet Take 5 mg by mouth every 6 (six) hours as needed for nausea or vomiting.   . senna-docusate (SENOKOT-S) 8.6-50 MG tablet Take 1 tablet 1-2 times a day as needed for constipation (Patient not taking: Reported on 07/20/2019)   No facility-administered encounter medications on file as of 07/30/2019.    PHYSICAL EXAM:   PE deferred d/t telehealth/audio only nature of visit  April Handler, NP

## 2019-08-02 ENCOUNTER — Other Ambulatory Visit: Payer: Self-pay | Admitting: Oncology

## 2019-08-02 ENCOUNTER — Encounter: Payer: Self-pay | Admitting: Internal Medicine

## 2019-08-03 ENCOUNTER — Ambulatory Visit: Payer: Self-pay | Admitting: *Deleted

## 2019-08-03 ENCOUNTER — Telehealth: Payer: Medicare PPO | Admitting: Family Medicine

## 2019-08-03 NOTE — Telephone Encounter (Signed)
Patient is calling to report she has stomach pain every time she eats. This has been occurring for over a week now.  Call to office for appointment.  Reason for Disposition . [1] MODERATE pain (e.g., interferes with normal activities) AND [2] pain comes and goes (cramps) AND [3] present > 24 hours  (Exception: pain with Vomiting or Diarrhea - see that Guideline)  Answer Assessment - Initial Assessment Questions 1. LOCATION: "Where does it hurt?"      Stomach ache for several weeks- patient had diarrhea, eating is causing pain. Left side is worse- hurts all over. 2. RADIATION: "Does the pain shoot anywhere else?" (e.g., chest, back)     no 3. ONSET: "When did the pain begin?" (e.g., minutes, hours or days ago)      Several weeks 4. SUDDEN: "Gradual or sudden onset?"     Either- with eating, pain is not present without eating 5. PATTERN "Does the pain come and go, or is it constant?"    - If constant: "Is it getting better, staying the same, or worsening?"      (Note: Constant means the pain never goes away completely; most serious pain is constant and it progresses)     - If intermittent: "How long does it last?" "Do you have pain now?"     (Note: Intermittent means the pain goes away completely between bouts)     Constant if eating food 6. SEVERITY: "How bad is the pain?"  (e.g., Scale 1-10; mild, moderate, or severe)   - MILD (1-3): doesn't interfere with normal activities, abdomen soft and not tender to touch    - MODERATE (4-7): interferes with normal activities or awakens from sleep, tender to touch    - SEVERE (8-10): excruciating pain, doubled over, unable to do any normal activities      Mild/Moderate 7. RECURRENT SYMPTOM: "Have you ever had this type of abdominal pain before?" If so, ask: "When was the last time?" and "What happened that time?"      Bouts with diarrhea before- got better for a few days. 8. CAUSE: "What do you think is causing the abdominal pain?"     Eating  causes the pain. 9. RELIEVING/AGGRAVATING FACTORS: "What makes it better or worse?" (e.g., movement, antacids, bowel movement)     pepto made it worse 10. OTHER SYMPTOMS: "Has there been any vomiting, diarrhea, constipation, or urine problems?"       no 11. PREGNANCY: "Is there any chance you are pregnant?" "When was your last menstrual period?"       n/a  Protocols used: ABDOMINAL PAIN - [facility]

## 2019-08-03 NOTE — Telephone Encounter (Signed)
See note

## 2019-08-03 NOTE — Telephone Encounter (Signed)
Pt is scheduled for a virtual visit with Dr Maudie Mercury tomorrow morning at 10.00 am

## 2019-08-04 ENCOUNTER — Telehealth: Payer: Self-pay | Admitting: Internal Medicine

## 2019-08-04 ENCOUNTER — Other Ambulatory Visit: Payer: Self-pay

## 2019-08-04 ENCOUNTER — Encounter: Payer: Self-pay | Admitting: Family Medicine

## 2019-08-04 ENCOUNTER — Telehealth (INDEPENDENT_AMBULATORY_CARE_PROVIDER_SITE_OTHER): Payer: Medicare PPO | Admitting: Family Medicine

## 2019-08-04 DIAGNOSIS — K219 Gastro-esophageal reflux disease without esophagitis: Secondary | ICD-10-CM | POA: Diagnosis not present

## 2019-08-04 DIAGNOSIS — R197 Diarrhea, unspecified: Secondary | ICD-10-CM

## 2019-08-04 DIAGNOSIS — R1084 Generalized abdominal pain: Secondary | ICD-10-CM | POA: Diagnosis not present

## 2019-08-04 MED ORDER — FAMOTIDINE 10 MG PO TABS: 10.0000 mg | ORAL_TABLET | Freq: Every day | ORAL | 0 refills | Status: DC

## 2019-08-04 MED ORDER — SUCRALFATE 1 GM/10ML PO SUSP: ORAL | 0 refills | Status: DC

## 2019-08-04 NOTE — Progress Notes (Signed)
Virtual Visit via Telephone Note  I connected with April [name] on 08/04/19 at 10:00 AM EST by telephone and verified that I am speaking with the correct person using two identifiers.   I discussed the limitations, risks, security and privacy concerns of performing an evaluation and management service by telephone and the availability of in person appointments. I also discussed with the patient that there may be a patient responsible charge related to this service. The patient expressed understanding and agreed to proceed.  Location patient: home Location provider: work or home office Participants present for the call: patient, provider Patient did not have a visit in the prior 7 days to address this/these issue(s).   History of Present Illness:  Acute visit for abd pain: -recent hospitalization for pain related to compression fracture, due for PCP follow up -recently has had abd pain, worse after eating -per palliative care notes, tried prilosec for this which helped, but caused muscle pain? -today reports: the pain started several weeks ago, the pain moves around and can be in various areas - can be upper or lower abd, feels like pain occurs as soon as she puts food in her mouth, pain is worse with eating and lasts for a few hours, decreased appetite because she is worried it will cause pain, she had a lot of diarrhea initially - but no diarrhea in the last week - just loose stools. Last BM was the day before yesterday. She also has had some acid reflux.  Reports her weight is around 85-86 now.  -denies:nausea, vomiting, fevers, melena, hematochezia. -reports is drinking fluids -occ takes tylenol, but no other pain medications -PMH sig for CLL (seeing oncology for this, treated and in remission), fall resulting in compression fracture lately    Observations/Objective: Patient sounds cheerful and well on the phone. I do not appreciate any SOB. Speech and thought processing are grossly  intact. Patient reported vitals: denies fevers Denies TTP when she palpates her abdomen  Assessment and Plan:  Generalized abdominal pain  Diarrhea, unspecified type  Gastroesophageal reflux disease, unspecified whether esophagitis present  -we discussed possible serious and likely etiologies, options for evaluation and workup, limitations of telemedicine visit vs in person visit, treatment, treatment risks and precautions. Pt prefers to treat via telemedicine empirically rather then risking or undertaking an in person visit at this moment. Query gastroenteritis, gastritis vs other. Opted to try Pepcid low dose and carafate prn along with low fat, no dairy diet with no red meat for 1 week, probiotic and close self monitoring. Advised if not improving ove rthe next 24-48 hours or if worsening needs to s to seek prompt in person care. She agrees. Has appt with PCP next week.  Follow Up Instructions:   I did not refer this patient for an OV in the next 24 hours for this/these issue(s).  I discussed the assessment and treatment plan with the patient. The patient was provided an opportunity to ask questions and all were answered. The patient agreed with the plan and demonstrated an understanding of the instructions.   The patient was advised to call back or seek an in-person evaluation if the symptoms worsen or if the condition fails to improve as anticipated.  I provided 21 minutes of non-face-to-face time during this encounter.   Lucretia Kern, DO

## 2019-08-04 NOTE — Telephone Encounter (Signed)
10:50am: Called to confirm this am home visit with patient.  April [name] mentioned she had had a tele-health visit with Dr. Maudie Mercury re: abdominal discomfort.   April [name] relays that her back pain is better controlled; "comes and goes".   We rescheduled to Thurs 08/06/2019 @ 1:30pm.  [name] NP-C [phone]

## 2019-08-06 ENCOUNTER — Other Ambulatory Visit: Payer: Medicare PPO | Admitting: Internal Medicine

## 2019-08-06 ENCOUNTER — Other Ambulatory Visit: Payer: Self-pay

## 2019-08-06 DIAGNOSIS — R52 Pain, unspecified: Secondary | ICD-10-CM

## 2019-08-06 DIAGNOSIS — Z515 Encounter for palliative care: Secondary | ICD-10-CM | POA: Diagnosis not present

## 2019-08-11 ENCOUNTER — Other Ambulatory Visit: Payer: Self-pay

## 2019-08-12 ENCOUNTER — Encounter: Payer: Self-pay | Admitting: Internal Medicine

## 2019-08-12 ENCOUNTER — Ambulatory Visit (INDEPENDENT_AMBULATORY_CARE_PROVIDER_SITE_OTHER): Payer: Medicare PPO

## 2019-08-12 ENCOUNTER — Ambulatory Visit: Payer: Medicare PPO | Admitting: Internal Medicine

## 2019-08-12 VITALS — BP 130/80 | HR 103 | Temp 97.3°F | Wt 90.0 lb

## 2019-08-12 DIAGNOSIS — R1030 Lower abdominal pain, unspecified: Secondary | ICD-10-CM

## 2019-08-12 DIAGNOSIS — R140 Abdominal distension (gaseous): Secondary | ICD-10-CM

## 2019-08-12 DIAGNOSIS — K5900 Constipation, unspecified: Secondary | ICD-10-CM | POA: Diagnosis not present

## 2019-08-12 NOTE — Patient Instructions (Signed)
-  Nice seeing you today!!  -Xray today. Will notify you once results are available.

## 2019-08-12 NOTE — Progress Notes (Signed)
Acute Office Visit     This visit occurred during the SARS-CoV-2 public health emergency.  Safety protocols were in place, including screening questions prior to the visit, additional usage of staff PPE, and extensive cleaning of exam room while observing appropriate contact time as indicated for disinfecting solutions.    CC/Reason for Visit: Abdominal pain  HPI: April [name] is a 85 y.o. female who is coming in today for the above mentioned reasons.  Her past medical history is mainly significant for CLL in remission and back pain due to vertebral compression fractures that have recently caused her a lot of issues.  She has weaned completely off narcotics due to concerns with acute metabolic encephalopathy and constipation.  She still has pain.  She is here mainly today to evaluate abdominal pain that she has had since mid December.  It is in her lower abdomen and bilateral lower quadrants, it hurts a lot especially after eating it feels like sharp pains, not cramps.  She has been very bloated, has not had diarrhea she has been having bowel movements once or twice a day so she has not been constipated since stopping narcotics.  She has tried PPIs which she had to discontinue due to muscle cramps.  She saw another provider in this office via telemedicine last week who recommended sucralfate and Pepcid low-dose which she has been taking without much relief.  At that providers recommendation she tried a dairy free diet which has made no change in her abdominal pain but she has lost 6 pounds since December as her main source of nutrition is dairy.  She denies nausea, vomiting, diarrhea.   Past Medical/Surgical History: Past Medical History:  Diagnosis Date  . Breast CA ([place])   . Bronchitis, chronic ([place])   . Eye hemorrhage, left   . 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 Osteoporosis 05/2018   T score -3.1 overall stable from  prior study    Past Surgical History:  Procedure Laterality Date  . ABDOMINAL HYSTERECTOMY  1980  . BREAST IMPLANTS REMOVED  2001  . BREAST SURGERY     Bilateral mastectomy  . IR RADIOLOGIST EVAL & MGMT  07/01/2019  . MASTECTOMY  1982   BILATERAL  . OOPHORECTOMY     BSO  . PARTIAL COLECTOMY     INTESTINAL ABSCESS WITH SALPINGECTOMY  . RECONSTRUCTION LEFT ELBOW    . TONSILLECTOMY    . UMBILLICAL HERNIA REPAIR      Social History:  reports that she quit smoking about 64 years ago. Her smoking use included cigarettes. She has never used smokeless tobacco. She reports that she does not drink alcohol or use drugs.  Allergies: Allergies  Allergen Reactions  . Codeine Nausea Only  . Levofloxacin Rash    Family History:  Family History  Problem Relation Age of Onset  . Cancer Brother        HODGKINS  . Cancer Brother        leukemia  . Parkinson's disease Brother   . Heart disease Mother   . Heart disease Sister      Current Outpatient Medications:  .  acetaminophen (TYLENOL) 500 MG tablet, Take 2 tablets (1,000 mg total) by mouth every 8 (eight) hours. (Patient taking differently: Take 1,000 mg by mouth every 8 (eight) hours as needed for moderate pain. ), Disp: 180 tablet, Rfl: 11 .  augmented betamethasone dipropionate (  DIPROLENE-AF) 0.05 % cream, Apply 1 application topically 2 (two) times daily as needed (flare ups). , Disp: , Rfl: 2 .  bisacodyl (DULCOLAX) 10 MG suppository, Place 1 suppository (10 mg total) rectally as needed for moderate constipation., Disp: 12 suppository, Rfl: 0 .  polyethylene glycol (MIRALAX / GLYCOLAX) 17 g packet, Take 17 g 1-3 times a day as needed for constipation, Disp: 42 each, Rfl: 0 .  senna-docusate (SENOKOT-S) 8.6-50 MG tablet, Take 1 tablet 1-2 times a day as needed for constipation, Disp: 90 tablet, Rfl: 0 .  sucralfate (CARAFATE) 1 GM/10ML suspension, 1 gram 2x daily before meals as needed, Disp: 420 mL, Rfl: 0 .  amLODipine  (NORVASC) 2.5 MG tablet, , Disp: , Rfl:  .  famotidine (PEPCID) 10 MG tablet, Take 1 tablet (10 mg total) by mouth at bedtime. (Patient not taking: Reported on 08/12/2019), Disp: 20 tablet, Rfl: 0 .  omeprazole (PRILOSEC) 20 MG capsule, Take 20 mg by mouth daily., Disp: , Rfl:  .  ondansetron (ZOFRAN) 4 MG tablet, Take 4 mg by mouth every 8 (eight) hours as needed for nausea or vomiting., Disp: , Rfl:  .  prochlorperazine (COMPAZINE) 5 MG tablet, Take 5 mg by mouth every 6 (six) hours as needed for nausea or vomiting. , Disp: , Rfl:   Review of Systems:  Constitutional: Denies fever, chills, diaphoresis. HEENT: Denies photophobia, eye pain, redness, hearing loss, ear pain, congestion, sore throat, rhinorrhea, sneezing, mouth sores, trouble swallowing, neck pain, neck stiffness and tinnitus.   Respiratory: Denies SOB, DOE, cough, chest tightness,  and wheezing.   Cardiovascular: Denies chest pain, palpitations and leg swelling.  Gastrointestinal: Denies nausea, vomiting, diarrhea, constipation, blood in stool. Genitourinary: Denies dysuria, urgency, frequency, hematuria, flank pain and difficulty urinating.  Endocrine: Denies: hot or cold intolerance, sweats, changes in hair or nails, polyuria, polydipsia. Musculoskeletal: Denies joint swelling, arthralgias and gait problem.  Skin: Denies pallor, rash and wound.  Neurological: Denies dizziness, seizures, syncope, light-headedness, numbness and headaches.  Hematological: Denies adenopathy. Easy bruising, personal or family bleeding history  Psychiatric/Behavioral: Denies suicidal ideation, mood changes, confusion, nervousness, sleep disturbance and agitation    Physical Exam: Vitals:   08/12/19 1544  BP: 130/80  Pulse: (!) 103  Temp: (!) 97.3 F (36.3 C)  TempSrc: Temporal  SpO2: 96%  Weight: 90 lb (40.8 kg)    Body mass index is 18.81 kg/m.   Constitutional: NAD, calm Abdomen: Significantly distended, positive bowel sounds,  tympanic to percussion, not tender to palpation. Neurologic: Grossly intact and nonfocal Psychiatric: Normal judgment and insight. Alert and oriented x 3. Normal mood.    Impression and Plan:  Lower abdominal pain  Abdominal distension  -Unlikely small bowel obstruction given symptoms are mild, but ileus is a strong possibility. -She had been on high doses of narcotics up until recently. -Abdominal film today. -If no signs of ileus, we have discussed another trial of PPI therapy which she agrees to. -I do not see any super concerning signs for abdominal infection, do not believe CT scan would be useful at this time.    Patient Instructions  -Nice seeing you today!!  -Xray today. Will notify you once results are available.     Lelon Frohlich, MD [facility] at [facility]

## 2019-08-14 ENCOUNTER — Telehealth: Payer: Self-pay | Admitting: *Deleted

## 2019-08-14 NOTE — Telephone Encounter (Signed)
Patient is aware 

## 2019-08-14 NOTE — Telephone Encounter (Signed)
-----   Message from Erline Hau, MD sent at 08/14/2019 12:58 PM EST ----- Please let her know that her xray showed a large stool burden and this likely explains her pain. Miralax BID, colace BID. She should be having frequent stools.  [place]

## 2019-08-21 ENCOUNTER — Ambulatory Visit: Payer: Medicare PPO | Attending: Internal Medicine

## 2019-08-21 DIAGNOSIS — Z23 Encounter for immunization: Secondary | ICD-10-CM

## 2019-08-21 NOTE — Progress Notes (Signed)
   Covid-19 Vaccination Clinic  Name:  April [name]    MRN: [id number] DOB: 1935/04/23  08/21/2019  Ms. Pace was observed post Covid-19 immunization for 15 minutes without incidence. She was provided with Vaccine Information Sheet and instruction to access the V-Safe system.   [name] was instructed to call 911 with any severe reactions post vaccine: Marland Kitchen Difficulty breathing  . Swelling of your face and throat  . A fast heartbeat  . A bad rash all over your body  . Dizziness and weakness    Immunizations Administered    Name Date Dose VIS Date Route   Pfizer COVID-19 Vaccine 08/21/2019 11:23 AM 0.3 mL 07/10/2019 Intramuscular   Manufacturer: [place]   Lot: [id number]   [place]: [id number]

## 2019-08-27 ENCOUNTER — Other Ambulatory Visit: Payer: Self-pay | Admitting: Family Medicine

## 2019-08-30 ENCOUNTER — Encounter: Payer: Self-pay | Admitting: Internal Medicine

## 2019-08-30 NOTE — Progress Notes (Signed)
Jan 7th, 2020 [facility] Consult Note Telephone: [phone]  Fax: [phone]     Due to the current COVID-19 infection/crises, the family prefer, and have given their verbal consent for, a provider visit via telemedicine. HIPPA policies of confidentially were discussed. Video-audio (telehealth) contact was unable to be done due technical barriers from the patient's side.   PATIENT NAME: April [name] DOB: 12/03/1934 MRN: [id number] [place] [id number] [email or web address]     PRIMARY CARE PROVIDER:   Isaac Bliss, Rayford Halsted, MD [phone] Dr. Heath Lark (Hematology and Oncology [phone]) [facility] [phone])   REFERRING PROVIDER:  Isaac Bliss, Rayford Halsted, MD [place] [address] RESPONSIBLE PARTY:  Lorayne Marek (niece/HCPOA), [phone]    ASSESSMENT / RECOMMENDATIONS:  1.Advance Care Planning:              A. Directives: DNR              B. Goals of Care: Gain strength. Optimize pain control    2. Symptom Management:             A. Pain: 2/2 acute on chronic vertebral fractures (chronic compression fractures T8 and T11; acute endplate fracture of L1).  Tenecia reports her back pain is better. She is managing with just prn Tylenol  (averages 1-2/day) if other measures such a laying down don't work. She was ablet to go out to get her hair done yesterday, and to the grocery store this am. She was surprised how fatiguing this was, and it did precipitate some back pain. She deferred working with [facility] till after the holidays. She can ambulate about the house without use of assistive devices. Mild dyspnea after ambulating about 100 ft or so but quick recovery within a few min. She feels that she is gaining in strength. Lolla reports she has a good appetite, but she has lost a few more lbs. Current weight is 85.8lbs, which is a decrease of 3.2 lbs over the last week.  At a height of 5' her BMI is 16.8 kg/m2. Her stomach is not as sore as it had been. She was told to avoid dairy products, which she is doing and seems to be helping her diarrhea.  We discussed she could supplement her diet with [employer] with Almond milk. She notes resolution of her LE swelling not that off of Norvasc.  -I review and left some low impact LE exercises for her to do.  -discussed she can take up to 6 of her Tylenol 500mg  a day.   -prn Kopectate for loose stool; follow package instructions.   3. Family / Community Supports: PT by [facility] [phone]). Married 60 yrs. No children. They are close to their niece Lorayne Marek who is also their HCPOA [facility]); she visits frequently.               4. Follow up Palliative Care Visit: Friday 09/11/2019 at 12:20pm. I called niece Lattie Haw and updated her with today's visit.   I spent 30 minutes providing this consultation from 11am to 11:30am. More than 50% of the time in this consultation was spent coordinating communication. Lattie Haw mentions that patient has been interviewing for an aide to assist her in the home.   HISTORY OF PRESENT ILLNESS:  April [name] is an 85 y.o. female with medical h/o CLL (now in remission), breast cancer, osteoporosis, vertebral fractures. This is a  f/u Palliative Care Visit from Dec 31st.    CODE STATUS: DNR   PPS: 60%   HOSPICE ELIGIBILITY/DIAGNOSIS: TBD  PAST MEDICAL HISTORY:  Past Medical History:  Diagnosis Date  . Breast CA ([place])   . Bronchitis, chronic ([place])   . Eye hemorrhage, left   . 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 Osteoporosis 05/2018   T score -3.1 overall stable from prior study    SOCIAL HX:  Social History   Tobacco Use  . Smoking status: Former Smoker    Types: Cigarettes    Quit date: 03/26/1955    Years since quitting: 64.4  . Smokeless tobacco: Never Used  Substance Use Topics    . Alcohol use: Never    ALLERGIES:  Allergies  Allergen Reactions  . Codeine Nausea Only  . Levofloxacin Rash     PERTINENT MEDICATIONS:  Outpatient Encounter Medications as of 08/06/2019  Medication Sig  . acetaminophen (TYLENOL) 500 MG tablet Take 2 tablets (1,000 mg total) by mouth every 8 (eight) hours. (Patient taking differently: Take 1,000 mg by mouth every 8 (eight) hours as needed for moderate pain. )  . augmented betamethasone dipropionate (DIPROLENE-AF) 0.05 % cream Apply 1 application topically 2 (two) times daily as needed (flare ups).   . bisacodyl (DULCOLAX) 10 MG suppository Place 1 suppository (10 mg total) rectally as needed for moderate constipation.  . ondansetron (ZOFRAN) 4 MG tablet Take 4 mg by mouth every 8 (eight) hours as needed for nausea or vomiting.  . polyethylene glycol (MIRALAX / GLYCOLAX) 17 g packet Take 17 g 1-3 times a day as needed for constipation  . prochlorperazine (COMPAZINE) 5 MG tablet Take 5 mg by mouth every 6 (six) hours as needed for nausea or vomiting.   . senna-docusate (SENOKOT-S) 8.6-50 MG tablet Take 1 tablet 1-2 times a day as needed for constipation  . [DISCONTINUED] famotidine (PEPCID) 10 MG tablet Take 1 tablet (10 mg total) by mouth at bedtime. (Patient not taking: Reported on 08/12/2019)  . [DISCONTINUED] sucralfate (CARAFATE) 1 GM/10ML suspension 1 gram 2x daily before meals as needed   No facility-administered encounter medications on file as of 08/06/2019.    PHYSICAL EXAM:   General: NAD, frail appearing, thin. He husband is sitting in the next room.Patietn is alert and pleasantly conversant PE deferred to decrease risk of COVID exposure Neurological: Weakness but otherwise non-focal  Julianne Handler, NP

## 2019-09-04 ENCOUNTER — Telehealth: Payer: Self-pay | Admitting: Internal Medicine

## 2019-09-04 NOTE — Telephone Encounter (Signed)
Rec'd call from patient needing to reschedule the 2/12 Palliative f/u visit due to getting her second COVID shot at that time.  This was rescheduled for 09/17/19 @ 12:30 PM

## 2019-09-11 ENCOUNTER — Ambulatory Visit: Payer: Medicare PPO | Attending: Internal Medicine

## 2019-09-11 DIAGNOSIS — Z23 Encounter for immunization: Secondary | ICD-10-CM | POA: Insufficient documentation

## 2019-09-11 NOTE — Progress Notes (Signed)
   Covid-19 Vaccination Clinic  Name:  April [name]    MRN: [id number] DOB: 01/03/1935  09/11/2019  Ms. Cage was observed post Covid-19 immunization for 15 minutes without incidence. She was provided with Vaccine Information Sheet and instruction to access the V-Safe system.   [name] was instructed to call 911 with any severe reactions post vaccine: Marland Kitchen Difficulty breathing  . Swelling of your face and throat  . A fast heartbeat  . A bad rash all over your body  . Dizziness and weakness    Immunizations Administered    Name Date Dose VIS Date Route   Pfizer COVID-19 Vaccine 09/11/2019 12:06 PM 0.3 mL 07/10/2019 Intramuscular   Manufacturer: [place]   Lot: [id number]   [place]: [id number]

## 2019-09-17 ENCOUNTER — Encounter: Payer: Self-pay | Admitting: Internal Medicine

## 2019-09-17 ENCOUNTER — Other Ambulatory Visit: Payer: Medicare PPO | Admitting: Internal Medicine

## 2019-09-17 ENCOUNTER — Other Ambulatory Visit: Payer: Self-pay

## 2019-09-17 DIAGNOSIS — R1084 Generalized abdominal pain: Secondary | ICD-10-CM

## 2019-09-17 DIAGNOSIS — Z515 Encounter for palliative care: Secondary | ICD-10-CM

## 2019-09-17 NOTE — Progress Notes (Signed)
Feb 18th, 2020 [facility] Palliative Care Consult Note Telephone: [phone]  Fax: [phone]       PATIENT NAME: April [name] DOB: 01/14/1935 MRN: [id number] [place] [id number] [email or web address]     PRIMARY CARE PROVIDER:   Isaac Bliss, Rayford Halsted, MD [phone] [address] Dr. Heath Lark (Hematology and Oncology [phone])   REFERRING PROVIDER:  Isaac Bliss, Rayford Halsted, MD [place] [address] RESPONSIBLE PARTY:  Lorayne Marek (niece/HCPOA), [phone]    ASSESSMENT / RECOMMENDATIONS:  1.Advance Care Planning:              A. Directives: DNR              B. Goals of Care: Gain strength. Optimize pain control    2. Symptom Management: A. Pain: 2/2 acute on chronic vertebral fractures (chronic compression fractures T8 and T11; acute endplate fracture of L1). Ongoing back pain but well managed with prn Tylenol (averages few tabs/week). Interestingly her back pain is exacerbated by her abd pain. She can stand only about 30 min then needs to sit b/c of back pain.   B. She continues with abdominal distension. She does not believe she is constipated. Last OV with her PCG abd film with large stool burden in her colon; treated with increased laxatives. Currently paitent is moving her bowels almost qd. Stools are soft, no straining. She is not taking any medications. Abdominal pain exacerbated post prandial. If she lays down right after eating, she can mitigate that discomfort. She says she has constant abd pain, but when I asked if she had any right now at the time of our meeting, she surprised herself by noting she was pain free. She also had taken a Tylenol this morning for back/neck discomfort, so perhaps this had something to so with it. Past trial of dairy free diet without much change abdominal discomfort.   C. Improving stamina, though feels like she's only 50% compared to her baseline last year. Able to get  out of the home for grocery shopping but is pooped before she reaches the checkout counter. Independent in all ADLs, including showering though this is fatiguing. She assists with making meals. She has the home exercises I gave her last month but admits to not doing on a regular basis.   -I encouraged her to take ylenol 500mg  qid; see if that helps keep her more consistently out of pain.  -try a gluten free diet for a few days. She has family members who are gluten intolerant; mentions she has familiarity with those restrictions.  -encouraged to do her exercises on a more regular, daily basis              3. Family / Community Supports:  62 yrs. No children. They are close to their niece Lorayne Marek who is also their HCPOA [facility]); they have at least weekly contact.               4. Follow up Palliative Care Visit: Agnes is doing well; she will call me if needed. She has my contact information   I spent 60 minutes providing this consultation from noon to 1pm. More than 50% of the time in this consultation was spent coordinating communication. Lattie Haw mentions that patient has been interviewing for an aide to assist her in the home.   HISTORY OF PRESENT ILLNESS:  April [name] is an 85 y.o. female with medical h/o CLL (now in remission),  breast cancer, osteoporosis, vertebral fractures. This is a f/u Palliative Care Visit from 08/06/2019.    CODE STATUS: DNR   PPS: 70% (improved from 60% 1 month earlier)   HOSPICE ELIGIBILITY/DIAGNOSIS: TBD  PAST MEDICAL HISTORY:  Past Medical History:  Diagnosis Date  . Breast CA ([place])   . Bronchitis, chronic ([place])   . Eye hemorrhage, left   . 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 Osteoporosis 05/2018   T score -3.1 overall stable from prior study    SOCIAL HX:  Social History   Tobacco Use  . Smoking status: Former Smoker    Types: Cigarettes    Quit date: 03/26/1955    Years  since quitting: 64.5  . Smokeless tobacco: Never Used  Substance Use Topics  . Alcohol use: Never    ALLERGIES:  Allergies  Allergen Reactions  . Codeine Nausea Only  . Levofloxacin Rash     PERTINENT MEDICATIONS:  Outpatient Encounter Medications as of 09/17/2019  Medication Sig  . [employer] ACID CONTROLLER 10 MG tablet TAKE 1 TABLET BY MOUTH EVERYDAY AT BEDTIME (Patient not taking: Reported on 09/17/2019)  . sucralfate (CARAFATE) 1 GM/10ML suspension 1 GRAM 2X DAILY BEFORE MEALS AS NEEDED (Patient not taking: Reported on 09/17/2019)  . acetaminophen (TYLENOL) 500 MG tablet Take 2 tablets (1,000 mg total) by mouth every 8 (eight) hours. (Patient taking differently: Take 1,000 mg by mouth every 8 (eight) hours as needed for moderate pain. )  . augmented betamethasone dipropionate (DIPROLENE-AF) 0.05 % cream Apply 1 application topically 2 (two) times daily as needed (flare ups).   . bisacodyl (DULCOLAX) 10 MG suppository Place 1 suppository (10 mg total) rectally as needed for moderate constipation.  Marland Kitchen omeprazole (PRILOSEC) 20 MG capsule Take 20 mg by mouth daily.  . ondansetron (ZOFRAN) 4 MG tablet Take 4 mg by mouth every 8 (eight) hours as needed for nausea or vomiting.  . polyethylene glycol (MIRALAX / GLYCOLAX) 17 g packet Take 17 g 1-3 times a day as needed for constipation (Patient not taking: Reported on 09/17/2019)  . prochlorperazine (COMPAZINE) 5 MG tablet Take 5 mg by mouth every 6 (six) hours as needed for nausea or vomiting.   . senna-docusate (SENOKOT-S) 8.6-50 MG tablet Take 1 tablet 1-2 times a day as needed for constipation (Patient not taking: Reported on 09/17/2019)  . [DISCONTINUED] amLODipine (NORVASC) 2.5 MG tablet    No facility-administered encounter medications on file as of 09/17/2019.    PHYSICAL EXAM:   General: thin, frail appearing, pleasantly conversant. Husband is available within the home PE limited to decrease COVID-19 exposure Extremities: no edema, no joint  deformities Skin: no rashes Neurological: Weakness but otherwise nonfocal  Julianne Handler, NP

## 2019-09-25 ENCOUNTER — Telehealth: Payer: Self-pay

## 2019-09-25 NOTE — Telephone Encounter (Signed)
Called and given below message. She verbalized understanding. She would like appt and will bring husband to appt. The only thing she is taking now for her pain now is tylenol and she feels more clear headed with just that. Told her scheduling would call some time next week with appt.

## 2019-09-25 NOTE — Telephone Encounter (Signed)
She called and left a message to call her.  Called back. She is asking for advice and appt. She is complaining of pain in the spleen area that is sharp and abdominal pain now. She is worried about the CLL. What does Dr. Alvy Bimler think? Should she see a GI physician? She would like appt with Dr. Alvy Bimler.

## 2019-09-25 NOTE — Telephone Encounter (Signed)
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

## 2019-09-28 ENCOUNTER — Ambulatory Visit (INDEPENDENT_AMBULATORY_CARE_PROVIDER_SITE_OTHER): Payer: Medicare PPO | Admitting: Obstetrics and Gynecology

## 2019-09-28 ENCOUNTER — Telehealth: Payer: Self-pay | Admitting: Hematology and Oncology

## 2019-09-28 ENCOUNTER — Other Ambulatory Visit: Payer: Self-pay

## 2019-09-28 ENCOUNTER — Encounter: Payer: Self-pay | Admitting: Obstetrics and Gynecology

## 2019-09-28 VITALS — BP 118/66

## 2019-09-28 DIAGNOSIS — R102 Pelvic and perineal pain: Secondary | ICD-10-CM

## 2019-09-28 DIAGNOSIS — N898 Other specified noninflammatory disorders of vagina: Secondary | ICD-10-CM | POA: Diagnosis not present

## 2019-09-28 LAB — URINALYSIS, COMPLETE W/RFL CULTURE
Bacteria, UA: NONE SEEN /HPF
Bilirubin Urine: NEGATIVE
Glucose, UA: NEGATIVE
Hgb urine dipstick: NEGATIVE
Hyaline Cast: NONE SEEN /LPF
Ketones, ur: NEGATIVE
Leukocyte Esterase: NEGATIVE
Nitrites, Initial: NEGATIVE
Protein, ur: NEGATIVE
RBC / HPF: NONE SEEN /HPF (ref 0–2)
Specific Gravity, Urine: 1.025 (ref 1.001–1.03)
WBC, UA: NONE SEEN /HPF (ref 0–5)
pH: 5.5 (ref 5.0–8.0)

## 2019-09-28 LAB — WET PREP FOR TRICH, YEAST, CLUE

## 2019-09-28 LAB — NO CULTURE INDICATED

## 2019-09-28 NOTE — Progress Notes (Signed)
   April [name]  05/17/1935 [id number]  HPI The patient is a 85 y.o. G0P0 who presents today to evaluate for vaginal yeast infection due to symptom of urinary hesitancy.  She was able to leave a urine sample without difficulty in the restroom.  She is not having any other symptoms of pain with urination, increased urinary frequency, or fever/chills.  She has been treated for CLL and currently is in remission.  She has had an abdominal pain and feels that it is mostly in the left upper quadrant but sometimes will move across the mid abdomen to both sides.   She did have a CT of the abdomen pelvis under 3 months ago that did not reveal any concerning pelvic findings, but did show biliary duct changes, diverticula and bladder distention.  She is not having any vaginal discharge, itching, or vaginal bleeding.  Past medical history,surgical history, problem list, medications, allergies, family history and social history were all reviewed and documented as reviewed in the EPIC chart.   Physical Exam  BP 118/66   LMP 03/26/1979   General: Pleasant elderly female, no acute distress, alert and oriented Abdomen: Soft, nondistended, mild tenderness to deep palpation at the left costal margin, no abdominal pain elsewhere, no rebound or guarding PELVIC EXAM: VULVA: normal appearing vulva with no masses, tenderness or lesions, +atrophic changes noted, VAGINA: normal appearing vagina with normal color and discharge, no lesions, no appreciable cystocele, CERVIX: surgically absent, UTERUS: surgically absent, vaginal cuff well healed, ADNEXA: normal adnexa in size, nontender and no masses, no masses, nontender  Vaginal wet mount negative, no hyphae, trichomonads, clue cells, few WBC, moderate bacteria, less than 6 epithelial cells/hpf Urinalysis negative [name] present for the examination  Assessment 85 yo G0 here with urinary hesitancy and mid upper abdominal pain  Plan No evidence of vaginal yeast  infection or UTI on today's evaluation.  The patient is reassured by this.  She will plan to see her oncologist for further evaluation of her upper to mid abdominal pain.  Appears she has been told that splenomegaly caused by the CLL would cause upper abdominal pain, and this would make sense given she mostly notes LUQ pain today.  She has had a prior TAH and BSO.  Encouraged her to keep her bladder empty with regular voids.  Joseph Pierini MD 09/28/19

## 2019-09-28 NOTE — Telephone Encounter (Signed)
Scheduled appt per 3/1 sch message - pt is aware of appt date and time

## 2019-09-28 NOTE — Telephone Encounter (Signed)
I sent scheduling msg to see her on Wednesday

## 2019-09-30 ENCOUNTER — Encounter: Payer: Self-pay | Admitting: Hematology and Oncology

## 2019-09-30 ENCOUNTER — Inpatient Hospital Stay: Payer: Medicare PPO | Attending: Hematology and Oncology | Admitting: Hematology and Oncology

## 2019-09-30 ENCOUNTER — Telehealth: Payer: Self-pay | Admitting: Hematology and Oncology

## 2019-09-30 ENCOUNTER — Inpatient Hospital Stay: Payer: Medicare PPO

## 2019-09-30 ENCOUNTER — Other Ambulatory Visit: Payer: Self-pay

## 2019-09-30 VITALS — BP 126/77 | HR 101 | Temp 97.6°F | Resp 18 | Ht <= 58 in | Wt 90.0 lb

## 2019-09-30 DIAGNOSIS — C9110 Chronic lymphocytic leukemia of B-cell type not having achieved remission: Secondary | ICD-10-CM | POA: Insufficient documentation

## 2019-09-30 DIAGNOSIS — Z79899 Other long term (current) drug therapy: Secondary | ICD-10-CM | POA: Diagnosis not present

## 2019-09-30 DIAGNOSIS — D696 Thrombocytopenia, unspecified: Secondary | ICD-10-CM | POA: Diagnosis not present

## 2019-09-30 DIAGNOSIS — R634 Abnormal weight loss: Secondary | ICD-10-CM | POA: Diagnosis not present

## 2019-09-30 DIAGNOSIS — R748 Abnormal levels of other serum enzymes: Secondary | ICD-10-CM | POA: Diagnosis not present

## 2019-09-30 DIAGNOSIS — Z7189 Other specified counseling: Secondary | ICD-10-CM | POA: Diagnosis not present

## 2019-09-30 DIAGNOSIS — I700 Atherosclerosis of aorta: Secondary | ICD-10-CM | POA: Diagnosis not present

## 2019-09-30 DIAGNOSIS — R1084 Generalized abdominal pain: Secondary | ICD-10-CM | POA: Diagnosis not present

## 2019-09-30 DIAGNOSIS — M549 Dorsalgia, unspecified: Secondary | ICD-10-CM

## 2019-09-30 LAB — COMPREHENSIVE METABOLIC PANEL
ALT: 53 U/L — ABNORMAL HIGH (ref 0–44)
AST: 38 U/L (ref 15–41)
Albumin: 4.1 g/dL (ref 3.5–5.0)
Alkaline Phosphatase: 173 U/L — ABNORMAL HIGH (ref 38–126)
Anion gap: 8 (ref 5–15)
BUN: 12 mg/dL (ref 8–23)
CO2: 26 mmol/L (ref 22–32)
Calcium: 9.6 mg/dL (ref 8.9–10.3)
Chloride: 107 mmol/L (ref 98–111)
Creatinine, Ser: 0.71 mg/dL (ref 0.44–1.00)
GFR calc Af Amer: >60 mL/min (ref >60)
GFR calc non Af Amer: >60 mL/min (ref >60)
Glucose, Bld: 102 mg/dL — ABNORMAL HIGH (ref 70–99)
Potassium: 4.1 mmol/L (ref 3.5–5.1)
Sodium: 141 mmol/L (ref 135–145)
Total Bilirubin: 1.4 mg/dL — ABNORMAL HIGH (ref 0.3–1.2)
Total Protein: 6.7 g/dL (ref 6.5–8.1)

## 2019-09-30 LAB — CBC WITH DIFFERENTIAL/PLATELET
Abs Immature Granulocytes: 0.02 10*3/uL (ref 0.00–0.07)
Basophils Absolute: 0 10*3/uL (ref 0.0–0.1)
Basophils Relative: 0 %
Eosinophils Absolute: 0.1 10*3/uL (ref 0.0–0.5)
Eosinophils Relative: 1 %
HCT: 40 % (ref 36.0–46.0)
Hemoglobin: 13.1 g/dL (ref 12.0–15.0)
Immature Granulocytes: 0 %
Lymphocytes Relative: 46 %
Lymphs Abs: 3.4 10*3/uL (ref 0.7–4.0)
MCH: 32.8 pg (ref 26.0–34.0)
MCHC: 32.8 g/dL (ref 30.0–36.0)
MCV: 100.3 fL — ABNORMAL HIGH (ref 80.0–100.0)
Monocytes Absolute: 0.6 10*3/uL (ref 0.1–1.0)
Monocytes Relative: 8 %
Neutro Abs: 3.2 10*3/uL (ref 1.7–7.7)
Neutrophils Relative %: 45 %
Platelets: 147 10*3/uL — ABNORMAL LOW (ref 150–400)
RBC: 3.99 MIL/uL (ref 3.87–5.11)
RDW: 14 % (ref 11.5–15.5)
WBC: 7.2 10*3/uL (ref 4.0–10.5)
nRBC: 0 % (ref 0.0–0.2)

## 2019-09-30 LAB — URIC ACID: Uric Acid, Serum: 3.7 mg/dL (ref 2.5–7.1)

## 2019-09-30 LAB — LACTATE DEHYDROGENASE: LDH: 163 U/L (ref 98–192)

## 2019-09-30 NOTE — Assessment & Plan Note (Signed)
Her blood work showed positive response to treatment She has no detectable lymphocytosis She has very mild thrombocytopenia which could be related to splenomegaly or persistent CLL After a lot of discussion, were in agreement to pursue CT imaging of her abdomen and pelvis for further evaluation

## 2019-09-30 NOTE — Telephone Encounter (Signed)
Scheduled appt per 3/3 sch message -unable to reach pt- left message with appt date and time

## 2019-09-30 NOTE — Assessment & Plan Note (Signed)
Her lower back pain symptom is not consistent Previously, I did refer her to see interventional radiologist for possible kyphoplasty but they are not able to determine the exact location of her symptom and hence it was not done

## 2019-09-30 NOTE — Assessment & Plan Note (Signed)
She has nonspecific abdominal pain in the central area of her abdomen as well as left upper quadrant pain Previously, it was felt that her pain was related to CLL She does not tolerate narcotic prescription well Despite weekly virtual visit and aggressive management of side effects of pain medicine, she ended up in the hospital with confusion secondary to pain medicine Just as before, the patient quoted, "I cannot live like this" In a compassionate manner, I told the patient, I do not wish for her to suffer in pain but she has extremely low tolerance to narcotic prescription She also did not tolerate prednisone therapy and that accelerated the risk of fracture/osteoporosis Ultimately, we are in agreement to pursue CT imaging for further evaluation of the cause of her pain Her husband thought that he did not throw away her narcotic prescription from the past He will call me once he is able to find her previous narcotic prescription so that we can try to reinitiate low-dose pain medicine for pain control

## 2019-09-30 NOTE — Assessment & Plan Note (Signed)
The cause is unknown I will order CT scan of the abdomen and pelvis for further evaluation

## 2019-09-30 NOTE — Assessment & Plan Note (Signed)
We discussed the goals of care Previously, due to her significant decline, she was referred for home-based palliative care She did not find it helpful She told me nobody will help her with her current pain situation As above, we will tackle her problem 1 thing at a time and I plan to see her back next week for further discussion and review of plan of care

## 2019-09-30 NOTE — Progress Notes (Signed)
[place] OFFICE PROGRESS NOTE  Patient Care Team: Isaac Bliss, Rayford Halsted, MD as PCP - General (Internal Medicine) Julianne Handler, NP as Nurse Practitioner (Hospice and Palliative Medicine)  ASSESSMENT & PLAN:  CLL (chronic lymphocytic leukemia) ([place]) Her blood work showed positive response to treatment She has no detectable lymphocytosis She has very mild thrombocytopenia which could be related to splenomegaly or persistent CLL After a lot of discussion, were in agreement to pursue CT imaging of her abdomen and pelvis for further evaluation  Abdominal pain She has nonspecific abdominal pain in the central area of her abdomen as well as left upper quadrant pain Previously, it was felt that her pain was related to CLL She does not tolerate narcotic prescription well Despite weekly virtual visit and aggressive management of side effects of pain medicine, she ended up in the hospital with confusion secondary to pain medicine Just as before, the patient quoted, "I cannot live like this" In a compassionate manner, I told the patient, I do not wish for her to suffer in pain but she has extremely low tolerance to narcotic prescription She also did not tolerate prednisone therapy and that accelerated the risk of fracture/osteoporosis Ultimately, we are in agreement to pursue CT imaging for further evaluation of the cause of her pain Her husband thought that he did not throw away her narcotic prescription from the past He will call me once he is able to find her previous narcotic prescription so that we can try to reinitiate low-dose pain medicine for pain control  Abnormal liver enzymes The cause is unknown I will order CT scan of the abdomen and pelvis for further evaluation  Intractable back pain Her lower back pain symptom is not consistent Previously, I did refer her to see interventional radiologist for possible kyphoplasty but they are not able to determine the exact  location of her symptom and hence it was not done  Goals of care, counseling/discussion We discussed the goals of care Previously, due to her significant decline, she was referred for home-based palliative care She did not find it helpful She told me nobody will help her with her current pain situation As above, we will tackle her problem 1 thing at a time and I plan to see her back next week for further discussion and review of plan of care  Weight loss, abnormal She has profound weight loss since last time I saw her Clinically, she appears to be cachectic but according to the patient, she is eating well As above, I plan to order CT imaging to evaluate and rule out malignancy as a cause of her recent weight loss and abdominal pain   Orders Placed This Encounter  Procedures  . CT ABDOMEN PELVIS W CONTRAST    Standing Status:   Future    Standing Expiration Date:   09/29/2020    Order Specific Question:   If indicated for the ordered procedure, I authorize the administration of contrast media per Radiology protocol    Answer:   Yes    Order Specific Question:   Preferred imaging location?    Answer:   [facility]    Order Specific Question:   Radiology Contrast Protocol - do NOT remove file path    Answer:   [email or web address]    Order Specific Question:   ** REASON FOR EXAM (FREE TEXT)    Answer:   severe abdominal pain, hx CLL    All questions were answered. The patient  knows to call the clinic with any problems, questions or concerns. The total time spent in the appointment was 40 minutes encounter with patients including review of chart and various tests results, discussions about plan of care and coordination of care plan   Heath Lark, MD 09/30/2019 11:42 AM  INTERVAL HISTORY: Please see below for problem oriented charting. She requested this visit due to significant left upper quadrant pain and abdominal pain.  She is here accompanied by her  husband today She had recurrent hospitalization over the past few months secondary to severe pain and poor tolerance to pain medicine Subsequently, she was discharged home with palliative care and she was off all narcotic prescription Currently, she is back to her baseline in terms of mental status She say Tylenol and heat pad does not alleviate her pain Her pain is so severe that she does not want to live like this She has contacted her primary care doctor and other doctors and nobody is willing to prescribe her pain medicine due to her recent history of confusion secondary to narcotic prescription She stated she has good appetite but she have lost almost 15 pounds since last time I saw her in the office She denies recent nausea or changes in bowel habits No recent falls No difficulties with urination She is concerned about abdominal swelling  SUMMARY OF ONCOLOGIC HISTORY: Oncology History Overview Note  Del 13 q   CLL (chronic lymphocytic leukemia) ([place])  03/15/2009 Pathology Results   Case #: [id number]  flow cytometry of peripheral blood comfirmed CLL.   04/25/2017 Pathology Results   FISH panel is positive for deletion 13 q only   04/22/2019 Imaging   Ct scan of the chest, abdomen and pelvis 1. No adenopathy within the chest, abdomen or pelvis. 2. Splenomegaly with scattered low-density lesions measuring up to 1.3 cm. 3. Small subpleural nodule in the left lower lobe and right middle lobe measuring up to 5 mm. No follow-up needed if patient is low-risk (and has no known or suspected primary neoplasm). Non-contrast chest CT can be considered in 12 months if patient is high-risk. This recommendation follows the consensus statement: Guidelines for Management of Incidental Pulmonary Nodules Detected on CT Images: From the Fleischner Society 2017; Radiology 2017; [id number]. 4. Aortic Atherosclerosis (ICD10-I70.0). Coronary artery atherosclerotic calcifications.     04/23/2019 Cancer  Staging   Staging form: Chronic Lymphocytic Leukemia / Small Lymphocytic Lymphoma, AJCC 8th Edition - Clinical stage from 04/23/2019: Modified Rai Stage IV (Modified Rai risk: High, Lymphocytosis: Present, Adenopathy: Absent, Organomegaly: Present, Anemia: Absent, Thrombocytopenia: Present) - Signed by Heath Lark, MD on 04/23/2019   04/28/2019 -  Chemotherapy   The patient had Ibrutinib for chemotherapy treatment.     06/01/2019 -  Chemotherapy   The patient had Bendamustine for chemotherapy   06/02/2019 Imaging   Ct head, chest, abdomen and pelvis 1. New T8 compression deformity with approximately 30% height loss. Worsening anterior compression deformity of the T11 vertebrae now with 20% height loss anteriorly. Additional T12 and L2 compression deformities are stable from prior. 2. No other acute traumatic injury within the chest, abdomen, or pelvis. Specifically, no splenic injury or retroperitoneal hematoma. 3. Mild intra and extrahepatic biliary ductal dilatation with a small calcified gallstone seen in the distal common bile duct just proximal to the ampulla of Vater. Correlate with LFTs and consider further evaluation with MRCP as clinically indicated. 4. Stable sub 5 mm nodules in the right middle and left lower lobe. No further evaluation  is required if patient is low risk however should continue with previously recommended 12 month follow-up in September of 2021 if patient is high risk. 5. Severe scoliotic curvature of the thoracolumbar spine with associated chest wall deformity. 6. Duodenal and colonic diverticula without features of diverticulitis. 7. Moderate bladder distention, correlate for features of outlet obstruction or retention. 8. Aortic Atherosclerosis (ICD10-I70.0).   06/07/2019 - 06/10/2019 Hospital Admission   She was admitted for management of LUQ and back pain   06/08/2019 Imaging   MRI abdomen 1. Mild intra and extrahepatic biliary duct prominence with common bile  duct measuring upper normal for patient age. No gallstones evident and no definite choledocholithiasis. Somewhat abrupt transition of the duct into the ampulla noted, but nonspecific. ERCP could be used to further evaluate for ampullary lesion as clinically warranted. 2. Hepatic and renal cysts. 3. Presumed marked distention of the urinary bladder.   06/08/2019 Imaging   Ct angiogram No gross evidence of large or central pulmonary emboli is identified on exam limited secondary to suboptimal pulmonary arterial opacification and respiratory motion.   Bibasilar atelectasis.   Osseous demineralization with compression fractures of several thoracic vertebra.   Aneurysmal dilatation ascending thoracic aorta 4.1 cm transverse, recommendation below.   Recommend annual imaging followup by CTA or MRA.   Aortic Atherosclerosis (ICD10-I70.0).   Aortic aneurysm NOS     REVIEW OF SYSTEMS:   Constitutional: Denies fevers, chills  Eyes: Denies blurriness of vision Ears, nose, mouth, throat, and face: Denies mucositis or sore throat Respiratory: Denies cough, dyspnea or wheezes Cardiovascular: Denies palpitation, chest discomfort or lower extremity swelling Gastrointestinal:  Denies nausea, heartburn or change in bowel habits Skin: Denies abnormal skin rashes Lymphatics: Denies new lymphadenopathy or easy bruising Neurological:Denies numbness, tingling or new weaknesses Behavioral/Psych: Mood is stable, no new changes  All other systems were reviewed with the patient and are negative.  I have reviewed the past medical history, past surgical history, social history and family history with the patient and they are unchanged from previous note.  ALLERGIES:  is allergic to codeine and levofloxacin.  MEDICATIONS:  Current Outpatient Medications  Medication Sig Dispense Refill  . [employer] ACID CONTROLLER 10 MG tablet TAKE 1 TABLET BY MOUTH EVERYDAY AT BEDTIME (Patient not taking: Reported on 09/17/2019)  30 tablet 0  . acetaminophen (TYLENOL) 500 MG tablet Take 2 tablets (1,000 mg total) by mouth every 8 (eight) hours. (Patient taking differently: Take 1,000 mg by mouth every 8 (eight) hours as needed for moderate pain. ) 180 tablet 11  . augmented betamethasone dipropionate (DIPROLENE-AF) 0.05 % cream Apply 1 application topically 2 (two) times daily as needed (flare ups).   2  . bisacodyl (DULCOLAX) 10 MG suppository Place 1 suppository (10 mg total) rectally as needed for moderate constipation. 12 suppository 0  . omeprazole (PRILOSEC) 20 MG capsule Take 20 mg by mouth daily.    . ondansetron (ZOFRAN) 4 MG tablet Take 4 mg by mouth every 8 (eight) hours as needed for nausea or vomiting.    . polyethylene glycol (MIRALAX / GLYCOLAX) 17 g packet Take 17 g 1-3 times a day as needed for constipation 42 each 0  . prochlorperazine (COMPAZINE) 5 MG tablet Take 5 mg by mouth every 6 (six) hours as needed for nausea or vomiting.     . senna-docusate (SENOKOT-S) 8.6-50 MG tablet Take 1 tablet 1-2 times a day as needed for constipation 90 tablet 0  . sucralfate (CARAFATE) 1 GM/10ML suspension 1 GRAM  2X DAILY BEFORE MEALS AS NEEDED (Patient not taking: Reported on 09/28/2019) 420 mL 0   No current facility-administered medications for this visit.    PHYSICAL EXAMINATION: ECOG PERFORMANCE STATUS: 2 - Symptomatic, <50% confined to bed  Vitals:   09/30/19 1040  BP: 126/77  Pulse: (!) 101  Resp: 18  Temp: 97.6 F (36.4 C)  SpO2: 99%   Filed Weights   09/30/19 1040  Weight: 90 lb (40.8 kg)    GENERAL:alert, somewhat in distress from abdominal pain.  She looks extremely malnourished, thin and cachectic.  Examination is limited with her sitting on the wheelchair SKIN: skin color, texture, turgor are normal, no rashes or significant lesions EYES: normal, Conjunctiva are pink and non-injected, sclera clear OROPHARYNX:no exudate, no erythema and lips, buccal mucosa, and tongue normal  NECK: supple,  thyroid normal size, non-tender, without nodularity LYMPH:  no palpable lymphadenopathy in the cervical, axillary or inguinal LUNGS: clear to auscultation and percussion with normal breathing effort HEART: regular rate & rhythm and no murmurs and no lower extremity edema ABDOMEN:abdomen soft, non-tender and normal bowel sounds.  Suprapubic mass is palpable but it could be her bladder.  No rebound or guarding Musculoskeletal:no cyanosis of digits and no clubbing  NEURO: alert & oriented x 3 with fluent speech, no focal motor/sensory deficits  LABORATORY DATA:  I have reviewed the data as listed    Component Value Date/Time   NA 141 09/30/2019 1025   NA 141 04/25/2017 1041   K 4.1 09/30/2019 1025   K 4.6 04/25/2017 1041   CL 107 09/30/2019 1025   CL 104 10/27/2012 1209   CO2 26 09/30/2019 1025   CO2 26 04/25/2017 1041   GLUCOSE 102 (H) 09/30/2019 1025   GLUCOSE 97 04/25/2017 1041   GLUCOSE 104 (H) 10/27/2012 1209   BUN 12 09/30/2019 1025   BUN 13.1 04/25/2017 1041   CREATININE 0.71 09/30/2019 1025   CREATININE 0.8 04/25/2017 1041   CALCIUM 9.6 09/30/2019 1025   CALCIUM 9.8 04/25/2017 1041   PROT 6.7 09/30/2019 1025   PROT 7.5 04/25/2017 1041   ALBUMIN 4.1 09/30/2019 1025   ALBUMIN 4.4 04/25/2017 1041   AST 38 09/30/2019 1025   AST 23 04/25/2017 1041   ALT 53 (H) 09/30/2019 1025   ALT 14 04/25/2017 1041   ALKPHOS 173 (H) 09/30/2019 1025   ALKPHOS 69 04/25/2017 1041   BILITOT 1.4 (H) 09/30/2019 1025   BILITOT 0.95 04/25/2017 1041   GFRNONAA >60 09/30/2019 1025   GFRAA >60 09/30/2019 1025    No results found for: SPEP, UPEP  Lab Results  Component Value Date   WBC 7.2 09/30/2019   NEUTROABS 3.2 09/30/2019   HGB 13.1 09/30/2019   HCT 40.0 09/30/2019   MCV 100.3 (H) 09/30/2019   PLT 147 (L) 09/30/2019      Chemistry      Component Value Date/Time   NA 141 09/30/2019 1025   NA 141 04/25/2017 1041   K 4.1 09/30/2019 1025   K 4.6 04/25/2017 1041   CL 107  09/30/2019 1025   CL 104 10/27/2012 1209   CO2 26 09/30/2019 1025   CO2 26 04/25/2017 1041   BUN 12 09/30/2019 1025   BUN 13.1 04/25/2017 1041   CREATININE 0.71 09/30/2019 1025   CREATININE 0.8 04/25/2017 1041      Component Value Date/Time   CALCIUM 9.6 09/30/2019 1025   CALCIUM 9.8 04/25/2017 1041   ALKPHOS 173 (H) 09/30/2019 1025   ALKPHOS 69  04/25/2017 1041   AST 38 09/30/2019 1025   AST 23 04/25/2017 1041   ALT 53 (H) 09/30/2019 1025   ALT 14 04/25/2017 1041   BILITOT 1.4 (H) 09/30/2019 1025   BILITOT 0.95 04/25/2017 1041

## 2019-09-30 NOTE — Assessment & Plan Note (Signed)
She has profound weight loss since last time I saw her Clinically, she appears to be cachectic but according to the patient, she is eating well As above, I plan to order CT imaging to evaluate and rule out malignancy as a cause of her recent weight loss and abdominal pain

## 2019-10-05 ENCOUNTER — Telehealth: Payer: Self-pay

## 2019-10-05 NOTE — Telephone Encounter (Signed)
She called and left a message to call her.  Called back. She complaining of more pain at the spleen area. Tylenol not helping. She found one of her old Rx bottles, Oxycodone-tylenol10/325 mg every 6 hours prn. Per last office note she can take Rx. Instructed to take 1/2 tablet every 6 hours prn. Beware of drowsiness and constipation. She verbalized understanding. She is still looking for her other Rx pain medication. She hide them and cannot find them.

## 2019-10-08 ENCOUNTER — Ambulatory Visit: Payer: Medicare PPO | Admitting: Hematology and Oncology

## 2019-10-09 ENCOUNTER — Other Ambulatory Visit: Payer: Self-pay

## 2019-10-09 ENCOUNTER — Ambulatory Visit (HOSPITAL_COMMUNITY)
Admission: RE | Admit: 2019-10-09 | Discharge: 2019-10-09 | Disposition: A | Discharge status: Home / Self Care | Payer: Medicare PPO | Source: Ambulatory Visit | Attending: Hematology and Oncology | Admitting: Hematology and Oncology

## 2019-10-09 DIAGNOSIS — C9110 Chronic lymphocytic leukemia of B-cell type not having achieved remission: Secondary | ICD-10-CM | POA: Diagnosis not present

## 2019-10-09 DIAGNOSIS — R109 Unspecified abdominal pain: Secondary | ICD-10-CM | POA: Diagnosis not present

## 2019-10-09 MED ORDER — SODIUM CHLORIDE (PF) 0.9 % IJ SOLN
INTRAMUSCULAR | Status: AC
  Filled 2019-10-09: qty 50

## 2019-10-09 MED ORDER — IOHEXOL 300 MG/ML  SOLN
100.0000 mL | Freq: Once | INTRAMUSCULAR | Status: AC | PRN
  Administered 2019-10-09: 100 mL via INTRAVENOUS

## 2019-10-12 ENCOUNTER — Inpatient Hospital Stay: Payer: Medicare PPO | Admitting: Hematology and Oncology

## 2019-10-12 ENCOUNTER — Other Ambulatory Visit: Payer: Self-pay

## 2019-10-12 DIAGNOSIS — Z79899 Other long term (current) drug therapy: Secondary | ICD-10-CM | POA: Diagnosis not present

## 2019-10-12 DIAGNOSIS — K5909 Other constipation: Secondary | ICD-10-CM | POA: Diagnosis not present

## 2019-10-12 DIAGNOSIS — N3289 Other specified disorders of bladder: Secondary | ICD-10-CM

## 2019-10-12 DIAGNOSIS — C9110 Chronic lymphocytic leukemia of B-cell type not having achieved remission: Secondary | ICD-10-CM | POA: Diagnosis not present

## 2019-10-12 DIAGNOSIS — D696 Thrombocytopenia, unspecified: Secondary | ICD-10-CM | POA: Diagnosis not present

## 2019-10-12 DIAGNOSIS — I700 Atherosclerosis of aorta: Secondary | ICD-10-CM | POA: Diagnosis not present

## 2019-10-12 DIAGNOSIS — R634 Abnormal weight loss: Secondary | ICD-10-CM | POA: Diagnosis not present

## 2019-10-12 DIAGNOSIS — R748 Abnormal levels of other serum enzymes: Secondary | ICD-10-CM | POA: Diagnosis not present

## 2019-10-13 ENCOUNTER — Encounter: Payer: Self-pay | Admitting: Hematology and Oncology

## 2019-10-13 DIAGNOSIS — N3289 Other specified disorders of bladder: Secondary | ICD-10-CM | POA: Insufficient documentation

## 2019-10-13 NOTE — Assessment & Plan Note (Signed)
She has significant bladder distention on exam The cause is unknown I recommend urology consult

## 2019-10-13 NOTE — Assessment & Plan Note (Signed)
Her CT scan did not show any clear evidence or major findings that could cause of pain I suspect her pain is secondary to severe constipation noted I wonder if her constipation is related to her urinary distention Recommend aggressive laxative therapy I do not recommend her to resume pain medicine as it can worsen constipation

## 2019-10-13 NOTE — Assessment & Plan Note (Signed)
I have reviewed CT imaging studies with the patient and her husband She have no signs of active disease From the CLL standpoint, I will see her back next year for further follow-up

## 2019-10-13 NOTE — Progress Notes (Signed)
[facility] OFF[facility] PROGRESS NOTE  Patient Care Team: Isaac Bliss, Rayford Halsted, MD as PCP - General (Internal Medicine) Julianne Handler, NP as Nurse Practitioner (Hospice and Palliative Medicine)  ASSESSMENT & PLAN:  CLL (chronic lymphocytic leukemia) ([place]) I have reviewed CT imaging studies with the patient and her husband She have no signs of active disease From the CLL standpoint, I will see her back next year for further follow-up  Bladder distension She has significant bladder distention on exam The cause is unknown I recommend urology consult  Other constipation Her CT scan did not show any clear evidence or major findings that could cause of pain I suspect her pain is secondary to severe constipation noted I wonder if her constipation is related to her urinary distention Recommend aggressive laxative therapy I do not recommend her to resume pain medicine as it can worsen constipation   Orders Placed This Encounter  Procedures  . Ambulatory referral to Urology    Referral Priority:   Routine    Referral Type:   Consultation    Referral Reason:   Specialty Services Required    Requested Specialty:   Urology    Number of Visits Requested:   1    All questions were answered. The patient knows to call the clinic with any problems, questions or concerns. The total time spent in the appointment was 20 minutes encounter with patients including review of chart and various tests results, discussions about plan of care and coordination of care plan   Heath Lark, MD 10/13/2019 5:58 PM  INTERVAL HISTORY: Please see below for problem oriented charting. She returns with her husband for further follow-up She denies urinary problems She knows that she is somewhat constipated but she take laxative periodically She continues to have abdominal pain  SUMMARY OF ONCOLOGIC HISTORY: Oncology History Overview Note  Del 13 q   CLL (chronic lymphocytic leukemia) ([place])   03/15/2009 Pathology Results   Case #: [id number]  flow cytometry of peripheral blood comfirmed CLL.   04/25/2017 Pathology Results   FISH panel is positive for deletion 13 q only   04/22/2019 Imaging   Ct scan of the chest, abdomen and pelvis 1. No adenopathy within the chest, abdomen or pelvis. 2. Splenomegaly with scattered low-density lesions measuring up to 1.3 cm. 3. Small subpleural nodule in the left lower lobe and right middle lobe measuring up to 5 mm. No follow-up needed if patient is low-risk (and has no known or suspected primary neoplasm). Non-contrast chest CT can be considered in 12 months if patient is high-risk. This recommendation follows the consensus statement: Guidelines for Management of Incidental Pulmonary Nodules Detected on CT Images: From the Fleischner Society 2017; Radiology 2017; 284:228-243. 4. Aortic Atherosclerosis (ICD10-I70.0). Coronary artery atherosclerotic calcifications.     04/23/2019 Cancer Staging   Staging form: Chronic Lymphocytic Leukemia / Small Lymphocytic Lymphoma, AJCC 8th Edition - Clinical stage from 04/23/2019: Modified Rai Stage IV (Modified Rai risk: High, Lymphocytosis: Present, Adenopathy: Absent, Organomegaly: Present, Anemia: Absent, Thrombocytopenia: Present) - Signed by Heath Lark, MD on 04/23/2019   04/28/2019 -  Chemotherapy   The patient had Ibrutinib for chemotherapy treatment.     06/01/2019 -  Chemotherapy   The patient had Bendamustine for chemotherapy   06/02/2019 Imaging   Ct head, chest, abdomen and pelvis 1. New T8 compression deformity with approximately 30% height loss. Worsening anterior compression deformity of the T11 vertebrae now with 20% height loss anteriorly. Additional T12 and L2  compression deformities are stable from prior. 2. No other acute traumatic injury within the chest, abdomen, or pelvis. Specifically, no splenic injury or retroperitoneal hematoma. 3. Mild intra and extrahepatic biliary ductal dilatation  with a small calcified gallstone seen in the distal common bile duct just proximal to the ampulla of Vater. Correlate with LFTs and consider further evaluation with MRCP as clinically indicated. 4. Stable sub 5 mm nodules in the right middle and left lower lobe. No further evaluation is required if patient is low risk however should continue with previously recommended 12 month follow-up in September of 2021 if patient is high risk. 5. Severe scoliotic curvature of the thoracolumbar spine with associated chest wall deformity. 6. Duodenal and colonic diverticula without features of diverticulitis. 7. Moderate bladder distention, correlate for features of outlet obstruction or retention. 8. Aortic Atherosclerosis (ICD10-I70.0).   06/07/2019 - 06/10/2019 Hospital Admission   She was admitted for management of LUQ and back pain   06/08/2019 Imaging   MRI abdomen 1. Mild intra and extrahepatic biliary duct prominence with common bile duct measuring upper normal for patient age. No gallstones evident and no definite choledocholithiasis. Somewhat abrupt transition of the duct into the ampulla noted, but nonspecific. ERCP could be used to further evaluate for ampullary lesion as clinically warranted. 2. Hepatic and renal cysts. 3. Presumed marked distention of the urinary bladder.   06/08/2019 Imaging   Ct angiogram No gross evidence of large or central pulmonary emboli is identified on exam limited secondary to suboptimal pulmonary arterial opacification and respiratory motion.   Bibasilar atelectasis.   Osseous demineralization with compression fractures of several thoracic vertebra.   Aneurysmal dilatation ascending thoracic aorta 4.1 cm transverse, recommendation below.   Recommend annual imaging followup by CTA or MRA.   Aortic Atherosclerosis (ICD10-I70.0).   Aortic aneurysm NOS   10/09/2019 Imaging   1. No definite acute CT findings of the abdomen or pelvis to explain pain. 2. Very  distended urinary bladder.  Correlate for urinary retention. 3. Large burden of stool in the distal colon and rectum. Correlate for constipation. 4. Unchanged mild intrahepatic biliary ductal dilatation. Common bile duct normal in caliber measuring up to 6 mm. No obstructing lesion or calculi identified. 5. No mass or lymphadenopathy in the abdomen or pelvis. 6. Aortic Atherosclerosis (ICD10-I70.0).     REVIEW OF SYSTEMS:   Constitutional: Denies fevers, chills or abnormal weight loss Eyes: Denies blurriness of vision Ears, nose, mouth, throat, and face: Denies mucositis or sore throat Respiratory: Denies cough, dyspnea or wheezes Cardiovascular: Denies palpitation, chest discomfort or lower extremity swelling Skin: Denies abnormal skin rashes Lymphatics: Denies new lymphadenopathy or easy bruising Neurological:Denies numbness, tingling or new weaknesses Behavioral/Psych: Mood is stable, no new changes  All other systems were reviewed with the patient and are negative.  I have reviewed the past medical history, past surgical history, social history and family history with the patient and they are unchanged from previous note.  ALLERGIES:  is allergic to codeine and levofloxacin.  MEDICATIONS:  Current Outpatient Medications  Medication Sig Dispense Refill  . [employer] ACID CONTROLLER 10 MG tablet TAKE 1 TABLET BY MOUTH EVERYDAY AT BEDTIME (Patient not taking: Reported on 09/17/2019) 30 tablet 0  . acetaminophen (TYLENOL) 500 MG tablet Take 2 tablets (1,000 mg total) by mouth every 8 (eight) hours. (Patient taking differently: Take 1,000 mg by mouth every 8 (eight) hours as needed for moderate pain. ) 180 tablet 11  . augmented betamethasone dipropionate (DIPROLENE-AF) 0.05 %  cream Apply 1 application topically 2 (two) times daily as needed (flare ups).   2  . bisacodyl (DULCOLAX) 10 MG suppository Place 1 suppository (10 mg total) rectally as needed for moderate constipation. 12 suppository 0   . omeprazole (PRILOSEC) 20 MG capsule Take 20 mg by mouth daily.    . ondansetron (ZOFRAN) 4 MG tablet Take 4 mg by mouth every 8 (eight) hours as needed for nausea or vomiting.    . polyethylene glycol (MIRALAX / GLYCOLAX) 17 g packet Take 17 g 1-3 times a day as needed for constipation 42 each 0  . prochlorperazine (COMPAZINE) 5 MG tablet Take 5 mg by mouth every 6 (six) hours as needed for nausea or vomiting.     . senna-docusate (SENOKOT-S) 8.6-50 MG tablet Take 1 tablet 1-2 times a day as needed for constipation 90 tablet 0  . sucralfate (CARAFATE) 1 GM/10ML suspension 1 GRAM 2X DAILY BEFORE MEALS AS NEEDED (Patient not taking: Reported on 09/28/2019) 420 mL 0   No current facility-administered medications for this visit.    PHYSICAL EXAMINATION: ECOG PERFORMANCE STATUS: 1 - Symptomatic but completely ambulatory  Vitals:   10/12/19 1246  BP: 136/80  Pulse: 95  Resp: 18  Temp: 98.5 F (36.9 C)  SpO2: 99%   Filed Weights   10/12/19 1246  Weight: 92 lb 9.6 oz (42 kg)    GENERAL:alert, no distress and comfortable.  She looks thin and cachectic NEURO: alert & oriented x 3 with fluent speech, no focal motor/sensory deficits  LABORATORY DATA:  I have reviewed the data as listed    Component Value Date/Time   NA 141 09/30/2019 1025   NA 141 04/25/2017 1041   K 4.1 09/30/2019 1025   K 4.6 04/25/2017 1041   CL 107 09/30/2019 1025   CL 104 10/27/2012 1209   CO2 26 09/30/2019 1025   CO2 26 04/25/2017 1041   GLUCOSE 102 (H) 09/30/2019 1025   GLUCOSE 97 04/25/2017 1041   GLUCOSE 104 (H) 10/27/2012 1209   BUN 12 09/30/2019 1025   BUN 13.1 04/25/2017 1041   CREATININE 0.71 09/30/2019 1025   CREATININE 0.8 04/25/2017 1041   CALCIUM 9.6 09/30/2019 1025   CALCIUM 9.8 04/25/2017 1041   PROT 6.7 09/30/2019 1025   PROT 7.5 04/25/2017 1041   ALBUMIN 4.1 09/30/2019 1025   ALBUMIN 4.4 04/25/2017 1041   AST 38 09/30/2019 1025   AST 23 04/25/2017 1041   ALT 53 (H) 09/30/2019 1025    ALT 14 04/25/2017 1041   ALKPHOS 173 (H) 09/30/2019 1025   ALKPHOS 69 04/25/2017 1041   BILITOT 1.4 (H) 09/30/2019 1025   BILITOT 0.95 04/25/2017 1041   GFRNONAA >60 09/30/2019 1025   GFRAA >60 09/30/2019 1025    No results found for: SPEP, UPEP  Lab Results  Component Value Date   WBC 7.2 09/30/2019   NEUTROABS 3.2 09/30/2019   HGB 13.1 09/30/2019   HCT 40.0 09/30/2019   MCV 100.3 (H) 09/30/2019   PLT 147 (L) 09/30/2019      Chemistry      Component Value Date/Time   NA 141 09/30/2019 1025   NA 141 04/25/2017 1041   K 4.1 09/30/2019 1025   K 4.6 04/25/2017 1041   CL 107 09/30/2019 1025   CL 104 10/27/2012 1209   CO2 26 09/30/2019 1025   CO2 26 04/25/2017 1041   BUN 12 09/30/2019 1025   BUN 13.1 04/25/2017 1041   CREATININE 0.71 09/30/2019 1025  CREATININE 0.8 04/25/2017 1041      Component Value Date/Time   CALCIUM 9.6 09/30/2019 1025   CALCIUM 9.8 04/25/2017 1041   ALKPHOS 173 (H) 09/30/2019 1025   ALKPHOS 69 04/25/2017 1041   AST 38 09/30/2019 1025   AST 23 04/25/2017 1041   ALT 53 (H) 09/30/2019 1025   ALT 14 04/25/2017 1041   BILITOT 1.4 (H) 09/30/2019 1025   BILITOT 0.95 04/25/2017 1041       RADIOGRAPHIC STUDIES: I have reviewed imaging studies with the patient and her husband I have personally reviewed the radiological images as listed and agreed with the findings in the report. CT ABDOMEN PELVIS W CONTRAST  Result Date: 10/09/2019 CLINICAL DATA:  Severe abdominal pain, history of CLL, history of partial colectomy for diverticulitis EXAM: CT ABDOMEN AND PELVIS WITH CONTRAST TECHNIQUE: Multidetector CT imaging of the abdomen and pelvis was performed using the standard protocol following bolus administration of intravenous contrast. CONTRAST:  145mL OMNIPAQUE IOHEXOL 300 MG/ML SOLN, additional oral enteric contrast COMPARISON:  06/02/2019 FINDINGS: Lower chest: Bibasilar scarring. Hepatobiliary: No solid liver abnormality is seen. No gallstones or  gallbladder wall thickening. Unchanged mild intrahepatic biliary ductal dilatation. Common bile duct normal in caliber measuring up to 6 mm. Pancreas: Unremarkable. No pancreatic ductal dilatation or surrounding inflammatory changes. Spleen: Normal in size without significant abnormality. Unchanged subtle low-attenuation lesions of the central spleen, likely cysts or hemangiomata (series 5, image 49) Adrenals/Urinary Tract: Adrenal glands are unremarkable. Multiple parapelvic cysts. Kidneys are otherwise normal, without renal calculi, solid lesion, or hydronephrosis. Distended urinary bladder. Stomach/Bowel: Stomach is within normal limits. Appendix is not clearly visualized. No evidence of bowel wall thickening, distention, or inflammatory changes. Large burden of stool in the distal colon and rectum. Vascular/Lymphatic: Aortic atherosclerosis. No enlarged abdominal or pelvic lymph nodes. Reproductive: Status post hysterectomy. Other: No abdominal wall hernia or abnormality. No abdominopelvic ascites. Musculoskeletal: No acute or significant osseous findings. Thoracolumbar scoliosis. IMPRESSION: 1. No definite acute CT findings of the abdomen or pelvis to explain pain. 2. Very distended urinary bladder.  Correlate for urinary retention. 3. Large burden of stool in the distal colon and rectum. Correlate for constipation. 4. Unchanged mild intrahepatic biliary ductal dilatation. Common bile duct normal in caliber measuring up to 6 mm. No obstructing lesion or calculi identified. 5. No mass or lymphadenopathy in the abdomen or pelvis. 6. Aortic Atherosclerosis (ICD10-I70.0). Electronically Signed   By: Eddie Candle M.D.   On: 10/09/2019 15:19

## 2019-10-14 ENCOUNTER — Telehealth: Payer: Self-pay | Admitting: Hematology and Oncology

## 2019-10-14 NOTE — Telephone Encounter (Signed)
Scheduled per 3/16 sch msg. Called and spoke with pt, confirmed 3/17 appt

## 2019-10-16 ENCOUNTER — Telehealth: Payer: Self-pay

## 2019-10-16 NOTE — Telephone Encounter (Signed)
She called regarding referral to [facility]. Faxed referral to [phone] at [facility] and called and scheduled appt with Dr. Claudia Desanctis on 4/9 at 1:30. April [name] given appt date/ time. Instructed to call [facility] for worsening concerns or go to ER. She verbalized understanding.

## 2019-10-27 DIAGNOSIS — R338 Other retention of urine: Secondary | ICD-10-CM | POA: Diagnosis not present

## 2019-10-27 DIAGNOSIS — R339 Retention of urine, unspecified: Secondary | ICD-10-CM | POA: Diagnosis not present

## 2019-10-29 DIAGNOSIS — R339 Retention of urine, unspecified: Secondary | ICD-10-CM | POA: Diagnosis not present

## 2019-11-03 DIAGNOSIS — R102 Pelvic and perineal pain: Secondary | ICD-10-CM | POA: Diagnosis not present

## 2019-11-03 DIAGNOSIS — R1012 Left upper quadrant pain: Secondary | ICD-10-CM | POA: Diagnosis not present

## 2019-11-03 DIAGNOSIS — R339 Retention of urine, unspecified: Secondary | ICD-10-CM | POA: Diagnosis not present

## 2019-11-03 DIAGNOSIS — R1032 Left lower quadrant pain: Secondary | ICD-10-CM | POA: Diagnosis not present

## 2019-11-17 DIAGNOSIS — R339 Retention of urine, unspecified: Secondary | ICD-10-CM | POA: Diagnosis not present

## 2019-11-17 DIAGNOSIS — R1012 Left upper quadrant pain: Secondary | ICD-10-CM | POA: Diagnosis not present

## 2019-11-20 DIAGNOSIS — R1032 Left lower quadrant pain: Secondary | ICD-10-CM | POA: Diagnosis not present

## 2019-11-20 DIAGNOSIS — R1012 Left upper quadrant pain: Secondary | ICD-10-CM | POA: Diagnosis not present

## 2019-11-20 DIAGNOSIS — R339 Retention of urine, unspecified: Secondary | ICD-10-CM | POA: Diagnosis not present

## 2019-11-20 DIAGNOSIS — M62838 Other muscle spasm: Secondary | ICD-10-CM | POA: Diagnosis not present

## 2019-12-01 DIAGNOSIS — N3000 Acute cystitis without hematuria: Secondary | ICD-10-CM | POA: Diagnosis not present

## 2019-12-01 DIAGNOSIS — R8271 Bacteriuria: Secondary | ICD-10-CM | POA: Diagnosis not present

## 2019-12-01 DIAGNOSIS — R1032 Left lower quadrant pain: Secondary | ICD-10-CM | POA: Diagnosis not present

## 2019-12-01 DIAGNOSIS — R338 Other retention of urine: Secondary | ICD-10-CM | POA: Diagnosis not present

## 2019-12-02 DIAGNOSIS — Z87891 Personal history of nicotine dependence: Secondary | ICD-10-CM | POA: Diagnosis not present

## 2019-12-02 DIAGNOSIS — Z809 Family history of malignant neoplasm, unspecified: Secondary | ICD-10-CM | POA: Diagnosis not present

## 2019-12-02 DIAGNOSIS — R030 Elevated blood-pressure reading, without diagnosis of hypertension: Secondary | ICD-10-CM | POA: Diagnosis not present

## 2019-12-02 DIAGNOSIS — Z853 Personal history of malignant neoplasm of breast: Secondary | ICD-10-CM | POA: Diagnosis not present

## 2019-12-02 DIAGNOSIS — R636 Underweight: Secondary | ICD-10-CM | POA: Diagnosis not present

## 2019-12-02 DIAGNOSIS — C8590 Non-Hodgkin lymphoma, unspecified, unspecified site: Secondary | ICD-10-CM | POA: Diagnosis not present

## 2019-12-02 DIAGNOSIS — Z85828 Personal history of other malignant neoplasm of skin: Secondary | ICD-10-CM | POA: Diagnosis not present

## 2019-12-02 DIAGNOSIS — Z681 Body mass index (BMI) 19 or less, adult: Secondary | ICD-10-CM | POA: Diagnosis not present

## 2019-12-02 DIAGNOSIS — Z8249 Family history of ischemic heart disease and other diseases of the circulatory system: Secondary | ICD-10-CM | POA: Diagnosis not present

## 2019-12-03 ENCOUNTER — Encounter (HOSPITAL_COMMUNITY): Payer: Self-pay | Admitting: Emergency Medicine

## 2019-12-03 ENCOUNTER — Emergency Department (HOSPITAL_COMMUNITY)
Admission: EM | Admit: 2019-12-03 | Discharge: 2019-12-03 | Disposition: A | Discharge status: Home / Self Care | Payer: Medicare PPO | Attending: Emergency Medicine | Admitting: Emergency Medicine

## 2019-12-03 ENCOUNTER — Emergency Department (HOSPITAL_COMMUNITY): Payer: Medicare PPO

## 2019-12-03 ENCOUNTER — Other Ambulatory Visit: Payer: Self-pay

## 2019-12-03 DIAGNOSIS — N390 Urinary tract infection, site not specified: Secondary | ICD-10-CM | POA: Diagnosis not present

## 2019-12-03 DIAGNOSIS — N281 Cyst of kidney, acquired: Secondary | ICD-10-CM | POA: Diagnosis not present

## 2019-12-03 DIAGNOSIS — R1032 Left lower quadrant pain: Secondary | ICD-10-CM | POA: Diagnosis present

## 2019-12-03 DIAGNOSIS — N3289 Other specified disorders of bladder: Secondary | ICD-10-CM | POA: Diagnosis not present

## 2019-12-03 LAB — BASIC METABOLIC PANEL
Anion gap: 9 (ref 5–15)
BUN: 10 mg/dL (ref 8–23)
CO2: 24 mmol/L (ref 22–32)
Calcium: 9.4 mg/dL (ref 8.9–10.3)
Chloride: 104 mmol/L (ref 98–111)
Creatinine, Ser: 0.54 mg/dL (ref 0.44–1.00)
GFR calc Af Amer: >60 mL/min (ref >60)
GFR calc non Af Amer: >60 mL/min (ref >60)
Glucose, Bld: 110 mg/dL — ABNORMAL HIGH (ref 70–99)
Potassium: 4 mmol/L (ref 3.5–5.1)
Sodium: 137 mmol/L (ref 135–145)

## 2019-12-03 LAB — CBC
HCT: 45.3 % (ref 36.0–46.0)
Hemoglobin: 14.9 g/dL (ref 12.0–15.0)
MCH: 33.8 pg (ref 26.0–34.0)
MCHC: 32.9 g/dL (ref 30.0–36.0)
MCV: 102.7 fL — ABNORMAL HIGH (ref 80.0–100.0)
Platelets: 156 10*3/uL (ref 150–400)
RBC: 4.41 MIL/uL (ref 3.87–5.11)
RDW: 13.2 % (ref 11.5–15.5)
WBC: 8 10*3/uL (ref 4.0–10.5)
nRBC: 0 % (ref 0.0–0.2)

## 2019-12-03 LAB — URINALYSIS, ROUTINE W REFLEX MICROSCOPIC
Bilirubin Urine: NEGATIVE
Glucose, UA: NEGATIVE mg/dL
Ketones, ur: NEGATIVE mg/dL
Leukocytes,Ua: NEGATIVE
Nitrite: NEGATIVE
Protein, ur: NEGATIVE mg/dL
Specific Gravity, Urine: 1.014 (ref 1.005–1.030)
pH: 5 (ref 5.0–8.0)

## 2019-12-03 MED ORDER — IOHEXOL 300 MG/ML  SOLN
80.0000 mL | Freq: Once | INTRAMUSCULAR | Status: AC | PRN
  Administered 2019-12-03: 13:00:00 80 mL via INTRAVENOUS

## 2019-12-03 MED ORDER — SULFAMETHOXAZOLE-TRIMETHOPRIM 800-160 MG PO TABS: 1.0000 | ORAL_TABLET | Freq: Two times a day (BID) | ORAL | 0 refills | Status: AC

## 2019-12-03 MED ORDER — SODIUM CHLORIDE 0.9 % IV SOLN: INTRAVENOUS | Status: DC

## 2019-12-03 MED ORDER — SODIUM CHLORIDE (PF) 0.9 % IJ SOLN
INTRAMUSCULAR | Status: AC
  Filled 2019-12-03: qty 50

## 2019-12-03 MED ORDER — MORPHINE SULFATE (PF) 2 MG/ML IV SOLN
2.0000 mg | Freq: Once | INTRAVENOUS | Status: AC
  Administered 2019-12-03: 13:00:00 2 mg via INTRAVENOUS
  Filled 2019-12-03: qty 1

## 2019-12-03 NOTE — ED Provider Notes (Signed)
[facility] DEPT Provider Note   CSN: [id number] Arrival date & time: 12/03/19  1005     History Chief Complaint  Patient presents with  . Abdominal Pain    April [name] is a 85 y.o. female.  85 year old female presents with 1 month of left lower quadrant abdominal pain. History of UTI in the past and is followed by Dr. Claudia Desanctis with urology. Denies any urinary symptoms at this time. States the pain is been persistent for about a month or worse over the last day or so. Denies any fever or chills. No vomiting or diarrhea with it pain is worse with movement. Unrelieved with her home medications. Called her urologist and told to come here for further evaluation.        Past Medical History:  Diagnosis Date  . Breast CA ([place])   . Bronchitis, chronic ([place])   . Eye hemorrhage, left   . Leukemia, chronic lymphoid ([place])   . 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 Osteoporosis 05/2018   T score -3.1 overall stable from prior study    Patient Active Problem List   Diagnosis Date Noted  . Bladder distension 10/13/2019  . Weight loss, abnormal 09/30/2019  . Acute metabolic encephalopathy [id number]  . Other constipation 06/18/2019  . Physical debility 06/18/2019  . Abdominal distension 06/08/2019  . Severe pain 06/08/2019  . Intractable back pain 06/08/2019  . Closed wedge compression fracture of T8 vertebra ([place]) 06/07/2019  . Sinus tachycardia 06/07/2019  . Intractable pain 06/07/2019  . Goals of care, counseling/discussion 05/14/2019  . Abnormal liver enzymes 05/14/2019  . Dehydration 05/14/2019  . Failure to thrive in adult 05/14/2019  . Abdominal pain 04/16/2019  . Cancer associated pain 04/16/2019  . Breast cancer ([place]) 06/19/2018  . Skin lesion 05/06/2017  . Essential hypertension 12/14/2016  . Systolic hypertension, isolated 11/16/2016  . Central retinal vein occlusion 10/18/2016  . Preventive measure 04/28/2015    . Thrombocytopenia ([place]) 04/28/2015  . Idiopathic scoliosis 10/23/2012  . CLL (chronic lymphocytic leukemia) ([place]) 03/02/2009  . VENOUS INSUFFICIENCY, CHRONIC 10/28/2008  . Dyslipidemia 10/27/2007  . Osteoporosis 10/27/2007  . SKIN CANCER, HX OF 10/27/2007  . DIVERTICULITIS, HX OF 04/08/2007    Past Surgical History:  Procedure Laterality Date  . ABDOMINAL HYSTERECTOMY  1980  . BREAST IMPLANTS REMOVED  2001  . BREAST SURGERY     Bilateral mastectomy  . IR RADIOLOGIST EVAL & MGMT  07/01/2019  . MASTECTOMY  1982   BILATERAL  . OOPHORECTOMY     BSO  . PARTIAL COLECTOMY     INTESTINAL ABSCESS WITH SALPINGECTOMY  . RECONSTRUCTION LEFT ELBOW    . TONSILLECTOMY    . UMBILLICAL HERNIA REPAIR       OB History    Gravida  0   Para  0   Term      Preterm      AB      Living        SAB      TAB      Ectopic      Multiple      Live Births              Family History  Problem Relation Age of Onset  . Cancer Brother        HODGKINS  . Cancer Brother        leukemia  . Parkinson's disease Brother   . Heart disease  Mother   . Heart disease Sister     Social History   Tobacco Use  . Smoking status: Former Smoker    Types: Cigarettes    Quit date: 03/26/1955    Years since quitting: 64.7  . Smokeless tobacco: Never Used  Substance Use Topics  . Alcohol use: Never  . Drug use: No    Home Medications Prior to Admission medications   Medication Sig Start Date End Date Taking? Authorizing Provider  [employer] ACID CONTROLLER 10 MG tablet TAKE 1 TABLET BY MOUTH EVERYDAY AT BEDTIME Patient not taking: Reported on 09/17/2019 08/27/19   Isaac Bliss, Rayford Halsted, MD  acetaminophen (TYLENOL) 500 MG tablet Take 2 tablets (1,000 mg total) by mouth every 8 (eight) hours. Patient taking differently: Take 1,000 mg by mouth every 8 (eight) hours as needed for moderate pain.  06/10/19 06/09/20  Mercy Riding, MD  augmented betamethasone dipropionate (DIPROLENE-AF) 0.05 %  cream Apply 1 application topically 2 (two) times daily as needed (flare ups).  05/08/18   [provider]  bisacodyl (DULCOLAX) 10 MG suppository Place 1 suppository (10 mg total) rectally as needed for moderate constipation. 06/18/19   Heath Lark, MD  omeprazole (PRILOSEC) 20 MG capsule Take 20 mg by mouth daily. 07/20/19   [provider]  ondansetron (ZOFRAN) 4 MG tablet Take 4 mg by mouth every 8 (eight) hours as needed for nausea or vomiting.    [provider]  polyethylene glycol (MIRALAX / GLYCOLAX) 17 g packet Take 17 g 1-3 times a day as needed for constipation 06/10/19   Wendee Beavers T, MD  prochlorperazine (COMPAZINE) 5 MG tablet Take 5 mg by mouth every 6 (six) hours as needed for nausea or vomiting.     [provider]  senna-docusate (SENOKOT-S) 8.6-50 MG tablet Take 1 tablet 1-2 times a day as needed for constipation 06/10/19   Wendee Beavers T, MD  sucralfate (CARAFATE) 1 GM/10ML suspension 1 GRAM 2X DAILY BEFORE MEALS AS NEEDED Patient not taking: Reported on 09/28/2019 08/27/19   Isaac Bliss, Rayford Halsted, MD    Allergies    Codeine and Levofloxacin  Review of Systems   Review of Systems  All other systems reviewed and are negative.   Physical Exam Updated Vital Signs BP (!) 179/85 (BP Location: Left Arm)   Pulse 71   Temp 98 F (36.7 C) (Oral)   Resp 16   LMP 03/26/1979   SpO2 100%   Physical Exam Vitals and nursing note reviewed.  Constitutional:      General: She is not in acute distress.    Appearance: Normal appearance. She is well-developed. She is not toxic-appearing.  HENT:     Head: Normocephalic and atraumatic.  Eyes:     General: Lids are normal.     Conjunctiva/sclera: Conjunctivae normal.     Pupils: Pupils are equal, round, and reactive to light.  Neck:     Thyroid: No thyroid mass.     Trachea: No tracheal deviation.  Cardiovascular:     Rate and Rhythm: Normal rate and regular rhythm.     Heart sounds:  Normal heart sounds. No murmur. No gallop.   Pulmonary:     Effort: Pulmonary effort is normal. No respiratory distress.     Breath sounds: Normal breath sounds. No stridor. No decreased breath sounds, wheezing, rhonchi or rales.  Abdominal:     General: Bowel sounds are normal. There is no distension.     Palpations: Abdomen is  soft.     Tenderness: There is abdominal tenderness in the left lower quadrant. There is no rebound.    Musculoskeletal:        General: No tenderness. Normal range of motion.     Cervical back: Normal range of motion and neck supple.  Skin:    General: Skin is warm and dry.     Findings: No abrasion or rash.  Neurological:     Mental Status: She is alert and oriented to person, place, and time.     GCS: GCS eye subscore is 4. GCS verbal subscore is 5. GCS motor subscore is 6.     Cranial Nerves: No cranial nerve deficit.     Sensory: No sensory deficit.  Psychiatric:        Speech: Speech normal.        Behavior: Behavior normal.     ED Results / Procedures / Treatments   Labs (all labs ordered are listed, but only abnormal results are displayed) Labs Reviewed  URINALYSIS, [place] PANEL  CBC    EKG None  Radiology No results found.  Procedures Procedures (including critical care time)  Medications Ordered in ED Medications  0.9 %  sodium chloride infusion (has no administration in time range)  morphine 2 MG/ML injection 2 mg (has no administration in time range)    ED Course  I have reviewed the triage vital signs and the nursing notes.  Pertinent labs & imaging results that were available during my care of the patient were reviewed by me and considered in my medical decision making (see chart for details).    MDM Rules/Calculators/A&P                      Patient's labs and urinalysis reviewed and without severe acute findings.  Discussed with patient's urologist, Dr. Claudia Desanctis, who states that  patient is urine culture did come back positive for Enterobacter and sensitive to Cipro, levofloxacin, Bactrim.  Patient's abdominal CT consistent with cystitis.  Will place patient antibiotics and have her follow-up with her doctor Final Clinical Impression(s) / ED Diagnoses Final diagnoses:  None    Rx / DC Orders ED Discharge Orders    None       Lacretia Leigh, MD 12/03/19 1346

## 2019-12-03 NOTE — ED Triage Notes (Signed)
Per pt, states she is having LLQ pain since Monday-is being treated currently for a UTI-patient is on her 3rd antibiotic-states she has been self-catheterizing for 3 weeks-states she received treatment for her CLL on that side so her oncologist did CT and referred her to urology-states Dr Claudia Desanctis referred her here for pain control stating her pain does not coincide with symptoms of UTI

## 2019-12-07 DIAGNOSIS — N3000 Acute cystitis without hematuria: Secondary | ICD-10-CM | POA: Diagnosis not present

## 2019-12-07 DIAGNOSIS — R338 Other retention of urine: Secondary | ICD-10-CM | POA: Diagnosis not present

## 2019-12-10 DIAGNOSIS — N3000 Acute cystitis without hematuria: Secondary | ICD-10-CM | POA: Diagnosis not present

## 2019-12-10 DIAGNOSIS — R338 Other retention of urine: Secondary | ICD-10-CM | POA: Diagnosis not present

## 2019-12-10 DIAGNOSIS — R102 Pelvic and perineal pain: Secondary | ICD-10-CM | POA: Diagnosis not present

## 2019-12-14 DIAGNOSIS — H34812 Central retinal vein occlusion, left eye, with macular edema: Secondary | ICD-10-CM | POA: Diagnosis not present

## 2019-12-14 DIAGNOSIS — H35033 Hypertensive retinopathy, bilateral: Secondary | ICD-10-CM | POA: Diagnosis not present

## 2019-12-14 DIAGNOSIS — Z961 Presence of intraocular lens: Secondary | ICD-10-CM | POA: Diagnosis not present

## 2019-12-14 DIAGNOSIS — H35371 Puckering of macula, right eye: Secondary | ICD-10-CM | POA: Diagnosis not present

## 2019-12-15 DIAGNOSIS — R300 Dysuria: Secondary | ICD-10-CM | POA: Diagnosis not present

## 2019-12-15 DIAGNOSIS — R339 Retention of urine, unspecified: Secondary | ICD-10-CM | POA: Diagnosis not present

## 2019-12-18 ENCOUNTER — Telehealth: Payer: Self-pay

## 2019-12-18 NOTE — Telephone Encounter (Signed)
She needs to see her PCP for chronic UTI/pain management I have done blood work and CT scan which showed no evidence of CLL/disease so I cannot help her

## 2019-12-18 NOTE — Telephone Encounter (Signed)
Called and given below message. She verbalized understanding. 

## 2019-12-18 NOTE — Telephone Encounter (Signed)
She called and left a message to call her.  Called back. She is requesting appt with Dr. Alvy Bimler. She got a call from the urologist yesterday that she has a UTI and was started on antibiotics. She is still having pain unrelated to UTI on the left side of her abdomen. She takes tylenol for the pain. When the pain is really bad she has a percocet that she takes.

## 2019-12-23 ENCOUNTER — Ambulatory Visit: Payer: Medicare PPO | Admitting: Family Medicine

## 2019-12-23 ENCOUNTER — Encounter: Payer: Self-pay | Admitting: Family Medicine

## 2019-12-23 ENCOUNTER — Other Ambulatory Visit: Payer: Self-pay

## 2019-12-23 ENCOUNTER — Telehealth: Payer: Self-pay | Admitting: Internal Medicine

## 2019-12-23 VITALS — BP 123/81 | HR 70 | Temp 97.7°F | Resp 17 | Wt 89.1 lb

## 2019-12-23 DIAGNOSIS — R1012 Left upper quadrant pain: Secondary | ICD-10-CM

## 2019-12-23 DIAGNOSIS — R1084 Generalized abdominal pain: Secondary | ICD-10-CM | POA: Diagnosis not present

## 2019-12-23 LAB — POCT URINALYSIS DIPSTICK
Blood, UA: NEGATIVE
Glucose, UA: NEGATIVE
Ketones, UA: NEGATIVE
Leukocytes, UA: NEGATIVE
Nitrite, UA: NEGATIVE
Protein, UA: NEGATIVE
Spec Grav, UA: 1.02 (ref 1.010–1.025)
Urobilinogen, UA: 0.2 E.U./dL
pH, UA: 6 (ref 5.0–8.0)

## 2019-12-23 LAB — CBC WITH DIFFERENTIAL/PLATELET
Basophils Absolute: 0 10*3/uL (ref 0.0–0.1)
Basophils Relative: 0.3 % (ref 0.0–3.0)
Eosinophils Absolute: 0.1 10*3/uL (ref 0.0–0.7)
Eosinophils Relative: 0.9 % (ref 0.0–5.0)
HCT: 39.3 % (ref 36.0–46.0)
Hemoglobin: 13.4 g/dL (ref 12.0–15.0)
Lymphocytes Relative: 40.7 % (ref 12.0–46.0)
Lymphs Abs: 2.3 10*3/uL (ref 0.7–4.0)
MCHC: 34.2 g/dL (ref 30.0–36.0)
MCV: 99.8 fl (ref 78.0–100.0)
Monocytes Absolute: 0.4 10*3/uL (ref 0.1–1.0)
Monocytes Relative: 7.7 % (ref 3.0–12.0)
Neutro Abs: 2.8 10*3/uL (ref 1.4–7.7)
Neutrophils Relative %: 50.4 % (ref 43.0–77.0)
Platelets: 128 10*3/uL — ABNORMAL LOW (ref 150.0–400.0)
RBC: 3.93 Mil/uL (ref 3.87–5.11)
RDW: 13.7 % (ref 11.5–15.5)
WBC: 5.7 10*3/uL (ref 4.0–10.5)

## 2019-12-23 LAB — BASIC METABOLIC PANEL
BUN: 13 mg/dL (ref 6–23)
CO2: 27 mEq/L (ref 19–32)
Calcium: 9.4 mg/dL (ref 8.4–10.5)
Chloride: 103 mEq/L (ref 96–112)
Creatinine, Ser: 0.67 mg/dL (ref 0.40–1.20)
GFR: 84.17 mL/min (ref >60.00)
Glucose, Bld: 117 mg/dL — ABNORMAL HIGH (ref 70–99)
Potassium: 4.2 mEq/L (ref 3.5–5.1)
Sodium: 137 mEq/L (ref 135–145)

## 2019-12-23 LAB — HEPATIC FUNCTION PANEL
ALT: 43 U/L — ABNORMAL HIGH (ref 0–35)
AST: 34 U/L (ref 0–37)
Albumin: 4.3 g/dL (ref 3.5–5.2)
Alkaline Phosphatase: 169 U/L — ABNORMAL HIGH (ref 39–117)
Bilirubin, Direct: 0.2 mg/dL (ref 0.0–0.3)
Total Bilirubin: 0.9 mg/dL (ref 0.2–1.2)
Total Protein: 6.4 g/dL (ref 6.0–8.3)

## 2019-12-23 LAB — AMYLASE: Amylase: 82 U/L (ref 27–131)

## 2019-12-23 LAB — LIPASE: Lipase: 46 U/L (ref 11.0–59.0)

## 2019-12-23 MED ORDER — TRAMADOL HCL 50 MG PO TABS: 50.0000 mg | ORAL_TABLET | Freq: Three times a day (TID) | ORAL | 0 refills | Status: AC | PRN

## 2019-12-23 NOTE — Telephone Encounter (Signed)
For your review

## 2019-12-23 NOTE — Telephone Encounter (Signed)
Nurse Assessment Nurse: Laurena Bering, RN, Helene Kelp Date/Time Eilene Ghazi Time): 12/23/2019 9:08:56 AM Confirm and document reason for call. If symptomatic, describe symptoms. ---Caller states that hose has severe abdominal pain all over that rates as 5/10. No vomiting or diarrhea. Pain woke her up. Finished antibiotic for UTI 12/22/2019 Has the patient had close contact with a person known or suspected to have the novel coronavirus illness OR traveled / lives in area with major community spread (including international travel) in the last 14 days from the onset of symptoms? * If Asymptomatic, screen for exposure and travel within the last 14 days. ---No Does the patient have any new or worsening symptoms? ---Yes Will a triage be completed? ---Yes Related visit to physician within the last 2 weeks? ---No Does the PT have any chronic conditions? (i.e. diabetes, asthma, this includes High risk factors for pregnancy, etc.) ---No Is this a behavioral health or substance abuse call? ---No Guidelines Guideline Title Affirmed Question Affirmed Notes Nurse Date/Time Eilene Ghazi Time) Abdominal Pain - Female [1] MILD-MODERATE pain AND [2] constant AND [3] present > 2 hours Laurena Bering, RN, Helene Kelp 12/23/2019 9:11:35 AM Disp. Time Eilene Ghazi Time) Disposition Final User 12/23/2019 9:05:45 AM Send to Urgent Starleen Arms, DawnPLEASE NOTE: All timestamps contained within this report are represented as [place] Standard Time. CONFIDENTIALTY NOTICE: This fax transmission is intended only for the addressee. It contains information that is legally privileged, confidential or otherwise protected from use or disclosure. If you are not the intended recipient, you are strictly prohibited from reviewing, disclosing, copying using or disseminating any of this information or taking any action in reliance on or regarding this information. If you have received this fax in error, please notify us immediately by telephone so that we can  arrange for its return to [place]. Phone: [phone], Toll-Free: [phone], Fax: [phone] Page: 2 of 2 Call Id: [id number] 12/23/2019 9:12:24 AM See HCP within 4 Hours (or PCP triage) Yes Laurena Bering, RN, [place] Artist Disagree/Comply Comply Caller Understands Yes PreDisposition Call Doctor Care Advice Given Per Guideline SEE HCP WITHIN 4 HOURS (OR PCP TRIAGE): REST: * Lie down and rest. * Do this until seen. NOTHING BY MOUTH: * Do not eat or drink anything for now. CALL BACK IF: * You become worse. CARE ADVICE given per Abdominal Pain, Female (Adult) guideline. Comments User: April Peng, RN Date/Time Eilene Ghazi Time): 12/23/2019 9:37:24 AM Contacted [place] office. Has appointment at [facility] and will call caller Referrals Warm transfer to backline

## 2019-12-23 NOTE — Progress Notes (Signed)
   Subjective:    Patient ID: April [name], female    DOB: October 30, 1934, 85 y.o.   MRN: [id number]  HPI abd pain- 'I am hurting and I do not understand why'.  sxs started 'a week or so'- persisted despite tx for UTI.  Pain is worse on L side and is across lower abd.  No N/V.  No diarrhea.  Urology prescribed Macrobid which she finished yesterday.  No burning w/ urination- using a catheter 'off and on for at least a month'.   Pt went to ER on 5/6 for UTI but this pain is different.  Had CT scan that day and was unchanged w/ exception of cystitis and top normal sized spleen.  Current pain is constant.  Pt can't remember if this pain is similar to something she has had before.  Last BM was last night, she had to strain 'a little bit'.  Denies constipation but typically goes 2-3 days between BMs.  Abd is TTP.  No relief w/ tylenol   Review of Systems For ROS see HPI     Objective:   Physical Exam Vitals reviewed.  Constitutional:      General: She is not in acute distress.    Appearance: She is not ill-appearing.     Comments: Frail, elderly woman  HENT:     Head: Normocephalic and atraumatic.  Cardiovascular:     Rate and Rhythm: Normal rate and regular rhythm.  Pulmonary:     Effort: Pulmonary effort is normal.     Breath sounds: Normal breath sounds.  Abdominal:     General: Bowel sounds are normal. There is distension (mild).     Tenderness: There is abdominal tenderness in the left upper quadrant. There is no right CVA tenderness, left CVA tenderness, guarding or rebound.     Comments: TTP LUQ extending to L flank  Skin:    General: Skin is warm and dry.     Findings: No rash.  Neurological:     Mental Status: She is alert.           Assessment & Plan:  LUQ pain- new to provider but pt has hx of similar.  Reviewed recent ER notes, labs, and CT.  She is TTP over the area that would correspond to spleen- which was at upper limit of normal size.  Has discussed sxs w/ Oncology  due to hx of CLL.  UA today w/o signs of infxn.  No red flag today that would prompt repeat CT scan.  Unclear if her pain is related to her previous vertebral fxs.  Start Tramadol for pain relief, check labs, and she is to follow up w/ PCP.

## 2019-12-23 NOTE — Telephone Encounter (Signed)
Patient has been scheduled with Dr Birdie Riddle at 11 am.

## 2019-12-23 NOTE — Telephone Encounter (Signed)
Pt was seen at ED for uti 5/6. Pt finish medications and still having left side abdominal pain. Pt would like to be seen or want to know if there is something she can do for pain.

## 2019-12-23 NOTE — Patient Instructions (Signed)
Please call and schedule a follow up w/ Dr Regenia Skeeter notify you of your lab results and make any changes if needed Continue to drink plenty of fluids and make sure you are eating regularly Use the Tramadol as needed for pain If your symptoms change or worsen, please let your medical team know April [name] in there!

## 2019-12-24 DIAGNOSIS — R1032 Left lower quadrant pain: Secondary | ICD-10-CM | POA: Diagnosis not present

## 2019-12-24 DIAGNOSIS — R338 Other retention of urine: Secondary | ICD-10-CM | POA: Diagnosis not present

## 2019-12-24 DIAGNOSIS — R1012 Left upper quadrant pain: Secondary | ICD-10-CM | POA: Diagnosis not present

## 2019-12-24 DIAGNOSIS — R109 Unspecified abdominal pain: Secondary | ICD-10-CM | POA: Diagnosis not present

## 2019-12-29 ENCOUNTER — Ambulatory Visit: Payer: Medicare PPO | Admitting: Obstetrics and Gynecology

## 2019-12-29 ENCOUNTER — Other Ambulatory Visit: Payer: Self-pay

## 2019-12-29 ENCOUNTER — Encounter: Payer: Self-pay | Admitting: Obstetrics and Gynecology

## 2019-12-29 VITALS — BP 118/74

## 2019-12-29 DIAGNOSIS — N3000 Acute cystitis without hematuria: Secondary | ICD-10-CM | POA: Diagnosis not present

## 2019-12-29 DIAGNOSIS — N9089 Other specified noninflammatory disorders of vulva and perineum: Secondary | ICD-10-CM

## 2019-12-29 DIAGNOSIS — R8271 Bacteriuria: Secondary | ICD-10-CM | POA: Diagnosis not present

## 2019-12-29 LAB — WET PREP FOR TRICH, YEAST, CLUE

## 2019-12-29 NOTE — Progress Notes (Signed)
   April [name] 05/26/1935 [id number]  SUBJECTIVE:  85 y.o. G0P0 female presents for possible yeast infection.  No vaginal discharge or bleeding.  Used antibiotics to treat a UTI recently.  She is following with urology for that.  She is catheterizing her bladder regularly due to bladder dysfunction.  She is cleansing prior to catheterization with antibacterial hand wipes.  Current Outpatient Medications  Medication Sig Dispense Refill  . acetaminophen (TYLENOL) 500 MG tablet Take 2 tablets (1,000 mg total) by mouth every 8 (eight) hours. (Patient taking differently: Take 1,000 mg by mouth every 8 (eight) hours as needed for moderate pain. ) 180 tablet 11  . augmented betamethasone dipropionate (DIPROLENE-AF) 0.05 % cream Apply 1 application topically 2 (two) times daily as needed (flare ups).   2  . [employer] ACID CONTROLLER 10 MG tablet TAKE 1 TABLET BY MOUTH EVERYDAY AT BEDTIME (Patient not taking: Reported on 09/17/2019) 30 tablet 0  . polyethylene glycol (MIRALAX / GLYCOLAX) 17 g packet Take 17 g 1-3 times a day as needed for constipation 42 each 0  . prochlorperazine (COMPAZINE) 5 MG tablet Take 5 mg by mouth every 6 (six) hours as needed for nausea or vomiting.     . senna-docusate (SENOKOT-S) 8.6-50 MG tablet Take 1 tablet 1-2 times a day as needed for constipation 90 tablet 0  . bisacodyl (DULCOLAX) 10 MG suppository Place 1 suppository (10 mg total) rectally as needed for moderate constipation. (Patient not taking: Reported on 12/23/2019) 12 suppository 0  . sucralfate (CARAFATE) 1 GM/10ML suspension 1 GRAM 2X DAILY BEFORE MEALS AS NEEDED (Patient not taking: Reported on 09/28/2019) 420 mL 0   No current facility-administered medications for this visit.   Allergies: Codeine and Levofloxacin  Patient's last menstrual period was 03/26/1979.  Past medical history,surgical history, problem list, medications, allergies, family history and social history were all reviewed and documented as reviewed  in the EPIC chart.  OBJECTIVE:  BP 118/74   LMP 03/26/1979  The patient appears well, alert, oriented x 3, in no distress. PELVIC EXAM: VULVA: normal appearing vulva with no masses, tenderness or lesions, mild erythema over the periurethral tissues, no exudate or masses, VAGINA: normal appearing vagina with normal color and discharge, no lesions, CERVIX: surgically absent, UTERUS: surgically absent, vaginal cuff normal, ADNEXA: nontender and no masses, WET MOUNT done - results: negative for pathogens, normal epithelial cells  Chaperone: [name] present during the examination  ASSESSMENT:  85 y.o. G0P0 here for vulvar irritation likely due to chemical irritant  PLAN:  Recommended that she consult with urology regarding alternative options for cleansing prior to ladder catheterization as this is probably the culprit for her increasing vulvar irritation.  No evidence of vaginal yeast infection or other vaginitis/vulvitis on the examination and testing today.  UA not collected as she apparently has a repeat UA/culture pending with urology from her visit with them earlier today.  She will let us know if her symptoms do not improve.  Joseph Pierini MD 12/29/19

## 2019-12-30 ENCOUNTER — Telehealth: Payer: Medicare PPO | Admitting: Internal Medicine

## 2020-01-06 DIAGNOSIS — C50911 Malignant neoplasm of unspecified site of right female breast: Secondary | ICD-10-CM | POA: Diagnosis not present

## 2020-01-06 DIAGNOSIS — C50912 Malignant neoplasm of unspecified site of left female breast: Secondary | ICD-10-CM | POA: Diagnosis not present

## 2020-01-12 DIAGNOSIS — N3000 Acute cystitis without hematuria: Secondary | ICD-10-CM | POA: Diagnosis not present

## 2020-01-21 DIAGNOSIS — R102 Pelvic and perineal pain: Secondary | ICD-10-CM | POA: Diagnosis not present

## 2020-01-21 DIAGNOSIS — N3020 Other chronic cystitis without hematuria: Secondary | ICD-10-CM | POA: Diagnosis not present

## 2020-01-21 DIAGNOSIS — R338 Other retention of urine: Secondary | ICD-10-CM | POA: Diagnosis not present

## 2020-01-21 DIAGNOSIS — R1012 Left upper quadrant pain: Secondary | ICD-10-CM | POA: Diagnosis not present

## 2020-01-21 DIAGNOSIS — R8271 Bacteriuria: Secondary | ICD-10-CM | POA: Diagnosis not present

## 2020-01-26 DIAGNOSIS — N3000 Acute cystitis without hematuria: Secondary | ICD-10-CM | POA: Diagnosis not present

## 2020-02-03 DIAGNOSIS — N3020 Other chronic cystitis without hematuria: Secondary | ICD-10-CM | POA: Diagnosis not present

## 2020-02-17 ENCOUNTER — Encounter: Payer: Self-pay | Admitting: Gynecology

## 2020-02-23 DIAGNOSIS — R339 Retention of urine, unspecified: Secondary | ICD-10-CM | POA: Diagnosis not present

## 2020-02-23 DIAGNOSIS — N3020 Other chronic cystitis without hematuria: Secondary | ICD-10-CM | POA: Diagnosis not present

## 2020-02-23 DIAGNOSIS — R8271 Bacteriuria: Secondary | ICD-10-CM | POA: Diagnosis not present

## 2020-02-25 DIAGNOSIS — N3020 Other chronic cystitis without hematuria: Secondary | ICD-10-CM | POA: Diagnosis not present

## 2020-02-25 DIAGNOSIS — R338 Other retention of urine: Secondary | ICD-10-CM | POA: Diagnosis not present

## 2020-03-02 DIAGNOSIS — N3000 Acute cystitis without hematuria: Secondary | ICD-10-CM | POA: Diagnosis not present

## 2020-03-08 ENCOUNTER — Other Ambulatory Visit: Payer: Self-pay

## 2020-03-08 ENCOUNTER — Ambulatory Visit: Payer: Medicare PPO | Admitting: Internal Medicine

## 2020-03-08 ENCOUNTER — Encounter: Payer: Self-pay | Admitting: Internal Medicine

## 2020-03-08 VITALS — BP 120/84 | HR 86 | Temp 97.6°F | Ht 59.84 in | Wt 91.6 lb

## 2020-03-08 DIAGNOSIS — S22060G Wedge compression fracture of T7-T8 vertebra, subsequent encounter for fracture with delayed healing: Secondary | ICD-10-CM

## 2020-03-08 DIAGNOSIS — I10 Essential (primary) hypertension: Secondary | ICD-10-CM | POA: Diagnosis not present

## 2020-03-08 DIAGNOSIS — M549 Dorsalgia, unspecified: Secondary | ICD-10-CM | POA: Diagnosis not present

## 2020-03-08 DIAGNOSIS — E785 Hyperlipidemia, unspecified: Secondary | ICD-10-CM

## 2020-03-08 DIAGNOSIS — C9110 Chronic lymphocytic leukemia of B-cell type not having achieved remission: Secondary | ICD-10-CM | POA: Diagnosis not present

## 2020-03-08 DIAGNOSIS — E559 Vitamin D deficiency, unspecified: Secondary | ICD-10-CM | POA: Diagnosis not present

## 2020-03-08 DIAGNOSIS — R627 Adult failure to thrive: Secondary | ICD-10-CM

## 2020-03-08 DIAGNOSIS — D696 Thrombocytopenia, unspecified: Secondary | ICD-10-CM | POA: Diagnosis not present

## 2020-03-08 DIAGNOSIS — N3289 Other specified disorders of bladder: Secondary | ICD-10-CM

## 2020-03-08 NOTE — Patient Instructions (Signed)
-  Nice seeing you today!!  -Lab work today; will notify you once results are available.  -Schedule follow up in 6 months. 

## 2020-03-08 NOTE — Progress Notes (Signed)
Established Patient Office Visit     This visit occurred during the SARS-CoV-2 public health emergency.  Safety protocols were in place, including screening questions prior to the visit, additional usage of staff PPE, and extensive cleaning of exam room while observing appropriate contact time as indicated for disinfecting solutions.    CC/Reason for Visit: Follow-up chronic conditions, discuss recurrent UTIs  HPI: April [name] is a 85 y.o. female who is coming in today for the above mentioned reasons. Past Medical History is significant for: Hypertension, hyperlipidemia, CLL, chronic back pain due to vertebral compression fractures, adult failure to thrive and most recently urinary retention and frequent UTIs.  Her CLL is stable and will follow up in 1 year with oncology.  She was most recently seen by urology on July 29, she continues to have bilateral lower quadrant pain, on cystoscopy she was found to have urinary retention and has been self catheterizing 4-6 times a day.  They recently decided to try weekly gentamicin bladder irrigations but the patient feels like this is not helping.  She is currently awaiting a phone call back from urology in regards to her urine culture she had last week she feels like she again has an infection.  She wants to know what else can be done in regards to her health.  She has not had lab work since 2019, I feel like due to her fatigue and failure to thrive she would benefit from TSH, vitamin B12 and vitamin D levels.  We should also update her cholesterol, blood counts and liver function tests.   Past Medical/Surgical History: Past Medical History:  Diagnosis Date  . Breast CA ([place])   . Bronchitis, chronic ([place])   . Eye hemorrhage, left   . Leukemia, chronic lymphoid ([place])   . 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 Osteoporosis 05/2018   T score -3.1 overall stable from prior study    Past Surgical History:  Procedure  Laterality Date  . ABDOMINAL HYSTERECTOMY  1980  . BREAST IMPLANTS REMOVED  2001  . BREAST SURGERY     Bilateral mastectomy  . IR RADIOLOGIST EVAL & MGMT  07/01/2019  . MASTECTOMY  1982   BILATERAL  . OOPHORECTOMY     BSO  . PARTIAL COLECTOMY     INTESTINAL ABSCESS WITH SALPINGECTOMY  . RECONSTRUCTION LEFT ELBOW    . TONSILLECTOMY    . UMBILLICAL HERNIA REPAIR      Social History:  reports that she quit smoking about 64 years ago. Her smoking use included cigarettes. She has never used smokeless tobacco. She reports that she does not drink alcohol and does not use drugs.  Allergies: Allergies  Allergen Reactions  . Codeine Nausea Only  . Levofloxacin Rash    Family History:  Family History  Problem Relation Age of Onset  . Cancer Brother        HODGKINS  . Cancer Brother        leukemia  . Parkinson's disease Brother   . Heart disease Mother   . Heart disease Sister      Current Outpatient Medications:  .  acetaminophen (TYLENOL) 500 MG tablet, Take 2 tablets (1,000 mg total) by mouth every 8 (eight) hours. (Patient taking differently: Take 1,000 mg by mouth every 8 (eight) hours as needed for moderate pain. ), Disp: 180 tablet, Rfl: 11 .  augmented betamethasone dipropionate (DIPROLENE-AF) 0.05 % cream, Apply 1 application topically 2 (two)  times daily as needed (flare ups). , Disp: , Rfl: 2 .  bisacodyl (DULCOLAX) 10 MG suppository, Place 1 suppository (10 mg total) rectally as needed for moderate constipation., Disp: 12 suppository, Rfl: 0 .  [employer] ACID CONTROLLER 10 MG tablet, TAKE 1 TABLET BY MOUTH EVERYDAY AT BEDTIME, Disp: 30 tablet, Rfl: 0 .  polyethylene glycol (MIRALAX / GLYCOLAX) 17 g packet, Take 17 g 1-3 times a day as needed for constipation, Disp: 42 each, Rfl: 0 .  prochlorperazine (COMPAZINE) 5 MG tablet, Take 5 mg by mouth every 6 (six) hours as needed for nausea or vomiting. , Disp: , Rfl:  .  senna-docusate (SENOKOT-S) 8.6-50 MG tablet, Take 1 tablet  1-2 times a day as needed for constipation, Disp: 90 tablet, Rfl: 0 .  sucralfate (CARAFATE) 1 GM/10ML suspension, 1 GRAM 2X DAILY BEFORE MEALS AS NEEDED, Disp: 420 mL, Rfl: 0  Review of Systems:  Constitutional: Denies fever, chills, diaphoresis, appetite change. HEENT: Denies photophobia, eye pain, redness, hearing loss, ear pain, congestion, sore throat, rhinorrhea, sneezing, mouth sores, trouble swallowing, neck pain, neck stiffness and tinnitus.   Respiratory: Denies SOB, DOE, cough, chest tightness,  and wheezing.   Cardiovascular: Denies chest pain, palpitations and leg swelling.  Gastrointestinal: Denies nausea, vomiting, abdominal pain, diarrhea, constipation, blood in stool and abdominal distention.  Genitourinary: Positive for dysuria, urgency, frequency, hematuria, flank pain and difficulty urinating.  Endocrine: Denies: hot or cold intolerance, sweats, changes in hair or nails, polyuria, polydipsia. Musculoskeletal: Denies myalgias, back pain, joint swelling, arthralgias and gait problem.  Skin: Denies pallor, rash and wound.  Neurological: Denies dizziness, seizures, syncope,  light-headedness, numbness and headaches.  Hematological: Denies adenopathy. Easy bruising, personal or family bleeding history  Psychiatric/Behavioral: Denies suicidal ideation, mood changes, confusion, nervousness, sleep disturbance and agitation    Physical Exam: Vitals:   03/08/20 1003  BP: 120/84  Pulse: 86  Temp: 97.6 F (36.4 C)  TempSrc: Oral  SpO2: 99%  Weight: 91 lb 9.6 oz (41.5 kg)  Height: 4' 11.84" (1.52 m)    Body mass index is 17.98 kg/m.   Constitutional: NAD, calm, comfortable Eyes: PERRL, lids and conjunctivae normal, wears corrective lenses ENMT: Mucous membranes are moist.  Respiratory: clear to auscultation bilaterally, no wheezing, no crackles. Normal respiratory effort. No accessory muscle use.  Cardiovascular: Regular rate and rhythm, no murmurs / rubs / gallops. No  extremity edema.  Psychiatric: Normal judgment and insight. Alert and oriented x 3. Normal mood.    Impression and Plan:  Bladder distension  -I have advised continued follow-up with urology in regards to plan of care, she will ask whether increasing frequency of bladder irrigations or trying a different antibiotic for irrigation would be helpful.  Intractable back pain Closed wedge compression fracture of T8 vertebra with delayed healing, subsequent encounter -Continues to be an issue for her, she does not get chronic pain medication at this point due to issues with sedation and falls.  Failure to thrive in adult  - Plan: Hemoglobin A1c, TSH, Vitamin B12, VITAMIN D 25 Hydroxy (Vit-D Deficiency, Fractures)  CLL (chronic lymphocytic leukemia) ([place]) -Per oncology in remission.  Essential hypertension -Well-controlled today.  Dyslipidemia  - Plan: Lipid panel   Patient Instructions  -Nice seeing you today!!  -Lab work today; will notify you once results are available.  -Schedule follow up in 6 months.     Lelon Frohlich, MD [facility] at [facility]

## 2020-03-09 ENCOUNTER — Other Ambulatory Visit: Payer: Self-pay | Admitting: Internal Medicine

## 2020-03-09 ENCOUNTER — Encounter: Payer: Self-pay | Admitting: Internal Medicine

## 2020-03-09 DIAGNOSIS — R7401 Elevation of levels of liver transaminase levels: Secondary | ICD-10-CM | POA: Insufficient documentation

## 2020-03-09 DIAGNOSIS — E559 Vitamin D deficiency, unspecified: Secondary | ICD-10-CM

## 2020-03-09 LAB — HEMOGLOBIN A1C
Hgb A1c MFr Bld: 5.1 % of total Hgb (ref <5.7)
Mean Plasma Glucose: 100 (calc)
eAG (mmol/L): 5.5 (calc)

## 2020-03-09 LAB — CBC WITH DIFFERENTIAL/PLATELET
Absolute Monocytes: 791 cells/uL (ref 200–950)
Basophils Absolute: 43 cells/uL (ref 0–200)
Basophils Relative: 0.5 %
Eosinophils Absolute: 69 cells/uL (ref 15–500)
Eosinophils Relative: 0.8 %
HCT: 40.6 % (ref 35.0–45.0)
Hemoglobin: 14 g/dL (ref 11.7–15.5)
Lymphs Abs: 4119 cells/uL — ABNORMAL HIGH (ref 850–3900)
MCH: 34.6 pg — ABNORMAL HIGH (ref 27.0–33.0)
MCHC: 34.5 g/dL (ref 32.0–36.0)
MCV: 100.2 fL — ABNORMAL HIGH (ref 80.0–100.0)
MPV: 11.4 fL (ref 7.5–12.5)
Monocytes Relative: 9.2 %
Neutro Abs: 3578 cells/uL (ref 1500–7800)
Neutrophils Relative %: 41.6 %
Platelets: 122 10*3/uL — ABNORMAL LOW (ref 140–400)
RBC: 4.05 10*6/uL (ref 3.80–5.10)
RDW: 12.4 % (ref 11.0–15.0)
Total Lymphocyte: 47.9 %
WBC: 8.6 10*3/uL (ref 3.8–10.8)

## 2020-03-09 LAB — COMPREHENSIVE METABOLIC PANEL
AG Ratio: 1.8 (calc) (ref 1.0–2.5)
ALT: 37 U/L — ABNORMAL HIGH (ref 6–29)
AST: 36 U/L — ABNORMAL HIGH (ref 10–35)
Albumin: 4.2 g/dL (ref 3.6–5.1)
Alkaline phosphatase (APISO): 140 U/L (ref 37–153)
BUN: 16 mg/dL (ref 7–25)
CO2: 25 mmol/L (ref 20–32)
Calcium: 9.4 mg/dL (ref 8.6–10.4)
Chloride: 105 mmol/L (ref 98–110)
Creat: 0.77 mg/dL (ref 0.60–0.88)
Globulin: 2.3 g/dL (calc) (ref 1.9–3.7)
Glucose, Bld: 103 mg/dL — ABNORMAL HIGH (ref 65–99)
Potassium: 4.2 mmol/L (ref 3.5–5.3)
Sodium: 139 mmol/L (ref 135–146)
Total Bilirubin: 1.1 mg/dL (ref 0.2–1.2)
Total Protein: 6.5 g/dL (ref 6.1–8.1)

## 2020-03-09 LAB — LIPID PANEL
Cholesterol: 178 mg/dL (ref <200)
HDL: 70 mg/dL (ref >=50)
LDL Cholesterol (Calc): 92 mg/dL (calc)
Non-HDL Cholesterol (Calc): 108 mg/dL (calc) (ref <130)
Total CHOL/HDL Ratio: 2.5 (calc) (ref <5.0)
Triglycerides: 69 mg/dL (ref <150)

## 2020-03-09 LAB — VITAMIN B12: Vitamin B-12: 333 pg/mL (ref 200–1100)

## 2020-03-09 LAB — TSH: TSH: 1.13 mIU/L (ref 0.40–4.50)

## 2020-03-09 LAB — VITAMIN D 25 HYDROXY (VIT D DEFICIENCY, FRACTURES): Vit D, 25-Hydroxy: 22 ng/mL — ABNORMAL LOW (ref 30–100)

## 2020-03-09 MED ORDER — VITAMIN D (ERGOCALCIFEROL) 1.25 MG (50000 UNIT) PO CAPS: 50000.0000 [IU] | ORAL_CAPSULE | ORAL | 0 refills | Status: AC

## 2020-03-10 ENCOUNTER — Other Ambulatory Visit: Payer: Self-pay | Admitting: Internal Medicine

## 2020-03-10 DIAGNOSIS — E559 Vitamin D deficiency, unspecified: Secondary | ICD-10-CM

## 2020-03-10 DIAGNOSIS — R7989 Other specified abnormal findings of blood chemistry: Secondary | ICD-10-CM

## 2020-03-10 DIAGNOSIS — R945 Abnormal results of liver function studies: Secondary | ICD-10-CM

## 2020-03-18 DIAGNOSIS — R338 Other retention of urine: Secondary | ICD-10-CM | POA: Diagnosis not present

## 2020-03-18 DIAGNOSIS — N3020 Other chronic cystitis without hematuria: Secondary | ICD-10-CM | POA: Diagnosis not present

## 2020-03-24 DIAGNOSIS — R102 Pelvic and perineal pain: Secondary | ICD-10-CM | POA: Diagnosis not present

## 2020-03-24 DIAGNOSIS — N3000 Acute cystitis without hematuria: Secondary | ICD-10-CM | POA: Diagnosis not present

## 2020-03-24 DIAGNOSIS — R339 Retention of urine, unspecified: Secondary | ICD-10-CM | POA: Diagnosis not present

## 2020-04-01 DIAGNOSIS — N3000 Acute cystitis without hematuria: Secondary | ICD-10-CM | POA: Diagnosis not present

## 2020-04-11 ENCOUNTER — Other Ambulatory Visit: Payer: Self-pay

## 2020-04-12 ENCOUNTER — Encounter: Payer: Self-pay | Admitting: Internal Medicine

## 2020-04-12 ENCOUNTER — Ambulatory Visit: Payer: Medicare PPO | Admitting: Internal Medicine

## 2020-04-12 VITALS — BP 110/70 | HR 92 | Temp 98.2°F | Wt 92.1 lb

## 2020-04-12 DIAGNOSIS — R102 Pelvic and perineal pain: Secondary | ICD-10-CM | POA: Diagnosis not present

## 2020-04-12 DIAGNOSIS — S22060G Wedge compression fracture of T7-T8 vertebra, subsequent encounter for fracture with delayed healing: Secondary | ICD-10-CM | POA: Diagnosis not present

## 2020-04-12 DIAGNOSIS — E559 Vitamin D deficiency, unspecified: Secondary | ICD-10-CM

## 2020-04-12 DIAGNOSIS — Z23 Encounter for immunization: Secondary | ICD-10-CM | POA: Diagnosis not present

## 2020-04-12 DIAGNOSIS — C9110 Chronic lymphocytic leukemia of B-cell type not having achieved remission: Secondary | ICD-10-CM

## 2020-04-12 DIAGNOSIS — M549 Dorsalgia, unspecified: Secondary | ICD-10-CM | POA: Diagnosis not present

## 2020-04-12 DIAGNOSIS — R109 Unspecified abdominal pain: Secondary | ICD-10-CM | POA: Diagnosis not present

## 2020-04-12 DIAGNOSIS — R8271 Bacteriuria: Secondary | ICD-10-CM | POA: Diagnosis not present

## 2020-04-12 DIAGNOSIS — N390 Urinary tract infection, site not specified: Secondary | ICD-10-CM

## 2020-04-12 MED ORDER — TRAMADOL HCL 50 MG PO TABS: 25.0000 mg | ORAL_TABLET | Freq: Two times a day (BID) | ORAL | 0 refills | Status: DC | PRN

## 2020-04-12 NOTE — Progress Notes (Signed)
Established Patient Office Visit     This visit occurred during the SARS-CoV-2 public health emergency.  Safety protocols were in place, including screening questions prior to the visit, additional usage of staff PPE, and extensive cleaning of exam room while observing appropriate contact time as indicated for disinfecting solutions.    CC/Reason for Visit: Discuss chronic back pain  HPI: April [name] is a 85 y.o. female who is coming in today for the above mentioned reasons. Past Medical History is significant for:  Hypertension, hyperlipidemia, CLL, chronic back pain due to vertebral compression fractures, adult failure to thrive and most recently urinary retention and frequent UTIs.  Her CLL is stable and will follow up in 1 year with oncology.  She has been self catheterizing due to urinary retention and recurrent UTIs.  She states she finished her last antibiotic about 10 days ago.  She noticed increasing back pain yesterday.  She already has an appointment with urology later today to make sure that her back pain is not related to another UTI.  She does have a history of vertebral compression fractures and feels like the back pain related to this significantly impact her life.  She had been on narcotics before and these were discontinued due to metabolic encephalopathy requiring hospitalization.  She states back pain is intolerable and needs something for pain.  She is requesting referral to orthopedics for second opinion.  At last visit she was also diagnosed with vitamin D deficiency and is currently on high-dose weekly supplementation.  Past Medical/Surgical History: Past Medical History:  Diagnosis Date  . Breast CA ([place])   . Bronchitis, chronic ([place])   . Eye hemorrhage, left   . Leukemia, chronic lymphoid ([place])   . 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 Osteoporosis 05/2018   T score -3.1 overall stable from prior study    Past Surgical History:   Procedure Laterality Date  . ABDOMINAL HYSTERECTOMY  1980  . BREAST IMPLANTS REMOVED  2001  . BREAST SURGERY     Bilateral mastectomy  . IR RADIOLOGIST EVAL & MGMT  07/01/2019  . MASTECTOMY  1982   BILATERAL  . OOPHORECTOMY     BSO  . PARTIAL COLECTOMY     INTESTINAL ABSCESS WITH SALPINGECTOMY  . RECONSTRUCTION LEFT ELBOW    . TONSILLECTOMY    . UMBILLICAL HERNIA REPAIR      Social History:  reports that she quit smoking about 65 years ago. Her smoking use included cigarettes. She has never used smokeless tobacco. She reports that she does not drink alcohol and does not use drugs.  Allergies: Allergies  Allergen Reactions  . Codeine Nausea Only  . Levofloxacin Rash    Family History:  Family History  Problem Relation Age of Onset  . Cancer Brother        HODGKINS  . Cancer Brother        leukemia  . Parkinson's disease Brother   . Heart disease Mother   . Heart disease Sister      Current Outpatient Medications:  .  acetaminophen (TYLENOL) 500 MG tablet, Take 2 tablets (1,000 mg total) by mouth every 8 (eight) hours. (Patient taking differently: Take 1,000 mg by mouth every 8 (eight) hours as needed for moderate pain. ), Disp: 180 tablet, Rfl: 11 .  augmented betamethasone dipropionate (DIPROLENE-AF) 0.05 % cream, Apply 1 application topically 2 (two) times daily as needed (flare ups). , Disp: , Rfl:  2 .  bisacodyl (DULCOLAX) 10 MG suppository, Place 1 suppository (10 mg total) rectally as needed for moderate constipation., Disp: 12 suppository, Rfl: 0 .  [employer] ACID CONTROLLER 10 MG tablet, TAKE 1 TABLET BY MOUTH EVERYDAY AT BEDTIME, Disp: 30 tablet, Rfl: 0 .  polyethylene glycol (MIRALAX / GLYCOLAX) 17 g packet, Take 17 g 1-3 times a day as needed for constipation, Disp: 42 each, Rfl: 0 .  prochlorperazine (COMPAZINE) 5 MG tablet, Take 5 mg by mouth every 6 (six) hours as needed for nausea or vomiting. , Disp: , Rfl:  .  senna-docusate (SENOKOT-S) 8.6-50 MG tablet, Take  1 tablet 1-2 times a day as needed for constipation, Disp: 90 tablet, Rfl: 0 .  sucralfate (CARAFATE) 1 GM/10ML suspension, 1 GRAM 2X DAILY BEFORE MEALS AS NEEDED, Disp: 420 mL, Rfl: 0 .  Vitamin D, Ergocalciferol, (DRISDOL) 1.25 MG (50000 UNIT) CAPS capsule, Take 1 capsule (50,000 Units total) by mouth every 7 (seven) days for 12 doses., Disp: 12 capsule, Rfl: 0 .  traMADol (ULTRAM) 50 MG tablet, Take 0.5 tablets (25 mg total) by mouth every 12 (twelve) hours as needed., Disp: 15 tablet, Rfl: 0  Review of Systems:  Constitutional: Denies fever, chills, diaphoresis, appetite change and fatigue.  HEENT: Denies photophobia, eye pain, redness, hearing loss, ear pain, congestion, sore throat, rhinorrhea, sneezing, mouth sores, trouble swallowing, neck pain, neck stiffness and tinnitus.   Respiratory: Denies SOB, DOE, cough, chest tightness,  and wheezing.   Cardiovascular: Denies chest pain, palpitations and leg swelling.  Gastrointestinal: Denies nausea, vomiting, abdominal pain, diarrhea, constipation, blood in stool and abdominal distention.  Genitourinary: Denies dysuria, urgency, frequency, hematuria, flank pain and difficulty urinating.  Endocrine: Denies: hot or cold intolerance, sweats, changes in hair or nails, polyuria, polydipsia. Musculoskeletal: Denies myalgias, joint swelling, arthralgias and gait problem.  Skin: Denies pallor, rash and wound.  Neurological: Denies dizziness, seizures, syncope, weakness, light-headedness, numbness and headaches.  Hematological: Denies adenopathy. Easy bruising, personal or family bleeding history  Psychiatric/Behavioral: Denies suicidal ideation, mood changes, confusion, nervousness, sleep disturbance and agitation    Physical Exam: Vitals:   04/12/20 1027  BP: 110/70  Pulse: 92  Temp: 98.2 F (36.8 C)  TempSrc: Oral  SpO2: 95%  Weight: 92 lb 1.6 oz (41.8 kg)    Body mass index is 18.08 kg/m.   Constitutional: NAD, calm,  comfortable Eyes: PERRL, lids and conjunctivae normal ENMT: Mucous membranes are moist. Respiratory: clear to auscultation bilaterally, no wheezing, no crackles. Normal respiratory effort. No accessory muscle use.  Cardiovascular: Regular rate and rhythm, no murmurs / rubs / gallops. No extremity edema.  Abdomen: no tenderness, no masses palpated. No hepatosplenomegaly. Bowel sounds positive.  Psychiatric: Normal judgment and insight. Alert and oriented x 3. Normal mood.    Impression and Plan:  Intractable back pain  -Unclear if related to recurrent UTI or her vertebral compression fractures, probably a combination of both. -She has follow-up with urology later today. -I will reluctantly prescribe low-dose tramadol (50 mg) that she will cut in half and take up to twice daily as needed.  She knows to take Colace/MiraLAX with this as it may slow down her intestinal transit and cause constipation.  Closed wedge compression fracture of T8 vertebra with delayed healing, subsequent encounter -She is requesting referral to orthopedics for second opinion, once while hospitalized it was deemed that she was not an appropriate candidate for a vertebroplasty although I am not 100% clear why.  Vitamin D deficiency -On  weekly high-dose supplementation, check levels when she completes.   CLL (chronic lymphocytic leukemia) ([facility]) -Stable, currently follows with oncology on an annual basis.  Recurrent UTI -Has follow-up with urology today.  Need for influenza vaccination -Flu vaccine administered today    Patient Instructions  -Nice seeing you today!!  -Start tramadol 50 mg (half tablet twice daily as needed for back pain).  -May consider physical therapy if not better in 10-14 days.  -Make sure you follow up with Dr. Claudia Desanctis.  -Flu vaccine today.  -Schedule follow up in 3 months.     Lelon Frohlich, MD [facility] at [facility]

## 2020-04-12 NOTE — Addendum Note (Signed)
Addended by: Westley Hummer B on: 04/12/2020 02:45 PM   Modules accepted: Orders

## 2020-04-12 NOTE — Patient Instructions (Signed)
-  Nice seeing you today!!  -Start tramadol 50 mg (half tablet twice daily as needed for back pain).  -May consider physical therapy if not better in 10-14 days.  -Make sure you follow up with Dr. Claudia Desanctis.  -Flu vaccine today.  -Schedule follow up in 3 months.

## 2020-04-15 ENCOUNTER — Encounter: Payer: Self-pay | Admitting: Internal Medicine

## 2020-04-19 ENCOUNTER — Ambulatory Visit: Payer: Self-pay

## 2020-04-19 ENCOUNTER — Encounter: Payer: Self-pay | Admitting: Orthopaedic Surgery

## 2020-04-19 ENCOUNTER — Ambulatory Visit: Payer: Medicare PPO | Admitting: Orthopaedic Surgery

## 2020-04-19 VITALS — Ht 59.84 in | Wt 92.0 lb

## 2020-04-19 DIAGNOSIS — M549 Dorsalgia, unspecified: Secondary | ICD-10-CM | POA: Diagnosis not present

## 2020-04-20 ENCOUNTER — Encounter: Payer: Self-pay | Admitting: Internal Medicine

## 2020-04-22 NOTE — Progress Notes (Signed)
Office Visit Note   Patient: April [name]           Date of Birth: 08/11/1934           MRN: [id number] Visit Date: 04/19/2020              Requested by: Isaac Bliss, Rayford Halsted, MD [place],   [id number] PCP: Isaac Bliss, Rayford Halsted, MD   Assessment & Plan: Visit Diagnoses:  1. Mid back pain     Plan: Reviewed images and discussed the compression fractures.  We discussed calcium and vitamin D and walking program.  Follow-Up Instructions: No follow-ups on file.   Orders:  Orders Placed This Encounter  Procedures  . XR Thoracic Spine 2 View   No orders of the defined types were placed in this encounter.     Procedures: No procedures performed   Clinical Data: No additional findings.   Subjective: Chief Complaint  Patient presents with  . Middle Back - Fracture    HPI 85 year old female with history of CLL and previous history of breast cancer had an MRI scan done thoracic spine which showed T8 compression fracture on 07/15/2019.  She was deemed not a good candidate when she was in the hospital for vertebroplasty in May.  She states she has back pain all the time has had multiple histories of injuries to her back.  Patient is used tramadol with relief.  Review of Systems history of CLL stage IV, breast cancer, history of encephalopathy.  Chronic back pain, thoracic compression fractures   Objective: Vital Signs: Ht 4' 11.84" (1.52 m)   Wt 92 lb (41.7 kg)   LMP 03/26/1979   BMI 18.06 kg/m   Physical Exam Constitutional:      Appearance: She is well-developed.  HENT:     Head: Normocephalic.     Right Ear: External ear normal.     Left Ear: External ear normal.  Eyes:     Pupils: Pupils are equal, round, and reactive to light.  Neck:     Thyroid: No thyromegaly.     Trachea: No tracheal deviation.  Cardiovascular:     Rate and Rhythm: Normal rate.  Pulmonary:     Effort: Pulmonary effort is normal.  Abdominal:      Palpations: Abdomen is soft.  Skin:    General: Skin is warm and dry.  Neurological:     Mental Status: She is alert and oriented to person, place, and time.  Psychiatric:        Behavior: Behavior normal.     Ortho Exam thoracic kyphosis.  Patient is amatory with a cane.  No lower extremity clonus. Specialty Comments:  No specialty comments available.  Imaging: No results found. CLINICAL DATA:  Low back pain. History of CLL and breast cancer.  EXAM: MRI THORACIC AND LUMBAR SPINE WITHOUT CONTRAST  TECHNIQUE: Multiplanar and multiecho pulse sequences of the thoracic and lumbar spine were obtained without intravenous contrast.  COMPARISON:  CT chest 06/08/2019  FINDINGS: MRI THORACIC SPINE FINDINGS  Alignment: Marked S shaped scoliosis in the thoracic and upper lumbar spine. Normal alignment  Vertebrae: Moderate compression fracture inferior endplate of T8 appears chronic and unchanged from the prior CT  Moderately severe chronic compression fracture T11 appears chronic and also unchanged from the prior CT.  Negative for acute thoracic fracture.  Cord:  Normal cord signal. No cord compression.  Paraspinal and other soft tissues: No paraspinous mass. No pleural effusion.  Disc levels:  Multilevel degenerative change. Posterior spurring at T11-T12 without significant spinal stenosis. No focal disc protrusion.  MRI LUMBAR SPINE FINDINGS  Segmentation:  Normal  Alignment:  Mild retrolisthesis L2-3. Scoliosis.  Vertebrae: Mild superior endplate fracture of L1 with bone marrow edema. This was not present on 06/08/2019 CT. No other lumbar fracture or mass  Conus medullaris and cauda equina: Conus extends to the L1-2 level. Conus and cauda equina appear normal.  Paraspinal and other soft tissues: Negative for paraspinous mass or adenopathy. No significant soft tissue edema  Disc levels:  T12-L1: Mild retropulsion of L1 into the canal  without significant spinal stenosis  L1-2: Disc bulging and mild endplate spurring without stenosis.  L2-3: Diffuse endplate spurring. Mild subarticular stenosis on the left. Mild spinal stenosis  L3-4: Mild disc bulging and mild facet degeneration. Mild subarticular stenosis on the right  L4-5: Moderate to advanced facet degeneration bilaterally causing moderate subarticular stenosis bilaterally. Mild spinal stenosis  L5-S1: Moderate disc degeneration without stenosis  IMPRESSION: 1. Mild superior endplate fracture of L1 appears acute or subacute and was not present on 06/08/19 CT. 2. Chronic compression fractures T8 and T11. 3. Marked S shaped scoliosis.  Multilevel degenerative change in the lumbar spine as above.   Electronically Signed   By: Franchot Gallo M.D.   On: 07/15/2019 19:36   PMFS History: Patient Active Problem List   Diagnosis Date Noted  . Vitamin D deficiency 03/09/2020  . Transaminitis 03/09/2020  . Bladder distension 10/13/2019  . Weight loss, abnormal 09/30/2019  . Acute metabolic encephalopathy 51/88/4166  . Other constipation 06/18/2019  . Physical debility 06/18/2019  . Abdominal distension 06/08/2019  . Severe pain 06/08/2019  . Intractable back pain 06/08/2019  . Closed wedge compression fracture of T8 vertebra ([place]) 06/07/2019  . Sinus tachycardia 06/07/2019  . Intractable pain 06/07/2019  . Goals of care, counseling/discussion 05/14/2019  . Abnormal liver enzymes 05/14/2019  . Dehydration 05/14/2019  . Failure to thrive in adult 05/14/2019  . Abdominal pain 04/16/2019  . Cancer associated pain 04/16/2019  . Breast cancer ([place]) 06/19/2018  . Skin lesion 05/06/2017  . Essential hypertension 12/14/2016  . Systolic hypertension, isolated 11/16/2016  . Central retinal vein occlusion 10/18/2016  . Preventive measure 04/28/2015  . Thrombocytopenia ([place]) 04/28/2015  . Idiopathic scoliosis 10/23/2012  . CLL (chronic lymphocytic  leukemia) ([place]) 03/02/2009  . VENOUS INSUFFICIENCY, CHRONIC 10/28/2008  . Dyslipidemia 10/27/2007  . Osteoporosis 10/27/2007  . SKIN CANCER, HX OF 10/27/2007  . DIVERTICULITIS, HX OF 04/08/2007   Past Medical History:  Diagnosis Date  . Breast CA ([place])   . Bronchitis, chronic ([place])   . Eye hemorrhage, left   . Leukemia, chronic lymphoid ([place])   . 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 Osteoporosis 05/2018   T score -3.1 overall stable from prior study    Family History  Problem Relation Age of Onset  . Cancer Brother        HODGKINS  . Cancer Brother        leukemia  . Parkinson's disease Brother   . Heart disease Mother   . Heart disease Sister     Past Surgical History:  Procedure Laterality Date  . ABDOMINAL HYSTERECTOMY  1980  . BREAST IMPLANTS REMOVED  2001  . BREAST SURGERY     Bilateral mastectomy  . IR RADIOLOGIST EVAL & MGMT  07/01/2019  . MASTECTOMY  1982   BILATERAL  . OOPHORECTOMY  BSO  . PARTIAL COLECTOMY     INTESTINAL ABSCESS WITH SALPINGECTOMY  . RECONSTRUCTION LEFT ELBOW    . TONSILLECTOMY    . UMBILLICAL HERNIA REPAIR     Social History   Occupational History  . Not on file  Tobacco Use  . Smoking status: Former Smoker    Types: Cigarettes    Quit date: 03/26/1955    Years since quitting: 65.1  . Smokeless tobacco: Never Used  Vaping Use  . Vaping Use: Never used  Substance and Sexual Activity  . Alcohol use: Never  . Drug use: No  . Sexual activity: Never    Partners: Male    Birth control/protection: Surgical    Comment: HYST-1st intercourse 85 yo-More than 5 partners

## 2020-04-25 ENCOUNTER — Encounter: Payer: Self-pay | Admitting: Internal Medicine

## 2020-04-26 DIAGNOSIS — R102 Pelvic and perineal pain: Secondary | ICD-10-CM | POA: Diagnosis not present

## 2020-04-26 DIAGNOSIS — N3000 Acute cystitis without hematuria: Secondary | ICD-10-CM | POA: Diagnosis not present

## 2020-04-26 DIAGNOSIS — N3020 Other chronic cystitis without hematuria: Secondary | ICD-10-CM | POA: Diagnosis not present

## 2020-04-27 ENCOUNTER — Other Ambulatory Visit: Payer: Self-pay | Admitting: Internal Medicine

## 2020-04-27 DIAGNOSIS — M549 Dorsalgia, unspecified: Secondary | ICD-10-CM

## 2020-04-27 MED ORDER — TRAMADOL HCL 50 MG PO TABS: 25.0000 mg | ORAL_TABLET | Freq: Two times a day (BID) | ORAL | 0 refills | Status: DC | PRN

## 2020-04-28 DIAGNOSIS — R339 Retention of urine, unspecified: Secondary | ICD-10-CM | POA: Diagnosis not present

## 2020-05-10 ENCOUNTER — Other Ambulatory Visit: Payer: Self-pay

## 2020-05-10 ENCOUNTER — Other Ambulatory Visit (INDEPENDENT_AMBULATORY_CARE_PROVIDER_SITE_OTHER): Payer: Medicare PPO

## 2020-05-10 DIAGNOSIS — R945 Abnormal results of liver function studies: Secondary | ICD-10-CM

## 2020-05-10 DIAGNOSIS — R8271 Bacteriuria: Secondary | ICD-10-CM | POA: Diagnosis not present

## 2020-05-10 DIAGNOSIS — E559 Vitamin D deficiency, unspecified: Secondary | ICD-10-CM | POA: Diagnosis not present

## 2020-05-10 DIAGNOSIS — R7989 Other specified abnormal findings of blood chemistry: Secondary | ICD-10-CM

## 2020-05-11 ENCOUNTER — Other Ambulatory Visit: Payer: Self-pay | Admitting: Internal Medicine

## 2020-05-11 ENCOUNTER — Encounter: Payer: Self-pay | Admitting: Internal Medicine

## 2020-05-11 DIAGNOSIS — M549 Dorsalgia, unspecified: Secondary | ICD-10-CM

## 2020-05-11 LAB — HEPATIC FUNCTION PANEL
AG Ratio: 1.8 (calc) (ref 1.0–2.5)
ALT: 38 U/L — ABNORMAL HIGH (ref 6–29)
AST: 32 U/L (ref 10–35)
Albumin: 4.4 g/dL (ref 3.6–5.1)
Alkaline phosphatase (APISO): 166 U/L — ABNORMAL HIGH (ref 37–153)
Bilirubin, Direct: 0.2 mg/dL (ref 0.0–0.2)
Globulin: 2.4 g/dL (calc) (ref 1.9–3.7)
Indirect Bilirubin: 0.9 mg/dL (calc) (ref 0.2–1.2)
Total Bilirubin: 1.1 mg/dL (ref 0.2–1.2)
Total Protein: 6.8 g/dL (ref 6.1–8.1)

## 2020-05-11 LAB — VITAMIN D 25 HYDROXY (VIT D DEFICIENCY, FRACTURES): Vit D, 25-Hydroxy: 111 ng/mL — ABNORMAL HIGH (ref 30–100)

## 2020-05-11 MED ORDER — TRAMADOL HCL 50 MG PO TABS: 25.0000 mg | ORAL_TABLET | Freq: Two times a day (BID) | ORAL | 0 refills | Status: DC | PRN

## 2020-05-14 ENCOUNTER — Ambulatory Visit: Payer: Medicare PPO | Attending: Internal Medicine

## 2020-05-14 DIAGNOSIS — Z23 Encounter for immunization: Secondary | ICD-10-CM

## 2020-05-14 NOTE — Progress Notes (Signed)
   Covid-19 Vaccination Clinic  Name:  TALISSA APPLE    MRN: [id number] DOB: 29-Aug-1934  05/14/2020  Ms. Scrima was observed post Covid-19 immunization for 15 minutes without incident. She was provided with Vaccine Information Sheet and instruction to access the V-Safe system.   Ms. Tilmon was instructed to call 911 with any severe reactions post vaccine: Marland Kitchen Difficulty breathing  . Swelling of face and throat  . A fast heartbeat  . A bad rash all over body  . Dizziness and weakness

## 2020-05-18 ENCOUNTER — Other Ambulatory Visit: Payer: Self-pay | Admitting: Internal Medicine

## 2020-05-18 DIAGNOSIS — E559 Vitamin D deficiency, unspecified: Secondary | ICD-10-CM

## 2020-05-19 DIAGNOSIS — N3020 Other chronic cystitis without hematuria: Secondary | ICD-10-CM | POA: Diagnosis not present

## 2020-06-02 DIAGNOSIS — N3020 Other chronic cystitis without hematuria: Secondary | ICD-10-CM | POA: Diagnosis not present

## 2020-06-02 DIAGNOSIS — R338 Other retention of urine: Secondary | ICD-10-CM | POA: Diagnosis not present

## 2020-06-02 DIAGNOSIS — R102 Pelvic and perineal pain: Secondary | ICD-10-CM | POA: Diagnosis not present

## 2020-06-07 ENCOUNTER — Other Ambulatory Visit: Payer: Self-pay

## 2020-06-07 ENCOUNTER — Other Ambulatory Visit (INDEPENDENT_AMBULATORY_CARE_PROVIDER_SITE_OTHER): Payer: Medicare PPO

## 2020-06-07 DIAGNOSIS — E559 Vitamin D deficiency, unspecified: Secondary | ICD-10-CM | POA: Diagnosis not present

## 2020-06-07 DIAGNOSIS — N3020 Other chronic cystitis without hematuria: Secondary | ICD-10-CM | POA: Diagnosis not present

## 2020-06-07 DIAGNOSIS — R1032 Left lower quadrant pain: Secondary | ICD-10-CM | POA: Diagnosis not present

## 2020-06-08 ENCOUNTER — Encounter: Payer: Self-pay | Admitting: Internal Medicine

## 2020-06-08 LAB — VITAMIN D 25 HYDROXY (VIT D DEFICIENCY, FRACTURES): Vit D, 25-Hydroxy: 78 ng/mL (ref 30–100)

## 2020-06-09 ENCOUNTER — Other Ambulatory Visit: Payer: Self-pay | Admitting: Internal Medicine

## 2020-06-09 DIAGNOSIS — R7989 Other specified abnormal findings of blood chemistry: Secondary | ICD-10-CM

## 2020-06-09 NOTE — Progress Notes (Signed)
Vi

## 2020-06-12 ENCOUNTER — Encounter: Payer: Self-pay | Admitting: Internal Medicine

## 2020-06-12 DIAGNOSIS — M549 Dorsalgia, unspecified: Secondary | ICD-10-CM

## 2020-06-14 MED ORDER — TRAMADOL HCL 50 MG PO TABS: 25.0000 mg | ORAL_TABLET | Freq: Two times a day (BID) | ORAL | 2 refills | Status: DC | PRN

## 2020-06-16 ENCOUNTER — Telehealth: Payer: Self-pay

## 2020-06-16 NOTE — Telephone Encounter (Signed)
Palliative care Volunteer call made to check in. Patient had no needs or concerns at this time.

## 2020-06-29 ENCOUNTER — Other Ambulatory Visit: Payer: Self-pay

## 2020-06-29 ENCOUNTER — Encounter: Payer: Self-pay | Admitting: Nurse Practitioner

## 2020-06-29 ENCOUNTER — Ambulatory Visit: Payer: Medicare PPO | Admitting: Nurse Practitioner

## 2020-06-29 VITALS — BP 126/80

## 2020-06-29 DIAGNOSIS — R350 Frequency of micturition: Secondary | ICD-10-CM

## 2020-06-29 DIAGNOSIS — R8290 Unspecified abnormal findings in urine: Secondary | ICD-10-CM

## 2020-06-29 DIAGNOSIS — N898 Other specified noninflammatory disorders of vagina: Secondary | ICD-10-CM

## 2020-06-29 LAB — URINALYSIS W MICROSCOPIC + REFLEX CULTURE
Bacteria, UA: NONE SEEN /HPF
Bilirubin Urine: NEGATIVE
Glucose, UA: NEGATIVE
Hyaline Cast: NONE SEEN /LPF
Ketones, ur: NEGATIVE
Leukocyte Esterase: NEGATIVE
Nitrites, Initial: NEGATIVE
Protein, ur: NEGATIVE
Specific Gravity, Urine: 1.025 (ref 1.001–1.03)
pH: 5 (ref 5.0–8.0)

## 2020-06-29 LAB — WET PREP FOR TRICH, YEAST, CLUE

## 2020-06-29 LAB — NO CULTURE INDICATED

## 2020-06-29 NOTE — Progress Notes (Signed)
   Acute Office Visit  Subjective:    Patient ID: April [name], female    DOB: 22-Apr-1935, 85 y.o.   MRN: [id number]   HPI 85 y.o. presents today for vaginal discharge without odor or itching/irritation that she first noticed a few months ago and malodorous urine.    Review of Systems  Constitutional: Negative.   Genitourinary: Positive for vaginal discharge.       Objective:    Physical Exam Constitutional:      Appearance: Normal appearance.  Genitourinary:    General: Normal vulva.     Vagina: Vaginal discharge present. No erythema.     BP 126/80   LMP 03/26/1979  Wt Readings from Last 3 Encounters:  04/19/20 92 lb (41.7 kg)  04/12/20 92 lb 1.6 oz (41.8 kg)  03/08/20 91 lb 9.6 oz (41.5 kg)   Wet prep negative UA negative    Assessment & Plan:   Problem List Items Addressed This Visit    None    Visit Diagnoses    Vaginal discharge    -  Primary   Relevant Orders   WET PREP FOR TRICH, YEAST, CLUE   Malodorous urine       Relevant Orders   Urinalysis w microscopic + reflex cultur   Urinary frequency       Relevant Orders   Urinalysis,Complete w/RFL Culture     Plan: Reassurance provided on negative wet prep, exam, and UA. Urine culture pending. Will triage results. She is agreeable to plan.      Tamela Gammon [facility], 12:23 PM 06/29/2020

## 2020-06-29 NOTE — Addendum Note (Signed)
Addended by: Joaquin Music on: 06/29/2020 12:32 PM   Modules accepted: Orders

## 2020-07-12 ENCOUNTER — Ambulatory Visit: Payer: Medicare PPO | Admitting: Obstetrics and Gynecology

## 2020-07-12 ENCOUNTER — Other Ambulatory Visit: Payer: Self-pay

## 2020-07-12 ENCOUNTER — Encounter: Payer: Self-pay | Admitting: Obstetrics and Gynecology

## 2020-07-12 VITALS — BP 130/70 | Ht <= 58 in | Wt 93.0 lb

## 2020-07-12 DIAGNOSIS — M810 Age-related osteoporosis without current pathological fracture: Secondary | ICD-10-CM | POA: Diagnosis not present

## 2020-07-12 DIAGNOSIS — Z01419 Encounter for gynecological examination (general) (routine) without abnormal findings: Secondary | ICD-10-CM | POA: Diagnosis not present

## 2020-07-12 DIAGNOSIS — Z853 Personal history of malignant neoplasm of breast: Secondary | ICD-10-CM | POA: Diagnosis not present

## 2020-07-12 NOTE — Progress Notes (Signed)
   April [name] 05/04/1935 [id number]  SUBJECTIVE:  85 y.o. G0P0 female here for a breast and pelvic exam. She has no gynecologic concerns.   Current Outpatient Medications  Medication Sig Dispense Refill  . cephALEXin (KEFLEX) 250 MG capsule Take 250 mg by mouth daily. For three months    . polyethylene glycol (MIRALAX / GLYCOLAX) 17 g packet Take 17 g 1-3 times a day as needed for constipation 42 each 0  . traMADol (ULTRAM) 50 MG tablet Take 0.5 tablets (25 mg total) by mouth every 12 (twelve) hours as needed. 30 tablet 2   No current facility-administered medications for this visit.   Allergies: Codeine and Levofloxacin  Patient's last menstrual period was 03/26/1979.  Past medical history,surgical history, problem list, medications, allergies, family history and social history were all reviewed and documented as reviewed in the EPIC chart.  GYN ROS: no abnormal bleeding, pelvic pain or discharge, no breast pain or new or enlarging lumps on self exam.  No dysuria, frequency, burning, pain with urination, cloudy/malodorous urine.   OBJECTIVE:  BP 130/70   Ht 4\' 10"  (1.473 m)   Wt 93 lb (42.2 kg)   LMP 03/26/1979   BMI 19.44 kg/m  The patient appears well, alert, oriented, in no distress.  BREAST EXAM: Previous bilateral mastectomy, no suspicious masses, no skin changes or axillary nodes  PELVIC EXAM: VULVA: normal appearing vulva with no masses, tenderness or lesions, VAGINA: normal appearing vagina with normal color and discharge, no lesions, CERVIX: surgically absent, UTERUS: surgically absent, vaginal cuff normal, ADNEXA: no masses, nontender  Chaperone: [name] present during the examination  ASSESSMENT:  85 y.o. G0P0 here for a breast and pelvic exam  PLAN:   1. Postmenopausal. Prior TAH/BSO.  No significant hot flashes or night sweats.  No vaginal bleeding.  Self catheterizing due to bladder dysfunction per urology's directions and using prophylactic  antibiotics per them as well.   2. Pap smear 2011.  No significant history of abnormal Pap smears.  We both agree to not continue with screening following the current guidelines based on her criteria. 3.  History of breast cancer and prior bilateral mastectomy. Normal breast exam today/NED.  4. Colonoscopy >10 yrs ago.  She will follow up at the interval recommended by primary care doctor. 5. Osteoporosis.  DEXA 05/2018 with T score -3.0.  Had been on Prolia for 6 years.  Actively being followed by oncology for CLL which she says in currently in remission and plans to follow up with them in March.  Next DEXA recommended now so she plans to schedule this. 6. Health maintenance.  No labs today as she normally has these completed elsewhere.  Return annually or sooner, prn.  Joseph Pierini MD 07/12/20

## 2020-07-20 ENCOUNTER — Other Ambulatory Visit: Payer: Self-pay

## 2020-07-20 ENCOUNTER — Emergency Department (HOSPITAL_COMMUNITY)
Admission: EM | Admit: 2020-07-20 | Discharge: 2020-07-20 | Disposition: A | Discharge status: Home / Self Care | Payer: Medicare PPO | Attending: Emergency Medicine | Admitting: Emergency Medicine

## 2020-07-20 ENCOUNTER — Emergency Department (HOSPITAL_COMMUNITY): Payer: Medicare PPO

## 2020-07-20 ENCOUNTER — Encounter (HOSPITAL_COMMUNITY): Payer: Self-pay | Admitting: Emergency Medicine

## 2020-07-20 DIAGNOSIS — S0990XA Unspecified injury of head, initial encounter: Secondary | ICD-10-CM | POA: Diagnosis not present

## 2020-07-20 DIAGNOSIS — S4992XA Unspecified injury of left shoulder and upper arm, initial encounter: Secondary | ICD-10-CM | POA: Diagnosis present

## 2020-07-20 DIAGNOSIS — W108XXA Fall (on) (from) other stairs and steps, initial encounter: Secondary | ICD-10-CM | POA: Insufficient documentation

## 2020-07-20 DIAGNOSIS — I10 Essential (primary) hypertension: Secondary | ICD-10-CM | POA: Diagnosis not present

## 2020-07-20 DIAGNOSIS — Z9013 Acquired absence of bilateral breasts and nipples: Secondary | ICD-10-CM | POA: Insufficient documentation

## 2020-07-20 DIAGNOSIS — Z853 Personal history of malignant neoplasm of breast: Secondary | ICD-10-CM | POA: Insufficient documentation

## 2020-07-20 DIAGNOSIS — Z87891 Personal history of nicotine dependence: Secondary | ICD-10-CM | POA: Insufficient documentation

## 2020-07-20 DIAGNOSIS — S42202A Unspecified fracture of upper end of left humerus, initial encounter for closed fracture: Secondary | ICD-10-CM | POA: Insufficient documentation

## 2020-07-20 DIAGNOSIS — Z043 Encounter for examination and observation following other accident: Secondary | ICD-10-CM | POA: Diagnosis not present

## 2020-07-20 MED ORDER — OXYCODONE-ACETAMINOPHEN 5-325 MG PO TABS
1.0000 | ORAL_TABLET | Freq: Four times a day (QID) | ORAL | 0 refills | Status: AC | PRN
Start: 2020-07-20 — End: 2020-07-23

## 2020-07-20 MED ORDER — OXYCODONE-ACETAMINOPHEN 5-325 MG PO TABS
1.0000 | ORAL_TABLET | Freq: Once | ORAL | Status: AC
  Administered 2020-07-20: 18:00:00 1 via ORAL
  Filled 2020-07-20: qty 1

## 2020-07-20 NOTE — Progress Notes (Addendum)
CSW received a call from pt's RN stating pt requesting info on 24-hour care.  CSW spoke to pt/pt's husband an pt requested and was provided with information for a PCA service and provded verbal permission for the CSW to provide her clinicals to Corliss Blacker of [facility] who will follow up with the pt at 10am on 12/23 to set an appointment for an assessment.  CSW spoke to Mr. Henderson at ph: [phone] who requested CSW  fax to ph: [phone] meds and the pt's H&P.  Mr. Koleen Nimrod stated if the pt was not seen as needing [place] services at D/C that his company who is used to working with the pt's PCP Dr. Deniece Ree, then this couldbe arranged later by the pt's PCP at the recommendation of the pt's PCP.  CSW faxed.  CSW will continue to follow for D/C needs.  Alphonse Guild. Arraya Buck  MSW, LCSW, LCAS, CCS Transitions of Care Clinical Social Worker Care Coordination Department Ph: [phone]

## 2020-07-20 NOTE — ED Notes (Signed)
Contacted Social Worker about pt's concern for caring for herself at home.  Social Worker agreed to speak with the wife/husband.  Discharge Pending until plan is finalized.  Will continue to monitor.

## 2020-07-20 NOTE — Discharge Instructions (Addendum)
Take Tylenol or Motrin for pain control.  For breakthrough pain can take the prescribed Percocet.  Note this can make you drowsy and should not be taken while driving or operating heavy machinery.  Continue in sling, recommend no weightbearing in your left arm until further instructed by orthopedics.  Call orthopedic office tomorrow morning to schedule a close follow-up appointment.

## 2020-07-20 NOTE — ED Notes (Signed)
Report received from Monique, RN.  Assumed role as primary nurse at this time.  Pt resting quietly in bed at this time.  NADN.  Will continue to monitor. 

## 2020-07-20 NOTE — ED Triage Notes (Signed)
Patient fell while carrying groceries, endorses hitting her head and L shoulder, obvious deformity noted. Radial pulses intact.

## 2020-07-20 NOTE — ED Notes (Signed)
Ortho made aware patient needs sling applied.

## 2020-07-20 NOTE — ED Provider Notes (Addendum)
[facility] DEPT Provider Note   CSN: [id number] Arrival date & time: 07/20/20  1702     History No chief complaint on file.   April [name] is a 85 y.o. female.  85 year old lady presenting to ER with concern for fall.  Patient reports tripped while going down steps.  Landed on her left side, left shoulder.  Having severe pain in her left shoulder, worse with movement, improved with rest.  No associated numbness or weakness.  Also believes that she hit her head.  No loss of consciousness, no nausea or vomiting.  No headache currently.  No neck pain or back pain.  HPI     Past Medical History:  Diagnosis Date  . Breast CA ([place])   . Bronchitis, chronic ([place])   . Compression fracture of body of thoracic vertebra ([facility])   . Eye hemorrhage, left   . Leukemia, chronic lymphoid ([place])   . 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 Osteoporosis 05/2018   T score -3.1 overall stable from prior study    Patient Active Problem List   Diagnosis Date Noted  . Vitamin D deficiency 03/09/2020  . Transaminitis 03/09/2020  . Bladder distension 10/13/2019  . Weight loss, abnormal 09/30/2019  . Acute metabolic encephalopathy 78/29/5621  . Other constipation 06/18/2019  . Physical debility 06/18/2019  . Abdominal distension 06/08/2019  . Severe pain 06/08/2019  . Intractable back pain 06/08/2019  . Closed wedge compression fracture of T8 vertebra ([place]) 06/07/2019  . Sinus tachycardia 06/07/2019  . Intractable pain 06/07/2019  . Goals of care, counseling/discussion 05/14/2019  . Abnormal liver enzymes 05/14/2019  . Dehydration 05/14/2019  . Failure to thrive in adult 05/14/2019  . Abdominal pain 04/16/2019  . Cancer associated pain 04/16/2019  . Breast cancer ([place]) 06/19/2018  . Skin lesion 05/06/2017  . Essential hypertension 12/14/2016  . Systolic hypertension, isolated 11/16/2016  . Central retinal vein occlusion 10/18/2016  .  Preventive measure 04/28/2015  . Thrombocytopenia ([place]) 04/28/2015  . Idiopathic scoliosis 10/23/2012  . CLL (chronic lymphocytic leukemia) ([place]) 03/02/2009  . VENOUS INSUFFICIENCY, CHRONIC 10/28/2008  . Dyslipidemia 10/27/2007  . Osteoporosis 10/27/2007  . SKIN CANCER, HX OF 10/27/2007  . DIVERTICULITIS, HX OF 04/08/2007    Past Surgical History:  Procedure Laterality Date  . ABDOMINAL HYSTERECTOMY  1980  . BREAST IMPLANTS REMOVED  2001  . BREAST SURGERY     Bilateral mastectomy  . IR RADIOLOGIST EVAL & MGMT  07/01/2019  . MASTECTOMY  1982   BILATERAL  . OOPHORECTOMY     BSO  . PARTIAL COLECTOMY     INTESTINAL ABSCESS WITH SALPINGECTOMY  . RECONSTRUCTION LEFT ELBOW    . TONSILLECTOMY    . UMBILLICAL HERNIA REPAIR       OB History    Gravida  0   Para  0   Term      Preterm      AB      Living        SAB      IAB      Ectopic      Multiple      Live Births              Family History  Problem Relation Age of Onset  . Cancer Brother        HODGKINS  . Cancer Brother        leukemia  . Parkinson's disease Brother   .  Heart disease Mother   . Heart disease Sister     Social History   Tobacco Use  . Smoking status: Former Smoker    Types: Cigarettes    Quit date: 03/26/1955    Years since quitting: 65.3  . Smokeless tobacco: Never Used  Vaping Use  . Vaping Use: Never used  Substance Use Topics  . Alcohol use: Never  . Drug use: No    Home Medications Prior to Admission medications   Medication Sig Start Date End Date Taking? Authorizing Provider  cephALEXin (KEFLEX) 250 MG capsule Take 250 mg by mouth daily. For three months   Yes [provider]  traMADol (ULTRAM) 50 MG tablet Take 0.5 tablets (25 mg total) by mouth every 12 (twelve) hours as needed. Patient taking differently: Take 25 mg by mouth every 12 (twelve) hours as needed for moderate pain. 06/14/20  Yes Erline Hau, MD    Allergies    Codeine  and Levofloxacin  Review of Systems   Review of Systems  Constitutional: Negative for chills and fever.  HENT: Negative for ear pain and sore throat.   Eyes: Negative for pain and visual disturbance.  Respiratory: Negative for cough and shortness of breath.   Cardiovascular: Negative for chest pain and palpitations.  Gastrointestinal: Negative for abdominal pain and vomiting.  Genitourinary: Negative for dysuria and hematuria.  Musculoskeletal: Positive for arthralgias. Negative for back pain.  Skin: Negative for color change and rash.  Neurological: Negative for seizures and syncope.  All other systems reviewed and are negative.   Physical Exam Updated Vital Signs BP (!) 171/94   Pulse 79   Temp 97.9 F (36.6 C) (Oral)   Resp 16   Ht 4\' 10"  (1.473 m)   Wt 42 kg   LMP 03/26/1979   SpO2 98%   BMI 19.35 kg/m   Physical Exam Vitals and nursing note reviewed.  Constitutional:      General: She is not in acute distress.    Appearance: She is well-developed and well-nourished.  HENT:     Head: Normocephalic and atraumatic.  Eyes:     Conjunctiva/sclera: Conjunctivae normal.  Neck:     Comments: No neck TTP Cardiovascular:     Rate and Rhythm: Normal rate and regular rhythm.     Heart sounds: No murmur heard.   Pulmonary:     Effort: Pulmonary effort is normal. No respiratory distress.     Breath sounds: Normal breath sounds.  Abdominal:     Palpations: Abdomen is soft.     Tenderness: There is no abdominal tenderness.  Musculoskeletal:        General: No edema.     Cervical back: Neck supple.     Comments: LUE: Tenderness to palpation over left shoulder, decreased ROM, normal sensation throughout, normal radial pulse RUE: no deformity or TTP LLE:  no deformity or TTP RLE: mild ttp over hip, no deformity Back: no TTP or deformity  Skin:    General: Skin is warm and dry.  Neurological:     Mental Status: She is alert.  Psychiatric:        Mood and Affect:  Mood and affect normal.     ED Results / Procedures / Treatments   Labs (all labs ordered are listed, but only abnormal results are displayed) Labs Reviewed - No data to display  EKG None  Radiology CT Head Wo Contrast  Result Date: 07/20/2020 CLINICAL DATA:  [place] while carrying groceries, hit  head and left shoulder EXAM: CT HEAD WITHOUT CONTRAST TECHNIQUE: Contiguous axial images were obtained from the base of the skull through the vertex without intravenous contrast. COMPARISON:  07/09/2019 FINDINGS: Brain: No acute infarct or hemorrhage. The hypodensities within the periventricular white matter consistent with chronic small vessel ischemic changes. Lateral ventricles and midline structures are otherwise unremarkable. No acute extra-axial fluid collections. No mass effect. Vascular: No hyperdense vessel or unexpected calcification. Skull: Normal. Negative for fracture or focal lesion. Sinuses/Orbits: No acute finding. Other: None. IMPRESSION: 1. No acute intracranial process. Electronically Signed   By: Randa Ngo M.D.   On: 07/20/2020 19:17   DG Shoulder Left  Result Date: 07/20/2020 CLINICAL DATA:  Status post fall. EXAM: LEFT SHOULDER - 2+ VIEW COMPARISON:  None. FINDINGS: Acute fracture deformity is seen involving the head and surgical neck of the proximal left humerus. Approximately 1/2 shaft width medial displacement of the distal fracture site is seen. There is no evidence of dislocation. Moderate severity lateral soft tissue swelling is seen. IMPRESSION: Acute fracture of the proximal left humerus. Electronically Signed   By: Virgina Norfolk M.D.   On: 07/20/2020 19:33   DG Hip Unilat W or Wo Pelvis 2-3 Views Left  Result Date: 07/20/2020 CLINICAL DATA:  Status post fall. EXAM: DG HIP (WITH OR WITHOUT PELVIS) 2-3V LEFT COMPARISON:  None. FINDINGS: There is no evidence of hip fracture or dislocation. Mild to moderate severity degenerative changes are seen in the form of joint  space narrowing and acetabular sclerosis. IMPRESSION: No acute osseous abnormality. Electronically Signed   By: Virgina Norfolk M.D.   On: 07/20/2020 19:34    Procedures Procedures (including critical care time)  Medications Ordered in ED Medications  oxyCODONE-acetaminophen (PERCOCET/ROXICET) 5-325 MG per tablet 1 tablet (1 tablet Oral Given 07/20/20 1828)    ED Course  I have reviewed the triage vital signs and the nursing notes.  Pertinent labs & imaging results that were available during my care of the patient were reviewed by me and considered in my medical decision making (see chart for details).    MDM Rules/Calculators/A&P                         85 year old lady presenting to ER after mechanical fall.  CT head negative, plain films concerning for proximal humerus fracture.  Neurovascularly intact.  Will place in sling, recommended nonweightbearing, instructed patient to have close follow-up with Ortho.  DC home with husband.   After the discussed management above, the patient was determined to be safe for discharge.  The patient was in agreement with this plan and all questions regarding their care were answered.  ED return precautions were discussed and the patient will return to the ED with any significant worsening of condition.  Final Clinical Impression(s) / ED Diagnoses Final diagnoses:  Closed fracture of proximal end of left humerus, unspecified fracture morphology, initial encounter    Rx / DC Orders ED Discharge Orders    None       Lucrezia Starch, MD 07/20/20 1940    Lucrezia Starch, MD 07/20/20 1940

## 2020-07-27 ENCOUNTER — Ambulatory Visit: Payer: Medicare PPO | Admitting: Orthopaedic Surgery

## 2020-07-27 ENCOUNTER — Ambulatory Visit: Payer: Self-pay

## 2020-07-27 ENCOUNTER — Encounter: Payer: Self-pay | Admitting: Orthopaedic Surgery

## 2020-07-27 DIAGNOSIS — M545 Low back pain, unspecified: Secondary | ICD-10-CM

## 2020-07-27 DIAGNOSIS — S42292A Other displaced fracture of upper end of left humerus, initial encounter for closed fracture: Secondary | ICD-10-CM

## 2020-07-27 MED ORDER — ONDANSETRON HCL 4 MG PO TABS: 4.0000 mg | ORAL_TABLET | Freq: Three times a day (TID) | ORAL | 0 refills | Status: DC | PRN

## 2020-07-27 MED ORDER — TRAMADOL HCL 50 MG PO TABS: ORAL_TABLET | ORAL | 0 refills | Status: DC

## 2020-07-27 NOTE — Progress Notes (Signed)
Office Visit Note   Patient: April [name]           Date of Birth: 05/26/1935           MRN: [id number] Visit Date: 07/27/2020              Requested by: Isaac Bliss, Rayford Halsted, MD [place],   [id number] PCP: Isaac Bliss, Rayford Halsted, MD   Assessment & Plan: Visit Diagnoses:  1. Acute bilateral low back pain without sciatica   2. Other closed displaced fracture of proximal end of left humerus, initial encounter     Plan: Impression is displaced left proximal humerus fracture and lower back contusion.  In regards to the humerus fracture, we will continue to treat this nonoperatively due to her age and low demand and she will continue wearing the sling for another 2 weeks.  Follow-up with [place] at that point in time for repeat evaluation and AP and scapular Y x-rays.  In regards to her lumbar spine I recommended rest, ice and heat.  I have also called in tramadol to take as needed.  Call with concerns or questions.  Follow-Up Instructions: Return in about 2 weeks (around 08/10/2020).   Orders:  Orders Placed This Encounter  Procedures  . XR Lumbar Spine 2-3 Views  . XR Shoulder Left   Meds ordered this encounter  Medications  . traMADol (ULTRAM) 50 MG tablet    Sig: Take 1/2 tablet po bid prn pain    Dispense:  30 tablet    Refill:  0  . ondansetron (ZOFRAN) 4 MG tablet    Sig: Take 1 tablet (4 mg total) by mouth every 8 (eight) hours as needed for nausea or vomiting.    Dispense:  40 tablet    Refill:  0      Procedures: No procedures performed   Clinical Data: No additional findings.   Subjective: Chief Complaint  Patient presents with  . Left Shoulder - Pain  . Lower Back - Pain    HPI patient is a very pleasant 85 year old female who comes in today following an injury to the left arm and back.  Proximately 6 days ago, she was walking in her yard when she sustained a mechanical fall landing on her back and left side.  She was  seen in the ED where it was noted that she had a left proximal humerus fracture.  She was placed in a sling.  She comes in today for further evaluation treatment recommendation.  She is also having pain to her lower lumbar spine.  She does have a history of previous compression fractures there.  Currently, her pain is worse with walking.  She denies any numbness, tingling or burning to either lower extremity.  No new weakness.  No new bowel or bladder dysfunction or saddle paresthesias.  Review of Systems as detailed in HPI.  All others reviewed and are negative.   Objective: Vital Signs: LMP 03/26/1979   Physical Exam well-developed well-nourished female in no acute distress.  Alert and oriented x3.  Ortho Exam left shoulder exam shows marked ecchymosis throughout.  Moderate tenderness to the proximal humerus.  She is neurovascularly intact distally.  Lumbar spine shows no spinous or paraspinous tenderness.  Negative straight leg raise.  No increased pain with lumbar flexion or extension.  No focal weakness.  She is neurovascular intact distally.  Specialty Comments:  No specialty comments available.  Imaging: XR Lumbar Spine 2-3 Views  Result Date: 07/27/2020 X-rays demonstrate multilevel spondylosis and degenerative scoliosis with old compression fractures at L3 and L4.  No acute findings.  XR Shoulder Left  Result Date: 07/27/2020 Mildly displaced proximal humerus fracture    PMFS History: Patient Active Problem List   Diagnosis Date Noted  . Vitamin D deficiency 03/09/2020  . Transaminitis 03/09/2020  . Bladder distension 10/13/2019  . Weight loss, abnormal 09/30/2019  . Acute metabolic encephalopathy 07/15/2019  . Other constipation 06/18/2019  . Physical debility 06/18/2019  . Abdominal distension 06/08/2019  . Severe pain 06/08/2019  . Intractable back pain 06/08/2019  . Closed wedge compression fracture of T8 vertebra (HCC) 06/07/2019  . Sinus tachycardia 06/07/2019   . Intractable pain 06/07/2019  . Goals of care, counseling/discussion 05/14/2019  . Abnormal liver enzymes 05/14/2019  . Dehydration 05/14/2019  . Failure to thrive in adult 05/14/2019  . Abdominal pain 04/16/2019  . Cancer associated pain 04/16/2019  . Breast cancer (HCC) 06/19/2018  . Skin lesion 05/06/2017  . Essential hypertension 12/14/2016  . Systolic hypertension, isolated 11/16/2016  . Central retinal vein occlusion 10/18/2016  . Preventive measure 04/28/2015  . Thrombocytopenia (HCC) 04/28/2015  . Idiopathic scoliosis 10/23/2012  . CLL (chronic lymphocytic leukemia) (HCC) 03/02/2009  . VENOUS INSUFFICIENCY, CHRONIC 10/28/2008  . Dyslipidemia 10/27/2007  . Osteoporosis 10/27/2007  . SKIN CANCER, HX OF 10/27/2007  . DIVERTICULITIS, HX OF 04/08/2007   Past Medical History:  Diagnosis Date  . Breast CA (HCC)   . Bronchitis, chronic (HCC)   . Compression fracture of body of thoracic vertebra (HCC)   . Eye hemorrhage, left   . Leukemia, chronic lymphoid (HCC)   . 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HCC)    INCREASED RISK FOR LYMPHOMA  . Osteoporosis 05/2018   T score -3.1 overall stable from prior study    Family History  Problem Relation Age of Onset  . Cancer Brother        HODGKINS  . Cancer Brother        leukemia  . Parkinson's disease Brother   . Heart disease Mother   . Heart disease Sister     Past Surgical History:  Procedure Laterality Date  . ABDOMINAL HYSTERECTOMY  1980  . BREAST IMPLANTS REMOVED  2001  . BREAST SURGERY     Bilateral mastectomy  . IR RADIOLOGIST EVAL & MGMT  07/01/2019  . MASTECTOMY  1982   BILATERAL  . OOPHORECTOMY     BSO  . PARTIAL COLECTOMY     INTESTINAL ABSCESS WITH SALPINGECTOMY  . RECONSTRUCTION LEFT ELBOW    . TONSILLECTOMY    . UMBILLICAL HERNIA REPAIR     Social History   Occupational History  . Not on file  Tobacco Use  . Smoking status: Former Smoker    Types: Cigarettes    Quit date: 03/26/1955    Years since  quitting: 65.3  . Smokeless tobacco: Never Used  Vaping Use  . Vaping Use: Never used  Substance and Sexual Activity  . Alcohol use: Never  . Drug use: No  . Sexual activity: Never    Partners: Male    Birth control/protection: Surgical    Comment: HYST-1st intercourse 85 yo-More than 5 partners

## 2020-07-28 DIAGNOSIS — R339 Retention of urine, unspecified: Secondary | ICD-10-CM | POA: Diagnosis not present

## 2020-08-02 ENCOUNTER — Other Ambulatory Visit: Payer: Self-pay

## 2020-08-02 ENCOUNTER — Encounter: Payer: Self-pay | Admitting: Internal Medicine

## 2020-08-02 ENCOUNTER — Ambulatory Visit: Payer: Medicare PPO | Admitting: Internal Medicine

## 2020-08-02 VITALS — BP 140/80 | HR 103 | Temp 98.7°F | Wt 93.0 lb

## 2020-08-02 DIAGNOSIS — R197 Diarrhea, unspecified: Secondary | ICD-10-CM

## 2020-08-02 DIAGNOSIS — S42215D Unspecified nondisplaced fracture of surgical neck of left humerus, subsequent encounter for fracture with routine healing: Secondary | ICD-10-CM

## 2020-08-02 DIAGNOSIS — M545 Low back pain, unspecified: Secondary | ICD-10-CM | POA: Diagnosis not present

## 2020-08-02 DIAGNOSIS — Z9181 History of falling: Secondary | ICD-10-CM | POA: Diagnosis not present

## 2020-08-02 DIAGNOSIS — I83893 Varicose veins of bilateral lower extremities with other complications: Secondary | ICD-10-CM | POA: Diagnosis not present

## 2020-08-02 NOTE — Progress Notes (Signed)
Acute Office Visit     This visit occurred during the SARS-CoV-2 public health emergency.  Safety protocols were in place, including screening questions prior to the visit, additional usage of staff PPE, and extensive cleaning of exam room while observing appropriate contact time as indicated for disinfecting solutions.    CC/Reason for Visit: discuss some acute concerns  HPI: April [name] is a 86 y.o. female who is coming in today for the above mentioned reasons. She suffered a fall at home 3 weeks ago while going up the steps falling straight back onto her lower back and left arm. She suffered a left humeral fracture and is wearing a sling. Has followed with ortho and returns in 2 weeks for repeat xrays. Non-operative management was recommended. Back xrays were negative per report. She has a h/o chronic back pain with vertebral compression fractures. Has been on keflex for her recurrent UTIs and has been having some diarrhea for 3 weeks, stools have been loose about 1-3 episodes per day. She has still been using laxatives. She has also been having some bilateral ankle edema. She has been using tramadol for her back pain.   Past Medical/Surgical History: Past Medical History:  Diagnosis Date  . Breast CA ([place])   . Bronchitis, chronic ([place])   . Compression fracture of body of thoracic vertebra (HCC)   . Eye hemorrhage, left   . Leukemia, chronic lymphoid ([place])   . 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[place])    INCREASED RISK FOR LYMPHOMA  . Osteoporosis 05/2018   T score -3.1 overall stable from prior study    Past Surgical History:  Procedure Laterality Date  . ABDOMINAL HYSTERECTOMY  1980  . BREAST IMPLANTS REMOVED  2001  . BREAST SURGERY     Bilateral mastectomy  . IR RADIOLOGIST EVAL & MGMT  07/01/2019  . MASTECTOMY  1982   BILATERAL  . OOPHORECTOMY     BSO  . PARTIAL COLECTOMY     INTESTINAL ABSCESS WITH SALPINGECTOMY  . RECONSTRUCTION LEFT ELBOW    . TONSILLECTOMY    .  UMBILLICAL HERNIA REPAIR      Social History:  reports that she quit smoking about 65 years ago. Her smoking use included cigarettes. She has never used smokeless tobacco. She reports that she does not drink alcohol and does not use drugs.  Allergies: Allergies  Allergen Reactions  . Codeine Nausea Only  . Levofloxacin Rash    Family History:  Family History  Problem Relation Age of Onset  . Cancer Brother        HODGKINS  . Cancer Brother        leukemia  . Parkinson's disease Brother   . Heart disease Mother   . Heart disease Sister      Current Outpatient Medications:  .  cephALEXin (KEFLEX) 250 MG capsule, Take 250 mg by mouth daily. For three months, Disp: , Rfl:  .  ondansetron (ZOFRAN) 4 MG tablet, Take 1 tablet (4 mg total) by mouth every 8 (eight) hours as needed for nausea or vomiting., Disp: 40 tablet, Rfl: 0 .  traMADol (ULTRAM) 50 MG tablet, Take 1/2 tablet po bid prn pain, Disp: 30 tablet, Rfl: 0  Review of Systems:  Constitutional: Denies fever, chills, diaphoresis, appetite change and fatigue.  HEENT: Denies photophobia, eye pain, redness, hearing loss, ear pain, congestion, sore throat, rhinorrhea, sneezing, mouth sores, trouble swallowing, neck pain, neck stiffness and tinnitus.   Respiratory: Denies SOB, DOE, cough, chest tightness,  and wheezing.   Cardiovascular: Denies chest pain, palpitations and leg swelling.  Gastrointestinal: Denies nausea, vomiting, abdominal pain,  constipation, blood in stool and abdominal distention.  Genitourinary: Denies dysuria, urgency, frequency, hematuria, flank pain and difficulty urinating.  Endocrine: Denies: hot or cold intolerance, sweats, changes in hair or nails, polyuria, polydipsia. Musculoskeletal: Denies myalgias, joint swelling, arthralgias and gait problem.  Skin: Denies pallor, rash and wound.  Neurological: Denies dizziness, seizures, syncope, weakness, light-headedness, numbness and headaches.   Hematological: Denies adenopathy. Easy bruising, personal or family bleeding history  Psychiatric/Behavioral: Denies suicidal ideation, mood changes, confusion, nervousness, sleep disturbance and agitation    Physical Exam: Vitals:   08/02/20 1145  BP: 140/80  Pulse: (!) 103  Temp: 98.7 F (37.1 C)  TempSrc: Oral  SpO2: 99%  Weight: 93 lb (42.2 kg)    Body mass index is 19.44 kg/m.   Constitutional: NAD, calm, comfortable Eyes: PERRL, lids and conjunctivae normal ENMT: Mucous membranes are moist.  Respiratory: clear to auscultation bilaterally, no wheezing, no crackles. Normal respiratory effort. No accessory muscle use.  Cardiovascular: Regular rate and rhythm, no murmurs / rubs / gallops. No extremity edema. Psychiatric: Normal judgment and insight. Alert and oriented x 3. Normal mood.    Impression and Plan:  History of fall Acute midline low back pain without sciatica Closed nondisplaced fracture of surgical neck of left humerus with routine healing, unspecified fracture morphology, subsequent encounter -Following with ortho. -continue tramadol.  Varicose veins of leg with edema, bilateral -Advised compression stockings and leg elevation when able.  Diarrhea, unspecified type -Suspect due to chronic abx use for recurrent UTIs. -Advised PRN use of imodium and she knows to notify if worsens.    Chaya Jan, MD [facility] at [facility]

## 2020-08-16 ENCOUNTER — Ambulatory Visit: Payer: Medicare PPO | Admitting: Orthopaedic Surgery

## 2020-08-16 ENCOUNTER — Other Ambulatory Visit: Payer: Self-pay

## 2020-08-17 ENCOUNTER — Other Ambulatory Visit: Payer: Self-pay

## 2020-08-18 DIAGNOSIS — R8271 Bacteriuria: Secondary | ICD-10-CM | POA: Diagnosis not present

## 2020-08-18 DIAGNOSIS — N3000 Acute cystitis without hematuria: Secondary | ICD-10-CM | POA: Diagnosis not present

## 2020-08-22 ENCOUNTER — Encounter: Payer: Self-pay | Admitting: Family Medicine

## 2020-08-22 ENCOUNTER — Telehealth: Payer: Self-pay | Admitting: Internal Medicine

## 2020-08-22 ENCOUNTER — Telehealth (INDEPENDENT_AMBULATORY_CARE_PROVIDER_SITE_OTHER): Payer: Medicare PPO | Admitting: Family Medicine

## 2020-08-22 DIAGNOSIS — M549 Dorsalgia, unspecified: Secondary | ICD-10-CM

## 2020-08-22 MED ORDER — OXYCODONE-ACETAMINOPHEN 5-325 MG PO TABS
1.0000 | ORAL_TABLET | Freq: Four times a day (QID) | ORAL | 0 refills | Status: AC | PRN
Start: 2020-08-22 — End: 2020-08-27

## 2020-08-22 NOTE — Telephone Encounter (Signed)
Pt is calling in stating that her spouse is at the pharmacy and would like to see if the Rx can be sent in.  Pt is aware that the system that they are using to send in the Rx is down and will send in as soon possible.

## 2020-08-22 NOTE — Progress Notes (Signed)
   Subjective:    Patient ID: April [name], female    DOB: [birth year], 86 y.o.   MRN: [id number]  HPI Virtual Visit via Telephone Note  I connected with the patient on 08/22/20 at  8:45 AM EST by telephone and verified that I am speaking with the correct person using two identifiers.   I discussed the limitations, risks, security and privacy concerns of performing an evaluation and management service by telephone and the availability of in person appointments. I also discussed with the patient that there may be a patient responsible charge related to this service. The patient expressed understanding and agreed to proceed.  Location patient: home Location provider: work or home office Participants present for the call: patient, provider Patient did not have a visit in the prior 7 days to address this/these issue(s).   History of Present Illness: Here for severe low back pain that radiates down the right leg. She has a long hx of low back pain with vertebral compression fractures. She also has chronic UTI's. She had been on Keflex for 2 months per Dr. Claudia Desanctis at Urology, and they saw her last week. They obtained a urine culture which is pending, and she expects to hear for them today. She has tried Tramadol for the back pain with no relief. No fever.    Observations/Objective: Patient sounds cheerful and well on the phone. I do not appreciate any SOB. Speech and thought processing are grossly intact. Patient reported vitals:  Assessment and Plan: Low back pain, treat with Percocet 5-325 as needed. Follow up with Urology as above.  Alysia Penna, MD   Follow Up Instructions:     [phone] 5-10 [phone] 11-20 [id number] 21-30 I did not refer this patient for an OV in the next 24 hours for this/these issue(s).  I discussed the assessment and treatment plan with the patient. The patient was provided an opportunity to ask questions and all were answered. The patient agreed with the plan and  demonstrated an understanding of the instructions.   The patient was advised to call back or seek an in-person evaluation if the symptoms worsen or if the condition fails to improve as anticipated.  I provided 12 minutes of non-face-to-face time during this encounter.   Alysia Penna, MD    Review of Systems     Objective:   Physical Exam        Assessment & Plan:

## 2020-08-23 NOTE — Telephone Encounter (Signed)
Called to speak with Patient she stated she has received prescription.

## 2020-08-24 ENCOUNTER — Other Ambulatory Visit: Payer: Self-pay

## 2020-08-24 ENCOUNTER — Encounter: Payer: Self-pay | Admitting: Internal Medicine

## 2020-08-24 ENCOUNTER — Other Ambulatory Visit (INDEPENDENT_AMBULATORY_CARE_PROVIDER_SITE_OTHER): Payer: Medicare PPO

## 2020-08-24 DIAGNOSIS — R7989 Other specified abnormal findings of blood chemistry: Secondary | ICD-10-CM | POA: Diagnosis not present

## 2020-08-24 LAB — VITAMIN D 25 HYDROXY (VIT D DEFICIENCY, FRACTURES): VITD: 40.1 ng/mL (ref 30.00–100.00)

## 2020-08-24 NOTE — Addendum Note (Signed)
Addended by: Tessie Fass D on: 08/24/2020 12:57 PM   Modules accepted: Orders

## 2020-08-25 ENCOUNTER — Telehealth (INDEPENDENT_AMBULATORY_CARE_PROVIDER_SITE_OTHER): Payer: Medicare PPO | Admitting: Internal Medicine

## 2020-08-25 ENCOUNTER — Encounter: Payer: Self-pay | Admitting: Internal Medicine

## 2020-08-25 ENCOUNTER — Other Ambulatory Visit: Payer: Self-pay | Admitting: Internal Medicine

## 2020-08-25 VITALS — Wt 90.0 lb

## 2020-08-25 DIAGNOSIS — R197 Diarrhea, unspecified: Secondary | ICD-10-CM | POA: Diagnosis not present

## 2020-08-25 DIAGNOSIS — E559 Vitamin D deficiency, unspecified: Secondary | ICD-10-CM

## 2020-08-25 DIAGNOSIS — M5441 Lumbago with sciatica, right side: Secondary | ICD-10-CM | POA: Diagnosis not present

## 2020-08-25 MED ORDER — VITAMIN D (ERGOCALCIFEROL) 1.25 MG (50000 UNIT) PO CAPS
50000.0000 [IU] | ORAL_CAPSULE | ORAL | 0 refills | Status: AC
Start: 2020-08-25 — End: 2020-11-11

## 2020-08-25 NOTE — Progress Notes (Signed)
Virtual Visit via Telephone Note  I connected with April [name] on 08/25/20 at  4:00 PM EST by telephone and verified that I am speaking with the correct person using two identifiers.   I discussed the limitations, risks, security and privacy concerns of performing an evaluation and management service by telephone and the availability of in person appointments. I also discussed with the patient that there may be a patient responsible charge related to this service. The patient expressed understanding and agreed to proceed.  Location patient: home Location provider: work office Participants present for the call: patient, provider Patient did not have a visit in the prior 7 days to address this/these issue(s).   History of Present Illness:  April [name] has scheduled this visit to discuss 2 main concerns:  1.  For the past 5 days she has had acute onset of right lower side back pain radiating down the back of her buttocks down into the back and side of her thigh to around the knee area.  She has a history of chronic vertebral compression fractures but believes this is a different type of pain.  Her pain had always been midline with her vertebral fractures.  She had a visit with another office provider 3 days ago and was prescribed some Percocet she has been using very sparingly as she has previously had acute metabolic encephalopathy issues while on oxycodone.  She would like to know what to do next.  2.  She continues to have diarrhea.  She is on chronic antibiotics for recurrent UTIs.  She reached out to her urologist and was asked to discontinue her Augmentin.  She had previously been on Keflex.  She wonders what else could be done.   Observations/Objective: Patient sounds quiet on her phone today. I do not appreciate any increased work of breathing. Speech and thought processing are grossly intact. Patient reported vitals: None reported    Current Outpatient Medications:  .   ondansetron (ZOFRAN) 4 MG tablet, Take 1 tablet (4 mg total) by mouth every 8 (eight) hours as needed for nausea or vomiting., Disp: 40 tablet, Rfl: 0 .  oxyCODONE-acetaminophen (PERCOCET) 5-325 MG tablet, Take 1 tablet by mouth every 6 (six) hours as needed for up to 5 days for severe pain., Disp: 20 tablet, Rfl: 0 .  Vitamin D, Ergocalciferol, (DRISDOL) 1.25 MG (50000 UNIT) CAPS capsule, Take 1 capsule (50,000 Units total) by mouth every 7 (seven) days for 12 doses., Disp: 12 capsule, Rfl: 0 .  cephALEXin (KEFLEX) 250 MG capsule, Take 250 mg by mouth daily. For three months (Patient not taking: Reported on 08/25/2020), Disp: , Rfl:  .  traMADol (ULTRAM) 50 MG tablet, Take 1/2 tablet po bid prn pain (Patient not taking: Reported on 08/25/2020), Disp: 30 tablet, Rfl: 0  Review of Systems:  Constitutional: Denies fever, chills, diaphoresis, appetite change and fatigue.  HEENT: Denies photophobia, eye pain, redness, hearing loss, ear pain, congestion, sore throat, rhinorrhea, sneezing, mouth sores, trouble swallowing, neck pain, neck stiffness and tinnitus.   Respiratory: Denies SOB, DOE, cough, chest tightness,  and wheezing.   Cardiovascular: Denies chest pain, palpitations and leg swelling.  Gastrointestinal: Denies nausea, vomiting, abdominal pain, constipation, blood in stool and abdominal distention.  Genitourinary: Denies dysuria, urgency, frequency, hematuria, flank pain and difficulty urinating.  Endocrine: Denies: hot or cold intolerance, sweats, changes in hair or nails, polyuria, polydipsia. Musculoskeletal: Denies  joint swelling. Skin: Denies pallor, rash and wound.  Neurological: Denies dizziness, seizures, syncope,  weakness, light-headedness, numbness and headaches.  Hematological: Denies adenopathy. Easy bruising, personal or family bleeding history  Psychiatric/Behavioral: Denies suicidal ideation, mood changes, confusion, nervousness, sleep disturbance and agitation   Assessment  and Plan:  Acute right-sided low back pain with right-sided sciatica -No precipitating injury, she states this is different from her previous back pains from her vertebral compression fractures, I agree based on her prior presentations. -Suspect this is sciatica given right-sided back pain radiating down the back of her buttock and leg.  I have advised her to continue to use tramadol and use oxycodone only sparingly given her history with it in the past.  Have advised icing, physical therapy, she is declining PT at this time.  Diarrhea, unspecified type -Potential etiologies include antibiotic associated diarrhea, C. difficile colitis and Covid. -She discontinued use of antibiotic at the instruction of urology today, I agree. -Suggest that she get Covid tested, if negative and diarrhea fails to improve over the course of the next 5 days after stopping antibiotic, I would recommend stool sampling for C. difficile.    I discussed the assessment and treatment plan with the patient. The patient was provided an opportunity to ask questions and all were answered. The patient agreed with the plan and demonstrated an understanding of the instructions.   The patient was advised to call back or seek an in-person evaluation if the symptoms worsen or if the condition fails to improve as anticipated.  I provided 23 minutes of non-face-to-face time during this encounter.   Lelon Frohlich, MD [facility] at [facility]

## 2020-08-29 ENCOUNTER — Other Ambulatory Visit: Payer: Medicare PPO

## 2020-08-29 DIAGNOSIS — N3000 Acute cystitis without hematuria: Secondary | ICD-10-CM | POA: Diagnosis not present

## 2020-08-29 DIAGNOSIS — Z20822 Contact with and (suspected) exposure to covid-19: Secondary | ICD-10-CM | POA: Diagnosis not present

## 2020-08-30 LAB — SARS-COV-2, NAA 2 DAY TAT

## 2020-08-30 LAB — NOVEL CORONAVIRUS, NAA: SARS-CoV-2, NAA: NOT DETECTED

## 2020-08-31 ENCOUNTER — Other Ambulatory Visit: Payer: Self-pay

## 2020-08-31 ENCOUNTER — Telehealth: Payer: Self-pay | Admitting: Internal Medicine

## 2020-08-31 ENCOUNTER — Emergency Department (HOSPITAL_COMMUNITY)
Admission: EM | Admit: 2020-08-31 | Discharge: 2020-08-31 | Disposition: A | Discharge status: Home / Self Care | Payer: Medicare PPO | Attending: Emergency Medicine | Admitting: Emergency Medicine

## 2020-08-31 ENCOUNTER — Emergency Department (HOSPITAL_COMMUNITY): Payer: Medicare PPO

## 2020-08-31 ENCOUNTER — Encounter (HOSPITAL_COMMUNITY): Payer: Self-pay | Admitting: Emergency Medicine

## 2020-08-31 DIAGNOSIS — Z87891 Personal history of nicotine dependence: Secondary | ICD-10-CM | POA: Diagnosis not present

## 2020-08-31 DIAGNOSIS — Z85828 Personal history of other malignant neoplasm of skin: Secondary | ICD-10-CM | POA: Insufficient documentation

## 2020-08-31 DIAGNOSIS — R1084 Generalized abdominal pain: Secondary | ICD-10-CM | POA: Diagnosis not present

## 2020-08-31 DIAGNOSIS — Z79899 Other long term (current) drug therapy: Secondary | ICD-10-CM | POA: Insufficient documentation

## 2020-08-31 DIAGNOSIS — R109 Unspecified abdominal pain: Secondary | ICD-10-CM | POA: Diagnosis not present

## 2020-08-31 DIAGNOSIS — I10 Essential (primary) hypertension: Secondary | ICD-10-CM | POA: Diagnosis not present

## 2020-08-31 DIAGNOSIS — R197 Diarrhea, unspecified: Secondary | ICD-10-CM | POA: Diagnosis not present

## 2020-08-31 DIAGNOSIS — R1030 Lower abdominal pain, unspecified: Secondary | ICD-10-CM | POA: Diagnosis present

## 2020-08-31 DIAGNOSIS — Z853 Personal history of malignant neoplasm of breast: Secondary | ICD-10-CM | POA: Diagnosis not present

## 2020-08-31 LAB — COMPREHENSIVE METABOLIC PANEL
ALT: 30 U/L (ref 0–44)
AST: 28 U/L (ref 15–41)
Albumin: 4.1 g/dL (ref 3.5–5.0)
Alkaline Phosphatase: 117 U/L (ref 38–126)
Anion gap: 8 (ref 5–15)
BUN: 10 mg/dL (ref 8–23)
CO2: 25 mmol/L (ref 22–32)
Calcium: 9.3 mg/dL (ref 8.9–10.3)
Chloride: 104 mmol/L (ref 98–111)
Creatinine, Ser: 0.58 mg/dL (ref 0.44–1.00)
GFR, Estimated: >60 mL/min (ref >60)
Glucose, Bld: 108 mg/dL — ABNORMAL HIGH (ref 70–99)
Potassium: 4.3 mmol/L (ref 3.5–5.1)
Sodium: 137 mmol/L (ref 135–145)
Total Bilirubin: 1.3 mg/dL — ABNORMAL HIGH (ref 0.3–1.2)
Total Protein: 7 g/dL (ref 6.5–8.1)

## 2020-08-31 LAB — CBC WITH DIFFERENTIAL/PLATELET
Abs Immature Granulocytes: 0.03 10*3/uL (ref 0.00–0.07)
Basophils Absolute: 0.1 10*3/uL (ref 0.0–0.1)
Basophils Relative: 0 %
Eosinophils Absolute: 0.1 10*3/uL (ref 0.0–0.5)
Eosinophils Relative: 0 %
HCT: 40.9 % (ref 36.0–46.0)
Hemoglobin: 13.7 g/dL (ref 12.0–15.0)
Immature Granulocytes: 0 %
Lymphocytes Relative: 62 %
Lymphs Abs: 7 10*3/uL — ABNORMAL HIGH (ref 0.7–4.0)
MCH: 34 pg (ref 26.0–34.0)
MCHC: 33.5 g/dL (ref 30.0–36.0)
MCV: 101.5 fL — ABNORMAL HIGH (ref 80.0–100.0)
Monocytes Absolute: 1.1 10*3/uL — ABNORMAL HIGH (ref 0.1–1.0)
Monocytes Relative: 10 %
Neutro Abs: 3.3 10*3/uL (ref 1.7–7.7)
Neutrophils Relative %: 28 %
Platelets: 132 10*3/uL — ABNORMAL LOW (ref 150–400)
RBC: 4.03 MIL/uL (ref 3.87–5.11)
RDW: 13.4 % (ref 11.5–15.5)
WBC: 11.5 10*3/uL — ABNORMAL HIGH (ref 4.0–10.5)
nRBC: 0 % (ref 0.0–0.2)

## 2020-08-31 LAB — URINALYSIS, ROUTINE W REFLEX MICROSCOPIC
Bilirubin Urine: NEGATIVE
Glucose, UA: NEGATIVE mg/dL
Hgb urine dipstick: NEGATIVE
Ketones, ur: NEGATIVE mg/dL
Leukocytes,Ua: NEGATIVE
Nitrite: NEGATIVE
Protein, ur: NEGATIVE mg/dL
Specific Gravity, Urine: 1.009 (ref 1.005–1.030)
pH: 6 (ref 5.0–8.0)

## 2020-08-31 MED ORDER — IOHEXOL 300 MG/ML  SOLN
100.0000 mL | Freq: Once | INTRAMUSCULAR | Status: AC | PRN
  Administered 2020-08-31: 75 mL via INTRAVENOUS

## 2020-08-31 MED ORDER — ACETAMINOPHEN 325 MG PO TABS
650.0000 mg | ORAL_TABLET | Freq: Once | ORAL | Status: DC
  Filled 2020-08-31: qty 2

## 2020-08-31 MED ORDER — LOPERAMIDE HCL 2 MG PO CAPS: 2.0000 mg | ORAL_CAPSULE | Freq: Four times a day (QID) | ORAL | 0 refills | Status: DC | PRN

## 2020-08-31 MED ORDER — LOPERAMIDE HCL 2 MG PO CAPS: 4.0000 mg | ORAL_CAPSULE | Freq: Once | ORAL | Status: DC

## 2020-08-31 MED ORDER — MORPHINE SULFATE (PF) 2 MG/ML IV SOLN
2.0000 mg | Freq: Once | INTRAVENOUS | Status: AC
  Administered 2020-08-31: 2 mg via INTRAVENOUS
  Filled 2020-08-31: qty 1

## 2020-08-31 NOTE — ED Notes (Signed)
Pt expressed concers about c. Diff infection. Almyra Free, MD notified and bedside.

## 2020-08-31 NOTE — ED Triage Notes (Signed)
Pt reports starting a new antibiotic, Macrobid, last night for a UTI, reports abdominal pain and diarrhea since intake. Denies nausea or emesis. Decreased oral intake since taking abx., did not take this morning's dose.

## 2020-08-31 NOTE — Telephone Encounter (Signed)
Patient wants to let Dr. Jerilee Hoh know that she is in extreme pain.  10 out of 10  She has abdominal pain  She is seeing a Dealer and they gave her nitrofurantoin (MACRODANTIN) 50 MG capsule   She took that medication last night at 7 PM and has been in pain all night and afraid to take  Anymore.  She said that Dr. Jerilee Hoh and Dr. Sarajane Jews have both prescribed pain medication and it only makes the pain worse.  She wants Dr. Jerilee Hoh to admit her to the hospital so that they can figure out what is going on with her.  Please advise

## 2020-08-31 NOTE — Discharge Instructions (Signed)
Call your primary care doctor or specialist as discussed in the next 2-3 days.   Return immediately back to the ER if:  Your symptoms worsen within the next 12-24 hours. You develop new symptoms such as new fevers, persistent vomiting, new pain, shortness of breath, or new weakness or numbness, or if you have any other concerns.  

## 2020-08-31 NOTE — Telephone Encounter (Signed)
Patient is aware and verbally agreed to go to the ED.  Awaiting patients arrival.

## 2020-08-31 NOTE — Telephone Encounter (Signed)
Sounds like she need an urgent ED evaluation if she is in so much pain.

## 2020-08-31 NOTE — ED Notes (Signed)
Pt ambulated to bathroom to self catheterize

## 2020-08-31 NOTE — ED Notes (Signed)
April Free, MD notified that pt self catheterizes at home to urinate and [place], MD states that is fine to do here in the ED as well

## 2020-08-31 NOTE — ED Provider Notes (Signed)
[facility] DEPT Provider Note   CSN: [id number] Arrival date & time: 08/31/20  1225     History Chief Complaint  Patient presents with  . Abdominal Pain  . Diarrhea    April [name] is a 86 y.o. female.  Patient presents ER chief complai.  He felt worse today so she presents to ER.  She states she is on nt is abdominal pain and diarrhea.  Patient states that the pain started about a week or so ago, described as aching and maximal intensity in the lower abdominal region.  She states has been taking Macrobid for the last several days due to a urinary tract infection.  States she is had multiple urinary tract infections in the recent past.  Denies vomiting denies fevers.        Past Medical History:  Diagnosis Date  . Breast CA ([place])   . Bronchitis, chronic ([place])   . Compression fracture of body of thoracic vertebra (HCC)   . Eye hemorrhage, left   . Leukemia, chronic lymphoid ([place])   . 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 Osteoporosis 05/2018   T score -3.1 overall stable from prior study    Patient Active Problem List   Diagnosis Date Noted  . Vitamin D deficiency 03/09/2020  . Transaminitis 03/09/2020  . Bladder distension 10/13/2019  . Weight loss, abnormal 09/30/2019  . Acute metabolic encephalopathy 34/74/2595  . Other constipation 06/18/2019  . Physical debility 06/18/2019  . Abdominal distension 06/08/2019  . Severe pain 06/08/2019  . Intractable back pain 06/08/2019  . Closed wedge compression fracture of T8 vertebra ([place]) 06/07/2019  . Sinus tachycardia 06/07/2019  . Intractable pain 06/07/2019  . Goals of care, counseling/discussion 05/14/2019  . Abnormal liver enzymes 05/14/2019  . Dehydration 05/14/2019  . Failure to thrive in adult 05/14/2019  . Abdominal pain 04/16/2019  . Cancer associated pain 04/16/2019  . Breast cancer ([place]) 06/19/2018  . Skin lesion 05/06/2017  . Essential  hypertension 12/14/2016  . Systolic hypertension, isolated 11/16/2016  . Central retinal vein occlusion 10/18/2016  . Preventive measure 04/28/2015  . Thrombocytopenia ([place]) 04/28/2015  . Idiopathic scoliosis 10/23/2012  . CLL (chronic lymphocytic leukemia) ([place]) 03/02/2009  . VENOUS INSUFFICIENCY, CHRONIC 10/28/2008  . Dyslipidemia 10/27/2007  . Osteoporosis 10/27/2007  . SKIN CANCER, HX OF 10/27/2007  . DIVERTICULITIS, HX OF 04/08/2007    Past Surgical History:  Procedure Laterality Date  . ABDOMINAL HYSTERECTOMY  1980  . BREAST IMPLANTS REMOVED  2001  . BREAST SURGERY     Bilateral mastectomy  . IR RADIOLOGIST EVAL & MGMT  07/01/2019  . MASTECTOMY  1982   BILATERAL  . OOPHORECTOMY     BSO  . PARTIAL COLECTOMY     INTESTINAL ABSCESS WITH SALPINGECTOMY  . RECONSTRUCTION LEFT ELBOW    . TONSILLECTOMY    . UMBILLICAL HERNIA REPAIR       OB History    Gravida  0   Para  0   Term      Preterm      AB      Living        SAB      IAB      Ectopic      Multiple      Live Births              Family History  Problem Relation Age of Onset  . Cancer Brother  HODGKINS  . Cancer Brother        leukemia  . Parkinson's disease Brother   . Heart disease Mother   . Heart disease Sister     Social History   Tobacco Use  . Smoking status: Former Smoker    Types: Cigarettes    Quit date: 03/26/1955    Years since quitting: 65.4  . Smokeless tobacco: Never Used  Vaping Use  . Vaping Use: Never used  Substance Use Topics  . Alcohol use: Never  . Drug use: No    Home Medications Prior to Admission medications   Medication Sig Start Date End Date Taking? Authorizing Provider  loperamide (IMODIUM) 2 MG capsule Take 1 capsule (2 mg total) by mouth 4 (four) times daily as needed for diarrhea or loose stools. 08/31/20  Yes Luna Fuse, MD  nitrofurantoin (MACRODANTIN) 100 MG capsule Take 100 mg by mouth 2 (two) times daily.   Yes [provider]  oxyCODONE-acetaminophen (PERCOCET/ROXICET) 5-325 MG tablet Take 1 tablet by mouth every 6 (six) hours as needed for severe pain.   Yes [provider]  traMADol (ULTRAM) 50 MG tablet Take 1/2 tablet po bid prn pain Patient taking differently: Take 25 mg by mouth every 12 (twelve) hours as needed for moderate pain. 07/27/20  Yes Aundra Dubin, PA-C  ondansetron (ZOFRAN) 4 MG tablet Take 1 tablet (4 mg total) by mouth every 8 (eight) hours as needed for nausea or vomiting. 07/27/20   Aundra Dubin, PA-C  Vitamin D, Ergocalciferol, (DRISDOL) 1.25 MG (50000 UNIT) CAPS capsule Take 1 capsule (50,000 Units total) by mouth every 7 (seven) days for 12 doses. 08/25/20 11/11/20  Erline Hau, MD    Allergies    Codeine and Levofloxacin  Review of Systems   Review of Systems  Constitutional: Negative for fever.  HENT: Negative for ear pain.   Eyes: Negative for pain.  Respiratory: Negative for cough.   Cardiovascular: Negative for chest pain.  Gastrointestinal: Positive for abdominal pain.  Genitourinary: Negative for flank pain.  Musculoskeletal: Negative for back pain.  Skin: Negative for rash.  Neurological: Negative for headaches.    Physical Exam Updated Vital Signs BP (!) 176/84   Pulse 91   Temp 97.9 F (36.6 C) (Oral)   Resp 16   Ht 4\' 10"  (1.473 m)   Wt 41 kg   LMP 03/26/1979   SpO2 100%   BMI 18.89 kg/m   Physical Exam Constitutional:      General: She is not in acute distress.    Appearance: Normal appearance.  HENT:     Head: Normocephalic.     Nose: Nose normal.  Eyes:     Extraocular Movements: Extraocular movements intact.  Cardiovascular:     Rate and Rhythm: Normal rate.  Pulmonary:     Effort: Pulmonary effort is normal.  Abdominal:     Tenderness: There is abdominal tenderness in the suprapubic area.  Musculoskeletal:        General: Normal range of motion.     Cervical back: Normal range of motion.   Neurological:     General: No focal deficit present.     Mental Status: She is alert. Mental status is at baseline.     ED Results / Procedures / Treatments   Labs (all labs ordered are listed, but only abnormal results are displayed) Labs Reviewed  COMPREHENSIVE METABOLIC PANEL - Abnormal; Notable for the following components:  Result Value   Glucose, Bld 108 (*)    Total Bilirubin 1.3 (*)    All other components within normal limits  CBC WITH DIFFERENTIAL/PLATELET - Abnormal; Notable for the following components:   WBC 11.5 (*)    MCV 101.5 (*)    Platelets 132 (*)    All other components within normal limits  URINALYSIS, ROUTINE W REFLEX MICROSCOPIC    EKG None  Radiology CT Abdomen Pelvis W Contrast  Result Date: 08/31/2020 CLINICAL DATA:  86 year old female with abdominal pain. EXAM: CT ABDOMEN AND PELVIS WITH CONTRAST TECHNIQUE: Multidetector CT imaging of the abdomen and pelvis was performed using the standard protocol following bolus administration of intravenous contrast. CONTRAST:  79mL OMNIPAQUE IOHEXOL 300 MG/ML  SOLN COMPARISON:  CT abdomen pelvis dated 12/03/2019. FINDINGS: Lower chest: Minimal bibasilar linear atelectasis/scarring. The visualized lung bases are otherwise clear. The The no intra-abdominal free air or free fluid. Hepatobiliary: Subcentimeter hypodense lesion in the anterior liver is too small to characterize but most consistent with a cyst. The liver is otherwise unremarkable. No intrahepatic biliary dilatation. The gallbladder is unremarkable. Pancreas: Unremarkable. No pancreatic ductal dilatation or surrounding inflammatory changes. Spleen: Top-normal spleen size similar to prior CT. Several faint small splenic hypodense lesions are not characterized but may represent cysts or hemangioma. Adrenals/Urinary Tract: The adrenal glands unremarkable. There is no hydronephrosis on either side. There is symmetric enhancement and excretion of contrast by both  kidneys. The visualized ureters appear unremarkable. The urinary bladder is distended and grossly unremarkable. Small pocket of air within the bladder lumen may be related to recent instrumentation. Correlation with urinalysis recommended to exclude cystitis. Stomach/Bowel: There is sigmoid diverticulosis and scattered colonic diverticula without active inflammatory changes. Mild thickened appearance of the colon, likely related to underdistention. Mild colitis is less likely but not excluded clinical correlation is recommended. Postsurgical changes of bowel with anastomotic suture in the right lower quadrant. There is no bowel obstruction. The appendix is not identified with certainty and may be surgically absent. Vascular/Lymphatic: Mild aortoiliac atherosclerotic disease. The IVC is unremarkable. No portal venous gas. There is no adenopathy. Reproductive: Hysterectomy.  No adnexal masses. Other: None Musculoskeletal: Osteopenia with scoliosis and degenerative changes of the spine. No acute osseous pathology. IMPRESSION: 1. Underdistention of the colon versus less likely mild colitis. Clinical correlation is recommended. No bowel obstruction. 2. Colonic diverticulosis. 3. Aortic Atherosclerosis (ICD10-I70.0). Electronically Signed   By: Anner Crete M.D.   On: 08/31/2020 16:05    Procedures Procedures   Medications Ordered in ED Medications  acetaminophen (TYLENOL) tablet 650 mg (650 mg Oral Patient Refused/Not Given 08/31/20 1503)  morphine 2 MG/ML injection 2 mg (has no administration in time range)  loperamide (IMODIUM) capsule 4 mg (has no administration in time range)  iohexol (OMNIPAQUE) 300 MG/ML solution 100 mL (75 mLs Intravenous Contrast Given 08/31/20 1525)    ED Course  I have reviewed the triage vital signs and the nursing notes.  Pertinent labs & imaging results that were available during my care of the patient were reviewed by me and considered in my medical decision making (see  chart for details).    MDM Rules/Calculators/A&P                          Today shows normal white count 11.5.  Chem-7 remarkable for urinalysis shows no nitrites no leukocytes no evidence of UTI at this time.  CT abdomen pelvis pursued with no acute findings  other than perhaps colitis.  Patient given Imodium here, advised follow-up with her doctor in 3 to 4 days.  Advised me to return for worsening pain fevers inability keep down any fluids or any additional concerns.   Final Clinical Impression(s) / ED Diagnoses Final diagnoses:  Generalized abdominal pain    Rx / DC Orders ED Discharge Orders         Ordered    loperamide (IMODIUM) 2 MG capsule  4 times daily PRN        08/31/20 1624           Luna Fuse, MD 08/31/20 1624

## 2020-08-31 NOTE — Telephone Encounter (Signed)
Arrived at the ED

## 2020-08-31 NOTE — ED Notes (Signed)
Pt attempted to have a BM to complete c.diff screen but was unable to produce stool. Pt will follow up with PCP and is amenable to this plan

## 2020-09-02 ENCOUNTER — Ambulatory Visit: Payer: Medicare PPO | Admitting: Obstetrics and Gynecology

## 2020-09-02 ENCOUNTER — Encounter: Payer: Self-pay | Admitting: Obstetrics and Gynecology

## 2020-09-02 ENCOUNTER — Other Ambulatory Visit: Payer: Self-pay

## 2020-09-02 VITALS — BP 130/80 | HR 88 | Temp 97.7°F | Wt 91.0 lb

## 2020-09-02 DIAGNOSIS — R300 Dysuria: Secondary | ICD-10-CM

## 2020-09-02 DIAGNOSIS — N898 Other specified noninflammatory disorders of vagina: Secondary | ICD-10-CM

## 2020-09-02 DIAGNOSIS — N3001 Acute cystitis with hematuria: Secondary | ICD-10-CM

## 2020-09-02 NOTE — Progress Notes (Signed)
 April [name] 11/08/1934 [id number]  SUBJECTIVE:  86 y.o. G0P0 female presents for evaluation of lower abdominal pain that has been more or less present for the last 2 weeks.  She says she went to [facility] urology on Monday 5 days ago and initially it sounds like urinalysis testing did not show anything but the culture indicated likely UTI so she was started on nitrofurantoin  which she took for 1 day and then she went to the ED 2 days ago, repeat UA showed no nitrites or leukocytes per the ED now.  She had a CT abdomen pelvis which showed no acute findings other than colitis.  She was given Imodium  as she was having loose stool.  She says they told her to stop taking the antibiotic so she did, so she only had 1 day of nitrofurantoin  therapy.  She feels the pain has not gotten any better so she comes in today for further evaluation.  She has had pain with urination.  Yesterday she had a little bit of bright red bleeding when emptying her bladder.  She has had a prior hysterectomy.  She used 1 applicator of miconazole cream OTC last night and feels it helped the vaginal itching symptoms she has been having.  Denies any fever or chills or abnormal vaginal discharge.  Current Outpatient Medications  Medication Sig Dispense Refill  . traMADol  (ULTRAM ) 50 MG tablet Take 1/2 tablet po bid prn pain (Patient taking differently: Take 25 mg by mouth every 12 (twelve) hours as needed for moderate pain.) 30 tablet 0  . loperamide  (IMODIUM ) 2 MG capsule Take 1 capsule (2 mg total) by mouth 4 (four) times daily as needed for diarrhea or loose stools. (Patient not taking: Reported on 09/02/2020) 8 capsule 0  . nitrofurantoin  (MACRODANTIN ) 100 MG capsule Take 100 mg by mouth 2 (two) times daily. (Patient not taking: Reported on 09/02/2020)    . ondansetron  (ZOFRAN ) 4 MG tablet Take 1 tablet (4 mg total) by mouth every 8 (eight) hours as needed for nausea or vomiting. (Patient not taking: Reported on 09/02/2020) 40 tablet 0   . oxyCODONE -acetaminophen  (PERCOCET/ROXICET) 5-325 MG tablet Take 1 tablet by mouth every 6 (six) hours as needed for severe pain. (Patient not taking: Reported on 09/02/2020)    . Vitamin D , Ergocalciferol , (DRISDOL ) 1.25 MG (50000 UNIT) CAPS capsule Take 1 capsule (50,000 Units total) by mouth every 7 (seven) days for 12 doses. 12 capsule 0   No current facility-administered medications for this visit.   Allergies: Codeine and Levofloxacin  Patient's last menstrual period was 03/26/1979.  Past medical history,surgical history, problem list, medications, allergies, family history and social history were all reviewed and documented as reviewed in the EPIC chart.  ROS: Pertinent positives and negatives as reviewed in HPI    OBJECTIVE:  BP 130/80 (BP Location: Right Arm)   Pulse 88   Temp 97.7 F (36.5 C)   Wt 91 lb (41.3 kg)   LMP 03/26/1979   BMI 19.02 kg/m  The patient appears well, alert, oriented, in no distress. PELVIC EXAM: VULVA: normal appearing vulva with no masses, tenderness or lesions, VAGINA: normal appearing vagina with normal color and scant white discharge, no lesions, CERVIX: surgically absent, UTERUS: surgically absent, vaginal cuff normal, tenderness over midline pelvic area to deep palpation  Urinalysis 6-10 WBC, 3-10 RBC, 0-5 squamous epithelial cells, few bacteria, positive nitrite, trace leukocyte esterase, cloudy  Chaperone: Ginnie Gaw RN present during the examination  ASSESSMENT:  86 y.o. G0P0  here with presumed acute cystitis/UTI  PLAN:  I recommend that she resume and take the remaining 6 days of nitrofurantoin  treatment, 100 mg twice daily.  We will follow up on the urine culture.  Since she does have vaginal itching symptoms that were helped by Monistat, I would recommend that she also concurrently take the OTC Monistat cream nightly for the remainder of the 7 night treatment.  If her symptoms do not improve then she will need to turn for  evaluation.   Reyes Bailey MD 09/02/20

## 2020-09-05 LAB — URINE CULTURE
MICRO NUMBER:: 11496851
SPECIMEN QUALITY:: ADEQUATE

## 2020-09-05 LAB — URINALYSIS, COMPLETE W/RFL CULTURE
Bilirubin Urine: NEGATIVE
Glucose, UA: NEGATIVE
Hyaline Cast: NONE SEEN /LPF
Ketones, ur: NEGATIVE
Nitrites, Initial: POSITIVE — AB
Protein, ur: NEGATIVE
Specific Gravity, Urine: 1.025 (ref 1.001–1.03)
pH: 5.5 (ref 5.0–8.0)

## 2020-09-05 LAB — CULTURE INDICATED

## 2020-09-06 ENCOUNTER — Telehealth: Payer: Self-pay | Admitting: *Deleted

## 2020-09-06 MED ORDER — CIPROFLOXACIN HCL 250 MG PO TABS: 250.0000 mg | ORAL_TABLET | Freq: Two times a day (BID) | ORAL | 0 refills | Status: AC

## 2020-09-06 NOTE — Telephone Encounter (Signed)
Spoke with patient regarding the below note, patient said she stopped the [place] yesterday because it made her sick. She is willing to try the levofloxacin regardless of the rash allergy and if the rash is too bad she will call. Patient said she would like to be on the best medication to help treatment.  Please advise

## 2020-09-06 NOTE — Telephone Encounter (Signed)
-----   Message from Joseph Pierini, MD sent at 09/06/2020  8:59 AM EST ----- Hi this patient has a UTI and has been on macrobid, but the culture shows bacteria that would be resistant. I would like her on levofloxacin (but she has a rash allergy listed) There are not a lot of antibiotics that would work against this bacteria, has she taken ciprofloxacin in the past? If she can tolerate that then she should stop the macrobid and start cipro 250 mg bid x 7 days right away

## 2020-09-06 NOTE — Progress Notes (Signed)
Hi this patient has a UTI and has been on macrobid, but the culture shows bacteria that would be resistant. I would like her on levofloxacin (but she has a rash allergy listed) There are not a lot of antibiotics that would work against this bacteria, has she taken ciprofloxacin in the past? If she can tolerate that then she should stop the macrobid and start cipro 250 mg bid x 7 days right away

## 2020-09-06 NOTE — Addendum Note (Signed)
Addended by: Joseph Pierini D on: 09/06/2020 02:34 PM   Modules accepted: Orders

## 2020-09-06 NOTE — Telephone Encounter (Signed)
Left detailed message on voicemail Rx sent for cipro per Dr.Kendall.

## 2020-09-06 NOTE — Telephone Encounter (Signed)
Let's just do ciprofloxacin, I have already signed her prescription just a moment ago for cipro 250 mg bid x 7 days

## 2020-09-07 ENCOUNTER — Encounter: Payer: Self-pay | Admitting: Orthopaedic Surgery

## 2020-09-07 ENCOUNTER — Other Ambulatory Visit: Payer: Self-pay

## 2020-09-07 ENCOUNTER — Ambulatory Visit (INDEPENDENT_AMBULATORY_CARE_PROVIDER_SITE_OTHER): Payer: Medicare PPO

## 2020-09-07 ENCOUNTER — Ambulatory Visit: Payer: Medicare PPO | Admitting: Orthopaedic Surgery

## 2020-09-07 DIAGNOSIS — G8929 Other chronic pain: Secondary | ICD-10-CM | POA: Diagnosis not present

## 2020-09-07 DIAGNOSIS — S42292A Other displaced fracture of upper end of left humerus, initial encounter for closed fracture: Secondary | ICD-10-CM

## 2020-09-07 DIAGNOSIS — M5441 Lumbago with sciatica, right side: Secondary | ICD-10-CM

## 2020-09-07 DIAGNOSIS — M545 Low back pain, unspecified: Secondary | ICD-10-CM

## 2020-09-07 NOTE — Progress Notes (Signed)
Office Visit Note   Patient: April [name]           Date of Birth: 03/08/1935           MRN: [id number] Visit Date: 09/07/2020              Requested by: Isaac Bliss, Rayford Halsted, MD [place],  [place] [id number] PCP: Isaac Bliss, Rayford Halsted, MD   Assessment & Plan: Visit Diagnoses:  1. Other closed displaced fracture of proximal end of left humerus, initial encounter   2. Chronic right-sided low back pain with right-sided sciatica     Plan: Impression 7-week status post displaced left proximal humerus fracture and continued lower back pain.  In regards to her shoulder, we will go ahead and start her in outpatient physical therapy to begin range of motion exercises.  Internal referral has been made.  In regards to her back, we will go ahead and get an MRI to further assess for structural abnormalities.  Follow-up with [place] once has been completed.  Follow-up in 5 weeks for her shoulder with AP and [place] x-rays.  Call with concerns or questions.  Follow-Up Instructions: Return in about 5 weeks (around 10/12/2020).   Orders:  Orders Placed This Encounter  Procedures  . XR Shoulder Left   No orders of the defined types were placed in this encounter.     Procedures: No procedures performed   Clinical Data: No additional findings.   Subjective: Chief Complaint  Patient presents with  . Left Shoulder - Pain    HPI patient is a 86 year old female who comes in today 7 weeks out displaced left proximal humerus fracture.  She has been doing okay in regards to her shoulder.  She has been noncompliant wearing the sling.  The main issue remains her lower back.  She has had continued pain since the fall 7 weeks ago.  Initial x-rays were negative.  She has been taking tramadol as needed which does seem to moderately help.     Objective: Vital Signs: LMP 03/26/1979     Ortho Exam left shoulder shows moderate amount of ecchymosis.  She does have  moderate tenderness to the fracture site.  She has about 50% range of motion.  Stable lumbar exam  Specialty Comments:  No specialty comments available.  Imaging: XR Shoulder Left  Result Date: 09/07/2020 Stable alignment of the fracture with evidence of bony consolidation    PMFS History: Patient Active Problem List   Diagnosis Date Noted  . Vitamin D deficiency 03/09/2020  . Transaminitis 03/09/2020  . Bladder distension 10/13/2019  . Weight loss, abnormal 09/30/2019  . Acute metabolic encephalopathy 96/78/9381  . Other constipation 06/18/2019  . Physical debility 06/18/2019  . Abdominal distension 06/08/2019  . Severe pain 06/08/2019  . Intractable back pain 06/08/2019  . Closed wedge compression fracture of T8 vertebra ([place]) 06/07/2019  . Sinus tachycardia 06/07/2019  . Intractable pain 06/07/2019  . Goals of care, counseling/discussion 05/14/2019  . Abnormal liver enzymes 05/14/2019  . Dehydration 05/14/2019  . Failure to thrive in adult 05/14/2019  . Abdominal pain 04/16/2019  . Cancer associated pain 04/16/2019  . Breast cancer ([place]) 06/19/2018  . Skin lesion 05/06/2017  . Essential hypertension 12/14/2016  . Systolic hypertension, isolated 11/16/2016  . Central retinal vein occlusion 10/18/2016  . Preventive measure 04/28/2015  . Thrombocytopenia ([place]) 04/28/2015  . Idiopathic scoliosis 10/23/2012  . CLL (chronic lymphocytic leukemia) ([place]) 03/02/2009  . VENOUS INSUFFICIENCY, CHRONIC  10/28/2008  . Dyslipidemia 10/27/2007  . Osteoporosis 10/27/2007  . SKIN CANCER, HX OF 10/27/2007  . DIVERTICULITIS, HX OF 04/08/2007   Past Medical History:  Diagnosis Date  . Breast CA ([place])   . Bronchitis, chronic ([place])   . Compression fracture of body of thoracic vertebra (H[facility])   . Eye hemorrhage, left   . Leukemia, chronic lymphoid ([place])   . 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 Osteoporosis 05/2018   T score -3.1 overall stable from prior  study    Family History  Problem Relation Age of Onset  . Cancer Brother        HODGKINS  . Cancer Brother        leukemia  . Parkinson's disease Brother   . Heart disease Mother   . Heart disease Sister     Past Surgical History:  Procedure Laterality Date  . ABDOMINAL HYSTERECTOMY  1980  . BREAST IMPLANTS REMOVED  2001  . BREAST SURGERY     Bilateral mastectomy  . IR RADIOLOGIST EVAL & MGMT  07/01/2019  . MASTECTOMY  1982   BILATERAL  . OOPHORECTOMY     BSO  . PARTIAL COLECTOMY     INTESTINAL ABSCESS WITH SALPINGECTOMY  . RECONSTRUCTION LEFT ELBOW    . TONSILLECTOMY    . UMBILLICAL HERNIA REPAIR     Social History   Occupational History  . Not on file  Tobacco Use  . Smoking status: Former Smoker    Types: Cigarettes    Quit date: 03/26/1955    Years since quitting: 65.4  . Smokeless tobacco: Never Used  Vaping Use  . Vaping Use: Never used  Substance and Sexual Activity  . Alcohol use: Never  . Drug use: No  . Sexual activity: Never    Partners: Male    Birth control/protection: Surgical    Comment: HYST-1st intercourse 86 yo-More than 5 partners

## 2020-09-11 ENCOUNTER — Encounter: Payer: Self-pay | Admitting: Internal Medicine

## 2020-09-12 ENCOUNTER — Encounter: Payer: Self-pay | Admitting: Internal Medicine

## 2020-09-12 ENCOUNTER — Encounter: Payer: Self-pay | Admitting: Obstetrics and Gynecology

## 2020-09-13 ENCOUNTER — Encounter: Payer: Self-pay | Admitting: Internal Medicine

## 2020-09-13 MED ORDER — TRAMADOL HCL 50 MG PO TABS: 25.0000 mg | ORAL_TABLET | Freq: Two times a day (BID) | ORAL | 1 refills | Status: DC | PRN

## 2020-09-13 NOTE — Telephone Encounter (Signed)
Let's have her repeat the ua/culture this week

## 2020-09-14 ENCOUNTER — Other Ambulatory Visit: Payer: Self-pay | Admitting: *Deleted

## 2020-09-14 ENCOUNTER — Telehealth: Payer: Self-pay | Admitting: Orthopaedic Surgery

## 2020-09-14 ENCOUNTER — Other Ambulatory Visit: Payer: Medicare PPO

## 2020-09-14 ENCOUNTER — Telehealth: Payer: Medicare PPO | Admitting: Internal Medicine

## 2020-09-14 ENCOUNTER — Other Ambulatory Visit: Payer: Self-pay | Admitting: Physician Assistant

## 2020-09-14 ENCOUNTER — Other Ambulatory Visit: Payer: Self-pay

## 2020-09-14 DIAGNOSIS — R300 Dysuria: Secondary | ICD-10-CM

## 2020-09-14 MED ORDER — ONDANSETRON HCL 4 MG PO TABS: 4.0000 mg | ORAL_TABLET | Freq: Three times a day (TID) | ORAL | 0 refills | Status: DC | PRN

## 2020-09-14 MED ORDER — HYDROCODONE-ACETAMINOPHEN 5-325 MG PO TABS: 1.0000 | ORAL_TABLET | Freq: Three times a day (TID) | ORAL | 0 refills | Status: DC | PRN

## 2020-09-14 NOTE — Telephone Encounter (Signed)
Patient aware.

## 2020-09-14 NOTE — Telephone Encounter (Signed)
Patient called requesting pain medication for her back. Patient states her PCP has her on low dose of tramadol and her PCP informed her she needs to contact Ortho doctor for strong pain medication. Please send pain medication to pharmacy on file and call patient she sent in at [phone].

## 2020-09-14 NOTE — Telephone Encounter (Signed)
Sent in norco.  Do not take in addition to the tramadol

## 2020-09-16 ENCOUNTER — Encounter: Payer: Self-pay | Admitting: Obstetrics and Gynecology

## 2020-09-16 LAB — CULTURE INDICATED

## 2020-09-16 LAB — URINALYSIS, COMPLETE W/RFL CULTURE
Bacteria, UA: NONE SEEN /HPF
Bilirubin Urine: NEGATIVE
Glucose, UA: NEGATIVE
Hgb urine dipstick: NEGATIVE
Hyaline Cast: NONE SEEN /LPF
Ketones, ur: NEGATIVE
Nitrites, Initial: NEGATIVE
Protein, ur: NEGATIVE
Specific Gravity, Urine: 1.021 (ref 1.001–1.03)
Squamous Epithelial / HPF: NONE SEEN /HPF (ref <=5)
pH: 6 (ref 5.0–8.0)

## 2020-09-16 LAB — URINE CULTURE
MICRO NUMBER:: 11545628
Result:: NO GROWTH
SPECIMEN QUALITY:: ADEQUATE

## 2020-09-18 ENCOUNTER — Encounter: Payer: Self-pay | Admitting: Obstetrics and Gynecology

## 2020-09-20 ENCOUNTER — Other Ambulatory Visit: Payer: Self-pay | Admitting: Physician Assistant

## 2020-09-20 ENCOUNTER — Telehealth: Payer: Self-pay | Admitting: Orthopaedic Surgery

## 2020-09-20 MED ORDER — OXYCODONE-ACETAMINOPHEN 5-325 MG PO TABS: 1.0000 | ORAL_TABLET | Freq: Three times a day (TID) | ORAL | 0 refills | Status: DC | PRN

## 2020-09-20 NOTE — Telephone Encounter (Signed)
Ok thanks 

## 2020-09-20 NOTE — Telephone Encounter (Signed)
Patient called. She says the hydrocodone is not helping her. She would like someone to call her. [phone]

## 2020-09-20 NOTE — Telephone Encounter (Signed)
Pt called and informed. Her MRI is scheduled for Sat.

## 2020-09-20 NOTE — Telephone Encounter (Signed)
I jsut sent in percocet to take INSTEAD of norco.  Can we also check on mri?

## 2020-09-23 DIAGNOSIS — N3000 Acute cystitis without hematuria: Secondary | ICD-10-CM | POA: Diagnosis not present

## 2020-09-24 ENCOUNTER — Other Ambulatory Visit: Payer: Self-pay

## 2020-09-24 ENCOUNTER — Ambulatory Visit
Admission: RE | Admit: 2020-09-24 | Discharge: 2020-09-24 | Disposition: A | Discharge status: Home / Self Care | Payer: Medicare PPO | Source: Ambulatory Visit | Attending: Orthopaedic Surgery | Admitting: Orthopaedic Surgery

## 2020-09-24 DIAGNOSIS — G8929 Other chronic pain: Secondary | ICD-10-CM

## 2020-09-24 DIAGNOSIS — M545 Low back pain, unspecified: Secondary | ICD-10-CM

## 2020-09-24 DIAGNOSIS — M48061 Spinal stenosis, lumbar region without neurogenic claudication: Secondary | ICD-10-CM | POA: Diagnosis not present

## 2020-09-24 DIAGNOSIS — M5441 Lumbago with sciatica, right side: Secondary | ICD-10-CM

## 2020-09-27 ENCOUNTER — Ambulatory Visit: Payer: Medicare PPO | Admitting: Orthopaedic Surgery

## 2020-09-27 DIAGNOSIS — M5441 Lumbago with sciatica, right side: Secondary | ICD-10-CM | POA: Diagnosis not present

## 2020-09-27 DIAGNOSIS — G8929 Other chronic pain: Secondary | ICD-10-CM | POA: Diagnosis not present

## 2020-09-27 MED ORDER — HYDROCODONE-ACETAMINOPHEN 5-325 MG PO TABS: 1.0000 | ORAL_TABLET | Freq: Three times a day (TID) | ORAL | 0 refills | Status: DC | PRN

## 2020-09-27 NOTE — Progress Notes (Signed)
Office Visit Note   Patient: April [name]           Date of Birth: Nov 14, 1934           MRN: [id number] Visit Date: 09/27/2020              Requested by: Isaac Bliss, Rayford Halsted, MD [place],  [place] [address] PCP: Isaac Bliss, Rayford Halsted, MD   Assessment & Plan: Visit Diagnoses:  1. Chronic right-sided low back pain with right-sided sciatica     Plan: MRI shows significant degenerative changes of the lumbar disks as well as facet disease and scoliosis.  No acute abnormalities.  Given these findings I recommend a lumbar corset as well as evaluation by Dr. Ernestina Patches for possible back injections.  Hydrocodone was refilled.  I am happy to refill this if she needs in the future.  Follow-Up Instructions: Return if symptoms worsen or fail to improve.   Orders:  No orders of the defined types were placed in this encounter.  Meds ordered this encounter  Medications  . HYDROcodone-acetaminophen (NORCO) 5-325 MG tablet    Sig: Take 1 tablet by mouth 3 (three) times daily as needed.    Dispense:  30 tablet    Refill:  0      Procedures: No procedures performed   Clinical Data: No additional findings.   Subjective: Chief Complaint  Patient presents with  . Other    Scan review    April [name] returns today for review of MRI of the lumbar spine.  She continues to have intense pain.  She states that the pain is localized in the low back and right buttock.   Review of Systems   Objective: Vital Signs: LMP 03/26/1979   Physical Exam  Ortho Exam Exam is unchanged. Specialty Comments:  No specialty comments available.  Imaging: No results found.   PMFS History: Patient Active Problem List   Diagnosis Date Noted  . Chronic right-sided low back pain with right-sided sciatica 09/27/2020  . Vitamin D deficiency 03/09/2020  . Transaminitis 03/09/2020  . Bladder distension 10/13/2019  . Weight loss, abnormal 09/30/2019  . Acute metabolic  encephalopathy 37/04/6268  . Other constipation 06/18/2019  . Physical debility 06/18/2019  . Abdominal distension 06/08/2019  . Severe pain 06/08/2019  . Intractable back pain 06/08/2019  . Closed wedge compression fracture of T8 vertebra ([place]) 06/07/2019  . Sinus tachycardia 06/07/2019  . Intractable pain 06/07/2019  . Goals of care, counseling/discussion 05/14/2019  . Abnormal liver enzymes 05/14/2019  . Dehydration 05/14/2019  . Failure to thrive in adult 05/14/2019  . Abdominal pain 04/16/2019  . Cancer associated pain 04/16/2019  . Breast cancer ([facility]) 06/19/2018  . Skin lesion 05/06/2017  . Essential hypertension 12/14/2016  . Systolic hypertension, isolated 11/16/2016  . Central retinal vein occlusion 10/18/2016  . Preventive measure 04/28/2015  . Thrombocytopenia ([place]) 04/28/2015  . Idiopathic scoliosis 10/23/2012  . CLL (chronic lymphocytic leukemia) ([facility]) 03/02/2009  . VENOUS INSUFFICIENCY, CHRONIC 10/28/2008  . Dyslipidemia 10/27/2007  . Osteoporosis 10/27/2007  . SKIN CANCER, HX OF 10/27/2007  . DIVERTICULITIS, HX OF 04/08/2007   Past Medical History:  Diagnosis Date  . Breast CA ([facility])   . Bronchitis, chronic ([place])   . Compression fracture of body of thoracic vertebra ([facility])   . Eye hemorrhage, left   . Leukemia, chronic lymphoid ([place])   . 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 Osteoporosis 05/2018   T  score -3.1 overall stable from prior study    Family History  Problem Relation Age of Onset  . Cancer Brother        HODGKINS  . Cancer Brother        leukemia  . Parkinson's disease Brother   . Heart disease Mother   . Heart disease Sister     Past Surgical History:  Procedure Laterality Date  . ABDOMINAL HYSTERECTOMY  1980  . BREAST IMPLANTS REMOVED  2001  . BREAST SURGERY     Bilateral mastectomy  . IR RADIOLOGIST EVAL & MGMT  07/01/2019  . MASTECTOMY  1982   BILATERAL  . OOPHORECTOMY     BSO  . PARTIAL COLECTOMY      INTESTINAL ABSCESS WITH SALPINGECTOMY  . RECONSTRUCTION LEFT ELBOW    . TONSILLECTOMY    . UMBILLICAL HERNIA REPAIR     Social History   Occupational History  . Not on file  Tobacco Use  . Smoking status: Former Smoker    Types: Cigarettes    Quit date: 03/26/1955    Years since quitting: 65.5  . Smokeless tobacco: Never Used  Vaping Use  . Vaping Use: Never used  Substance and Sexual Activity  . Alcohol use: Never  . Drug use: No  . Sexual activity: Never    Partners: Male    Birth control/protection: Surgical    Comment: HYST-1st intercourse 86 yo-More than 5 partners

## 2020-09-27 NOTE — Addendum Note (Signed)
Addended byLaurann Montana on: 09/27/2020 02:16 PM   Modules accepted: Orders

## 2020-10-04 ENCOUNTER — Encounter: Payer: Self-pay | Admitting: Internal Medicine

## 2020-10-05 ENCOUNTER — Telehealth: Payer: Self-pay

## 2020-10-05 ENCOUNTER — Other Ambulatory Visit: Payer: Self-pay

## 2020-10-05 ENCOUNTER — Telehealth: Payer: Self-pay | Admitting: Orthopaedic Surgery

## 2020-10-05 ENCOUNTER — Ambulatory Visit: Payer: Self-pay

## 2020-10-05 ENCOUNTER — Ambulatory Visit: Payer: Medicare PPO | Admitting: Physical Medicine and Rehabilitation

## 2020-10-05 ENCOUNTER — Other Ambulatory Visit: Payer: Self-pay | Admitting: Physician Assistant

## 2020-10-05 ENCOUNTER — Encounter: Payer: Self-pay | Admitting: Physical Medicine and Rehabilitation

## 2020-10-05 VITALS — BP 133/84 | HR 87

## 2020-10-05 DIAGNOSIS — M5416 Radiculopathy, lumbar region: Secondary | ICD-10-CM

## 2020-10-05 MED ORDER — HYDROCODONE-ACETAMINOPHEN 5-325 MG PO TABS: 1.0000 | ORAL_TABLET | Freq: Three times a day (TID) | ORAL | 0 refills | Status: DC | PRN

## 2020-10-05 MED ORDER — OXYCODONE-ACETAMINOPHEN 5-325 MG PO TABS: 1.0000 | ORAL_TABLET | Freq: Every day | ORAL | 0 refills | Status: DC | PRN

## 2020-10-05 MED ORDER — METHYLPREDNISOLONE ACETATE 80 MG/ML IJ SUSP
80.0000 mg | Freq: Once | INTRAMUSCULAR | Status: AC
Start: 2020-10-05 — End: 2020-10-05
  Administered 2020-10-05: 80 mg

## 2020-10-05 NOTE — Progress Notes (Signed)
Pt state lower back pain that travels down her right leg. Pt state walking, standing and sitting makes the pain worse. Pt state she take pain meds and using heating pads to help ease the pain.  Numeric Pain Rating Scale and Functional Assessment Average Pain 4   In the last MONTH (on 0-10 scale) has pain interfered with the following?  1. General activity like being  able to carry out your everyday physical activities such as walking, climbing stairs, carrying groceries, or moving a chair?  Rating(9)   +Driver, -BT, -Dye Allergies.

## 2020-10-05 NOTE — Telephone Encounter (Signed)
Patient called requesting refill of hydrocodone. Please send to pharmacy on file. Patient phone number is [phone].

## 2020-10-05 NOTE — Addendum Note (Signed)
Addended by: Azucena Cecil on: 10/05/2020 03:45 PM   Modules accepted: Orders

## 2020-10-05 NOTE — Telephone Encounter (Signed)
Called patient she is aware.

## 2020-10-05 NOTE — Telephone Encounter (Signed)
done

## 2020-10-05 NOTE — Telephone Encounter (Signed)
See message below °

## 2020-10-05 NOTE — Telephone Encounter (Signed)
Patient called she stated she got a rx refilled for hydrocodone but her pharmacy doesn't have hydrocodone available at the moment she is requesting a different rx to be sent into the pharmacy such as percocet-- patient is requesting rx to be sent in before 5:00 call back:[phone]

## 2020-10-05 NOTE — Telephone Encounter (Signed)
Sent in

## 2020-10-05 NOTE — Patient Instructions (Signed)

## 2020-10-06 NOTE — Telephone Encounter (Signed)
Ok, thanks.

## 2020-10-13 ENCOUNTER — Inpatient Hospital Stay: Payer: Medicare PPO

## 2020-10-13 ENCOUNTER — Encounter: Payer: Self-pay | Admitting: Hematology and Oncology

## 2020-10-13 ENCOUNTER — Other Ambulatory Visit: Payer: Self-pay

## 2020-10-13 ENCOUNTER — Inpatient Hospital Stay: Payer: Medicare PPO | Attending: Hematology and Oncology | Admitting: Hematology and Oncology

## 2020-10-13 DIAGNOSIS — N390 Urinary tract infection, site not specified: Secondary | ICD-10-CM | POA: Insufficient documentation

## 2020-10-13 DIAGNOSIS — C9110 Chronic lymphocytic leukemia of B-cell type not having achieved remission: Secondary | ICD-10-CM | POA: Diagnosis not present

## 2020-10-13 DIAGNOSIS — K529 Noninfective gastroenteritis and colitis, unspecified: Secondary | ICD-10-CM | POA: Insufficient documentation

## 2020-10-13 DIAGNOSIS — R300 Dysuria: Secondary | ICD-10-CM

## 2020-10-13 LAB — CBC WITH DIFFERENTIAL/PLATELET
Abs Immature Granulocytes: 0.03 10*3/uL (ref 0.00–0.07)
Basophils Absolute: 0 10*3/uL (ref 0.0–0.1)
Basophils Relative: 0 %
Eosinophils Absolute: 0.1 10*3/uL (ref 0.0–0.5)
Eosinophils Relative: 1 %
HCT: 42.4 % (ref 36.0–46.0)
Hemoglobin: 13.8 g/dL (ref 12.0–15.0)
Immature Granulocytes: 0 %
Lymphocytes Relative: 66 %
Lymphs Abs: 10.5 10*3/uL — ABNORMAL HIGH (ref 0.7–4.0)
MCH: 32.9 pg (ref 26.0–34.0)
MCHC: 32.5 g/dL (ref 30.0–36.0)
MCV: 101.2 fL — ABNORMAL HIGH (ref 80.0–100.0)
Monocytes Absolute: 1.6 10*3/uL — ABNORMAL HIGH (ref 0.1–1.0)
Monocytes Relative: 10 %
Neutro Abs: 3.6 10*3/uL (ref 1.7–7.7)
Neutrophils Relative %: 23 %
Platelets: 142 10*3/uL — ABNORMAL LOW (ref 150–400)
RBC: 4.19 MIL/uL (ref 3.87–5.11)
RDW: 13.8 % (ref 11.5–15.5)
WBC: 15.9 10*3/uL — ABNORMAL HIGH (ref 4.0–10.5)
nRBC: 0 % (ref 0.0–0.2)

## 2020-10-13 LAB — COMPREHENSIVE METABOLIC PANEL
ALT: 26 U/L (ref 0–44)
AST: 18 U/L (ref 15–41)
Albumin: 4 g/dL (ref 3.5–5.0)
Alkaline Phosphatase: 104 U/L (ref 38–126)
Anion gap: 5 (ref 5–15)
BUN: 14 mg/dL (ref 8–23)
CO2: 28 mmol/L (ref 22–32)
Calcium: 9.1 mg/dL (ref 8.9–10.3)
Chloride: 106 mmol/L (ref 98–111)
Creatinine, Ser: 0.77 mg/dL (ref 0.44–1.00)
GFR, Estimated: >60 mL/min (ref >60)
Glucose, Bld: 81 mg/dL (ref 70–99)
Potassium: 3.8 mmol/L (ref 3.5–5.1)
Sodium: 139 mmol/L (ref 135–145)
Total Bilirubin: 1.1 mg/dL (ref 0.3–1.2)
Total Protein: 6.6 g/dL (ref 6.5–8.1)

## 2020-10-13 LAB — URINALYSIS, COMPLETE (UACMP) WITH MICROSCOPIC
Bilirubin Urine: NEGATIVE
Glucose, UA: NEGATIVE mg/dL
Ketones, ur: NEGATIVE mg/dL
Nitrite: NEGATIVE
Protein, ur: NEGATIVE mg/dL
Specific Gravity, Urine: 1.005 (ref 1.005–1.030)
WBC, UA: >50 WBC/hpf — ABNORMAL HIGH (ref 0–5)
pH: 6 (ref 5.0–8.0)

## 2020-10-13 NOTE — Assessment & Plan Note (Signed)
She has chronic diarrhea intermittently I told her that antibiotic therapy can increase her risk of diarrhea She is aware that if I were to prescribe antibiotics for her for recurrent UTI, she could potentially have worsening diarrhea

## 2020-10-13 NOTE — Assessment & Plan Note (Signed)
She has slight lymphocytosis since her last visit I do not believe that is the cause of her recurrent UTI I plan to see her again in 6 months for further follow-up

## 2020-10-13 NOTE — Progress Notes (Signed)
[facility] OFFICE PROGRESS NOTE  Patient Care Team: Isaac Bliss, Rayford Halsted, MD as PCP - General (Internal Medicine) Julianne Handler, NP as Nurse Practitioner (Hospice and Palliative Medicine)  ASSESSMENT & PLAN:  CLL (chronic lymphocytic leukemia) ([place]) She has slight lymphocytosis since her last visit I do not believe that is the cause of her recurrent UTI I plan to see her again in 6 months for further follow-up  Dysuria She has chronic urinary retention and that is the most likely cause of her recurrent UTI She requires in and out cath I have ordered urinalysis and urine culture per patient request I will call her with test results tomorrow Due to chronic exposure to antibiotic therapy, I prefer to wait for culture and sensitivity results to be available rather than placing her on empiric antibiotic treatment  Chronic diarrhea She has chronic diarrhea intermittently I told her that antibiotic therapy can increase her risk of diarrhea She is aware that if I were to prescribe antibiotics for her for recurrent UTI, she could potentially have worsening diarrhea   No orders of the defined types were placed in this encounter.   All questions were answered. The patient knows to call the clinic with any problems, questions or concerns. The total time spent in the appointment was 20 minutes encounter with patients including review of chart and various tests results, discussions about plan of care and coordination of care plan   Heath Lark, MD 10/13/2020 3:25 PM  INTERVAL HISTORY: Please see below for problem oriented charting. She returns for further follow-up on history of CLL She complained of significant bladder discomfort and pain when she urinates She performs self cath approximately 4 times a day Each time, she has somewhere between 8 to 10 ounces of urine She denies blood in her urine She is known to have chronic urinary retention and recurrent UTI She  requested repeat urinalysis and urine culture In addition, she has chronic intermittent loose stool She continues to have chronic abdominal pain She denies fever or chills  SUMMARY OF ONCOLOGIC HISTORY: Oncology History Overview Note  Del 13 q   CLL (chronic lymphocytic leukemia) ([facility])  03/15/2009 Pathology Results   Case #: [id number]  flow cytometry of peripheral blood comfirmed CLL.   04/25/2017 Pathology Results   FISH panel is positive for deletion 13 q only   04/22/2019 Imaging   Ct scan of the chest, abdomen and pelvis 1. No adenopathy within the chest, abdomen or pelvis. 2. Splenomegaly with scattered low-density lesions measuring up to 1.3 cm. 3. Small subpleural nodule in the left lower lobe and right middle lobe measuring up to 5 mm. No follow-up needed if patient is low-risk (and has no known or suspected primary neoplasm). Non-contrast chest CT can be considered in 12 months if patient is high-risk. This recommendation follows the consensus statement: Guidelines for Management of Incidental Pulmonary Nodules Detected on CT Images: From the Fleischner Society 2017; Radiology 2017; 284:228-243. 4. Aortic Atherosclerosis (ICD10-I70.0). Coronary artery atherosclerotic calcifications.     04/23/2019 Cancer Staging   Staging form: Chronic Lymphocytic Leukemia / Small Lymphocytic Lymphoma, AJCC 8th Edition - Clinical stage from 04/23/2019: Modified Rai Stage IV (Modified Rai risk: High, Lymphocytosis: Present, Adenopathy: Absent, Organomegaly: Present, Anemia: Absent, Thrombocytopenia: Present) - Signed by Heath Lark, MD on 04/23/2019   04/28/2019 -  Chemotherapy   The patient had Ibrutinib for chemotherapy treatment.     06/01/2019 -  Chemotherapy   The patient had Bendamustine for  chemotherapy   06/02/2019 Imaging   Ct head, chest, abdomen and pelvis 1. New T8 compression deformity with approximately 30% height loss. Worsening anterior compression deformity of the T11 vertebrae now  with 20% height loss anteriorly. Additional T12 and L2 compression deformities are stable from prior. 2. No other acute traumatic injury within the chest, abdomen, or pelvis. Specifically, no splenic injury or retroperitoneal hematoma. 3. Mild intra and extrahepatic biliary ductal dilatation with a small calcified gallstone seen in the distal common bile duct just proximal to the ampulla of Vater. Correlate with LFTs and consider further evaluation with MRCP as clinically indicated. 4. Stable sub 5 mm nodules in the right middle and left lower lobe. No further evaluation is required if patient is low risk however should continue with previously recommended 12 month follow-up in September of 2021 if patient is high risk. 5. Severe scoliotic curvature of the thoracolumbar spine with associated chest wall deformity. 6. Duodenal and colonic diverticula without features of diverticulitis. 7. Moderate bladder distention, correlate for features of outlet obstruction or retention. 8. Aortic Atherosclerosis (ICD10-I70.0).   06/07/2019 - 06/10/2019 Hospital Admission   She was admitted for management of LUQ and back pain   06/08/2019 Imaging   MRI abdomen 1. Mild intra and extrahepatic biliary duct prominence with common bile duct measuring upper normal for patient age. No gallstones evident and no definite choledocholithiasis. Somewhat abrupt transition of the duct into the ampulla noted, but nonspecific. ERCP could be used to further evaluate for ampullary lesion as clinically warranted. 2. Hepatic and renal cysts. 3. Presumed marked distention of the urinary bladder.   06/08/2019 Imaging   Ct angiogram No gross evidence of large or central pulmonary emboli is identified on exam limited secondary to suboptimal pulmonary arterial opacification and respiratory motion.   Bibasilar atelectasis.   Osseous demineralization with compression fractures of several thoracic vertebra.   Aneurysmal dilatation  ascending thoracic aorta 4.1 cm transverse, recommendation below.   Recommend annual imaging followup by CTA or MRA.   Aortic Atherosclerosis (ICD10-I70.0).   Aortic aneurysm NOS   10/09/2019 Imaging   1. No definite acute CT findings of the abdomen or pelvis to explain pain. 2. Very distended urinary bladder.  Correlate for urinary retention. 3. Large burden of stool in the distal colon and rectum. Correlate for constipation. 4. Unchanged mild intrahepatic biliary ductal dilatation. Common bile duct normal in caliber measuring up to 6 mm. No obstructing lesion or calculi identified. 5. No mass or lymphadenopathy in the abdomen or pelvis. 6. Aortic Atherosclerosis (ICD10-I70.0).     REVIEW OF SYSTEMS:   Constitutional: Denies fevers, chills or abnormal weight loss Eyes: Denies blurriness of vision Ears, nose, mouth, throat, and face: Denies mucositis or sore throat Cardiovascular: Denies palpitation, chest discomfort or lower extremity swelling Gastrointestinal:  Denies nausea, heartburn or change in bowel habits Skin: Denies abnormal skin rashes Lymphatics: Denies new lymphadenopathy or easy bruising Neurological:Denies numbness, tingling or new weaknesses Behavioral/Psych: Mood is stable, no new changes  All other systems were reviewed with the patient and are negative.  I have reviewed the past medical history, past surgical history, social history and family history with the patient and they are unchanged from previous note.  ALLERGIES:  is allergic to codeine and levofloxacin.  MEDICATIONS:  Current Outpatient Medications  Medication Sig Dispense Refill  . HYDROcodone-acetaminophen (NORCO) 5-325 MG tablet Take 1 tablet by mouth 3 (three) times daily as needed. 30 tablet 0  . loperamide (IMODIUM) 2 MG capsule  Take 1 capsule (2 mg total) by mouth 4 (four) times daily as needed for diarrhea or loose stools. 8 capsule 0  . ondansetron (ZOFRAN) 4 MG tablet Take 1 tablet (4 mg  total) by mouth every 8 (eight) hours as needed for nausea or vomiting. 40 tablet 0  . oxyCODONE-acetaminophen (PERCOCET) 5-325 MG tablet Take 1-2 tablets by mouth daily as needed for severe pain. 20 tablet 0  . oxyCODONE-acetaminophen (PERCOCET/ROXICET) 5-325 MG tablet Take 1 tablet by mouth every 8 (eight) hours as needed for severe pain. 20 tablet 0  . traMADol (ULTRAM) 50 MG tablet Take 0.5 tablets (25 mg total) by mouth every 12 (twelve) hours as needed for moderate pain. 30 tablet 1  . Vitamin D, Ergocalciferol, (DRISDOL) 1.25 MG (50000 UNIT) CAPS capsule Take 1 capsule (50,000 Units total) by mouth every 7 (seven) days for 12 doses. 12 capsule 0   No current facility-administered medications for this visit.    PHYSICAL EXAMINATION: ECOG PERFORMANCE STATUS: 1 - Symptomatic but completely ambulatory  Vitals:   10/13/20 1135  BP: (!) 142/78  Pulse: 88  Resp: 18  Temp: 98.2 F (36.8 C)  SpO2: 100%   Filed Weights   10/13/20 1135  Weight: 92 lb (41.7 kg)    GENERAL:alert, no distress and comfortable.  She looks thin and cachectic SKIN: skin color, texture, turgor are normal, no rashes or significant lesions EYES: normal, Conjunctiva are pink and non-injected, sclera clear OROPHARYNX:no exudate, no erythema and lips, buccal mucosa, and tongue normal  NECK: supple, thyroid normal size, non-tender, without nodularity LYMPH:  no palpable lymphadenopathy in the cervical, axillary or inguinal LUNGS: clear to auscultation and percussion with normal breathing effort HEART: regular rate & rhythm and no murmurs and no lower extremity edema ABDOMEN:abdomen soft, palpable enlarged bladder Musculoskeletal:no cyanosis of digits and no clubbing  NEURO: alert & oriented x 3 with fluent speech, no focal motor/sensory deficits  LABORATORY DATA:  I have reviewed the data as listed    Component Value Date/Time   NA 139 10/13/2020 1109   NA 141 04/25/2017 1041   K 3.8 10/13/2020 1109   K  4.6 04/25/2017 1041   CL 106 10/13/2020 1109   CL 104 10/27/2012 1209   CO2 28 10/13/2020 1109   CO2 26 04/25/2017 1041   GLUCOSE 81 10/13/2020 1109   GLUCOSE 97 04/25/2017 1041   GLUCOSE 104 (H) 10/27/2012 1209   BUN 14 10/13/2020 1109   BUN 13.1 04/25/2017 1041   CREATININE 0.77 10/13/2020 1109   CREATININE 0.77 03/08/2020 1030   CREATININE 0.8 04/25/2017 1041   CALCIUM 9.1 10/13/2020 1109   CALCIUM 9.8 04/25/2017 1041   PROT 6.6 10/13/2020 1109   PROT 7.5 04/25/2017 1041   ALBUMIN 4.0 10/13/2020 1109   ALBUMIN 4.4 04/25/2017 1041   AST 18 10/13/2020 1109   AST 23 04/25/2017 1041   ALT 26 10/13/2020 1109   ALT 14 04/25/2017 1041   ALKPHOS 104 10/13/2020 1109   ALKPHOS 69 04/25/2017 1041   BILITOT 1.1 10/13/2020 1109   BILITOT 0.95 04/25/2017 1041   GFRNONAA >60 10/13/2020 1109   GFRAA >60 12/03/2019 1055    No results found for: SPEP, UPEP  Lab Results  Component Value Date   WBC 15.9 (H) 10/13/2020   NEUTROABS 3.6 10/13/2020   HGB 13.8 10/13/2020   HCT 42.4 10/13/2020   MCV 101.2 (H) 10/13/2020   PLT 142 (L) 10/13/2020      Chemistry  Component Value Date/Time   NA 139 10/13/2020 1109   NA 141 04/25/2017 1041   K 3.8 10/13/2020 1109   K 4.6 04/25/2017 1041   CL 106 10/13/2020 1109   CL 104 10/27/2012 1209   CO2 28 10/13/2020 1109   CO2 26 04/25/2017 1041   BUN 14 10/13/2020 1109   BUN 13.1 04/25/2017 1041   CREATININE 0.77 10/13/2020 1109   CREATININE 0.77 03/08/2020 1030   CREATININE 0.8 04/25/2017 1041      Component Value Date/Time   CALCIUM 9.1 10/13/2020 1109   CALCIUM 9.8 04/25/2017 1041   ALKPHOS 104 10/13/2020 1109   ALKPHOS 69 04/25/2017 1041   AST 18 10/13/2020 1109   AST 23 04/25/2017 1041   ALT 26 10/13/2020 1109   ALT 14 04/25/2017 1041   BILITOT 1.1 10/13/2020 1109   BILITOT 0.95 04/25/2017 1041       RADIOGRAPHIC STUDIES: I have personally reviewed the radiological images as listed and agreed with the findings in the  report. MR Lumbar Spine w/o contrast  Result Date: 09/25/2020 CLINICAL DATA:  Low back pain radiating to the right leg. History of fracture in scoliosis. EXAM: MRI LUMBAR SPINE WITHOUT CONTRAST TECHNIQUE: Multiplanar, multisequence MR imaging of the lumbar spine was performed. No intravenous contrast was administered. COMPARISON:  Radiography 07/27/2020.  MRI 07/15/2019. FINDINGS: Segmentation: 5 lumbar type vertebral bodies as numbered previously. Alignment: It appears that scoliotic curvature as worsened since the study of December 2020. There is thoracolumbar curvature convex to the left and lower lumbar curvature convex to the right. No significant antero or retrolisthesis. Increased kyphotic curvature at the thoracolumbar junction related to old fractures. Vertebrae: Old compression deformities at T11, T12 and L1. The L1 fracture was acute or subacute in December of 2020. All of these fractures are healed without residual edema or progression. No new regional fracture. Conus medullaris and cauda equina: Conus extends to the L1 level. Conus and cauda equina appear normal. Paraspinal and other soft tissues: No acute paravertebral soft tissue finding. Disc levels: In the lower thoracic and upper lumbar region, there is increased kyphotic curvature because of the old fractures. The spinal canal appears sufficiently patent throughout that region. No traumatic disc herniation. No compressive stenosis of the canal or foramina. L1-2: Disc bulge. Mild facet hypertrophy. No compressive canal stenosis. Mild bilateral foraminal narrowing. L2-3: Broad base disc herniation. Facet and ligamentous hypertrophy. Stenosis of both lateral recesses left more than right. Neural compression could occur on either side at this level, more likely the left. Similar appearance to the study of December 2020. L3-4: Disc bulge. Facet degeneration and hypertrophy. Mild narrowing of the lateral recesses but no definite neural compression.  No change. L4-5: Bulging of the disc. Facet and ligamentous hypertrophy. Mild narrowing of the lateral recesses but no definite neural compression. No change. L5-S1: Chronic disc degeneration with loss of height and mild bulging. No compressive narrowing of the canal or foramina. No change. IMPRESSION: Old compression fractures at T11, T12 and L1 are healed without evidence of progression. No acute or subacute fracture. Increased kyphotic curvature in that region. Worsened thoracolumbar scoliotic curvature convex to the left when compared the study of December 2020. Degenerative disc disease and facet disease throughout the lumbar region. At L2-3, there is a chronic disc protrusion more prominent towards the left. Combination with hypertrophic degenerative facet disease, there is narrowing of the lateral recesses and foramina, left more than right. Neural compression could possibly occur at this level, seemingly more  likely on the left. Similar appearance to the study of 2020. Other lesser degenerative changes as outlined above at each level, not significantly changed since December 2020. Electronically Signed   By: Nelson Chimes M.D.   On: 09/25/2020 22:04   XR C-ARM NO REPORT  Result Date: 10/05/2020 Please see Notes tab for imaging impression.

## 2020-10-13 NOTE — Assessment & Plan Note (Signed)
She has chronic urinary retention and that is the most likely cause of her recurrent UTI She requires in and out cath I have ordered urinalysis and urine culture per patient request I will call her with test results tomorrow Due to chronic exposure to antibiotic therapy, I prefer to wait for culture and sensitivity results to be available rather than placing her on empiric antibiotic treatment

## 2020-10-14 ENCOUNTER — Telehealth: Payer: Self-pay

## 2020-10-14 NOTE — Telephone Encounter (Signed)
She called and left a message to call her about antibiotic.  Called back. Per Dr. Alvy Bimler urine culture is not back. She is waiting on urine culture to result before prescribing antibiotic. She verbalized understanding.

## 2020-10-16 LAB — URINE CULTURE: Culture: >=100000 — AB

## 2020-10-17 ENCOUNTER — Other Ambulatory Visit: Payer: Self-pay | Admitting: Hematology and Oncology

## 2020-10-17 ENCOUNTER — Telehealth: Payer: Self-pay | Admitting: Hematology and Oncology

## 2020-10-17 ENCOUNTER — Telehealth: Payer: Self-pay

## 2020-10-17 DIAGNOSIS — R300 Dysuria: Secondary | ICD-10-CM

## 2020-10-17 DIAGNOSIS — N390 Urinary tract infection, site not specified: Secondary | ICD-10-CM | POA: Insufficient documentation

## 2020-10-17 DIAGNOSIS — B9620 Unspecified Escherichia coli [E. coli] as the cause of diseases classified elsewhere: Secondary | ICD-10-CM

## 2020-10-17 MED ORDER — NITROFURANTOIN MONOHYD MACRO 100 MG PO CAPS: 100.0000 mg | ORAL_CAPSULE | Freq: Two times a day (BID) | ORAL | 0 refills | Status: DC

## 2020-10-17 NOTE — Telephone Encounter (Signed)
Pt informed of MD recommendations -  Pt verbalized agreement and understanding.  Pt will pick up nitrofurantoin, and instructed to update clinic if she does not hear anything in 5-7 business days regarding referral.

## 2020-10-17 NOTE — Telephone Encounter (Signed)
Scheduled per 3/21 sch msg. Called and spoke with pt, confirmed 9/20 appts

## 2020-10-17 NOTE — Telephone Encounter (Signed)
-----   Message from Heath Lark, MD sent at 10/17/2020 11:14 AM EDT ----- Regarding: RE: resistant UTI Since cipro is on her allergy list, I am not comfortable prescribing it Continue nitrofurantoin as prescribed I will send referral to ID ----- Message ----- From: Rennis Harding, RN Sent: 10/17/2020  11:11 AM EDT To: Heath Lark, MD Subject: RE: resistant UTI                              Spoke with patient - She said she is actually able to tolerate Cipro -  Patient received Cipro on 2/8 and had not reactions.   Would you like to change antibiotic or continue with current plan? Pt willing to see infectious disease if you feel needed.    ----- Message ----- From: Heath Lark, MD Sent: 10/17/2020   8:08 AM EDT To: Rennis Harding, RN Subject: resistant UTI                                  She has multi drug resistant UTI The only oral med I can prescribe is nitrofurantoin and it may not work Because she has levofloxacin listed as allergy, I cannot prescribe cipro I will prescribe nitrofurantoin but I also suggest she sees infectious disease for long term management. If she agrees, I can send referral I will see her in 6 months, scheduler will call

## 2020-10-18 ENCOUNTER — Other Ambulatory Visit: Payer: Self-pay | Admitting: Physician Assistant

## 2020-10-18 ENCOUNTER — Telehealth: Payer: Self-pay | Admitting: Orthopaedic Surgery

## 2020-10-18 MED ORDER — HYDROCODONE-ACETAMINOPHEN 5-325 MG PO TABS: 1.0000 | ORAL_TABLET | Freq: Three times a day (TID) | ORAL | 0 refills | Status: DC | PRN

## 2020-10-18 NOTE — Telephone Encounter (Signed)
I think it is best to wean to norco.  Let me know if she is ok with this

## 2020-10-18 NOTE — Telephone Encounter (Signed)
Pt wondering if she can get a refill on percocet?

## 2020-10-18 NOTE — Telephone Encounter (Signed)
Sent in

## 2020-10-18 NOTE — Telephone Encounter (Signed)
Yes

## 2020-10-25 ENCOUNTER — Other Ambulatory Visit: Payer: Self-pay

## 2020-10-25 ENCOUNTER — Telehealth: Payer: Self-pay | Admitting: Orthopaedic Surgery

## 2020-10-25 ENCOUNTER — Encounter: Payer: Self-pay | Admitting: Internal Medicine

## 2020-10-25 ENCOUNTER — Ambulatory Visit: Payer: Medicare PPO | Admitting: Internal Medicine

## 2020-10-25 ENCOUNTER — Other Ambulatory Visit: Payer: Self-pay | Admitting: Physician Assistant

## 2020-10-25 DIAGNOSIS — B9620 Unspecified Escherichia coli [E. coli] as the cause of diseases classified elsewhere: Secondary | ICD-10-CM | POA: Diagnosis not present

## 2020-10-25 DIAGNOSIS — N390 Urinary tract infection, site not specified: Secondary | ICD-10-CM | POA: Diagnosis not present

## 2020-10-25 DIAGNOSIS — B379 Candidiasis, unspecified: Secondary | ICD-10-CM | POA: Diagnosis not present

## 2020-10-25 DIAGNOSIS — R339 Retention of urine, unspecified: Secondary | ICD-10-CM | POA: Diagnosis not present

## 2020-10-25 MED ORDER — FLUCONAZOLE 150 MG PO TABS: ORAL_TABLET | ORAL | 0 refills | Status: DC

## 2020-10-25 MED ORDER — HYDROCODONE-ACETAMINOPHEN 5-325 MG PO TABS: 1.0000 | ORAL_TABLET | Freq: Two times a day (BID) | ORAL | 0 refills | Status: DC

## 2020-10-25 NOTE — Patient Instructions (Addendum)
Thank you for coming to see me today. It was a pleasure seeing you.  To Do: Marland Kitchen Take Diflucan once today and again in 72 hours . For now, I would prefer to avoid suppressive antibiotics for UTI . Keep [place] updated if you have any recurrent urinary symptoms such as burning when you urinate or worsening pain and we can collect a urine sample to see if there is active infection . Follow up with me in 3 months, sooner if needed  If you have any questions or concerns, please do not hesitate to call the office at [phone].  Take Care,   Jule Ser, DO

## 2020-10-25 NOTE — Telephone Encounter (Signed)
Patient called. She would like a refill on hydrocodone. Her call back number is [phone]

## 2020-10-25 NOTE — Telephone Encounter (Signed)
Refilled, but needs to start weaning off of narcotics

## 2020-10-25 NOTE — Telephone Encounter (Signed)
I called patient and advised. She states that she will try and wean the medication the best that she can. She continues to have pain, but will try to make the medication last as long as she can.

## 2020-10-25 NOTE — Progress Notes (Signed)
[facility]  Reason for Consult: UTI  Referring Provider: Dr Alvy Bimler   HPI:    April [name] is a 86 y.o. female with PMHx as below who presents to the clinic for further evaluation of possible recurrent urinary tract infection.    She was recently seen by her oncologist, Dr. Alvy Bimler, on 10/13/2020.  She complained of bladder discomfort and pain when she urinated.  She has chronic urinary retention and self catheterizes approximately 4 times per day.  She does have chronic abdominal pain so this was not necessarily new.  At that 3/17 visit she requested a repeat urinalysis and culture which was done.  She denied any fevers or chills.    Due to her chronic exposure to antibiotics, her oncologist waited for her culture results to become available prior to placing her on any antibiotic therapy.  Her UA on 3/17 showed many bacteria, pyuria, negative nitrites.  Urine culture revealed greater than 100,000 colonies of E. coli with oral antibiotic susceptibility options including ciprofloxacin, nitrofurantoin, TMP-SMX, and third-generation cephalosporins.  She was placed on nitrofurantoin which she completed yesterday.  This appears to have resolved her current issues (still with some abdominal discomfort) but she is now dealing with a yeast infection.  She reports frequent yeast infections as well and she will often times use monistat 7 for this.   Regarding her urinary symptoms, she started following with [facility] for urinary retention in April 2021 and has been dealing with UTI symptoms since that time with multiple antibiotic courses.  She reports that she often does not have much dysuria but complains more of suprapubic pain as her main complaint and has been getting antibiotic courses almost on a monthly basis.  No fevers or chills.  No prior hx of prior UTI before last April.    She had a CT abd/pelvis last month with "symmetric enhancement and excretion of  contrast by both kidneys, visualized ureters appear unremarkable. The urinary bladder is distended and grossly unremarkable. Small pocket of air within the bladder lumen may be related to recent instrumentation."  Regarding her CLL, per last oncology note she has slight lymphocytosis since prior visit and the plan is to have her follow-up again in 6 months.    Patient's Medications  New Prescriptions   FLUCONAZOLE (DIFLUCAN) 150 MG TABLET    Take 1 pill today and 2nd pill in 72 hours for yeast infection  Previous Medications   HYDROCODONE-ACETAMINOPHEN (NORCO) 5-325 MG TABLET    Take 1 tablet by mouth 2 (two) times daily.   LOPERAMIDE (IMODIUM) 2 MG CAPSULE    Take 1 capsule (2 mg total) by mouth 4 (four) times daily as needed for diarrhea or loose stools.   ONDANSETRON (ZOFRAN) 4 MG TABLET    Take 1 tablet (4 mg total) by mouth every 8 (eight) hours as needed for nausea or vomiting.   TRAMADOL (ULTRAM) 50 MG TABLET    Take 0.5 tablets (25 mg total) by mouth every 12 (twelve) hours as needed for moderate pain.   VITAMIN D, ERGOCALCIFEROL, (DRISDOL) 1.25 MG (50000 UNIT) CAPS CAPSULE    Take 1 capsule (50,000 Units total) by mouth every 7 (seven) days for 12 doses.  Modified Medications   No medications on file  Discontinued Medications   AMOXICILLIN-CLAVULANATE (AUGMENTIN) 500-125 MG TABLET    Take 1 tablet by mouth 2 (two) times daily.   NITROFURANTOIN, MACROCRYSTAL-MONOHYDRATE, (MACROBID) 100 MG CAPSULE  Take 1 capsule (100 mg total) by mouth 2 (two) times daily.   OXYCODONE-ACETAMINOPHEN (PERCOCET) 5-325 MG TABLET    Take 1-2 tablets by mouth daily as needed for severe pain.   OXYCODONE-ACETAMINOPHEN (PERCOCET/ROXICET) 5-325 MG TABLET    Take 1 tablet by mouth every 8 (eight) hours as needed for severe pain.      Past Medical History:  Diagnosis Date  . Breast CA ([place])   . Bronchitis, chronic ([place])   . Compression fracture of body of thoracic vertebra ([facility])   . Eye hemorrhage, left    . 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 Osteoporosis 05/2018   T score -3.1 overall stable from prior study    Social History   Tobacco Use  . Smoking status: Former Smoker    Types: Cigarettes    Quit date: 03/26/1955    Years since quitting: 65.6  . Smokeless tobacco: Never Used  Vaping Use  . Vaping Use: Never used  Substance Use Topics  . Alcohol use: Never  . Drug use: No    Family History  Problem Relation Age of Onset  . Cancer Brother        HODGKINS  . Cancer Brother        leukemia  . Parkinson's disease Brother   . Heart disease Mother   . Heart disease Sister     Allergies  Allergen Reactions  . Codeine Nausea Only  . Levofloxacin Rash    Review of Systems  Constitutional: Negative.   Respiratory: Negative.   Cardiovascular: Negative.   Gastrointestinal: Positive for abdominal pain.  Genitourinary: Negative for dysuria, flank pain, frequency and urgency.      OBJECTIVE:    Vitals:   10/25/20 1354  BP: (!) 145/84  Pulse: 91  Temp: 97.9 F (36.6 C)  TempSrc: Oral  SpO2: 99%  Weight: 94 lb (42.6 kg)     Body mass index is 19.65 kg/m.  Physical Exam Constitutional:      General: She is not in acute distress.    Appearance: Normal appearance.  Eyes:     Extraocular Movements: Extraocular movements intact.     Conjunctiva/sclera: Conjunctivae normal.  Pulmonary:     Effort: Pulmonary effort is normal. No respiratory distress.  Neurological:     General: No focal deficit present.     Mental Status: She is alert and oriented to person, place, and time.  Psychiatric:        Mood and Affect: Mood normal.        Behavior: Behavior normal.      Labs and Microbiology:  CBC Latest Ref Rng & Units 10/13/2020 08/31/2020 03/08/2020  WBC 4.0 - 10.5 K/uL 15.9(H) 11.5(H) 8.6  Hemoglobin 12.0 - 15.0 g/dL 13.8 13.7 14.0  Hematocrit 36.0 - 46.0 % 42.4 40.9 40.6  Platelets 150 - 400  K/uL 142(L) 132(L) 122(L)   CMP Latest Ref Rng & Units 10/13/2020 08/31/2020 05/10/2020  Glucose 70 - 99 mg/dL 81 108(H) -  BUN 8 - 23 mg/dL 14 10 -  Creatinine 0.44 - 1.00 mg/dL 0.77 0.58 -  Sodium 135 - 145 mmol/L 139 137 -  Potassium 3.5 - 5.1 mmol/L 3.8 4.3 -  Chloride 98 - 111 mmol/L 106 104 -  CO2 22 - 32 mmol/L 28 25 -  Calcium 8.9 - 10.3 mg/dL 9.1 9.3 -  Total Protein 6.5 - 8.1 g/dL 6.6 7.0 6.8  Total  Bilirubin 0.3 - 1.2 mg/dL 1.1 1.3(H) 1.1  Alkaline Phos 38 - 126 U/L 104 117 -  AST 15 - 41 U/L 18 28 32  ALT 0 - 44 U/L 26 30 38(H)       ASSESSMENT & PLAN:    E. coli UTI (urinary tract infection) Recent UA and culture with >100k colonies E coli now treated with Nitrofurantoin.  She has no dysuria today but does continue to have abdominal discomfort which is a chronic issue.  Discussed that whether this represents UTI in the absence of dysuria is hard to tease out and may be from another cause.  Discussed UTI prevention strategy including Hiprex, however, her creatinine clearance prohibits the use of this medication.  Discussed lower dose antibiotic suppression but I would prefer to avoid contributing to the development of resistant bacteria by doing this.  She will continue to follow up with urology on a regular basis for her chronic retention issues and I will see her back in 3 months.  In the interim, if she has any new UTI symptoms she will submit a UA and culture prior to antibiotics since definitive UTI is not always clear.   Yeast infection She complains of yeast infection that has worsened after recent course of antibiotics.  Will give her prescription for Diflucan 150mg  PO x 1 dose today and again in 72 hrs.   Urinary retention She follows with [facility] for this issue and self caths about 4-6 times per day.  I am unclear if this is contributing to her chronic abdominal pain as well but have doubt that her pain is truly reflective of UTI as opposed to an alternative  etiology.    No orders of the defined types were placed in this encounter.     Raynelle High[facility] for Infectious [facility] 10/25/2020, 3:59 PM   I spent 45 minutes dedicated to the care of this patient on the date of this encounter to include pre-visit review of records, face-to-face time with the patient discussing UTI, yeast infection, urinary retention

## 2020-10-25 NOTE — Assessment & Plan Note (Signed)
Recent UA and culture with >100k colonies E coli now treated with Nitrofurantoin.  She has no dysuria today but does continue to have abdominal discomfort which is a chronic issue.  Discussed that whether this represents UTI in the absence of dysuria is hard to tease out and may be from another cause.  Discussed UTI prevention strategy including Hiprex, however, her creatinine clearance prohibits the use of this medication.  Discussed lower dose antibiotic suppression but I would prefer to avoid contributing to the development of resistant bacteria by doing this.  She will continue to follow up with urology on a regular basis for her chronic retention issues and I will see her back in 3 months.  In the interim, if she has any new UTI symptoms she will submit a UA and culture prior to antibiotics since definitive UTI is not always clear.

## 2020-10-25 NOTE — Telephone Encounter (Signed)
Please advise 

## 2020-10-25 NOTE — Assessment & Plan Note (Signed)
She follows with [facility] for this issue and self caths about 4-6 times per day.  I am unclear if this is contributing to her chronic abdominal pain as well but have doubt that her pain is truly reflective of UTI as opposed to an alternative etiology.

## 2020-10-25 NOTE — Assessment & Plan Note (Signed)
She complains of yeast infection that has worsened after recent course of antibiotics.  Will give her prescription for Diflucan 150mg  PO x 1 dose today and again in 72 hrs.

## 2020-10-27 DIAGNOSIS — R339 Retention of urine, unspecified: Secondary | ICD-10-CM | POA: Diagnosis not present

## 2020-11-01 NOTE — Procedures (Signed)
Lumbosacral Transforaminal Epidural Steroid Injection - Sub-Pedicular Approach with Fluoroscopic Guidance  Patient: April [name]      Date of Birth: 05/24/1935 MRN: [id number] PCP: Isaac Bliss, Rayford Halsted, MD      Visit Date: 10/05/2020   Universal Protocol:    Date/Time: 10/05/2020  Consent Given By: the patient  Position: PRONE  Additional Comments: Vital signs were monitored before and after the procedure. Patient was prepped and draped in the usual sterile fashion. The correct patient, procedure, and site was verified.   Injection Procedure Details:   Procedure diagnoses: Lumbar radiculopathy [M54.16]    Meds Administered:  Meds ordered this encounter  Medications  . methylPREDNISolone acetate (DEPO-MEDROL) injection 80 mg    Laterality: Right  Location/Site:  L4-L5  Needle:5.0 in., 22 ga.  Short bevel or Quincke spinal needle  Needle Placement: Transforaminal  Findings:    -Comments: Excellent flow of contrast along the nerve, nerve root and into the epidural space.  Procedure Details: After squaring off the end-plates to get a true AP view, the C-arm was positioned so that an oblique view of the foramen as noted above was visualized. The target area is just inferior to the "nose of the scotty dog" or sub pedicular. The soft tissues overlying this structure were infiltrated with 2-3 ml. of 1% Lidocaine without Epinephrine.  The spinal needle was inserted toward the target using a "trajectory" view along the fluoroscope beam.  Under AP and lateral visualization, the needle was advanced so it did not puncture dura and was located close the 6 O'Clock position of the pedical in AP tracterory. Biplanar projections were used to confirm position. Aspiration was confirmed to be negative for CSF and/or blood. A 1-2 ml. volume of Isovue-250 was injected and flow of contrast was noted at each level. Radiographs were obtained for documentation purposes.   After attaining  the desired flow of contrast documented above, a 0.5 to 1.0 ml test dose of 0.25% Marcaine was injected into each respective transforaminal space.  The patient was observed for 90 seconds post injection.  After no sensory deficits were reported, and normal lower extremity motor function was noted,   the above injectate was administered so that equal amounts of the injectate were placed at each foramen (level) into the transforaminal epidural space.   Additional Comments:  No complications occurred Dressing: 2 x 2 sterile gauze and Band-Aid    Post-procedure details: Patient was observed during the procedure. Post-procedure instructions were reviewed.  Patient left the clinic in stable condition.

## 2020-11-01 NOTE — Progress Notes (Signed)
April [name] - 86 y.o. female MRN [id number]  Date of birth: 04/05/1935  Office Visit Note: Visit Date: 10/05/2020 PCP: Isaac Bliss, Rayford Halsted, MD Referred by: Isaac Bliss, Estel*  Subjective: Chief Complaint  Patient presents with  . Lower Back - Pain  . Right Leg - Pain   HPI:  April [name] is a 86 y.o. female who comes in today at the request of Dr. Eduard Roux for planned Right L4-L5 Lumbar epidural steroid injection with fluoroscopic guidance.  The patient has failed conservative care including home exercise, medications, time and activity modification.  This injection will be diagnostic and hopefully therapeutic.  Please see requesting physician notes for further details and justification. MRI reviewed with images and spine model.  MRI reviewed in the note below.    ROS Otherwise per HPI.  Assessment & Plan: Visit Diagnoses:    ICD-10-CM   1. Lumbar radiculopathy  M54.16 XR C-ARM NO REPORT    Epidural Steroid injection    methylPREDNISolone acetate (DEPO-MEDROL) injection 80 mg    Plan: No additional findings.   Meds & Orders:  Meds ordered this encounter  Medications  . methylPREDNISolone acetate (DEPO-MEDROL) injection 80 mg    Orders Placed This Encounter  Procedures  . XR C-ARM NO REPORT  . Epidural Steroid injection    Follow-up: No follow-ups on file.   Procedures: No procedures performed  Lumbosacral Transforaminal Epidural Steroid Injection - Sub-Pedicular Approach with Fluoroscopic Guidance  Patient: April [name]      Date of Birth: 06/20/1935 MRN: [id number] PCP: Isaac Bliss, Rayford Halsted, MD      Visit Date: 10/05/2020   Universal Protocol:    Date/Time: 10/05/2020  Consent Given By: the patient  Position: PRONE  Additional Comments: Vital signs were monitored before and after the procedure. Patient was prepped and draped in the usual sterile fashion. The correct patient, procedure, and site was verified.   Injection  Procedure Details:   Procedure diagnoses: Lumbar radiculopathy [M54.16]    Meds Administered:  Meds ordered this encounter  Medications  . methylPREDNISolone acetate (DEPO-MEDROL) injection 80 mg    Laterality: Right  Location/Site:  L4-L5  Needle:5.0 in., 22 ga.  Short bevel or Quincke spinal needle  Needle Placement: Transforaminal  Findings:    -Comments: Excellent flow of contrast along the nerve, nerve root and into the epidural space.  Procedure Details: After squaring off the end-plates to get a true AP view, the C-arm was positioned so that an oblique view of the foramen as noted above was visualized. The target area is just inferior to the "nose of the scotty dog" or sub pedicular. The soft tissues overlying this structure were infiltrated with 2-3 ml. of 1% Lidocaine without Epinephrine.  The spinal needle was inserted toward the target using a "trajectory" view along the fluoroscope beam.  Under AP and lateral visualization, the needle was advanced so it did not puncture dura and was located close the 6 O'Clock position of the pedical in AP tracterory. Biplanar projections were used to confirm position. Aspiration was confirmed to be negative for CSF and/or blood. A 1-2 ml. volume of Isovue-250 was injected and flow of contrast was noted at each level. Radiographs were obtained for documentation purposes.   After attaining the desired flow of contrast documented above, a 0.5 to 1.0 ml test dose of 0.25% Marcaine was injected into each respective transforaminal space.  The patient was observed for 90 seconds post injection.  After no sensory  deficits were reported, and normal lower extremity motor function was noted,   the above injectate was administered so that equal amounts of the injectate were placed at each foramen (level) into the transforaminal epidural space.   Additional Comments:  No complications occurred Dressing: 2 x 2 sterile gauze and Band-Aid     Post-procedure details: Patient was observed during the procedure. Post-procedure instructions were reviewed.  Patient left the clinic in stable condition.      Clinical History: MRI LUMBAR SPINE WITHOUT CONTRAST  TECHNIQUE: Multiplanar, multisequence MR imaging of the lumbar spine was performed. No intravenous contrast was administered.  COMPARISON:  Radiography 07/27/2020.  MRI 07/15/2019.  FINDINGS: Segmentation: 5 lumbar type vertebral bodies as numbered previously.  Alignment: It appears that scoliotic curvature as worsened since the study of December 2020. There is thoracolumbar curvature convex to the left and lower lumbar curvature convex to the right. No significant antero or retrolisthesis. Increased kyphotic curvature at the thoracolumbar junction related to old fractures.  Vertebrae: Old compression deformities at T11, T12 and L1. The L1 fracture was acute or subacute in December of 2020. All of these fractures are healed without residual edema or progression. No new regional fracture.  Conus medullaris and cauda equina: Conus extends to the L1 level. Conus and cauda equina appear normal.  Paraspinal and other soft tissues: No acute paravertebral soft tissue finding.  Disc levels:  In the lower thoracic and upper lumbar region, there is increased kyphotic curvature because of the old fractures. The spinal canal appears sufficiently patent throughout that region. No traumatic disc herniation. No compressive stenosis of the canal or foramina.  L1-2: Disc bulge. Mild facet hypertrophy. No compressive canal stenosis. Mild bilateral foraminal narrowing.  L2-3: Broad base disc herniation. Facet and ligamentous hypertrophy. Stenosis of both lateral recesses left more than right. Neural compression could occur on either side at this level, more likely the left. Similar appearance to the study of December 2020.  L3-4: Disc bulge. Facet  degeneration and hypertrophy. Mild narrowing of the lateral recesses but no definite neural compression. No change.  L4-5: Bulging of the disc. Facet and ligamentous hypertrophy. Mild narrowing of the lateral recesses but no definite neural compression. No change.  L5-S1: Chronic disc degeneration with loss of height and mild bulging. No compressive narrowing of the canal or foramina. No change.  IMPRESSION: Old compression fractures at T11, T12 and L1 are healed without evidence of progression. No acute or subacute fracture. Increased kyphotic curvature in that region. Worsened thoracolumbar scoliotic curvature convex to the left when compared the study of December 2020.  Degenerative disc disease and facet disease throughout the lumbar region. At L2-3, there is a chronic disc protrusion more prominent towards the left. Combination with hypertrophic degenerative facet disease, there is narrowing of the lateral recesses and foramina, left more than right. Neural compression could possibly occur at this level, seemingly more likely on the left. Similar appearance to the study of 2020.  Other lesser degenerative changes as outlined above at each level, not significantly changed since December 2020.   Electronically Signed   By: Nelson Chimes M.D.   On: 09/25/2020 22:04     Objective:  VS:  HT:    WT:   BMI:     BP:133/84  HR:87bpm  TEMP: ( )  RESP:  Physical Exam Vitals and nursing note reviewed.  Constitutional:      General: She is not in acute distress.    Appearance: Normal appearance. She  is not ill-appearing.  HENT:     Head: Normocephalic and atraumatic.     Right Ear: External ear normal.     Left Ear: External ear normal.  Eyes:     Extraocular Movements: Extraocular movements intact.  Cardiovascular:     Rate and Rhythm: Normal rate.     Pulses: Normal pulses.  Pulmonary:     Effort: Pulmonary effort is normal. No respiratory distress.   Abdominal:     General: There is no distension.     Palpations: Abdomen is soft.  Musculoskeletal:        General: Tenderness present.     Cervical back: Neck supple.     Right lower leg: No edema.     Left lower leg: No edema.     Comments: Patient has good distal strength with no pain over the greater trochanters.  No clonus or focal weakness.  Skin:    Findings: No erythema, lesion or rash.  Neurological:     General: No focal deficit present.     Mental Status: She is alert and oriented to person, place, and time.     Sensory: No sensory deficit.     Motor: No weakness or abnormal muscle tone.     Coordination: Coordination normal.  Psychiatric:        Mood and Affect: Mood normal.        Behavior: Behavior normal.      Imaging: No results found.

## 2020-11-11 ENCOUNTER — Telehealth: Payer: Self-pay

## 2020-11-11 NOTE — Telephone Encounter (Signed)
Volunteer call with Palliative care made to check in on patient. Patient is doing well- no new concerns voiced

## 2020-11-15 ENCOUNTER — Other Ambulatory Visit: Payer: Medicare PPO

## 2020-11-15 ENCOUNTER — Other Ambulatory Visit: Payer: Self-pay

## 2020-11-15 ENCOUNTER — Telehealth: Payer: Self-pay

## 2020-11-15 DIAGNOSIS — B9620 Unspecified Escherichia coli [E. coli] as the cause of diseases classified elsewhere: Secondary | ICD-10-CM

## 2020-11-15 DIAGNOSIS — N390 Urinary tract infection, site not specified: Secondary | ICD-10-CM | POA: Diagnosis not present

## 2020-11-15 NOTE — Telephone Encounter (Signed)
Patient called to report that she's having mild discomfort with urination, and difficulty self cathing herself. Does report continued retention which is being followed by Urology. No fevers or chills. Stated she will be dropping off a urine sample for testing. Per Dr. Juleen China, ok to order UA and urine cultures. Orders placed.  Will contact patient with any findings.   Bryona Foxworthy Lorita Officer, RN

## 2020-11-16 ENCOUNTER — Telehealth: Payer: Self-pay

## 2020-11-16 ENCOUNTER — Other Ambulatory Visit: Payer: Self-pay | Admitting: Internal Medicine

## 2020-11-16 MED ORDER — FLUCONAZOLE 150 MG PO TABS: ORAL_TABLET | ORAL | 0 refills | Status: DC

## 2020-11-16 NOTE — Telephone Encounter (Signed)
Patient notified of prescription. Will follow up once culture results are in.   Terin Dierolf Lorita Officer, RN

## 2020-11-16 NOTE — Telephone Encounter (Signed)
Relayed test results from UA to patient. Patient reports that she's unsure if the symptoms she's having is from a UTI or a yeast infection. Does report that the Fluconazole previously prescribed helped with symptoms. She does report a new symptom of increased urgency not previously there. Informed her that once cultures come back that we'll notify her with plan.   Presten Joost Lorita Officer, RN

## 2020-11-16 NOTE — Telephone Encounter (Signed)
-----   Message from Mignon Pine, DO sent at 11/16/2020  1:39 PM EDT ----- Please let patient know that her UA looks like there could be infection but waiting on culture results to see if anything grows.  Assuming she is still only having mild symptoms would prefer to hold off on any abx while waiting on this.   Thanks, Mitzi Hansen

## 2020-11-17 LAB — URINALYSIS
Bilirubin Urine: NEGATIVE
Glucose, UA: NEGATIVE
Hgb urine dipstick: NEGATIVE
Ketones, ur: NEGATIVE
Nitrite: POSITIVE — AB
Protein, ur: NEGATIVE
Specific Gravity, Urine: 1.013 (ref 1.001–1.03)
pH: <=5 (ref 5.0–8.0)

## 2020-11-17 LAB — URINE CULTURE
MICRO NUMBER:: 11787536
SPECIMEN QUALITY:: ADEQUATE

## 2020-11-18 ENCOUNTER — Telehealth: Payer: Self-pay

## 2020-11-18 MED ORDER — CEFDINIR 300 MG PO CAPS: 300.0000 mg | ORAL_CAPSULE | Freq: Two times a day (BID) | ORAL | 0 refills | Status: AC

## 2020-11-18 NOTE — Telephone Encounter (Signed)
Patient informed of urine culture results and cefdinir was sent to the pharmacy for her. Patient instructed on how the medication needs to be taken. Patient advised and verbalized understanding.  April [name] [name]

## 2020-11-18 NOTE — Telephone Encounter (Signed)
-----   Message from Mignon Pine, DO sent at 11/18/2020  8:27 AM EDT ----- Can you pls let pt know her urine cx grew sensitive E coli.  I have Rx'ed cefdinir 300mg  BID x 5 days to treat her symptomatic UTI.  Thanks, Mitzi Hansen

## 2020-11-22 ENCOUNTER — Telehealth: Payer: Self-pay | Admitting: Orthopaedic Surgery

## 2020-11-22 NOTE — Telephone Encounter (Signed)
Cannot refill norco.  Happy to call in tramadol or tylenol 3.  If needs something stronger, we can refer to pain management or she can reach out to pcp

## 2020-11-22 NOTE — Telephone Encounter (Signed)
Patient called requesting a refill of Norco. Please send to pharmacy on file. Patient phone number is [phone].

## 2020-11-23 ENCOUNTER — Telehealth: Payer: Self-pay | Admitting: Physician Assistant

## 2020-11-23 NOTE — Telephone Encounter (Signed)
Patient called requesting a call back. She states she will take tramadol. Please call this in and call patient when sent to pharmacy. Patient phone number is [phone].

## 2020-11-24 ENCOUNTER — Other Ambulatory Visit: Payer: Self-pay | Admitting: Physician Assistant

## 2020-11-24 MED ORDER — TRAMADOL HCL 50 MG PO TABS: 25.0000 mg | ORAL_TABLET | Freq: Two times a day (BID) | ORAL | 1 refills | Status: DC | PRN

## 2020-11-24 NOTE — Telephone Encounter (Signed)
Sent in

## 2020-11-28 ENCOUNTER — Ambulatory Visit: Payer: Medicare PPO | Admitting: Nurse Practitioner

## 2020-11-28 ENCOUNTER — Encounter: Payer: Self-pay | Admitting: Nurse Practitioner

## 2020-11-28 ENCOUNTER — Other Ambulatory Visit: Payer: Self-pay

## 2020-11-28 VITALS — BP 130/80 | HR 100 | Temp 97.9°F | Resp 16 | Wt 92.0 lb

## 2020-11-28 DIAGNOSIS — N3090 Cystitis, unspecified without hematuria: Secondary | ICD-10-CM | POA: Diagnosis not present

## 2020-11-28 DIAGNOSIS — N390 Urinary tract infection, site not specified: Secondary | ICD-10-CM

## 2020-11-28 DIAGNOSIS — N9089 Other specified noninflammatory disorders of vulva and perineum: Secondary | ICD-10-CM | POA: Diagnosis not present

## 2020-11-28 DIAGNOSIS — R102 Pelvic and perineal pain: Secondary | ICD-10-CM | POA: Diagnosis not present

## 2020-11-28 LAB — WET PREP FOR TRICH, YEAST, CLUE

## 2020-11-28 MED ORDER — SULFAMETHOXAZOLE-TRIMETHOPRIM 800-160 MG PO TABS: 1.0000 | ORAL_TABLET | Freq: Two times a day (BID) | ORAL | 0 refills | Status: DC

## 2020-11-28 NOTE — Progress Notes (Signed)
GYNECOLOGY  VISIT  CC:   Feels swollen because it is difficult to cath herself, some vaginal discharge, discomfort. Symptoms started 11/24/2020  HPI: 86 y.o. G0P0 Married White or Caucasian female here for vaginitis. Took fluconazole #2 tabs 72 hours apart about 2 weeks ago and feels like it did help at that time. Has history of recurrent bladder infections    Feels like it is difficult to catheterize herself. She uses this for urinary retention of unknown origin  GYNECOLOGIC HISTORY: Patient's last menstrual period was 03/26/1979. Contraception: post menopausal Menopausal hormone therapy: none  Patient Active Problem List   Diagnosis Date Noted  . Yeast infection 10/25/2020  . Urinary retention 10/25/2020  . E. coli UTI (urinary tract infection) 10/17/2020  . Dysuria 10/13/2020  . Chronic diarrhea 10/13/2020  . Chronic right-sided low back pain with right-sided sciatica 09/27/2020  . Vitamin D deficiency 03/09/2020  . Transaminitis 03/09/2020  . Bladder distension 10/13/2019  . Weight loss, abnormal 09/30/2019  . Acute metabolic encephalopathy 54/62/7035  . Other constipation 06/18/2019  . Physical debility 06/18/2019  . Abdominal distension 06/08/2019  . Severe pain 06/08/2019  . Intractable back pain 06/08/2019  . Closed wedge compression fracture of T8 vertebra ([place]) 06/07/2019  . Sinus tachycardia 06/07/2019  . Intractable pain 06/07/2019  . Goals of care, counseling/discussion 05/14/2019  . Abnormal liver enzymes 05/14/2019  . Dehydration 05/14/2019  . Failure to thrive in adult 05/14/2019  . Abdominal pain 04/16/2019  . Cancer associated pain 04/16/2019  . Breast cancer ([place]) 06/19/2018  . Skin lesion 05/06/2017  . Essential hypertension 12/14/2016  . Systolic hypertension, isolated 11/16/2016  . Central retinal vein occlusion 10/18/2016  . Preventive measure 04/28/2015  . Thrombocytopenia ([place]) 04/28/2015  . Idiopathic scoliosis 10/23/2012  . CLL (chronic  lymphocytic leukemia) ([place]) 03/02/2009  . VENOUS INSUFFICIENCY, CHRONIC 10/28/2008  . Dyslipidemia 10/27/2007  . Osteoporosis 10/27/2007  . SKIN CANCER, HX OF 10/27/2007  . DIVERTICULITIS, HX OF 04/08/2007    Past Medical History:  Diagnosis Date  . Breast CA ([place])   . Bronchitis, chronic ([place])   . Compression fracture of body of thoracic vertebra ([facility])   . Eye hemorrhage, left   . Leukemia, chronic lymphoid ([place])   . 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 Osteoporosis 05/2018   T score -3.1 overall stable from prior study    Past Surgical History:  Procedure Laterality Date  . ABDOMINAL HYSTERECTOMY  1980  . BREAST IMPLANTS REMOVED  2001  . BREAST SURGERY     Bilateral mastectomy  . IR RADIOLOGIST EVAL & MGMT  07/01/2019  . MASTECTOMY  1982   BILATERAL  . OOPHORECTOMY     BSO  . PARTIAL COLECTOMY     INTESTINAL ABSCESS WITH SALPINGECTOMY  . RECONSTRUCTION LEFT ELBOW    . TONSILLECTOMY    . UMBILLICAL HERNIA REPAIR      MEDS:   Current Outpatient Medications on File Prior to Visit  Medication Sig Dispense Refill  . loperamide (IMODIUM) 2 MG capsule Take 1 capsule (2 mg total) by mouth 4 (four) times daily as needed for diarrhea or loose stools. 8 capsule 0  . traMADol (ULTRAM) 50 MG tablet Take 0.5 tablets (25 mg total) by mouth every 12 (twelve) hours as needed for moderate pain. 30 tablet 1  . ondansetron (ZOFRAN) 4 MG tablet Take 1 tablet (4 mg total) by mouth every 8 (eight) hours as needed for nausea or vomiting. (Patient not taking:  Reported on 11/28/2020) 40 tablet 0   No current facility-administered medications on file prior to visit.    ALLERGIES: Codeine and Levofloxacin  Family History  Problem Relation Age of Onset  . Cancer Brother        HODGKINS  . Cancer Brother        leukemia  . Parkinson's disease Brother   . Heart disease Mother   . Heart disease Sister      Review of Systems  Constitutional: Negative.   HENT:  Negative.   Eyes: Negative.   Respiratory: Negative.   Cardiovascular: Negative.   Gastrointestinal: Negative.   Endocrine: Negative.   Genitourinary:       Vaginal swelling, some discharge  Musculoskeletal: Negative.   Skin: Negative.   Allergic/Immunologic: Negative.   Neurological: Negative.   Hematological: Negative.   Psychiatric/Behavioral: Negative.     PHYSICAL EXAMINATION:    BP 130/80   Pulse 100   Temp 97.9 F (36.6 C) (Oral)   Resp 16   Wt 92 lb (41.7 kg)   LMP 03/26/1979   BMI 19.23 kg/m     General appearance: alert, cooperative, no acute distress  Lymph:  no inguinal LAD noted  Pelvic: External genitalia:  no lesions              Urethra:  normal appearing urethra with no masses, tenderness or lesions              Bartholins and Skenes: normal                 Vagina: normal appearing vagina for ago, ph 5.0, minimal discharge, no redness, atrophy              Cervix: absent              Bimanual Exam:  Uterus:  uterus absent              Adnexa: not evaluated              Wet mount: no yeast Urinalysis: 1+ blood, Positive nitrates, trace leukocytes  Chaperone, Raquel Sarna, RN, was present for exam.  Assessment/Plan:  Pelvic pain - Plan: Urinalysis,Complete w/RFL Culture  Vulvar irritation - Plan: WET PREP FOR TRICH, YEAST, CLUE  Recurrent urinary tract infection  Cystitis - Plan: sulfamethoxazole-trimethoprim (BACTRIM DS) 800-160 MG tablet  Encouraged patient to call urology and make appointment for follow up care considering frequency of urinary tract infections.

## 2020-11-28 NOTE — Patient Instructions (Signed)
Urinary Tract Infection, Adult A urinary tract infection (UTI) is an infection of any part of the urinary tract. The urinary tract includes:  The kidneys.  The ureters.  The bladder.  The urethra. These organs make, store, and get rid of pee (urine) in the body. What are the causes? This infection is caused by germs (bacteria) in your genital area. These germs grow and cause swelling (inflammation) of your urinary tract. What increases the risk? The following factors may make you more likely to develop this condition:  Using a small, thin tube (catheter) to drain pee.  Not being able to control when you pee or poop (incontinence).  Being female. If you are female, these things can increase the risk: ? Using these methods to prevent pregnancy:  A medicine that kills sperm (spermicide).  A device that blocks sperm (diaphragm). ? Having low levels of a female hormone (estrogen). ? Being pregnant. You are more likely to develop this condition if:  You have genes that add to your risk.  You are sexually active.  You take antibiotic medicines.  You have trouble peeing because of: ? A prostate that is bigger than normal, if you are female. ? A blockage in the part of your body that drains pee from the bladder. ? A kidney stone. ? A nerve condition that affects your bladder. ? Not getting enough to drink. ? Not peeing often enough.  You have other conditions, such as: ? Diabetes. ? A weak disease-fighting system (immune system). ? Sickle cell disease. ? Gout. ? Injury of the spine. What are the signs or symptoms? Symptoms of this condition include:  Needing to pee right away.  Peeing small amounts often.  Pain or burning when peeing.  Blood in the pee.  Pee that smells bad or not like normal.  Trouble peeing.  Pee that is cloudy.  Fluid coming from the vagina, if you are female.  Pain in the belly or lower back. Other symptoms include:  Vomiting.  Not  feeling hungry.  Feeling mixed up (confused). This may be the first symptom in older adults.  Being tired and grouchy (irritable).  A fever.  Watery poop (diarrhea). How is this treated?  Taking antibiotic medicine.  Taking other medicines.  Drinking enough water. In some cases, you may need to see a specialist. Follow these instructions at home: Medicines  Take over-the-counter and prescription medicines only as told by your doctor.  If you were prescribed an antibiotic medicine, take it as told by your doctor. Do not stop taking it even if you start to feel better. General instructions  Make sure you: ? Pee until your bladder is empty. ? Do not hold pee for a long time. ? Empty your bladder after sex. ? Wipe from front to back after peeing or pooping if you are a female. Use each tissue one time when you wipe.  Drink enough fluid to keep your pee pale yellow.  Keep all follow-up visits.   Contact a doctor if:  You do not get better after 1-2 days.  Your symptoms go away and then come back. Get help right away if:  You have very bad back pain.  You have very bad pain in your lower belly.  You have a fever.  You have chills.  You feeling like you will vomit or you vomit. Summary  A urinary tract infection (UTI) is an infection of any part of the urinary tract.  This condition is caused by   germs in your genital area.  There are many risk factors for a UTI.  Treatment includes antibiotic medicines.  Drink enough fluid to keep your pee pale yellow. This information is not intended to replace advice given to you by your health care provider. Make sure you discuss any questions you have with your health care provider. Document Revised: 02/26/2020 Document Reviewed: 02/26/2020 Elsevier Patient Education  2021 Elsevier Inc.  

## 2020-12-01 ENCOUNTER — Encounter: Payer: Self-pay | Admitting: Internal Medicine

## 2020-12-01 LAB — URINE CULTURE
MICRO NUMBER:: 11837714
SPECIMEN QUALITY:: ADEQUATE

## 2020-12-01 LAB — URINALYSIS, COMPLETE W/RFL CULTURE
Bilirubin Urine: NEGATIVE
Glucose, UA: NEGATIVE
Hyaline Cast: NONE SEEN /LPF
Ketones, ur: NEGATIVE
Nitrites, Initial: POSITIVE — AB
Protein, ur: NEGATIVE
Specific Gravity, Urine: 1.025 (ref 1.001–1.035)
pH: 5 (ref 5.0–8.0)

## 2020-12-01 LAB — CULTURE INDICATED

## 2020-12-06 DIAGNOSIS — N3020 Other chronic cystitis without hematuria: Secondary | ICD-10-CM | POA: Diagnosis not present

## 2020-12-08 ENCOUNTER — Ambulatory Visit: Payer: Medicare PPO | Attending: Internal Medicine

## 2020-12-08 ENCOUNTER — Other Ambulatory Visit (HOSPITAL_BASED_OUTPATIENT_CLINIC_OR_DEPARTMENT_OTHER): Payer: Self-pay

## 2020-12-08 ENCOUNTER — Other Ambulatory Visit: Payer: Self-pay

## 2020-12-08 DIAGNOSIS — Z23 Encounter for immunization: Secondary | ICD-10-CM

## 2020-12-08 MED ORDER — PFIZER-BIONT COVID-19 VAC-TRIS 30 MCG/0.3ML IM SUSP
INTRAMUSCULAR | 0 refills | Status: DC
  Filled 2020-12-08: qty 0.3, 1d supply, fill #0

## 2020-12-08 NOTE — Progress Notes (Signed)
   Covid-19 Vaccination Clinic  Name:  April [name]    MRN: [id number] DOB: Jan 26, 1935  12/08/2020  [name] was observed post Covid-19 immunization for 15 minutes without incident. She was provided with Vaccine Information Sheet and instruction to access the V-Safe system.   [name] was instructed to call 911 with any severe reactions post vaccine: Marland Kitchen Difficulty breathing  . Swelling of face and throat  . A fast heartbeat  . A bad rash all over body  . Dizziness and weakness   Immunizations Administered    Name Date Dose VIS Date Route   PFIZER Comrnaty(Gray TOP) Covid-19 Vaccine 12/08/2020  2:57 PM 0.3 mL 07/07/2020 Intramuscular   Manufacturer: [place]   Lot: [id number]   NDC: [phone]

## 2020-12-14 DIAGNOSIS — H35371 Puckering of macula, right eye: Secondary | ICD-10-CM | POA: Diagnosis not present

## 2020-12-14 DIAGNOSIS — Z961 Presence of intraocular lens: Secondary | ICD-10-CM | POA: Diagnosis not present

## 2020-12-14 DIAGNOSIS — H35033 Hypertensive retinopathy, bilateral: Secondary | ICD-10-CM | POA: Diagnosis not present

## 2020-12-14 DIAGNOSIS — H34812 Central retinal vein occlusion, left eye, with macular edema: Secondary | ICD-10-CM | POA: Diagnosis not present

## 2020-12-27 ENCOUNTER — Other Ambulatory Visit: Payer: Self-pay

## 2020-12-28 ENCOUNTER — Other Ambulatory Visit: Payer: Self-pay

## 2020-12-28 ENCOUNTER — Other Ambulatory Visit (INDEPENDENT_AMBULATORY_CARE_PROVIDER_SITE_OTHER): Payer: Medicare PPO

## 2020-12-28 DIAGNOSIS — E559 Vitamin D deficiency, unspecified: Secondary | ICD-10-CM

## 2020-12-28 LAB — VITAMIN D 25 HYDROXY (VIT D DEFICIENCY, FRACTURES): VITD: 41.17 ng/mL (ref 30.00–100.00)

## 2021-01-25 ENCOUNTER — Ambulatory Visit: Payer: Medicare PPO | Admitting: Internal Medicine

## 2021-01-27 ENCOUNTER — Encounter: Payer: Self-pay | Admitting: Hematology and Oncology

## 2021-01-31 ENCOUNTER — Telehealth: Payer: Self-pay | Admitting: Orthopaedic Surgery

## 2021-01-31 NOTE — Telephone Encounter (Signed)
Please see below and advise.

## 2021-01-31 NOTE — Telephone Encounter (Signed)
Pt called asking for a refill of her tramadol rx; and she would like to be notified when this has been sent in.  [phone]

## 2021-02-01 ENCOUNTER — Other Ambulatory Visit: Payer: Self-pay | Admitting: Physician Assistant

## 2021-02-01 MED ORDER — TRAMADOL HCL 50 MG PO TABS: 50.0000 mg | ORAL_TABLET | Freq: Three times a day (TID) | ORAL | 2 refills | Status: DC | PRN

## 2021-02-01 NOTE — Telephone Encounter (Signed)
Sent in

## 2021-02-01 NOTE — Telephone Encounter (Signed)
I called pt to advise that rx has been sent to pharm.

## 2021-02-07 DIAGNOSIS — N3020 Other chronic cystitis without hematuria: Secondary | ICD-10-CM | POA: Diagnosis not present

## 2021-02-07 DIAGNOSIS — R3914 Feeling of incomplete bladder emptying: Secondary | ICD-10-CM | POA: Diagnosis not present

## 2021-03-22 ENCOUNTER — Telehealth: Payer: Self-pay

## 2021-03-22 ENCOUNTER — Other Ambulatory Visit: Payer: Self-pay | Admitting: Internal Medicine

## 2021-03-22 MED ORDER — FLUCONAZOLE 150 MG PO TABS: ORAL_TABLET | ORAL | 0 refills | Status: DC

## 2021-03-22 NOTE — Telephone Encounter (Signed)
Patient called and was asking if she could get some medication sent in due to her recurring yeast infection.

## 2021-03-23 DIAGNOSIS — C8590 Non-Hodgkin lymphoma, unspecified, unspecified site: Secondary | ICD-10-CM | POA: Diagnosis not present

## 2021-03-23 DIAGNOSIS — Z8249 Family history of ischemic heart disease and other diseases of the circulatory system: Secondary | ICD-10-CM | POA: Diagnosis not present

## 2021-03-23 DIAGNOSIS — N390 Urinary tract infection, site not specified: Secondary | ICD-10-CM | POA: Diagnosis not present

## 2021-03-23 DIAGNOSIS — M2550 Pain in unspecified joint: Secondary | ICD-10-CM | POA: Diagnosis not present

## 2021-03-23 DIAGNOSIS — M810 Age-related osteoporosis without current pathological fracture: Secondary | ICD-10-CM | POA: Diagnosis not present

## 2021-03-23 DIAGNOSIS — Z79891 Long term (current) use of opiate analgesic: Secondary | ICD-10-CM | POA: Diagnosis not present

## 2021-03-23 DIAGNOSIS — G8929 Other chronic pain: Secondary | ICD-10-CM | POA: Diagnosis not present

## 2021-03-23 DIAGNOSIS — C9511 Chronic leukemia of unspecified cell type, in remission: Secondary | ICD-10-CM | POA: Diagnosis not present

## 2021-03-23 DIAGNOSIS — B379 Candidiasis, unspecified: Secondary | ICD-10-CM | POA: Diagnosis not present

## 2021-04-18 ENCOUNTER — Ambulatory Visit: Payer: Medicare PPO | Admitting: Hematology and Oncology

## 2021-04-18 ENCOUNTER — Other Ambulatory Visit: Payer: Medicare PPO

## 2021-04-18 DIAGNOSIS — N3020 Other chronic cystitis without hematuria: Secondary | ICD-10-CM | POA: Diagnosis not present

## 2021-04-26 DIAGNOSIS — R339 Retention of urine, unspecified: Secondary | ICD-10-CM | POA: Diagnosis not present

## 2021-05-02 ENCOUNTER — Inpatient Hospital Stay: Payer: Medicare PPO

## 2021-05-02 ENCOUNTER — Inpatient Hospital Stay: Payer: Medicare PPO | Attending: Hematology and Oncology

## 2021-05-02 ENCOUNTER — Inpatient Hospital Stay: Payer: Medicare PPO | Admitting: Hematology and Oncology

## 2021-05-02 ENCOUNTER — Other Ambulatory Visit: Payer: Self-pay

## 2021-05-02 DIAGNOSIS — Z23 Encounter for immunization: Secondary | ICD-10-CM

## 2021-05-02 DIAGNOSIS — C9110 Chronic lymphocytic leukemia of B-cell type not having achieved remission: Secondary | ICD-10-CM | POA: Insufficient documentation

## 2021-05-02 DIAGNOSIS — Z299 Encounter for prophylactic measures, unspecified: Secondary | ICD-10-CM | POA: Diagnosis not present

## 2021-05-02 LAB — CBC WITH DIFFERENTIAL/PLATELET
Abs Immature Granulocytes: 0 10*3/uL (ref 0.00–0.07)
Basophils Absolute: 0.2 10*3/uL — ABNORMAL HIGH (ref 0.0–0.1)
Basophils Relative: 1 %
Eosinophils Absolute: 0 10*3/uL (ref 0.0–0.5)
Eosinophils Relative: 0 %
HCT: 39.9 % (ref 36.0–46.0)
Hemoglobin: 13.3 g/dL (ref 12.0–15.0)
Lymphocytes Relative: 78 %
Lymphs Abs: 15.2 10*3/uL — ABNORMAL HIGH (ref 0.7–4.0)
MCH: 34.2 pg — ABNORMAL HIGH (ref 26.0–34.0)
MCHC: 33.3 g/dL (ref 30.0–36.0)
MCV: 102.6 fL — ABNORMAL HIGH (ref 80.0–100.0)
Monocytes Absolute: 0.6 10*3/uL (ref 0.1–1.0)
Monocytes Relative: 3 %
Neutro Abs: 3.5 10*3/uL (ref 1.7–7.7)
Neutrophils Relative %: 18 %
Platelets: 118 10*3/uL — ABNORMAL LOW (ref 150–400)
RBC: 3.89 MIL/uL (ref 3.87–5.11)
RDW: 13.3 % (ref 11.5–15.5)
WBC: 19.5 10*3/uL — ABNORMAL HIGH (ref 4.0–10.5)
nRBC: 0 % (ref 0.0–0.2)

## 2021-05-02 LAB — COMPREHENSIVE METABOLIC PANEL
ALT: 29 U/L (ref 0–44)
AST: 27 U/L (ref 15–41)
Albumin: 3.9 g/dL (ref 3.5–5.0)
Alkaline Phosphatase: 94 U/L (ref 38–126)
Anion gap: 8 (ref 5–15)
BUN: 18 mg/dL (ref 8–23)
CO2: 24 mmol/L (ref 22–32)
Calcium: 9 mg/dL (ref 8.9–10.3)
Chloride: 107 mmol/L (ref 98–111)
Creatinine, Ser: 0.78 mg/dL (ref 0.44–1.00)
GFR, Estimated: >60 mL/min (ref >60)
Glucose, Bld: 133 mg/dL — ABNORMAL HIGH (ref 70–99)
Potassium: 4.1 mmol/L (ref 3.5–5.1)
Sodium: 139 mmol/L (ref 135–145)
Total Bilirubin: 1.2 mg/dL (ref 0.3–1.2)
Total Protein: 6.8 g/dL (ref 6.5–8.1)

## 2021-05-02 MED ORDER — INFLUENZA VAC A&B SA ADJ QUAD 0.5 ML IM PRSY
0.5000 mL | PREFILLED_SYRINGE | Freq: Once | INTRAMUSCULAR | Status: AC
  Administered 2021-05-02: 0.5 mL via INTRAMUSCULAR
  Filled 2021-05-02: qty 0.5

## 2021-05-03 ENCOUNTER — Encounter: Payer: Self-pay | Admitting: Hematology and Oncology

## 2021-05-03 NOTE — Assessment & Plan Note (Signed)
She has slight lymphocytosis since her last visit Her platelet count changes intermittently, could be due to CLL as well CT imaging study in February of this year did not show significant disease burden Observe closely for now

## 2021-05-03 NOTE — Progress Notes (Signed)
[facility] OFFICE PROGRESS NOTE  Patient Care Team: Isaac Bliss, Rayford Halsted, MD as PCP - General (Internal Medicine) Julianne Handler, NP (Inactive) as Nurse Practitioner (Hospice and Palliative Medicine)  ASSESSMENT & PLAN:  CLL (chronic lymphocytic leukemia) ([place]) She has slight lymphocytosis since her last visit Her platelet count changes intermittently, could be due to CLL as well CT imaging study in February of this year did not show significant disease burden Observe closely for now  Preventive measure We discussed the importance of preventive care and reviewed the vaccination programs. She does not have any prior allergic reactions to influenza vaccination. She agrees to proceed with influenza vaccination today and we will administer it today at the clinic.   No orders of the defined types were placed in this encounter.   All questions were answered. The patient knows to call the clinic with any problems, questions or concerns. The total time spent in the appointment was 20 minutes encounter with patients including review of chart and various tests results, discussions about plan of care and coordination of care plan   Heath Lark, MD 05/03/2021 11:18 AM  INTERVAL HISTORY: Please see below for problem oriented charting. she returns for CLL follow-up, on surveillance The patient has recurrent acute urinary tract infection over the past few months due to chronic urinary retention requiring intermittent self-catheterization She just completed a course of antibiotics Denies recent bleeding No new lymphadenopathy  REVIEW OF SYSTEMS:   Constitutional: Denies fevers, chills or abnormal weight loss Eyes: Denies blurriness of vision Ears, nose, mouth, throat, and face: Denies mucositis or sore throat Respiratory: Denies cough, dyspnea or wheezes Cardiovascular: Denies palpitation, chest discomfort or lower extremity swelling Gastrointestinal:  Denies nausea,  heartburn or change in bowel habits Skin: Denies abnormal skin rashes Lymphatics: Denies new lymphadenopathy or easy bruising Neurological:Denies numbness, tingling or new weaknesses Behavioral/Psych: Mood is stable, no new changes  All other systems were reviewed with the patient and are negative.  I have reviewed the past medical history, past surgical history, social history and family history with the patient and they are unchanged from previous note.  ALLERGIES:  is allergic to codeine and levofloxacin.  MEDICATIONS:  Current Outpatient Medications  Medication Sig Dispense Refill   COVID-19 mRNA Vac-TriS, Pfizer, (PFIZER-BIONT COVID-19 VAC-TRIS) SUSP injection Inject into the muscle. 0.3 mL 0   fluconazole (DIFLUCAN) 150 MG tablet Take 1 pill today and 2nd pill in 72 hours for yeast infection 2 tablet 0   loperamide (IMODIUM) 2 MG capsule Take 1 capsule (2 mg total) by mouth 4 (four) times daily as needed for diarrhea or loose stools. 8 capsule 0   ondansetron (ZOFRAN) 4 MG tablet Take 1 tablet (4 mg total) by mouth every 8 (eight) hours as needed for nausea or vomiting. (Patient not taking: Reported on 11/28/2020) 40 tablet 0   sulfamethoxazole-trimethoprim (BACTRIM DS) 800-160 MG tablet Take 1 tablet by mouth 2 (two) times daily. One PO BID x 3 days 6 tablet 0   traMADol (ULTRAM) 50 MG tablet Take 1 tablet (50 mg total) by mouth 3 (three) times daily as needed. 30 tablet 2   No current facility-administered medications for this visit.    SUMMARY OF ONCOLOGIC HISTORY: Oncology History Overview Note  Del 13 q   CLL (chronic lymphocytic leukemia) ([place])  03/15/2009 Pathology Results   Case #: [id number]  flow cytometry of peripheral blood comfirmed CLL.   04/25/2017 Pathology Results   FISH panel is positive for  deletion 13 q only   04/22/2019 Imaging   Ct scan of the chest, abdomen and pelvis 1. No adenopathy within the chest, abdomen or pelvis. 2. Splenomegaly with scattered  low-density lesions measuring up to 1.3 cm. 3. Small subpleural nodule in the left lower lobe and right middle lobe measuring up to 5 mm. No follow-up needed if patient is low-risk (and has no known or suspected primary neoplasm). Non-contrast chest CT can be considered in 12 months if patient is high-risk. This recommendation follows the consensus statement: Guidelines for Management of Incidental Pulmonary Nodules Detected on CT Images: From the Fleischner Society 2017; Radiology 2017; 284:228-243. 4. Aortic Atherosclerosis (ICD10-I70.0). Coronary artery atherosclerotic calcifications.     04/23/2019 Cancer Staging   Staging form: Chronic Lymphocytic Leukemia / Small Lymphocytic Lymphoma, AJCC 8th Edition - Clinical stage from 04/23/2019: Modified Rai Stage IV (Modified Rai risk: High, Lymphocytosis: Present, Adenopathy: Absent, Organomegaly: Present, Anemia: Absent, Thrombocytopenia: Present) - Signed by Heath Lark, MD on 04/23/2019   04/28/2019 -  Chemotherapy   The patient had Ibrutinib for chemotherapy treatment.     06/01/2019 -  Chemotherapy   The patient had Bendamustine for chemotherapy   06/02/2019 Imaging   Ct head, chest, abdomen and pelvis 1. New T8 compression deformity with approximately 30% height loss. Worsening anterior compression deformity of the T11 vertebrae now with 20% height loss anteriorly. Additional T12 and L2 compression deformities are stable from prior. 2. No other acute traumatic injury within the chest, abdomen, or pelvis. Specifically, no splenic injury or retroperitoneal hematoma. 3. Mild intra and extrahepatic biliary ductal dilatation with a small calcified gallstone seen in the distal common bile duct just proximal to the ampulla of Vater. Correlate with LFTs and consider further evaluation with MRCP as clinically indicated. 4. Stable sub 5 mm nodules in the right middle and left lower lobe. No further evaluation is required if patient is low risk however  should continue with previously recommended 12 month follow-up in September of 2021 if patient is high risk. 5. Severe scoliotic curvature of the thoracolumbar spine with associated chest wall deformity. 6. Duodenal and colonic diverticula without features of diverticulitis. 7. Moderate bladder distention, correlate for features of outlet obstruction or retention. 8. Aortic Atherosclerosis (ICD10-I70.0).   06/07/2019 - 06/10/2019 Hospital Admission   She was admitted for management of LUQ and back pain   06/08/2019 Imaging   MRI abdomen 1. Mild intra and extrahepatic biliary duct prominence with common bile duct measuring upper normal for patient age. No gallstones evident and no definite choledocholithiasis. Somewhat abrupt transition of the duct into the ampulla noted, but nonspecific. ERCP could be used to further evaluate for ampullary lesion as clinically warranted. 2. Hepatic and renal cysts. 3. Presumed marked distention of the urinary bladder.   06/08/2019 Imaging   Ct angiogram No gross evidence of large or central pulmonary emboli is identified on exam limited secondary to suboptimal pulmonary arterial opacification and respiratory motion.   Bibasilar atelectasis.   Osseous demineralization with compression fractures of several thoracic vertebra.   Aneurysmal dilatation ascending thoracic aorta 4.1 cm transverse, recommendation below.   Recommend annual imaging followup by CTA or MRA.   Aortic Atherosclerosis (ICD10-I70.0).   Aortic aneurysm NOS   10/09/2019 Imaging   1. No definite acute CT findings of the abdomen or pelvis to explain pain. 2. Very distended urinary bladder.  Correlate for urinary retention. 3. Large burden of stool in the distal colon and rectum. Correlate for constipation. 4. Unchanged  mild intrahepatic biliary ductal dilatation. Common bile duct normal in caliber measuring up to 6 mm. No obstructing lesion or calculi identified. 5. No mass or  lymphadenopathy in the abdomen or pelvis. 6. Aortic Atherosclerosis (ICD10-I70.0).     PHYSICAL EXAMINATION: ECOG PERFORMANCE STATUS: 1 - Symptomatic but completely ambulatory  Vitals:   05/02/21 1122  BP: (!) 156/74  Pulse: 73  Resp: 18  Temp: 97.6 F (36.4 C)  SpO2: 100%   Filed Weights   05/02/21 1122  Weight: 95 lb 9.6 oz (43.4 kg)    GENERAL:alert, no distress and comfortable NEURO: alert & oriented x 3 with fluent speech, no focal motor/sensory deficits  LABORATORY DATA:  I have reviewed the data as listed    Component Value Date/Time   NA 139 05/02/2021 1110   NA 141 04/25/2017 1041   K 4.1 05/02/2021 1110   K 4.6 04/25/2017 1041   CL 107 05/02/2021 1110   CL 104 10/27/2012 1209   CO2 24 05/02/2021 1110   CO2 26 04/25/2017 1041   GLUCOSE 133 (H) 05/02/2021 1110   GLUCOSE 97 04/25/2017 1041   GLUCOSE 104 (H) 10/27/2012 1209   BUN 18 05/02/2021 1110   BUN 13.1 04/25/2017 1041   CREATININE 0.78 05/02/2021 1110   CREATININE 0.77 03/08/2020 1030   CREATININE 0.8 04/25/2017 1041   CALCIUM 9.0 05/02/2021 1110   CALCIUM 9.8 04/25/2017 1041   PROT 6.8 05/02/2021 1110   PROT 7.5 04/25/2017 1041   ALBUMIN 3.9 05/02/2021 1110   ALBUMIN 4.4 04/25/2017 1041   AST 27 05/02/2021 1110   AST 23 04/25/2017 1041   ALT 29 05/02/2021 1110   ALT 14 04/25/2017 1041   ALKPHOS 94 05/02/2021 1110   ALKPHOS 69 04/25/2017 1041   BILITOT 1.2 05/02/2021 1110   BILITOT 0.95 04/25/2017 1041   GFRNONAA >60 05/02/2021 1110   GFRAA >60 12/03/2019 1055    No results found for: SPEP, UPEP  Lab Results  Component Value Date   WBC 19.5 (H) 05/02/2021   NEUTROABS 3.5 05/02/2021   HGB 13.3 05/02/2021   HCT 39.9 05/02/2021   MCV 102.6 (H) 05/02/2021   PLT 118 (L) 05/02/2021      Chemistry      Component Value Date/Time   NA 139 05/02/2021 1110   NA 141 04/25/2017 1041   K 4.1 05/02/2021 1110   K 4.6 04/25/2017 1041   CL 107 05/02/2021 1110   CL 104 10/27/2012 1209    CO2 24 05/02/2021 1110   CO2 26 04/25/2017 1041   BUN 18 05/02/2021 1110   BUN 13.1 04/25/2017 1041   CREATININE 0.78 05/02/2021 1110   CREATININE 0.77 03/08/2020 1030   CREATININE 0.8 04/25/2017 1041      Component Value Date/Time   CALCIUM 9.0 05/02/2021 1110   CALCIUM 9.8 04/25/2017 1041   ALKPHOS 94 05/02/2021 1110   ALKPHOS 69 04/25/2017 1041   AST 27 05/02/2021 1110   AST 23 04/25/2017 1041   ALT 29 05/02/2021 1110   ALT 14 04/25/2017 1041   BILITOT 1.2 05/02/2021 1110   BILITOT 0.95 04/25/2017 1041

## 2021-05-03 NOTE — Assessment & Plan Note (Signed)
We discussed the importance of preventive care and reviewed the vaccination programs. She does not have any prior allergic reactions to influenza vaccination. She agrees to proceed with influenza vaccination today and we will administer it today at the clinic.  

## 2021-05-08 ENCOUNTER — Other Ambulatory Visit: Payer: Self-pay | Admitting: Physician Assistant

## 2021-05-08 ENCOUNTER — Telehealth: Payer: Self-pay | Admitting: Orthopaedic Surgery

## 2021-05-08 MED ORDER — TRAMADOL HCL 50 MG PO TABS: 50.0000 mg | ORAL_TABLET | Freq: Three times a day (TID) | ORAL | 2 refills | Status: DC | PRN

## 2021-05-08 NOTE — Telephone Encounter (Signed)
Sent in

## 2021-05-08 NOTE — Telephone Encounter (Signed)
Pt called requesting a refill of tramadol. Please send to pharmacy on file. Pt phone number is [phone].

## 2021-05-10 DIAGNOSIS — N3000 Acute cystitis without hematuria: Secondary | ICD-10-CM | POA: Diagnosis not present

## 2021-05-10 DIAGNOSIS — R8271 Bacteriuria: Secondary | ICD-10-CM | POA: Diagnosis not present

## 2021-05-26 DIAGNOSIS — N3000 Acute cystitis without hematuria: Secondary | ICD-10-CM | POA: Diagnosis not present

## 2021-06-21 DIAGNOSIS — N3000 Acute cystitis without hematuria: Secondary | ICD-10-CM | POA: Diagnosis not present

## 2021-07-04 ENCOUNTER — Other Ambulatory Visit: Payer: Self-pay | Admitting: Internal Medicine

## 2021-07-04 DIAGNOSIS — E559 Vitamin D deficiency, unspecified: Secondary | ICD-10-CM

## 2021-07-17 NOTE — Progress Notes (Signed)
86 y.o. G0P0 Married Caucasian female here for breast and pelvic exam.    [facility] for frequent UTIs.  Taking antibiotic several times per month per patient.  Patient is concerned she may have a UTI.  The area feels swollen.   Having difficulty emptying her bladder.  Patient does self catheterization.  She does have yeast infections which make it difficult for her to void at times. Has an Rx for Macrobid and contacts urology twice a month regarding infection.  She tends to use the Macrobid for a week at a time.  She finished Macrobid 5 days ago.   She has burning and pain and difficulty voiding when the bladder infections occur.  PCP:  Lelon Frohlich, MD  Patient's last menstrual period was 03/26/1979.           Sexually active: No.  The current method of family planning is status post hysterectomy.    Exercising: No.  The patient does not participate in regular exercise at present. Smoker:  Former  Health Maintenance: Pap:  2011 normal History of abnormal Pap:  no MMG:  Bil.mastectomy Colonoscopy:  10 yrs ago--aged out BMD: 06-09-28  Result :Osteoporosis TDaP:  PCP Gardasil:   no HIV:no Hep C:no Screening Labs:  PCP and oncology.    reports that she quit smoking about 66 years ago. Her smoking use included cigarettes. She has never used smokeless tobacco. She reports that she does not currently use alcohol. She reports that she does not use drugs.  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[place])    INCREASED RISK FOR LYMPHOMA   Osteoporosis 05/2018   T score -3.1 overall stable from prior study    Past Surgical History:  Procedure Laterality Date   ABDOMINAL HYSTERECTOMY  1980   BREAST IMPLANTS REMOVED  2001   BREAST SURGERY     Bilateral mastectomy    IR RADIOLOGIST EVAL & MGMT  07/01/2019   MASTECTOMY  1982   BILATERAL   OOPHORECTOMY     BSO   PARTIAL COLECTOMY     INTESTINAL ABSCESS WITH SALPINGECTOMY   RECONSTRUCTION LEFT ELBOW     TONSILLECTOMY     UMBILLICAL HERNIA REPAIR      Current Outpatient Medications  Medication Sig Dispense Refill   COVID-19 mRNA Vac-TriS, Pfizer, (PFIZER-BIONT COVID-19 VAC-TRIS) SUSP injection Inject into the muscle. 0.3 mL 0   sulfamethoxazole-trimethoprim (BACTRIM DS) 800-160 MG tablet Take 1 tablet by mouth 2 (two) times daily. One PO BID x 3 days 6 tablet 0   traMADol (ULTRAM) 50 MG tablet Take 1 tablet (50 mg total) by mouth 3 (three) times daily as needed. 30 tablet 2   loperamide (IMODIUM) 2 MG capsule Take 1 capsule (2 mg total) by mouth 4 (four) times daily as needed for diarrhea or loose stools. (Patient not taking: Reported on 07/18/2021) 8 capsule 0   ondansetron (ZOFRAN) 4 MG tablet Take 1 tablet (4 mg total) by mouth every 8 (eight) hours as needed for nausea or vomiting. (Patient not taking: Reported on 07/18/2021) 40 tablet 0   No current facility-administered medications for this visit.    Family History  Problem Relation Age of Onset   Cancer Brother        [facility]  Cancer Brother        leukemia   Parkinson's disease Brother    Heart disease Mother    Heart disease Sister     Review of Systems  Genitourinary:        Doesn't feel she is completely emptying bladder -- uses in and out caths to help. [facility]  All other systems reviewed and are negative.  Exam:   BP 130/78    Pulse 83    Ht 4' 9.5" (1.461 m)    Wt 97 lb (44 kg)    LMP 03/26/1979    SpO2 99%    BMI 20.63 kg/m     General appearance: alert, cooperative and appears stated age Head: normocephalic, without obvious abnormality, atraumatic Neck: no adenopathy, supple, symmetrical, trachea midline and thyroid normal to inspection and palpation Lungs: clear to auscultation bilaterally Breasts:  absent Heart: regular rate and rhythm Abdomen: soft, non-tender; no masses, no organomegaly Extremities: extremities normal, atraumatic, no cyanosis or edema Skin: skin color, texture, turgor normal. No rashes or lesions Lymph nodes: cervical, supraclavicular, and axillary nodes normal. Neurologic: grossly normal  Pelvic: External genitalia:  no lesions              No abnormal inguinal nodes palpated.              Urethra:  normal appearing urethra with no masses, tenderness or lesions              Bartholins and Skenes: normal                 Vagina: normal appearing vagina with normal color and discharge, no lesions              Cervix: absent              Pap taken: no Bimanual Exam:  Uterus:  absent              Adnexa: no mass, fullness, tenderness              Rectal exam: yes.  Confirms.              Anus:  normal sphincter tone, no lesions  Chaperone was present for exam:  Estill Bamberg, CMA  Assessment:   Well woman visit with gynecologic exam. Status post TAH and BSO.  Osteoporosis.  Hx vertebral compression fracture of T8.  Hx Prolia use.  CLL.  Hx breast cancer.  Status post bilateral mastectomy.  Dysuria.  I suspect cystitis today. Vulvar irritation.  Plan: Pap not indicated.  [facility]. Wet prep:  negative for yeast clue cells, and trichomonas. Urinalysis and reflex culture.   Urine sg 1.020, ph 7.0, neg nitrites, 0 - 5 WBC, 0 - 2 RBC, 0 - 5 squams, many bacteria.   UC sent.  Bactrim DS po bid x 3 days.  Pyridium 100 mg po tid x 2 days prn. Contact the office for no improvement in 48 hours.   After visit summary provided.   30 min  total time was spent for this patient encounter, including preparation, face-to-face counseling with the patient, coordination of care, and documentation of the encounter.

## 2021-07-18 ENCOUNTER — Ambulatory Visit (INDEPENDENT_AMBULATORY_CARE_PROVIDER_SITE_OTHER): Payer: Medicare PPO | Admitting: Obstetrics and Gynecology

## 2021-07-18 ENCOUNTER — Other Ambulatory Visit: Payer: Self-pay

## 2021-07-18 ENCOUNTER — Encounter: Payer: Self-pay | Admitting: Obstetrics and Gynecology

## 2021-07-18 VITALS — BP 130/78 | HR 83 | Ht <= 58 in | Wt 97.0 lb

## 2021-07-18 DIAGNOSIS — R300 Dysuria: Secondary | ICD-10-CM

## 2021-07-18 DIAGNOSIS — M810 Age-related osteoporosis without current pathological fracture: Secondary | ICD-10-CM

## 2021-07-18 DIAGNOSIS — Z01419 Encounter for gynecological examination (general) (routine) without abnormal findings: Secondary | ICD-10-CM

## 2021-07-18 DIAGNOSIS — Z9189 Other specified personal risk factors, not elsewhere classified: Secondary | ICD-10-CM | POA: Diagnosis not present

## 2021-07-18 DIAGNOSIS — N3090 Cystitis, unspecified without hematuria: Secondary | ICD-10-CM

## 2021-07-18 DIAGNOSIS — N9089 Other specified noninflammatory disorders of vulva and perineum: Secondary | ICD-10-CM | POA: Diagnosis not present

## 2021-07-18 LAB — WET PREP FOR TRICH, YEAST, CLUE

## 2021-07-18 MED ORDER — SULFAMETHOXAZOLE-TRIMETHOPRIM 800-160 MG PO TABS: 1.0000 | ORAL_TABLET | Freq: Two times a day (BID) | ORAL | 0 refills | Status: DC

## 2021-07-18 MED ORDER — PHENAZOPYRIDINE HCL 100 MG PO TABS: 100.0000 mg | ORAL_TABLET | Freq: Three times a day (TID) | ORAL | 0 refills | Status: DC | PRN

## 2021-07-18 NOTE — Patient Instructions (Signed)

## 2021-07-20 LAB — URINALYSIS W MICROSCOPIC + REFLEX CULTURE
Bilirubin Urine: NEGATIVE
Glucose, UA: NEGATIVE
Hyaline Cast: NONE SEEN /LPF
Ketones, ur: NEGATIVE
Leukocyte Esterase: NEGATIVE
Nitrites, Initial: NEGATIVE
Protein, ur: NEGATIVE
Specific Gravity, Urine: 1.02 (ref 1.001–1.035)
pH: 7 (ref 5.0–8.0)

## 2021-07-20 LAB — NO CULTURE INDICATED

## 2021-07-20 LAB — URINE CULTURE
MICRO NUMBER:: 12780283
SPECIMEN QUALITY:: ADEQUATE

## 2021-08-02 ENCOUNTER — Other Ambulatory Visit: Payer: Self-pay | Admitting: Physician Assistant

## 2021-08-02 ENCOUNTER — Telehealth: Payer: Self-pay | Admitting: Orthopaedic Surgery

## 2021-08-02 DIAGNOSIS — R3914 Feeling of incomplete bladder emptying: Secondary | ICD-10-CM | POA: Diagnosis not present

## 2021-08-02 DIAGNOSIS — N3020 Other chronic cystitis without hematuria: Secondary | ICD-10-CM | POA: Diagnosis not present

## 2021-08-02 DIAGNOSIS — R1032 Left lower quadrant pain: Secondary | ICD-10-CM | POA: Diagnosis not present

## 2021-08-02 MED ORDER — TRAMADOL HCL 50 MG PO TABS
50.0000 mg | ORAL_TABLET | Freq: Three times a day (TID) | ORAL | 2 refills | Status: DC | PRN
Start: 2021-08-02 — End: 2021-11-06

## 2021-08-02 NOTE — Telephone Encounter (Signed)
Sent in

## 2021-08-02 NOTE — Telephone Encounter (Signed)
Pt would like refill on tramadol

## 2021-08-10 DIAGNOSIS — R8271 Bacteriuria: Secondary | ICD-10-CM | POA: Diagnosis not present

## 2021-08-10 DIAGNOSIS — N3000 Acute cystitis without hematuria: Secondary | ICD-10-CM | POA: Diagnosis not present

## 2021-08-22 ENCOUNTER — Ambulatory Visit: Payer: Medicare PPO | Admitting: Obstetrics and Gynecology

## 2021-08-22 ENCOUNTER — Encounter: Payer: Self-pay | Admitting: Obstetrics and Gynecology

## 2021-08-22 ENCOUNTER — Other Ambulatory Visit: Payer: Self-pay

## 2021-08-22 VITALS — BP 160/78 | HR 92 | Ht <= 58 in | Wt 97.0 lb

## 2021-08-22 DIAGNOSIS — R3129 Other microscopic hematuria: Secondary | ICD-10-CM | POA: Diagnosis not present

## 2021-08-22 DIAGNOSIS — R682 Dry mouth, unspecified: Secondary | ICD-10-CM | POA: Diagnosis not present

## 2021-08-22 DIAGNOSIS — N390 Urinary tract infection, site not specified: Secondary | ICD-10-CM | POA: Diagnosis not present

## 2021-08-22 DIAGNOSIS — N760 Acute vaginitis: Secondary | ICD-10-CM

## 2021-08-22 LAB — WET PREP FOR TRICH, YEAST, CLUE

## 2021-08-22 NOTE — Progress Notes (Signed)
GYNECOLOGY  VISIT   HPI: 87 y.o.   Married  Caucasian  female   G0P0 with Patient's last menstrual period was 03/26/1979.   here for vaginal irritation and pain. She also states she may have "thrush". Denies any sore throat, fever, shaking chills, or cough. She has recently completed antibiotics recently for a urinary tract infection.  Her throat feels cottony.   Symptoms started a few days ago.   Hx recurrent UTI.  Had E Coli UTI dx at this office 07/18/21.  Took a course of Bactrim DS. Seen at [facility] in January, and she completed a course of Macrobid for 7 days.  At the end of the course of abx, she has difficulty passing her urine and she feeling something is swollen.  She does self catheterization 4 - 5 times per day due to urinary retention.   No dysuria today.  Wants her urine checked for infection.  She left a sample today.   Some discharge.  No itching or odor.   No over the counter treatment.   GYNECOLOGIC HISTORY: Patient's last menstrual period was 03/26/1979. Contraception:  Hyst Menopausal hormone therapy:  none Last mammogram:   Bil.mastectomy Last pap smear:   2011 normal        OB History     Gravida  0   Para  0   Term      Preterm      AB      Living         SAB      IAB      Ectopic      Multiple      Live Births                 Patient Active Problem List   Diagnosis Date Noted   Yeast infection 10/25/2020   Urinary retention 10/25/2020   E. coli UTI (urinary tract infection) 10/17/2020   Dysuria 10/13/2020   Chronic diarrhea 10/13/2020   Chronic right-sided low back pain with right-sided sciatica 09/27/2020   Vitamin D deficiency 03/09/2020   Transaminitis 03/09/2020   Bladder distension 10/13/2019   Weight loss, abnormal 62/95/2841   Acute metabolic encephalopathy 32/44/0102   Other constipation 06/18/2019   Physical debility 06/18/2019   Abdominal distension 06/08/2019   Severe pain 06/08/2019    Intractable back pain 06/08/2019   Closed wedge compression fracture of T8 vertebra ([place]) 06/07/2019   Sinus tachycardia 06/07/2019   Intractable pain 06/07/2019   Goals of care, counseling/discussion 05/14/2019   Abnormal liver enzymes 05/14/2019   Dehydration 05/14/2019   Failure to thrive in adult 05/14/2019   Abdominal pain 04/16/2019   Cancer associated pain 04/16/2019   History of breast cancer 06/19/2018   Skin lesion 05/06/2017   Essential hypertension 72/53/6644   Systolic hypertension, isolated 11/16/2016   Central retinal vein occlusion 10/18/2016   Preventive measure 04/28/2015   Thrombocytopenia ([place]) 04/28/2015   Idiopathic scoliosis 10/23/2012   CLL (chronic lymphocytic leukemia) ([place]) 03/02/2009   VENOUS INSUFFICIENCY, CHRONIC 10/28/2008   Dyslipidemia 10/27/2007   Osteoporosis 10/27/2007   SKIN CANCER, HX OF 10/27/2007   DIVERTICULITIS, HX OF 04/08/2007    Past Medical History:  Diagnosis Date   Breast CA ([place])    Bronchitis, chronic ([place])    Compression fracture of body of thoracic vertebra ([place])    Eye hemorrhage, left    Leukemia, chronic lymphoid ([place])    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[place])    INCREASED RISK FOR LYMPHOMA  Osteoporosis 05/2018   T score -3.1 overall stable from prior study    Past Surgical History:  Procedure Laterality Date   ABDOMINAL HYSTERECTOMY  1980   BREAST IMPLANTS REMOVED  2001   BREAST SURGERY     Bilateral mastectomy   IR RADIOLOGIST EVAL & MGMT  07/01/2019   MASTECTOMY  1982   BILATERAL   OOPHORECTOMY     BSO   PARTIAL COLECTOMY     INTESTINAL ABSCESS WITH SALPINGECTOMY   RECONSTRUCTION LEFT ELBOW     TONSILLECTOMY     UMBILLICAL HERNIA REPAIR      Current Outpatient Medications  Medication Sig Dispense Refill   traMADol (ULTRAM) 50 MG tablet Take 1 tablet (50 mg total) by mouth 3 (three) times daily as needed. 30 tablet 2   loperamide (IMODIUM) 2 MG capsule Take 1 capsule (2 mg total) by mouth 4 (four) times daily  as needed for diarrhea or loose stools. (Patient not taking: Reported on 07/18/2021) 8 capsule 0   No current facility-administered medications for this visit.     ALLERGIES: Codeine and Levofloxacin  Family History  Problem Relation Age of Onset   Cancer Brother        HODGKINS   Cancer Brother        leukemia   Parkinson's disease Brother    Heart disease Mother    Heart disease Sister     Social History   Socioeconomic History   Marital status: Married    Spouse name: Not on file   Number of children: Not on file   Years of education: Not on file   Highest education level: Not on file  Occupational History   Not on file  Tobacco Use   Smoking status: Former    Types: Cigarettes    Quit date: 03/26/1955    Years since quitting: 66.4   Smokeless tobacco: Never  Vaping Use   Vaping Use: Never used  Substance and Sexual Activity   Alcohol use: Not Currently   Drug use: Never   Sexual activity: Not Currently    Partners: Male    Birth control/protection: Surgical    Comment: HYST-1st intercourse 87 yo-More than 5 partners  Other Topics Concern   Not on file  Social History Narrative   Not on file   Social Determinants of Health   Financial Resource Strain: Not on file  Food Insecurity: Not on file  Transportation Needs: Not on file  Physical Activity: Not on file  Stress: Not on file  Social Connections: Not on file  Intimate Partner Violence: Not on file    Review of Systems  HENT:         Patient thinks she may have thrush  Genitourinary:  Positive for vaginal pain (and irritation).  All other systems reviewed and are negative.  PHYSICAL EXAMINATION:    BP (!) 160/78    Pulse 92    Ht 4' 9.5" (1.461 m)    Wt 97 lb (44 kg)    LMP 03/26/1979    SpO2 98%    BMI 20.63 kg/m     General appearance: alert, cooperative and appears stated age Mouth:  no obvious lesions, no white coating of tongue or cheeks. Pelvic: External genitalia:  no lesions               Urethra:  normal appearing urethra with no masses, tenderness or lesions              Bartholins  and Skenes: normal                 Vagina: normal appearing vagina with normal color and discharge, atrophy noted, no lesions              Cervix: absent                Bimanual Exam:  Uterus:  absent              Adnexa: no mass, fullness, tenderness      Chaperone was present for exam:  Estill Bamberg, CMA  ASSESSMENT  Status post TAH and BSO.  Vaginitis.  Does have atrophy no exam.  Hx recurrent UTI. Microscopic hematuria today. Urinary retention.  Does regular self cath. Dry mouth.  No sign of thrush.  Hx breast cancer.   PLAN  Wet prep:  negative yeast , negative clue cells, negative trichomonas.  We talked about avoid irritants that may cause sensitivity of the vagina/vulva.   Urinalysis:  sg 1.015, pH 5.5, neg nitrites, NS WBC, 20 - 40 RBC, 0 - 5 squams, NS bacteria. UC sent.  Patient prefers to wait for final urine culture prior to taking any abx.  Microscopic hematuria may be due to trauma from doing self cath.  Increase oral hydration with water.    An After Visit Summary was printed and given to the patient.  33 min  total time was spent for this patient encounter, including preparation, face-to-face counseling with the patient, coordination of care, and documentation of the encounter.

## 2021-08-24 LAB — URINE CULTURE
MICRO NUMBER:: 12911711
SPECIMEN QUALITY:: ADEQUATE

## 2021-08-24 LAB — URINALYSIS, COMPLETE W/RFL CULTURE
Bacteria, UA: NONE SEEN /HPF
Bilirubin Urine: NEGATIVE
Casts: NONE SEEN /LPF
Crystals: NONE SEEN /HPF
Glucose, UA: NEGATIVE
Hyaline Cast: NONE SEEN /LPF
Ketones, ur: NEGATIVE
Leukocyte Esterase: NEGATIVE
Nitrites, Initial: NEGATIVE
Protein, ur: NEGATIVE
Specific Gravity, Urine: 1.015 (ref 1.001–1.035)
WBC, UA: NONE SEEN /HPF (ref 0–5)
Yeast: NONE SEEN /HPF
pH: 5.5 (ref 5.0–8.0)

## 2021-08-24 LAB — CULTURE INDICATED

## 2021-09-09 IMAGING — CT CT ABD-PELV W/ CM
2 of 5 series · 7 of 36 positions shown, 8 images · IV contrast (omnipaque)
Comparison: CT 04/22/2019
COMPARISON: CT 04/22/2019

Addendum:
CLINICAL DATA: Fall from standing, history of CLL, flank pain and
tenderness evaluate for splenic injury or retroperitoneal hematoma.
Left rib pain.

EXAM:
CT CHEST, ABDOMEN, AND PELVIS WITH CONTRAST
TECHNIQUE: Multidetector CT imaging of the chest, abdomen and pelvis was
performed following the standard protocol during bolus
administration of intravenous contrast.
CONTRAST:  100mL OMNIPAQUE IOHEXOL 300 MG/ML  SOLN

[Series 4: cap with · axial · 0.58mm/px · z∈[-662,-307]mm · 4 of 113 slices shown, 5 images]
[im 21/113  mediastinal]
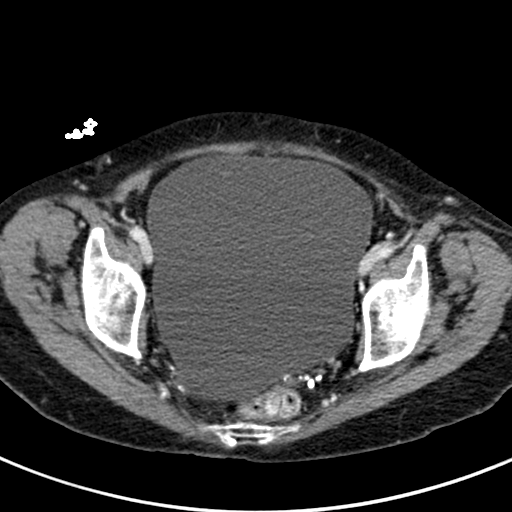
[im 21/113  lung]
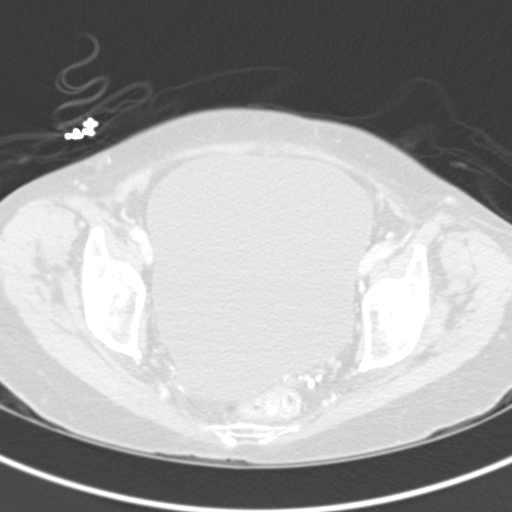
[im 41/113  lung]
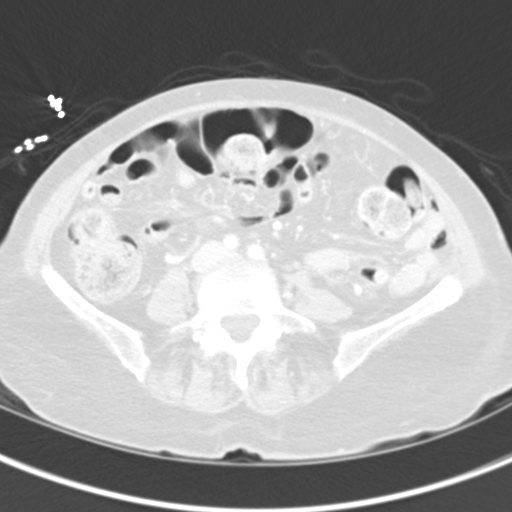
[im 72/113  lung]
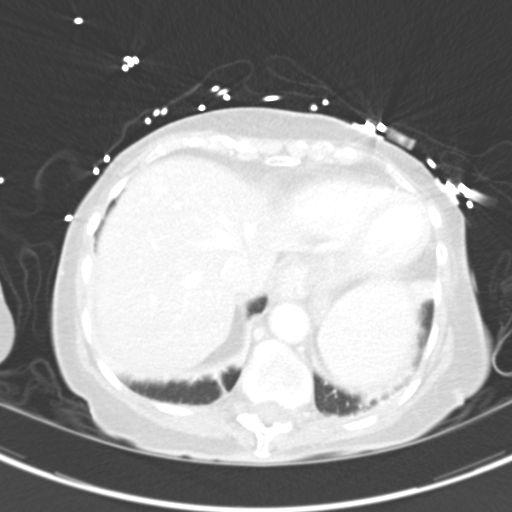
[im 92/113  lung]
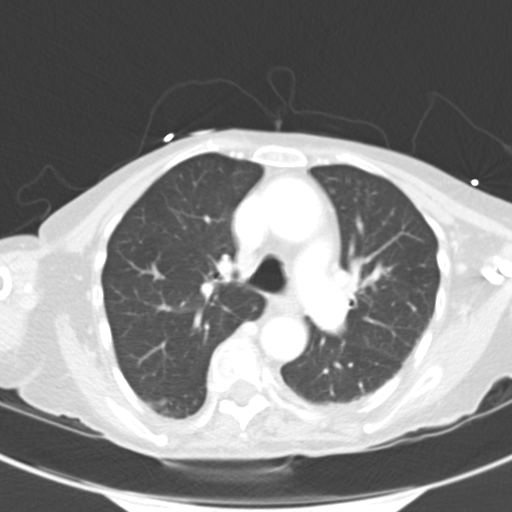

[Series 6: coronals · coronal · 0.60mm/px · 3 of 107 slices shown]
[im 22/107  lung]
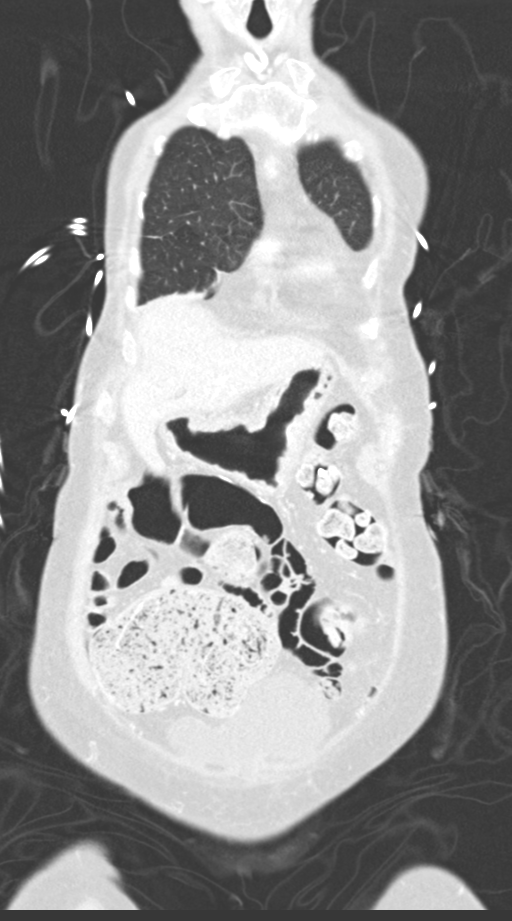
[im 43/107  lung]
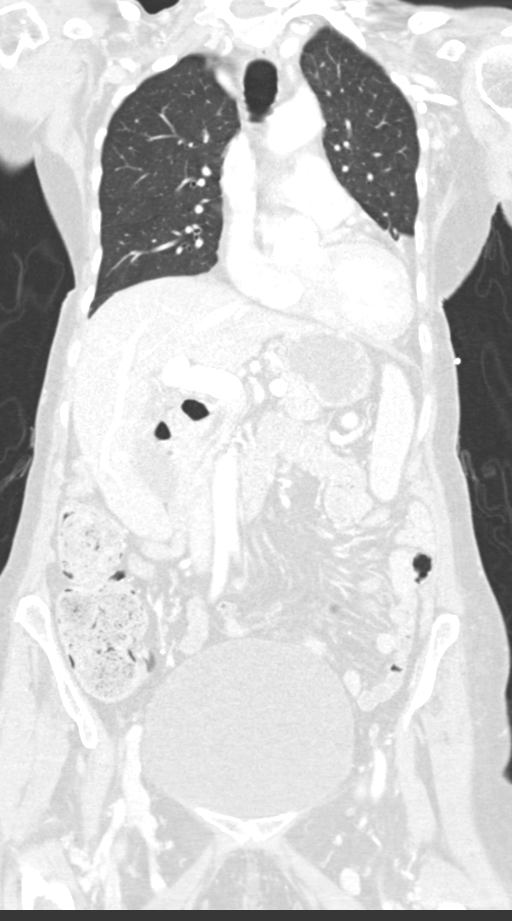
[im 64/107  lung]
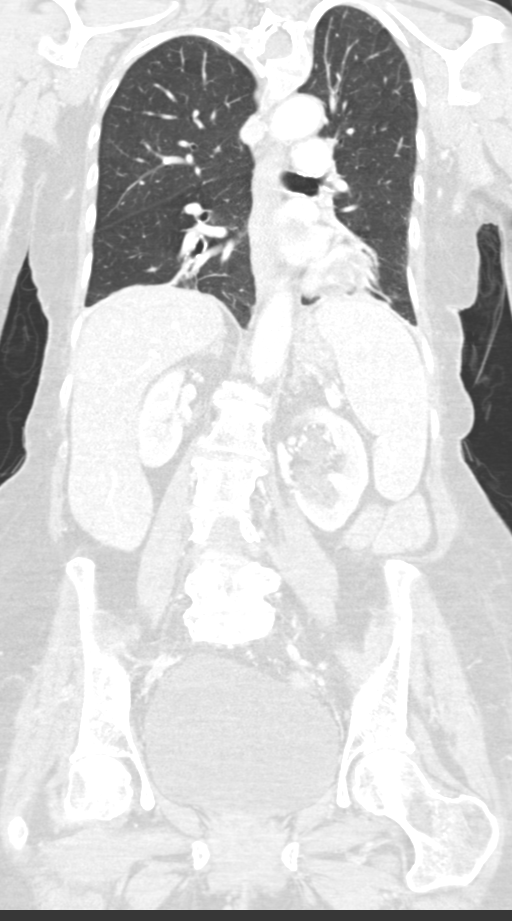

[7 of 36 positions shown; findings below may reference images not displayed]

FINDINGS: CT CHEST FINDINGS

Cardiovascular: The aortic root is suboptimally assessed given
cardiac pulsation artifact. No acute luminal abnormality of the
thoracic aorta. Minimal atheromatous plaque. No periaortic stranding
or hemorrhage. Slightly tortuous course of the thoracic aorta.
Normal 3 vessel branching of the arch mild tortuosity of the
brachiocephalic vessels. Proximal great vessels are otherwise
unremarkable. Normal heart size. No pericardial effusion. Coronary
artery calcifications are noted. Central pulmonary arteries are
normal caliber without large central filling defects on this non
tailored examination.

Mediastinum/Nodes: Thyroid gland and thoracic inlet are
unremarkable. No mediastinal hematoma or pneumomediastinum. No acute
traumatic abnormality of the trachea or esophagus. No mediastinal,
hilar or axillary adenopathy.

Lungs/Pleura: Mild interlobular septal thickening is noted towards
the lung apices and in the lung bases as well. Bandlike areas of
opacity in the lower lobes and right middle lobe and lingula likely
reflect areas of scarring and/or atelectasis. More dependent
atelectasis is seen posteriorly. No effusion. No visible
pneumothorax. Few stable sub 5 mm nodules are present in the right
middle lobe (5/83, 5/65) and left lower lobe (5/87).

Musculoskeletal: Inferior endplate compression deformity of the T8
vertebral body with approximately 30% height loss. Slightly
increased anterior wedging compression deformity of the T11
vertebrae when compared to prior study from 04/22/2019 with 20%
height loss anteriorly. Anterior wedging of the T12 vertebrae is
unchanged. No other acute vertebral fracture or compression
deformity is seen remote posttraumatic deformity of left mid
clavicle. Additional remote posttraumatic deformities of the left
ninth through eleventh ribs are noted. When compared to no acute
displaced rib fractures. No other acute or suspicious osseous
lesions. Severe dextrocurvature of the midthoracic spine with
compensatory curvature of the lower thoracic spine and associated
chest wall deformity.

CT ABDOMEN PELVIS FINDINGS

Hepatobiliary: No direct hepatic injury or perihepatic hematoma.
Subcentimeter hypoattenuating focus along the anterior dome of the
liver (4/40) too small to fully characterize on CT imaging but
statistically likely benign. No concerning hepatic lesions.
Gallbladder is normal. No pericholecystic inflammation or fluid.
There is mild intra and extrahepatic biliary ductal dilatation with
a small calcified gallstone seen in the distal common bile duct just
proximal to the ampulla of Vater (4/60-61).

Pancreas: Mild pancreatic atrophy. No pancreatic ductal dilatation
or surrounding inflammatory changes.

Spleen: No direct splenic injury or perisplenic hematoma. No
suspicious splenic lesions. Spleen at the upper limits of normal for
size.

Adrenals/Urinary Tract: No adrenal hemorrhage or suspicious adrenal
lesions.

No direct renal injury or perirenal hemorrhage. No extravasation of
contrast is seen on excretory phase delayed imaging. Multiple fluid
attenuation parapelvic left renal cysts are similar to comparison CT
from Sunday March, 2019. Kidneys are otherwise unremarkable, without
renal calculi, suspicious lesion, or hydronephrosis. Bladder is
moderately distended but otherwise unremarkable.

Stomach/Bowel: Distal esophagus and stomach are unremarkable.
Air-filled 2 cm duodenal diverticulum (4/53), slightly more
distended than on prior study but without adjacent inflammation. No
small bowel dilatation or wall thickening. Cecum is displaced into
the midline pelvis the appendix is not well visualized though no
pericecal inflammation is seen. There is a moderate stool burden
throughout the colon. No colonic dilatation or wall thickening
however. Colonic anastomosis noted in the midline upper abdomen.
Distal colonic diverticulosis without acute peridiverticular
inflammation to suggest diverticulitis.

Vascular/Lymphatic: Atherosclerotic plaque within the normal caliber
aorta. No suspicious or enlarged lymph nodes in the included
lymphatic chains.

Reproductive: The prostate and seminal vesicles are unremarkable.

Other: No free fluid or free air. No bowel containing hernias. No
evidence of retroperitoneal or body wall hematoma. No significant
soft tissue contusion though portions of the lateral most flanks are
partially collimated from view due to patient size.

Musculoskeletal: Superior endplate compression deformity of L2 is
unchanged from comparison CT 04/22/2019. Dextrocurvature of the
lumbar spine centered at L3. Multilevel degenerative changes are
present in the imaged portions of the spine. Features most
pronounced at L1-2 and L5-S1.
IMPRESSION: 1. New T8 compression deformity with approximately 30% height loss.
Worsening anterior compression deformity of the T11 vertebrae now
with 20% height loss anteriorly. Additional T12 and L2 compression
deformities are stable from prior.
2. No other acute traumatic injury within the chest, abdomen, or
pelvis. Specifically, no splenic injury or retroperitoneal hematoma.
3. Mild intra and extrahepatic biliary ductal dilatation with a
small calcified gallstone seen in the distal common bile duct just
proximal to the ampulla of Vater. Correlate with LFTs and consider
further evaluation with MRCP as clinically indicated.
4. Stable sub 5 mm nodules in the right middle and left lower lobe.
No further evaluation is required if patient is low risk however
should continue with previously recommended 12 month follow-up in
Monday March, 2020 if patient is high risk.
5. Severe scoliotic curvature of the thoracolumbar spine with
associated chest wall deformity.
6. Duodenal and colonic diverticula without features of
diverticulitis.
7. Moderate bladder distention, correlate for features of outlet
obstruction or retention.
8. Aortic Atherosclerosis ([id number]-PXK.K).

ADDENDUM:
Addendum to the initial report for transcription error:
FINDINGS: CT ABDOMEN AND PELVIS FINDINGS

Reproductive: The uterus and ovaries appear surgically absent. No
concerning adnexal lesions are present.

*** End of Addendum ***
FINDINGS: CT CHEST FINDINGS

Cardiovascular: The aortic root is suboptimally assessed given
cardiac pulsation artifact. No acute luminal abnormality of the
thoracic aorta. Minimal atheromatous plaque. No periaortic stranding
or hemorrhage. Slightly tortuous course of the thoracic aorta.
Normal 3 vessel branching of the arch mild tortuosity of the
brachiocephalic vessels. Proximal great vessels are otherwise
unremarkable. Normal heart size. No pericardial effusion. Coronary
artery calcifications are noted. Central pulmonary arteries are
normal caliber without large central filling defects on this non
tailored examination.

Mediastinum/Nodes: Thyroid gland and thoracic inlet are
unremarkable. No mediastinal hematoma or pneumomediastinum. No acute
traumatic abnormality of the trachea or esophagus. No mediastinal,
hilar or axillary adenopathy.

Lungs/Pleura: Mild interlobular septal thickening is noted towards
the lung apices and in the lung bases as well. Bandlike areas of
opacity in the lower lobes and right middle lobe and lingula likely
reflect areas of scarring and/or atelectasis. More dependent
atelectasis is seen posteriorly. No effusion. No visible
pneumothorax. Few stable sub 5 mm nodules are present in the right
middle lobe (5/83, 5/65) and left lower lobe (5/87).

Musculoskeletal: Inferior endplate compression deformity of the T8
vertebral body with approximately 30% height loss. Slightly
increased anterior wedging compression deformity of the T11
vertebrae when compared to prior study from 04/22/2019 with 20%
height loss anteriorly. Anterior wedging of the T12 vertebrae is
unchanged. No other acute vertebral fracture or compression
deformity is seen remote posttraumatic deformity of left mid
clavicle. Additional remote posttraumatic deformities of the left
ninth through eleventh ribs are noted. When compared to no acute
displaced rib fractures. No other acute or suspicious osseous
lesions. Severe dextrocurvature of the midthoracic spine with
compensatory curvature of the lower thoracic spine and associated
chest wall deformity.

CT ABDOMEN PELVIS FINDINGS

Hepatobiliary: No direct hepatic injury or perihepatic hematoma.
Subcentimeter hypoattenuating focus along the anterior dome of the
liver (4/40) too small to fully characterize on CT imaging but
statistically likely benign. No concerning hepatic lesions.
Gallbladder is normal. No pericholecystic inflammation or fluid.
There is mild intra and extrahepatic biliary ductal dilatation with
a small calcified gallstone seen in the distal common bile duct just
proximal to the ampulla of Vater (4/60-61).

Pancreas: Mild pancreatic atrophy. No pancreatic ductal dilatation
or surrounding inflammatory changes.

Spleen: No direct splenic injury or perisplenic hematoma. No
suspicious splenic lesions. Spleen at the upper limits of normal for
size.

Adrenals/Urinary Tract: No adrenal hemorrhage or suspicious adrenal
lesions.

No direct renal injury or perirenal hemorrhage. No extravasation of
contrast is seen on excretory phase delayed imaging. Multiple fluid
attenuation parapelvic left renal cysts are similar to comparison CT
from Sunday March, 2019. Kidneys are otherwise unremarkable, without
renal calculi, suspicious lesion, or hydronephrosis. Bladder is
moderately distended but otherwise unremarkable.

Stomach/Bowel: Distal esophagus and stomach are unremarkable.
Air-filled 2 cm duodenal diverticulum (4/53), slightly more
distended than on prior study but without adjacent inflammation. No
small bowel dilatation or wall thickening. Cecum is displaced into
the midline pelvis the appendix is not well visualized though no
pericecal inflammation is seen. There is a moderate stool burden
throughout the colon. No colonic dilatation or wall thickening
however. Colonic anastomosis noted in the midline upper abdomen.
Distal colonic diverticulosis without acute peridiverticular
inflammation to suggest diverticulitis.

Vascular/Lymphatic: Atherosclerotic plaque within the normal caliber
aorta. No suspicious or enlarged lymph nodes in the included
lymphatic chains.

Reproductive: The prostate and seminal vesicles are unremarkable.

Other: No free fluid or free air. No bowel containing hernias. No
evidence of retroperitoneal or body wall hematoma. No significant
soft tissue contusion though portions of the lateral most flanks are
partially collimated from view due to patient size.

Musculoskeletal: Superior endplate compression deformity of L2 is
unchanged from comparison CT 04/22/2019. Dextrocurvature of the
lumbar spine centered at L3. Multilevel degenerative changes are
present in the imaged portions of the spine. Features most
pronounced at L1-2 and L5-S1.
IMPRESSION: 1. New T8 compression deformity with approximately 30% height loss.
Worsening anterior compression deformity of the T11 vertebrae now
with 20% height loss anteriorly. Additional T12 and L2 compression
deformities are stable from prior.
2. No other acute traumatic injury within the chest, abdomen, or
pelvis. Specifically, no splenic injury or retroperitoneal hematoma.
3. Mild intra and extrahepatic biliary ductal dilatation with a
small calcified gallstone seen in the distal common bile duct just
proximal to the ampulla of Vater. Correlate with LFTs and consider
further evaluation with MRCP as clinically indicated.
4. Stable sub 5 mm nodules in the right middle and left lower lobe.
No further evaluation is required if patient is low risk however
should continue with previously recommended 12 month follow-up in
Monday March, 2020 if patient is high risk.
5. Severe scoliotic curvature of the thoracolumbar spine with
associated chest wall deformity.
6. Duodenal and colonic diverticula without features of
diverticulitis.
7. Moderate bladder distention, correlate for features of outlet
obstruction or retention.
8. Aortic Atherosclerosis ([id number].K).

## 2021-09-26 ENCOUNTER — Telehealth: Payer: Self-pay

## 2021-09-26 DIAGNOSIS — N3000 Acute cystitis without hematuria: Secondary | ICD-10-CM | POA: Diagnosis not present

## 2021-09-26 DIAGNOSIS — R338 Other retention of urine: Secondary | ICD-10-CM | POA: Diagnosis not present

## 2021-09-26 DIAGNOSIS — N3020 Other chronic cystitis without hematuria: Secondary | ICD-10-CM | POA: Diagnosis not present

## 2021-09-26 DIAGNOSIS — R8271 Bacteriuria: Secondary | ICD-10-CM | POA: Diagnosis not present

## 2021-09-26 NOTE — Telephone Encounter (Signed)
Info on Palliative care emailed to patient.

## 2021-10-18 DIAGNOSIS — N3020 Other chronic cystitis without hematuria: Secondary | ICD-10-CM | POA: Diagnosis not present

## 2021-10-18 DIAGNOSIS — R8271 Bacteriuria: Secondary | ICD-10-CM | POA: Diagnosis not present

## 2021-10-31 ENCOUNTER — Inpatient Hospital Stay: Payer: Medicare PPO | Admitting: Hematology and Oncology

## 2021-10-31 ENCOUNTER — Other Ambulatory Visit: Payer: Medicare PPO

## 2021-10-31 ENCOUNTER — Other Ambulatory Visit: Payer: Self-pay

## 2021-10-31 ENCOUNTER — Ambulatory Visit: Payer: Medicare PPO | Admitting: Hematology and Oncology

## 2021-10-31 ENCOUNTER — Inpatient Hospital Stay: Payer: Medicare PPO | Attending: Hematology and Oncology

## 2021-10-31 DIAGNOSIS — D696 Thrombocytopenia, unspecified: Secondary | ICD-10-CM

## 2021-10-31 DIAGNOSIS — C9110 Chronic lymphocytic leukemia of B-cell type not having achieved remission: Secondary | ICD-10-CM | POA: Insufficient documentation

## 2021-10-31 LAB — CBC WITH DIFFERENTIAL/PLATELET
Abs Immature Granulocytes: 0.03 10*3/uL (ref 0.00–0.07)
Basophils Absolute: 0.1 10*3/uL (ref 0.0–0.1)
Basophils Relative: 0 %
Eosinophils Absolute: 0.1 10*3/uL (ref 0.0–0.5)
Eosinophils Relative: 0 %
HCT: 37 % (ref 36.0–46.0)
Hemoglobin: 12.5 g/dL (ref 12.0–15.0)
Immature Granulocytes: 0 %
Lymphocytes Relative: 80 %
Lymphs Abs: 19.6 10*3/uL — ABNORMAL HIGH (ref 0.7–4.0)
MCH: 34.5 pg — ABNORMAL HIGH (ref 26.0–34.0)
MCHC: 33.8 g/dL (ref 30.0–36.0)
MCV: 102.2 fL — ABNORMAL HIGH (ref 80.0–100.0)
Monocytes Absolute: 2.7 10*3/uL — ABNORMAL HIGH (ref 0.1–1.0)
Monocytes Relative: 11 %
Neutro Abs: 2.3 10*3/uL (ref 1.7–7.7)
Neutrophils Relative %: 9 %
Platelets: 102 10*3/uL — ABNORMAL LOW (ref 150–400)
RBC: 3.62 MIL/uL — ABNORMAL LOW (ref 3.87–5.11)
RDW: 13.8 % (ref 11.5–15.5)
Smear Review: NORMAL
WBC: 24.7 10*3/uL — ABNORMAL HIGH (ref 4.0–10.5)
nRBC: 0.1 % (ref 0.0–0.2)

## 2021-10-31 LAB — COMPREHENSIVE METABOLIC PANEL
ALT: 13 U/L (ref 0–44)
AST: 19 U/L (ref 15–41)
Albumin: 4.2 g/dL (ref 3.5–5.0)
Alkaline Phosphatase: 59 U/L (ref 38–126)
Anion gap: 5 (ref 5–15)
BUN: 15 mg/dL (ref 8–23)
CO2: 28 mmol/L (ref 22–32)
Calcium: 9 mg/dL (ref 8.9–10.3)
Chloride: 109 mmol/L (ref 98–111)
Creatinine, Ser: 0.63 mg/dL (ref 0.44–1.00)
GFR, Estimated: >60 mL/min (ref >60)
Glucose, Bld: 86 mg/dL (ref 70–99)
Potassium: 3.8 mmol/L (ref 3.5–5.1)
Sodium: 142 mmol/L (ref 135–145)
Total Bilirubin: 0.9 mg/dL (ref 0.3–1.2)
Total Protein: 6.7 g/dL (ref 6.5–8.1)

## 2021-11-01 ENCOUNTER — Encounter: Payer: Self-pay | Admitting: Hematology and Oncology

## 2021-11-01 NOTE — Assessment & Plan Note (Signed)
The cause could be related to her underlying medical condition. It is mild and there is little change compared from previous platelet count. The patient denies recent history of bleeding such as epistaxis, hematuria or hematochezia. She is asymptomatic from the thrombocytopenia. I will observe for now.  she does not require further evaluation or treatment for this  

## 2021-11-01 NOTE — Assessment & Plan Note (Signed)
I have reviewed CBC with the patient ?She has clinical signs of relapse but remain asymptomatic ?There is no clinical benefit of starting treatment right now ?Previously, her chemotherapy treatment was discontinued prematurely due to severe side effects ?I recommend close surveillance with return appointment in 6 months ?The patient is warned that she will likely need to return back to treatment within the next 6 to 12 months ?

## 2021-11-01 NOTE — Progress Notes (Signed)
[facility] ?OFFICE PROGRESS NOTE ? ?Patient Care Team: ?Isaac Bliss, Rayford Halsted, MD as PCP - General (Internal Medicine) ?Julianne Handler, NP (Inactive) as Nurse Practitioner [facility] and Palliative Medicine) ? ?ASSESSMENT & PLAN:  ?CLL (chronic lymphocytic leukemia) ([place]) ?I have reviewed CBC with the patient ?She has clinical signs of relapse but remain asymptomatic ?There is no clinical benefit of starting treatment right now ?Previously, her chemotherapy treatment was discontinued prematurely due to severe side effects ?I recommend close surveillance with return appointment in 6 months ?The patient is warned that she will likely need to return back to treatment within the next 6 to 12 months ? ?Thrombocytopenia ([place]) ?The cause could be related to her underlying medical condition. It is mild and there is little change compared from previous platelet count. The patient denies recent history of bleeding such as epistaxis, hematuria or hematochezia. She is asymptomatic from the thrombocytopenia. I will observe for now.  she does not require further evaluation or treatment for this ? ? ?No orders of the defined types were placed in this encounter. ? ? ?All questions were answered. The patient knows to call the clinic with any problems, questions or concerns. ?The total time spent in the appointment was 20 minutes encounter with patients including review of chart and various tests results, discussions about plan of care and coordination of care plan ?  ?Heath Lark, MD ?11/01/2021 12:54 PM ? ?INTERVAL HISTORY: ?Please see below for problem oriented charting. ?she returns for surveillance follow-up for CLL ?Since last time I saw her, she has not been hospitalized ?She continues to have problems with her bladder ?No new lymphadenopathy ?She denies abnormal bleeding, weight change or others ? ?REVIEW OF SYSTEMS:   ?Constitutional: Denies fevers, chills or abnormal weight loss ?Eyes: Denies blurriness of  vision ?Ears, nose, mouth, throat, and face: Denies mucositis or sore throat ?Respiratory: Denies cough, dyspnea or wheezes ?Cardiovascular: Denies palpitation, chest discomfort or lower extremity swelling ?Gastrointestinal:  Denies nausea, heartburn or change in bowel habits ?Skin: Denies abnormal skin rashes ?Lymphatics: Denies new lymphadenopathy or easy bruising ?Neurological:Denies numbness, tingling or new weaknesses ?Behavioral/Psych: Mood is stable, no new changes  ?All other systems were reviewed with the patient and are negative. ? ?I have reviewed the past medical history, past surgical history, social history and family history with the patient and they are unchanged from previous note. ? ?ALLERGIES:  is allergic to codeine and levofloxacin. ? ?MEDICATIONS:  ?Current Outpatient Medications  ?Medication Sig Dispense Refill  ? loperamide (IMODIUM) 2 MG capsule Take 1 capsule (2 mg total) by mouth 4 (four) times daily as needed for diarrhea or loose stools. (Patient not taking: Reported on 07/18/2021) 8 capsule 0  ? traMADol (ULTRAM) 50 MG tablet Take 1 tablet (50 mg total) by mouth 3 (three) times daily as needed. 30 tablet 2  ? ?No current facility-administered medications for this visit.  ? ? ?SUMMARY OF ONCOLOGIC HISTORY: ?Oncology History Overview Note  ?Del 13 q ?  ?CLL (chronic lymphocytic leukemia) ([place])  ?03/15/2009 Pathology Results  ? Case #: [id number]  flow cytometry of peripheral blood comfirmed CLL. ?  ?04/25/2017 Pathology Results  ? FISH panel is positive for deletion 13 q only ?  ?04/22/2019 Imaging  ? Ct scan of the chest, abdomen and pelvis ?1. No adenopathy within the chest, abdomen or pelvis. ?2. Splenomegaly with scattered low-density lesions measuring up to 1.3 cm. ?3. Small subpleural nodule in the left lower lobe and right middle  lobe measuring up to 5 mm. No follow-up needed if patient is low-risk (and has no known or suspected primary neoplasm). Non-contrast chest CT can be considered  in 12 months if patient is high-risk. This recommendation follows the consensus statement: Guidelines for Management of Incidental Pulmonary Nodules Detected on CT Images: From the Fleischner Society 2017; Radiology 2017; 284:228-243. ?4. Aortic Atherosclerosis (ICD10-I70.0). Coronary artery atherosclerotic calcifications. ?  ?  ?04/23/2019 Cancer Staging  ? Staging form: Chronic Lymphocytic Leukemia / Small Lymphocytic Lymphoma, AJCC 8th Edition ?- Clinical stage from 04/23/2019: Modified Rai Stage IV (Modified Rai risk: High, Lymphocytosis: Present, Adenopathy: Absent, Organomegaly: Present, Anemia: Absent, Thrombocytopenia: Present) - Signed by Heath Lark, MD on 04/23/2019 ? ?  ?04/28/2019 -  Chemotherapy  ? The patient had Ibrutinib for chemotherapy treatment.  ? ?  ?06/01/2019 -  Chemotherapy  ? The patient had Bendamustine for chemotherapy ?  ?06/02/2019 Imaging  ? Ct head, chest, abdomen and pelvis ?1. New T8 compression deformity with approximately 30% height loss. Worsening anterior compression deformity of the T11 vertebrae now with 20% height loss anteriorly. Additional T12 and L2 compression ?deformities are stable from prior. ?2. No other acute traumatic injury within the chest, abdomen, or pelvis. Specifically, no splenic injury or retroperitoneal hematoma. ?3. Mild intra and extrahepatic biliary ductal dilatation with a small calcified gallstone seen in the distal common bile duct just proximal to the ampulla of Vater. Correlate with LFTs and consider further evaluation with MRCP as clinically indicated. ?4. Stable sub 5 mm nodules in the right middle and left lower lobe. No further evaluation is required if patient is low risk however should continue with previously recommended 12 month follow-up in September of 2021 if patient is high risk. ?5. Severe scoliotic curvature of the thoracolumbar spine with associated chest wall deformity. ?6. Duodenal and colonic diverticula without features of  diverticulitis. ?7. Moderate bladder distention, correlate for features of outlet obstruction or retention. ?8. Aortic Atherosclerosis (ICD10-I70.0). ?  ?06/07/2019 - 06/10/2019 Hospital Admission  ? She was admitted for management of LUQ and back pain ?  ?06/08/2019 Imaging  ? MRI abdomen ?1. Mild intra and extrahepatic biliary duct prominence with common bile duct measuring upper normal for patient age. No gallstones evident and no definite choledocholithiasis. Somewhat abrupt transition of the duct into the ampulla noted, but nonspecific. ERCP could be used to further evaluate for ampullary lesion as clinically warranted. ?2. Hepatic and renal cysts. ?3. Presumed marked distention of the urinary bladder. ?  ?06/08/2019 Imaging  ? Ct angiogram ?No gross evidence of large or central pulmonary emboli is identified on exam limited secondary to suboptimal pulmonary arterial opacification and respiratory motion. ?  ?Bibasilar atelectasis. ?  ?Osseous demineralization with compression fractures of several thoracic vertebra. ?  ?Aneurysmal dilatation ascending thoracic aorta 4.1 cm transverse, recommendation below. ?  ?Recommend annual imaging followup by CTA or MRA.   ?Aortic Atherosclerosis (ICD10-I70.0). ?  ?Aortic aneurysm NOS ?  ?10/09/2019 Imaging  ? 1. No definite acute CT findings of the abdomen or pelvis to explain pain. ?2. Very distended urinary bladder.  Correlate for urinary retention. ?3. Large burden of stool in the distal colon and rectum. Correlate for constipation. ?4. Unchanged mild intrahepatic biliary ductal dilatation. Common bile duct normal in caliber measuring up to 6 mm. No obstructing lesion or calculi identified. ?5. No mass or lymphadenopathy in the abdomen or pelvis. ?6. Aortic Atherosclerosis (ICD10-I70.0). ?  ? ? ?PHYSICAL EXAMINATION: ?ECOG PERFORMANCE STATUS: 1 - Symptomatic but  completely ambulatory ? ?Vitals:  ? 10/31/21 1230  ?BP: (!) 147/74  ?Pulse: 81  ?Resp: 18  ?Temp: 99 ?F (37.2 ?C)   ?SpO2: 100%  ? ?Filed Weights  ? 10/31/21 1230  ?Weight: 100 lb 6.4 oz (45.5 kg)  ? ? ?GENERAL:alert, no distress and comfortable ?SKIN: skin color, texture, turgor are normal, no rashes or significant lesions ?EYE

## 2021-11-06 ENCOUNTER — Telehealth: Payer: Self-pay | Admitting: Physician Assistant

## 2021-11-06 ENCOUNTER — Other Ambulatory Visit: Payer: Self-pay | Admitting: Physician Assistant

## 2021-11-06 DIAGNOSIS — N3000 Acute cystitis without hematuria: Secondary | ICD-10-CM | POA: Diagnosis not present

## 2021-11-06 MED ORDER — TRAMADOL HCL 50 MG PO TABS: 50.0000 mg | ORAL_TABLET | Freq: Two times a day (BID) | ORAL | 2 refills | Status: DC | PRN

## 2021-11-06 NOTE — Telephone Encounter (Signed)
I called patient and advised. She will make an appointment with PCP to see if they will prescribe for her in the future. If not, she will call us back about pain management referral. ?

## 2021-11-06 NOTE — Telephone Encounter (Signed)
I will send in another rx with a refill, but if this is something she will continue to need, it would be best to get from pcp or we can refer to pain management

## 2021-11-06 NOTE — Telephone Encounter (Signed)
Pt called requesting a refill of Tramadol. Please send to pharmacy on file. Pt phone number is [phone]. ?

## 2021-11-11 ENCOUNTER — Other Ambulatory Visit: Payer: Self-pay | Admitting: Urology

## 2021-11-21 ENCOUNTER — Telehealth: Payer: Self-pay

## 2021-11-21 NOTE — Telephone Encounter (Signed)
Volunteer call to patient for palliative care. Patient went to Oncologist and will need more treatment ?

## 2021-12-05 DIAGNOSIS — N3000 Acute cystitis without hematuria: Secondary | ICD-10-CM | POA: Diagnosis not present

## 2021-12-05 DIAGNOSIS — R338 Other retention of urine: Secondary | ICD-10-CM | POA: Diagnosis not present

## 2021-12-12 ENCOUNTER — Telehealth: Payer: Self-pay

## 2021-12-12 ENCOUNTER — Ambulatory Visit (INDEPENDENT_AMBULATORY_CARE_PROVIDER_SITE_OTHER): Payer: Medicare PPO | Admitting: Internal Medicine

## 2021-12-12 ENCOUNTER — Encounter: Payer: Self-pay | Admitting: Internal Medicine

## 2021-12-12 VITALS — BP 120/80 | HR 94 | Temp 97.7°F | Ht <= 58 in | Wt 97.1 lb

## 2021-12-12 DIAGNOSIS — G8929 Other chronic pain: Secondary | ICD-10-CM

## 2021-12-12 DIAGNOSIS — I10 Essential (primary) hypertension: Secondary | ICD-10-CM | POA: Diagnosis not present

## 2021-12-12 DIAGNOSIS — R339 Retention of urine, unspecified: Secondary | ICD-10-CM

## 2021-12-12 DIAGNOSIS — B9620 Unspecified Escherichia coli [E. coli] as the cause of diseases classified elsewhere: Secondary | ICD-10-CM

## 2021-12-12 DIAGNOSIS — M546 Pain in thoracic spine: Secondary | ICD-10-CM | POA: Diagnosis not present

## 2021-12-12 DIAGNOSIS — M810 Age-related osteoporosis without current pathological fracture: Secondary | ICD-10-CM | POA: Diagnosis not present

## 2021-12-12 DIAGNOSIS — C9110 Chronic lymphocytic leukemia of B-cell type not having achieved remission: Secondary | ICD-10-CM | POA: Diagnosis not present

## 2021-12-12 DIAGNOSIS — Z853 Personal history of malignant neoplasm of breast: Secondary | ICD-10-CM | POA: Diagnosis not present

## 2021-12-12 DIAGNOSIS — N390 Urinary tract infection, site not specified: Secondary | ICD-10-CM | POA: Diagnosis not present

## 2021-12-12 DIAGNOSIS — Z0000 Encounter for general adult medical examination without abnormal findings: Secondary | ICD-10-CM

## 2021-12-12 LAB — COMPREHENSIVE METABOLIC PANEL
ALT: 21 U/L (ref 0–35)
AST: 25 U/L (ref 0–37)
Albumin: 4.7 g/dL (ref 3.5–5.2)
Alkaline Phosphatase: 80 U/L (ref 39–117)
BUN: 13 mg/dL (ref 6–23)
CO2: 28 mEq/L (ref 19–32)
Calcium: 9.9 mg/dL (ref 8.4–10.5)
Chloride: 101 mEq/L (ref 96–112)
Creatinine, Ser: 0.77 mg/dL (ref 0.40–1.20)
GFR: 70.84 mL/min (ref >60.00)
Glucose, Bld: 109 mg/dL — ABNORMAL HIGH (ref 70–99)
Potassium: 4.6 mEq/L (ref 3.5–5.1)
Sodium: 138 mEq/L (ref 135–145)
Total Bilirubin: 1.1 mg/dL (ref 0.2–1.2)
Total Protein: 7.5 g/dL (ref 6.0–8.3)

## 2021-12-12 LAB — LIPID PANEL
Cholesterol: 176 mg/dL (ref 0–200)
HDL: 63.1 mg/dL (ref >39.00)
LDL Cholesterol: 98 mg/dL (ref 0–99)
NonHDL: 112.76
Total CHOL/HDL Ratio: 3
Triglycerides: 76 mg/dL (ref 0.0–149.0)
VLDL: 15.2 mg/dL (ref 0.0–40.0)

## 2021-12-12 LAB — CBC WITH DIFFERENTIAL/PLATELET
Basophils Absolute: 0 10*3/uL (ref 0.0–0.1)
Basophils Relative: 0.2 % (ref 0.0–3.0)
Eosinophils Absolute: 0.1 10*3/uL (ref 0.0–0.7)
Eosinophils Relative: 0.3 % (ref 0.0–5.0)
HCT: 43.5 % (ref 36.0–46.0)
Hemoglobin: 14.4 g/dL (ref 12.0–15.0)
Lymphocytes Relative: 84.3 % — ABNORMAL HIGH (ref 12.0–46.0)
Lymphs Abs: 23.4 10*3/uL — ABNORMAL HIGH (ref 0.7–4.0)
MCHC: 33.1 g/dL (ref 30.0–36.0)
MCV: 104.9 fl — ABNORMAL HIGH (ref 78.0–100.0)
Monocytes Absolute: 0.6 10*3/uL (ref 0.1–1.0)
Monocytes Relative: 2.2 % — ABNORMAL LOW (ref 3.0–12.0)
Neutro Abs: 3.6 10*3/uL (ref 1.4–7.7)
Neutrophils Relative %: 13 % — ABNORMAL LOW (ref 43.0–77.0)
Platelets: 113 10*3/uL — ABNORMAL LOW (ref 150.0–400.0)
RBC: 4.15 Mil/uL (ref 3.87–5.11)
RDW: 13.7 % (ref 11.5–15.5)
WBC: 27.8 10*3/uL (ref 4.0–10.5)

## 2021-12-12 LAB — TSH: TSH: 1.32 u[IU]/mL (ref 0.35–5.50)

## 2021-12-12 LAB — VITAMIN D 25 HYDROXY (VIT D DEFICIENCY, FRACTURES): VITD: 26.32 ng/mL — ABNORMAL LOW (ref 30.00–100.00)

## 2021-12-12 LAB — VITAMIN B12: Vitamin B-12: 308 pg/mL (ref 211–911)

## 2021-12-12 NOTE — Progress Notes (Addendum)
Established Patient Office Visit     CC/Reason for Visit: Annual preventive exam and subsequent Medicare wellness visit  HPI: April [name] is a 87 y.o. female who is coming in today for the above mentioned reasons. Past Medical History is significant for: CLL that is now active not currently on treatment followed by oncology, remote history of breast cancer, history of recurrent UTIs who self catheterizes about 5 times a day followed by urology.  Osteoporosis.  She has a DEXA scan scheduled for later this month.  She is overdue for Tdap, shingles and COVID vaccination.  She continues to complain of chronic back pain that is thought related to her compression fractures.  She is following with orthopedics who prescribes her occasional tramadol.   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 Her smoking use included cigarettes. She has never used smokeless tobacco. She reports that she does not currently use alcohol. She reports that she does not use drugs.  Allergies: Allergies  Allergen Reactions   Codeine Nausea Only   Doxycycline Dermatitis   Levofloxacin Rash    Family History:   Family History  Problem Relation Age of Onset   Cancer Brother        HODGKINS   Cancer Brother        leukemia   Parkinson's disease Brother    Heart disease Mother    Heart disease Sister      Current Outpatient Medications:    loperamide (IMODIUM) 2 MG capsule, Take 1 capsule (2 mg total) by mouth 4 (four) times daily as needed for diarrhea or loose stools., Disp: 8 capsule, Rfl: 0   traMADol (ULTRAM) 50 MG tablet, Take 1 tablet (50 mg total) by mouth every 12 (twelve) hours as needed., Disp: 30 tablet, Rfl: 2  Review of Systems:  Constitutional: Denies fever, chills, diaphoresis, appetite change and fatigue.  HEENT: Denies photophobia, eye pain, redness, hearing loss, ear pain, congestion, sore throat, rhinorrhea, sneezing, mouth sores, trouble swallowing, neck pain, neck stiffness and tinnitus.   Respiratory: Denies SOB, DOE, cough, chest tightness,  and wheezing.   Cardiovascular: Denies chest pain, palpitations and leg swelling.  Gastrointestinal: Denies nausea, vomiting, abdominal pain, diarrhea, constipation, blood in stool and abdominal distention.  Endocrine: Denies: hot or cold intolerance, sweats, changes in hair or nails, polyuria, polydipsia. Musculoskeletal: Positive for myalgias, back pain, joint swelling, arthralgias and gait problem.  Skin: Denies pallor, rash and wound.  Neurological: Denies dizziness, seizures, syncope, weakness, light-headedness, numbness and headaches.  Hematological: Denies adenopathy. Easy bruising, personal or family bleeding history  Psychiatric/Behavioral: Denies suicidal ideation, mood changes, confusion, nervousness, sleep disturbance and agitation    Physical Exam: Vitals:   12/12/21 1311  BP: 120/80  Pulse: 94  Temp: 97.7 F (36.5 C)  TempSrc: Oral  SpO2: 96%  Weight: 97 lb 1.6 oz (44 kg)  Height: 4' 8.25" (1.429 m)    Body mass index is 21.58 kg/m.   Constitutional: NAD, calm, comfortable Eyes: PERRL, lids and  conjunctivae normal ENMT: Mucous membranes are moist. Posterior pharynx clear of any exudate or lesions. Normal dentition. Tympanic membrane is pearly white, no erythema or bulging. Neck: normal, supple, no masses, no thyromegaly Respiratory: clear to auscultation bilaterally, no wheezing, no crackles. Normal respiratory effort. No accessory muscle use.  Cardiovascular: Regular rate and rhythm, no murmurs / rubs / gallops. No extremity edema. 2+ pedal pulses. No carotid bruits.  Abdomen: no tenderness, no masses palpated. No hepatosplenomegaly. Bowel sounds positive.  Musculoskeletal: no clubbing / cyanosis. No joint deformity upper and lower extremities. Good ROM, no contractures. Normal muscle tone.  Skin: no rashes, lesions, ulcers. No induration Neurologic: CN 2-12 grossly intact. Sensation intact, DTR normal. Strength 5/5 in all 4.  Psychiatric: Normal judgment and insight. Alert and oriented x 3. Normal mood.   Subsequent Medicare wellness visit   1. Risk factors, based on past  M,S,F -cardiovascular disease risk factors include age   46.  Physical activities: Limited due to back pain and age   45.  Depression/mood: Stable, not depressed   4.  Hearing: No perceived issues   5.  ADL's: Independent in all ADLs   6.  Fall risk: Low fall risk   7.  Home safety: No problems identified   8.  Height weight, and visual acuity: height and weight as above, vision:  Vision Screening   Right eye Left eye Both eyes  Without correction 20/40 20/80 20/32-1  With correction        9.  Counseling: Advised to update immunization status   10. Lab orders based on risk factors: Laboratory update will be reviewed   11. Referral : None today   12. Care plan: Follow-up with me in 6 months   13. Cognitive assessment: No cognitive impairment   14. Screening: Patient provided with a written and personalized 5-10 year screening schedule in the AVS. yes   15. Provider List Update: PCP,  urologist, oncologist, orthopedics  16. Advance Directives: Full code   17. Opioids: Patient is on chronic opioids.  She obtains tramadol through her orthopedist's office.  Has not displayed any signs of an opioid-use disorder.   [place] Office Visit from 12/12/2021 in [facility] at [place]  PHQ-9 Total Score 0          08/31/2020   12:39 PM 10/13/2020   11:35 AM 10/25/2020    2:01 PM 10/31/2021   12:20 PM 12/12/2021    1:21 PM  [place] in the past year?   1  0  Was there an injury with Fall?   1  0  Fall Risk Category Calculator   3  0  Fall Risk Category   High  Low  Patient Fall Risk Level Low fall risk Low fall risk Moderate fall risk Low fall risk Low fall risk  Patient at Risk for Falls Due to   Impaired balance/gait;History of fall(s)  No Fall Risks  Fall risk Follow up   Falls evaluation completed  Falls evaluation completed     Impression and Plan:  Encounter for preventive health examination -Recommend routine eye and dental care. -Immunizations: She will get Tdap, COVID, shingles at pharmacy. -Healthy lifestyle discussed in detail. -Labs to be updated today. -Colon cancer screening: Defers due to age -Breast cancer screening: Status post bilateral mastectomy -Cervical cancer screening: Defers due to age -Lung cancer screening: Never smoker -Prostate cancer screening: Not applicable -DEXA: Scheduled for this month  Urinary retention E. coli UTI (urinary tract infection) -Chronic, followed by urology  History of breast cancer -Remote  CLL (chronic lymphocytic leukemia) ([place])  - Plan: CBC with Differential/Platelet, Comprehensive metabolic panel -Followed by oncology, she has sign of disease recurrence but not on active treatment.  Chronic bilateral thoracic back pain  - Plan: TSH, Vitamin B12, VITAMIN D 25 Hydroxy (Vit-D Deficiency, Fractures) -On chronic tramadol  Osteoporosis -Check DEXA and Vit D     Patient Instructions   -Nice seeing you today!!  -Lab work today; will notify you once results are available.  -Remember you tdap and COVID booster at the pharmacy.  -Schedule follow up in 6 months.      Lelon Frohlich, MD [facility] at [facility]

## 2021-12-12 NOTE — Patient Instructions (Signed)
-  Nice seeing you today!! ? ?-Lab work today; will notify you once results are available. ? ?-Remember you tdap and COVID booster at the pharmacy. ? ?-Schedule follow up in 6 months. ? ? ?

## 2021-12-12 NOTE — Telephone Encounter (Signed)
CRITICAL VALUE STICKER ? ?CRITICAL VALUE: WBC 27.8 ? ?RECEIVER (on-site recipient of call): Elza Rafter rn ? ?DATE & TIME NOTIFIED: 12/12/21 at 1702 ? ?MESSENGER (representative from lab): Tiera Reynolds ? ?MD NOTIFIED: Dr Elease Hashimoto ? ?TIME OF NOTIFICATION: 1703 ? ?RESPONSE: Awaiting Response ?

## 2021-12-13 ENCOUNTER — Other Ambulatory Visit: Payer: Self-pay | Admitting: *Deleted

## 2021-12-13 ENCOUNTER — Other Ambulatory Visit: Payer: Self-pay | Admitting: Internal Medicine

## 2021-12-13 DIAGNOSIS — E559 Vitamin D deficiency, unspecified: Secondary | ICD-10-CM

## 2021-12-13 MED ORDER — VITAMIN D (ERGOCALCIFEROL) 1.25 MG (50000 UNIT) PO CAPS: 50000.0000 [IU] | ORAL_CAPSULE | ORAL | 0 refills | Status: AC

## 2021-12-13 NOTE — Telephone Encounter (Signed)
Patient is aware ?Lab ordered and appointment scheduled ?

## 2021-12-19 ENCOUNTER — Ambulatory Visit
Admission: RE | Admit: 2021-12-19 | Discharge: 2021-12-19 | Disposition: A | Discharge status: Home / Self Care | Payer: Medicare PPO | Source: Ambulatory Visit | Attending: Obstetrics and Gynecology | Admitting: Obstetrics and Gynecology

## 2021-12-19 DIAGNOSIS — Z780 Asymptomatic menopausal state: Secondary | ICD-10-CM | POA: Diagnosis not present

## 2021-12-19 DIAGNOSIS — M810 Age-related osteoporosis without current pathological fracture: Secondary | ICD-10-CM

## 2021-12-20 ENCOUNTER — Ambulatory Visit: Payer: Medicare PPO | Admitting: Internal Medicine

## 2021-12-20 ENCOUNTER — Encounter: Payer: Self-pay | Admitting: Internal Medicine

## 2021-12-20 VITALS — BP 132/70 | HR 80 | Temp 97.6°F | Ht <= 58 in | Wt 97.6 lb

## 2021-12-20 DIAGNOSIS — M810 Age-related osteoporosis without current pathological fracture: Secondary | ICD-10-CM

## 2021-12-20 DIAGNOSIS — J029 Acute pharyngitis, unspecified: Secondary | ICD-10-CM | POA: Diagnosis not present

## 2021-12-20 LAB — POCT RAPID STREP A (OFFICE): Rapid Strep A Screen: NEGATIVE

## 2021-12-20 LAB — POC COVID19 BINAXNOW: SARS Coronavirus 2 Ag: NEGATIVE

## 2021-12-20 NOTE — Progress Notes (Signed)
Established Patient Office Visit     CC/Reason for Visit: Discuss results of DEXA scan, sore throat  HPI: April [name] is a 87 y.o. female who is coming in today for the above mentioned reasons.  Her DEXA scan showed worsening of osteoporosis with a left femoral neck score of -3.9 and a right forearm of -4.3.  She also woke up today with a sore throat, no other symptoms.  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 Her smoking use included cigarettes. She has never used smokeless tobacco. She reports that she does not currently use alcohol. She reports that she does not use drugs.  Allergies: Allergies  Allergen Reactions   Codeine Nausea Only   Doxycycline Dermatitis   Levofloxacin Rash    Family History:  Family History  Problem Relation Age of Onset   Cancer Brother        HODGKINS   Cancer Brother        leukemia   Parkinson's disease Brother    Heart disease Mother    Heart disease Sister      Current Outpatient Medications:    loperamide (IMODIUM) 2 MG capsule, Take 1 capsule (2 mg total) by mouth 4 (four) times daily as needed for diarrhea or loose stools., Disp: 8  capsule, Rfl: 0   traMADol (ULTRAM) 50 MG tablet, Take 1 tablet (50 mg total) by mouth every 12 (twelve) hours as needed., Disp: 30 tablet, Rfl: 2   Vitamin D, Ergocalciferol, (DRISDOL) 1.25 MG (50000 UNIT) CAPS capsule, Take 1 capsule (50,000 Units total) by mouth every 7 (seven) days for 12 doses., Disp: 12 capsule, Rfl: 0  Review of Systems:  Constitutional: Denies fever, chills, diaphoresis, appetite change and fatigue.  HEENT: Denies photophobia, eye pain, redness, hearing loss, ear pain, congestion,  rhinorrhea, sneezing, mouth sores, trouble swallowing, neck pain, neck stiffness and tinnitus.   Respiratory: Denies SOB, DOE, cough, chest tightness,  and wheezing.   Cardiovascular: Denies chest pain, palpitations and leg swelling.  Gastrointestinal: Denies nausea, vomiting, abdominal pain, diarrhea, constipation, blood in stool and abdominal distention.  Genitourinary: Denies dysuria, urgency, frequency, hematuria, flank pain and difficulty urinating.  Endocrine: Denies: hot or cold intolerance, sweats, changes in hair or nails, polyuria, polydipsia. Musculoskeletal: Denies myalgias, back pain, joint swelling, arthralgias and gait problem.  Skin: Denies pallor, rash and wound.  Neurological: Denies dizziness, seizures, syncope, weakness, light-headedness, numbness and headaches.  Hematological: Denies adenopathy. Easy bruising, personal or family bleeding history  Psychiatric/Behavioral: Denies suicidal  ideation, mood changes, confusion, nervousness, sleep disturbance and agitation    Physical Exam: Vitals:   12/20/21 1057  BP: 132/70  Pulse: 80  Temp: 97.6 F (36.4 C)  TempSrc: Oral  SpO2: 98%  Weight: 97 lb 9.6 oz (44.3 kg)  Height: 4' 8.5" (1.435 m)    Body mass index is 21.5 kg/m.   Constitutional: NAD, calm, comfortable Eyes: PERRL, lids and conjunctivae normal ENMT: Mucous membranes are moist.  Psychiatric: Normal judgment and insight. Alert and oriented x 3. Normal  mood.    Impression and Plan:  Sore throat  - Plan: POC COVID-19 BinaxNow, POCT rapid strep A -In office COVID and strep tests have been negative, still early in the course of her illness where this is possibly a false negative.  She has been advised to retest in 1 to 2 days.  Age-related osteoporosis without current pathological fracture -After discussing risk and benefit we have decided to resume treatment with Prolia as she had been on it previously but stopped about 2 years ago.  It has been sent to the infusion clinic on [address].    Time spent:23 minutes reviewing chart, interviewing and examining patient and formulating plan of care.     Lelon Frohlich, MD [facility] at [facility]

## 2021-12-24 ENCOUNTER — Encounter (HOSPITAL_COMMUNITY): Payer: Self-pay

## 2021-12-24 ENCOUNTER — Ambulatory Visit (INDEPENDENT_AMBULATORY_CARE_PROVIDER_SITE_OTHER): Payer: Medicare PPO

## 2021-12-24 ENCOUNTER — Ambulatory Visit (HOSPITAL_COMMUNITY)
Admission: EM | Admit: 2021-12-24 | Discharge: 2021-12-24 | Disposition: A | Discharge status: Home / Self Care | Payer: Medicare PPO | Attending: Internal Medicine | Admitting: Internal Medicine

## 2021-12-24 DIAGNOSIS — R0989 Other specified symptoms and signs involving the circulatory and respiratory systems: Secondary | ICD-10-CM | POA: Diagnosis not present

## 2021-12-24 DIAGNOSIS — B370 Candidal stomatitis: Secondary | ICD-10-CM

## 2021-12-24 DIAGNOSIS — R070 Pain in throat: Secondary | ICD-10-CM

## 2021-12-24 MED ORDER — CETIRIZINE HCL 5 MG PO TABS: 5.0000 mg | ORAL_TABLET | Freq: Every day | ORAL | 1 refills | Status: DC

## 2021-12-24 MED ORDER — NYSTATIN 100000 UNIT/ML MT SUSP
500000.0000 [IU] | Freq: Four times a day (QID) | OROMUCOSAL | 0 refills | Status: AC
Start: 2021-12-24 — End: 2021-12-29

## 2021-12-24 NOTE — Discharge Instructions (Addendum)
You are seen at urgent care for your throat pain and oral candidiasis.  Use nystatin mouthwash 4 times daily for the next 5 days.  Swish and spit this mouthwash to coat your tongue completely prior to spitting mouthwash out.  Do not wear your dentures until your oral candidiasis infection has resolved and after 5 days of treatment with nystatin mouthwash.  You may take Tylenol as needed for throat pain.  Take 5 mg of cetirizine daily to dry up postnasal drainage secretions causing your throat pain.    If you develop any new or worsening symptoms or do not improve in the next 2 to 3 days, please return.  If your symptoms are severe, please go to the emergency room.  Follow-up with your primary care provider for further evaluation and management of your symptoms as well as ongoing wellness visits.  I hope you feel better!

## 2021-12-24 NOTE — ED Notes (Signed)
Pt st she was ready to head out and to just let the provider know to call with the results.

## 2021-12-24 NOTE — ED Triage Notes (Signed)
Patient presents to Urgent Care with complaints of sore throat, L sided otalgia, and white tongue since Wednesday. Patient reports no otc medications.

## 2021-12-24 NOTE — ED Provider Notes (Signed)
[place]    CSN: [id number] Arrival date & time: 12/24/21  1137      History   Chief Complaint Chief Complaint  Patient presents with   Sore Throat   Otalgia   tounge white    HPI April [name] is a 87 y.o. female.   Patient presents to urgent care for evaluation of 4-day history of sore throat and nasal drainage.  She was seen by her primary care provider on Dec 20, 2021 (4 days ago) and tested for strep throat and COVID-19.  Strep throat and COVID-19 testing was negative at PCP.  She denies history of asthma and allergic rhinitis. Patient denies rash, shortness of breath, chest pain, cough, nausea, vomiting, fever, chills, urinary symptoms, headache, confusion, and dizziness.  She is reporting her tongue became white couple of days ago.  She does not use an albuterol inhaler, but does wear upper dentures.  She states she thoroughly cleans her dentures and does not have frequent oral candidiasis. She does not have abdominal pain and denies esophageal acid reflux. She has chronic back pain for which she takes tramadol once daily. She is not a smoker, but used to be 60 years ago. No other aggravating or relieving factors identified at this time.    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 coli UTI (urinary tract infection) 10/17/2020   Dysuria 10/13/2020   Chronic diarrhea 10/13/2020   Chronic right-sided low back pain with right-sided sciatica 09/27/2020   Vitamin D deficiency 03/09/2020   Transaminitis 03/09/2020    Bladder distension 10/13/2019   Weight loss, abnormal 99/83/3825   Acute metabolic encephalopathy 05/39/7673   Other constipation 06/18/2019   Physical debility 06/18/2019   Abdominal distension 06/08/2019   Severe pain 06/08/2019   Intractable back pain 06/08/2019   Closed wedge compression fracture of T8 vertebra ([place]) 06/07/2019   Sinus tachycardia 06/07/2019   Intractable pain 06/07/2019   Goals of care, counseling/discussion 05/14/2019   Abnormal liver enzymes 05/14/2019   Dehydration 05/14/2019   Failure to thrive in adult 05/14/2019   Abdominal pain 04/16/2019   Cancer associated pain 04/16/2019   History of breast cancer 06/19/2018   Skin lesion 05/06/2017   Essential hypertension 41/93/7902   Systolic hypertension, isolated 11/16/2016   Central retinal vein occlusion 10/18/2016   Preventive measure 04/28/2015   Thrombocytopenia ([place]) 04/28/2015   Idiopathic scoliosis 10/23/2012   CLL (chronic lymphocytic leukemia) ([place]) 03/02/2009   VENOUS INSUFFICIENCY, CHRONIC 10/28/2008   Dyslipidemia 10/27/2007   Osteoporosis 10/27/2007   SKIN CANCER, HX OF 10/27/2007   DIVERTICULITIS, HX OF 04/08/2007    Past Surgical History:  Procedure Laterality Date   ABDOMINAL HYSTERECTOMY  1980   BREAST IMPLANTS REMOVED  2001   BREAST SURGERY     Bilateral mastectomy   IR RADIOLOGIST EVAL & MGMT  07/01/2019   MASTECTOMY  1982   BILATERAL   OOPHORECTOMY     BSO   PARTIAL COLECTOMY  INTESTINAL ABSCESS WITH SALPINGECTOMY   RECONSTRUCTION LEFT ELBOW     TONSILLECTOMY     UMBILLICAL HERNIA REPAIR      OB History     Gravida  0   Para  0   Term      Preterm      AB      Living         SAB      IAB      Ectopic      Multiple      Live Births               Home Medications    Prior to Admission medications   Medication Sig Start Date End Date Taking? Authorizing Provider  cetirizine (ZYRTEC) 5 MG tablet Take 1 tablet (5 mg total) by mouth daily.  12/24/21  Yes Talbot Grumbling, FNP  nystatin (MYCOSTATIN) 100000 UNIT/ML suspension Take 5 mLs (500,000 Units total) by mouth 4 (four) times daily for 5 days. 12/24/21 12/29/21 Yes StanhopeStasia Cavalier, FNP  loperamide (IMODIUM) 2 MG capsule Take 1 capsule (2 mg total) by mouth 4 (four) times daily as needed for diarrhea or loose stools. 08/31/20   Luna Fuse, MD  traMADol (ULTRAM) 50 MG tablet Take 1 tablet (50 mg total) by mouth every 12 (twelve) hours as needed. 11/06/21   Aundra Dubin, PA-C  Vitamin D, Ergocalciferol, (DRISDOL) 1.25 MG (50000 UNIT) CAPS capsule Take 1 capsule (50,000 Units total) by mouth every 7 (seven) days for 12 doses. 12/13/21 03/01/22  Erline Hau, MD    Family History Family History  Problem Relation Age of Onset   Cancer Brother        HODGKINS   Cancer Brother        leukemia   Parkinson's disease Brother    Heart disease Mother    Heart disease Sister     Social History Social History   Tobacco Use   Smoking status: Former    Types: Cigarettes    Quit date: 03/26/1955    Years since quitting: 66.7   Smokeless tobacco: Never  Vaping Use   Vaping Use: Never used  Substance Use Topics   Alcohol use: Not Currently   Drug use: Never     Allergies   Codeine, Doxycycline, and Levofloxacin   Review of Systems Review of Systems  HENT:  Positive for ear pain.   Per HPI  Physical Exam Triage Vital Signs ED Triage Vitals  Enc Vitals Group     BP 12/24/21 1215 (!) 147/84     Pulse Rate 12/24/21 1215 82     Resp 12/24/21 1215 18     Temp 12/24/21 1215 98.4 F (36.9 C)     Temp Source 12/24/21 1215 Oral     SpO2 12/24/21 1215 97 %     Weight --      Height --      Head Circumference --      Peak Flow --      Pain Score 12/24/21 1214 3     Pain Loc --      Pain Edu? --      Excl. in [place]? --    No data found.  Updated Vital Signs BP (!) 147/84   Pulse 82   Temp 98.4 F (36.9 C) (Oral)   Resp 18   LMP 03/26/1979    SpO2 97%   Visual Acuity Right Eye Distance:   Left  Eye Distance:   Bilateral Distance:    Right Eye Near:   Left Eye Near:    Bilateral Near:     Physical Exam Vitals and nursing note reviewed.  Constitutional:      General: She is not in acute distress.    Appearance: Normal appearance. She is well-developed and normal weight. She is not ill-appearing.  HENT:     Head: Normocephalic and atraumatic.     Right Ear: Tympanic membrane, ear canal and external ear normal. No tenderness. No middle ear effusion. Tympanic membrane is not erythematous.     Left Ear: Tympanic membrane, ear canal and external ear normal. No tenderness.  No middle ear effusion. Tympanic membrane is not erythematous.     Nose: Nose normal. No congestion or rhinorrhea.     Mouth/Throat:     Mouth: Mucous membranes are moist.     Pharynx: Uvula midline. No posterior oropharyngeal erythema.     Comments: Small amount of post-nasal drainage visualized to physical exam that is clear.  Eyes:     Extraocular Movements: Extraocular movements intact.     Conjunctiva/sclera: Conjunctivae normal.  Cardiovascular:     Rate and Rhythm: Normal rate and regular rhythm.     Heart sounds: Normal heart sounds. No murmur heard.   No friction rub. No gallop.  Pulmonary:     Effort: Pulmonary effort is normal. No respiratory distress.     Breath sounds: Rales present. No wheezing or rhonchi.  Chest:     Chest wall: No tenderness.  Abdominal:     Palpations: Abdomen is soft.     Tenderness: There is no abdominal tenderness. There is no right CVA tenderness or left CVA tenderness.  Musculoskeletal:        General: No swelling.     Cervical back: Normal range of motion and neck supple.  Lymphadenopathy:     Cervical: No cervical adenopathy.  Skin:    General: Skin is warm and dry.     Capillary Refill: Capillary refill takes less than 2 seconds.     Findings: No rash.  Neurological:     General: No focal deficit  present.     Mental Status: She is alert and oriented to person, place, and time.  Psychiatric:        Mood and Affect: Mood normal.        Behavior: Behavior normal.        Thought Content: Thought content normal.        Judgment: Judgment normal.     UC Treatments / Results  Labs (all labs ordered are listed, but only abnormal results are displayed) Labs Reviewed - No data to display  EKG   Radiology DG Chest 2 View  Result Date: 12/24/2021 CLINICAL DATA:  Rales to physical exam. EXAM: CHEST - 2 VIEW COMPARISON:  AP chest 07/08/2019, chest two views 06/07/2019, and chest two views 12/31/2018; CT chest 06/08/2019 FINDINGS: Cardiac silhouette and mediastinal contours are within normal limits. Moderate to high-grade midthoracic spine dextrocurvature with compensatory levocurvature of the lower thoracic spine is similar to prior. The lungs are clear. No pleural effusion or pneumothorax. Moderate anterior height loss of the T11 vertebral body is unchanged from 06/08/2019 CT. Mild anterior height loss of the T12 vertebral body and mild-to-moderate height loss of the T8 vertebral body is also grossly unchanged. IMPRESSION: No active cardiopulmonary disease. Moderate to high-grade midthoracic spine dextrocurvature. Electronically Signed   By: Yvonne Kendall M.D.   On:  12/24/2021 13:42    Procedures Procedures (including critical care time)  Medications Ordered in UC Medications - No data to display  Initial Impression / Assessment and Plan / UC Course  I have reviewed the triage vital signs and the nursing notes.  Pertinent labs & imaging results that were available during my care of the patient were reviewed by me and considered in my medical decision making (see chart for details).   Patient is an 87 year old female presenting to urgent care with sore throat that has persisted for the last 4 days.  She has not taken any medications over-the-counter for her throat pain.  She was seen by  her PCP 4 days ago with negative COVID and strep testing.  She is also complaining of ear pain, but her tympanic membranes and ear canals appear normal bilaterally. Ear pain is likely related to swollen and irritated eustachian tubes due to throat inflammation.  Clear postnasal drainage visualized to posterior oropharynx exam.  Plan to treat sore throat with cetirizine once daily 5 mg to dry up secretions that are causing throat pain.   Tongue appears to have a white film indicated of oral candidiasis.  Plan to treat with nystatin mouthwash to be used 4 times daily for the next 5 days to treat thrush.  Patient instructed to avoid wearing her dentures until thrush infection fully resolves.  Rales heard to right lower lower lung field.  Patient has a history of chronic bronchitis.  She is not a smoker at this time and has not been for 60 years.  Chest x-ray obtained today to rule out pleural effusion, pneumonia, and other intrathoracic abnormality.  Patient does not have a cough, is not febrile at home or in clinic, does not have shortness of breath, and is report any pleuritic chest pain.  Chest x-ray shows no active cardiopulmonary disease.   Counseled patient regarding appropriate use of medications and potential side effects for all medications recommended or prescribed today. Discussed red flag signs and symptoms of worsening condition,when to call the PCP office, return to urgent care, and when to seek higher level of care.   Patient left urgent care prior to receiving results of x-ray and AVS and instructed nursing staff to have provider call her with results. She has mychart . Prescriptions sent to pharmacy. She will be able to see AVS on mychart.   Final Clinical Impressions(s) / UC Diagnoses   Final diagnoses:  Oral candidiasis  Throat pain     Discharge Instructions      You are seen at urgent care for your throat pain and oral candidiasis.  Use nystatin mouthwash 4 times daily for the  next 5 days.  Swish and spit this mouthwash to coat your tongue completely prior to spitting mouthwash out.  Do not wear your dentures until your oral candidiasis infection has resolved and after 5 days of treatment with nystatin mouthwash.  You may take Tylenol as needed for throat pain.  Take 5 mg of cetirizine daily to dry up postnasal drainage secretions causing your throat pain.    If you develop any new or worsening symptoms or do not improve in the next 2 to 3 days, please return.  If your symptoms are severe, please go to the emergency room.  Follow-up with your primary care provider for further evaluation and management of your symptoms as well as ongoing wellness visits.  I hope you feel better!     ED Prescriptions  Medication Sig Dispense Auth. Provider   nystatin (MYCOSTATIN) 100000 UNIT/ML suspension Take 5 mLs (500,000 Units total) by mouth 4 (four) times daily for 5 days. 60 mL Joella Prince M, FNP   cetirizine (ZYRTEC) 5 MG tablet Take 1 tablet (5 mg total) by mouth daily. 30 tablet Talbot Grumbling, FNP      PDMP not reviewed this encounter.   Talbot Grumbling, [place] 12/25/21 2115

## 2022-01-01 ENCOUNTER — Telehealth: Payer: Self-pay

## 2022-01-01 NOTE — Telephone Encounter (Signed)
Volunteer check in call for palliative care, patient doing well

## 2022-01-10 DIAGNOSIS — C50911 Malignant neoplasm of unspecified site of right female breast: Secondary | ICD-10-CM | POA: Diagnosis not present

## 2022-01-10 DIAGNOSIS — C50912 Malignant neoplasm of unspecified site of left female breast: Secondary | ICD-10-CM | POA: Diagnosis not present

## 2022-01-11 ENCOUNTER — Telehealth: Payer: Self-pay | Admitting: Internal Medicine

## 2022-01-11 NOTE — Telephone Encounter (Signed)
Pt states she has been waiting over a week for the approval for the Prolia  Please send to  [id number] [place] [place] Phone:  [phone] Fax:  [phone]

## 2022-01-15 NOTE — Telephone Encounter (Signed)
Dr. Jerilee Hoh keeps putting these in for the infusion clinic. Is she wanting them done here or at the infusion clinic?

## 2022-01-26 ENCOUNTER — Telehealth: Payer: Self-pay | Admitting: Internal Medicine

## 2022-01-26 NOTE — Telephone Encounter (Signed)
[employer] Rep (Meg) called at 3:54 pm requesting a call back regarding Pt's prescription. She stated she has several questions she needs addressed.   Please call [phone]

## 2022-01-29 NOTE — Telephone Encounter (Signed)
Faxes received about Prolia.  Judson Roch has addressed this.

## 2022-01-31 DIAGNOSIS — N3000 Acute cystitis without hematuria: Secondary | ICD-10-CM | POA: Diagnosis not present

## 2022-02-02 ENCOUNTER — Encounter: Payer: Self-pay | Admitting: Internal Medicine

## 2022-02-08 ENCOUNTER — Telehealth: Payer: Self-pay | Admitting: Internal Medicine

## 2022-02-08 DIAGNOSIS — C9110 Chronic lymphocytic leukemia of B-cell type not having achieved remission: Secondary | ICD-10-CM

## 2022-02-08 NOTE — Telephone Encounter (Signed)
Pt was getting tramadol 50 mg from dr Erlinda Hong ortho and he will not longer give her that medication. Pt would like to know if dr Jerilee Hoh would prescribe that medication for her chronic back pain.  [id number] [place] [place] Phone:  [phone] Fax:  [phone]   Pt is aware md will not be back in office until 02-14-2022

## 2022-02-12 DIAGNOSIS — N3000 Acute cystitis without hematuria: Secondary | ICD-10-CM | POA: Diagnosis not present

## 2022-02-12 DIAGNOSIS — R8271 Bacteriuria: Secondary | ICD-10-CM | POA: Diagnosis not present

## 2022-02-13 ENCOUNTER — Ambulatory Visit: Payer: Medicare PPO

## 2022-02-15 MED ORDER — TRAMADOL HCL 50 MG PO TABS: 50.0000 mg | ORAL_TABLET | Freq: Two times a day (BID) | ORAL | 2 refills | Status: DC | PRN

## 2022-02-15 NOTE — Telephone Encounter (Signed)
Refill sent.

## 2022-02-23 DIAGNOSIS — G8929 Other chronic pain: Secondary | ICD-10-CM | POA: Diagnosis not present

## 2022-02-23 DIAGNOSIS — Z87440 Personal history of urinary (tract) infections: Secondary | ICD-10-CM | POA: Diagnosis not present

## 2022-02-23 DIAGNOSIS — M810 Age-related osteoporosis without current pathological fracture: Secondary | ICD-10-CM | POA: Diagnosis not present

## 2022-02-23 DIAGNOSIS — R030 Elevated blood-pressure reading, without diagnosis of hypertension: Secondary | ICD-10-CM | POA: Diagnosis not present

## 2022-02-23 DIAGNOSIS — Z79620 Long term (current) use of immunosuppressive biologic: Secondary | ICD-10-CM | POA: Diagnosis not present

## 2022-02-23 DIAGNOSIS — Z856 Personal history of leukemia: Secondary | ICD-10-CM | POA: Diagnosis not present

## 2022-02-23 DIAGNOSIS — Z8249 Family history of ischemic heart disease and other diseases of the circulatory system: Secondary | ICD-10-CM | POA: Diagnosis not present

## 2022-02-23 DIAGNOSIS — Z833 Family history of diabetes mellitus: Secondary | ICD-10-CM | POA: Diagnosis not present

## 2022-02-23 DIAGNOSIS — Z791 Long term (current) use of non-steroidal anti-inflammatories (NSAID): Secondary | ICD-10-CM | POA: Diagnosis not present

## 2022-02-26 DIAGNOSIS — H34812 Central retinal vein occlusion, left eye, with macular edema: Secondary | ICD-10-CM | POA: Diagnosis not present

## 2022-02-26 DIAGNOSIS — H35371 Puckering of macula, right eye: Secondary | ICD-10-CM | POA: Diagnosis not present

## 2022-02-26 DIAGNOSIS — H35033 Hypertensive retinopathy, bilateral: Secondary | ICD-10-CM | POA: Diagnosis not present

## 2022-02-26 DIAGNOSIS — Z961 Presence of intraocular lens: Secondary | ICD-10-CM | POA: Diagnosis not present

## 2022-03-08 ENCOUNTER — Ambulatory Visit (INDEPENDENT_AMBULATORY_CARE_PROVIDER_SITE_OTHER): Payer: Medicare PPO | Admitting: *Deleted

## 2022-03-08 DIAGNOSIS — M810 Age-related osteoporosis without current pathological fracture: Secondary | ICD-10-CM

## 2022-03-08 MED ORDER — DENOSUMAB 60 MG/ML ~~LOC~~ SOSY
60.0000 mg | PREFILLED_SYRINGE | Freq: Once | SUBCUTANEOUS | Status: AC
  Administered 2022-03-08: 60 mg via SUBCUTANEOUS

## 2022-03-08 NOTE — Progress Notes (Signed)
Per orders of Dorothyann Peng, NP, injection of Prolia given by Westley Hummer. Patient tolerated injection well.

## 2022-03-15 ENCOUNTER — Other Ambulatory Visit (INDEPENDENT_AMBULATORY_CARE_PROVIDER_SITE_OTHER): Payer: Medicare PPO

## 2022-03-15 DIAGNOSIS — E559 Vitamin D deficiency, unspecified: Secondary | ICD-10-CM | POA: Diagnosis not present

## 2022-03-15 LAB — VITAMIN D 25 HYDROXY (VIT D DEFICIENCY, FRACTURES): VITD: 60.7 ng/mL (ref 30.00–100.00)

## 2022-04-04 DIAGNOSIS — R8271 Bacteriuria: Secondary | ICD-10-CM | POA: Diagnosis not present

## 2022-04-04 DIAGNOSIS — N3000 Acute cystitis without hematuria: Secondary | ICD-10-CM | POA: Diagnosis not present

## 2022-04-26 ENCOUNTER — Ambulatory Visit (INDEPENDENT_AMBULATORY_CARE_PROVIDER_SITE_OTHER): Payer: Medicare PPO | Admitting: *Deleted

## 2022-04-26 DIAGNOSIS — Z23 Encounter for immunization: Secondary | ICD-10-CM | POA: Diagnosis not present

## 2022-04-30 ENCOUNTER — Emergency Department (HOSPITAL_COMMUNITY): Payer: Medicare PPO

## 2022-04-30 ENCOUNTER — Inpatient Hospital Stay (HOSPITAL_COMMUNITY)
Admission: EM | Admit: 2022-04-30 | Discharge: 2022-05-03 | DRG: 177 | Disposition: A | Discharge status: Home Health | Payer: Medicare PPO | Attending: Family Medicine | Admitting: Family Medicine

## 2022-04-30 ENCOUNTER — Encounter (HOSPITAL_COMMUNITY): Payer: Self-pay

## 2022-04-30 DIAGNOSIS — Z853 Personal history of malignant neoplasm of breast: Secondary | ICD-10-CM | POA: Diagnosis not present

## 2022-04-30 DIAGNOSIS — J1282 Pneumonia due to coronavirus disease 2019: Secondary | ICD-10-CM | POA: Diagnosis not present

## 2022-04-30 DIAGNOSIS — R079 Chest pain, unspecified: Secondary | ICD-10-CM | POA: Diagnosis not present

## 2022-04-30 DIAGNOSIS — E876 Hypokalemia: Secondary | ICD-10-CM

## 2022-04-30 DIAGNOSIS — U071 COVID-19: Secondary | ICD-10-CM | POA: Diagnosis not present

## 2022-04-30 DIAGNOSIS — Z8249 Family history of ischemic heart disease and other diseases of the circulatory system: Secondary | ICD-10-CM

## 2022-04-30 DIAGNOSIS — Z806 Family history of leukemia: Secondary | ICD-10-CM | POA: Diagnosis not present

## 2022-04-30 DIAGNOSIS — Z79899 Other long term (current) drug therapy: Secondary | ICD-10-CM | POA: Diagnosis not present

## 2022-04-30 DIAGNOSIS — R0602 Shortness of breath: Secondary | ICD-10-CM | POA: Diagnosis not present

## 2022-04-30 DIAGNOSIS — Z820 Family history of epilepsy and other diseases of the nervous system: Secondary | ICD-10-CM | POA: Diagnosis not present

## 2022-04-30 DIAGNOSIS — Z807 Family history of other malignant neoplasms of lymphoid, hematopoietic and related tissues: Secondary | ICD-10-CM

## 2022-04-30 DIAGNOSIS — D696 Thrombocytopenia, unspecified: Secondary | ICD-10-CM | POA: Diagnosis present

## 2022-04-30 DIAGNOSIS — Z681 Body mass index (BMI) 19 or less, adult: Secondary | ICD-10-CM | POA: Diagnosis not present

## 2022-04-30 DIAGNOSIS — C9110 Chronic lymphocytic leukemia of B-cell type not having achieved remission: Secondary | ICD-10-CM | POA: Diagnosis not present

## 2022-04-30 DIAGNOSIS — R059 Cough, unspecified: Secondary | ICD-10-CM | POA: Diagnosis not present

## 2022-04-30 DIAGNOSIS — M810 Age-related osteoporosis without current pathological fracture: Secondary | ICD-10-CM | POA: Diagnosis present

## 2022-04-30 DIAGNOSIS — J159 Unspecified bacterial pneumonia: Secondary | ICD-10-CM | POA: Diagnosis present

## 2022-04-30 DIAGNOSIS — J189 Pneumonia, unspecified organism: Principal | ICD-10-CM

## 2022-04-30 DIAGNOSIS — Z87891 Personal history of nicotine dependence: Secondary | ICD-10-CM | POA: Diagnosis not present

## 2022-04-30 DIAGNOSIS — R64 Cachexia: Secondary | ICD-10-CM | POA: Diagnosis not present

## 2022-04-30 DIAGNOSIS — D61818 Other pancytopenia: Secondary | ICD-10-CM | POA: Diagnosis not present

## 2022-04-30 DIAGNOSIS — G8929 Other chronic pain: Secondary | ICD-10-CM | POA: Diagnosis present

## 2022-04-30 LAB — BASIC METABOLIC PANEL
Anion gap: 5 (ref 5–15)
BUN: 14 mg/dL (ref 8–23)
CO2: 26 mmol/L (ref 22–32)
Calcium: 8.7 mg/dL — ABNORMAL LOW (ref 8.9–10.3)
Chloride: 105 mmol/L (ref 98–111)
Creatinine, Ser: 0.62 mg/dL (ref 0.44–1.00)
GFR, Estimated: >60 mL/min (ref >60)
Glucose, Bld: 108 mg/dL — ABNORMAL HIGH (ref 70–99)
Potassium: 4.6 mmol/L (ref 3.5–5.1)
Sodium: 136 mmol/L (ref 135–145)

## 2022-04-30 LAB — CBC WITH DIFFERENTIAL/PLATELET
Abs Immature Granulocytes: 0.04 10*3/uL (ref 0.00–0.07)
Basophils Absolute: 0.1 10*3/uL (ref 0.0–0.1)
Basophils Relative: 0 %
Eosinophils Absolute: 0 10*3/uL (ref 0.0–0.5)
Eosinophils Relative: 0 %
HCT: 42.3 % (ref 36.0–46.0)
Hemoglobin: 13.9 g/dL (ref 12.0–15.0)
Immature Granulocytes: 0 %
Lymphocytes Relative: 78 %
Lymphs Abs: 18.8 10*3/uL — ABNORMAL HIGH (ref 0.7–4.0)
MCH: 34.5 pg — ABNORMAL HIGH (ref 26.0–34.0)
MCHC: 32.9 g/dL (ref 30.0–36.0)
MCV: 105 fL — ABNORMAL HIGH (ref 80.0–100.0)
Monocytes Absolute: 1.5 10*3/uL — ABNORMAL HIGH (ref 0.1–1.0)
Monocytes Relative: 6 %
Neutro Abs: 3.9 10*3/uL (ref 1.7–7.7)
Neutrophils Relative %: 16 %
Platelets: 92 10*3/uL — ABNORMAL LOW (ref 150–400)
RBC: 4.03 MIL/uL (ref 3.87–5.11)
RDW: 13 % (ref 11.5–15.5)
WBC: 24.4 10*3/uL — ABNORMAL HIGH (ref 4.0–10.5)
nRBC: 0.2 % (ref 0.0–0.2)

## 2022-04-30 LAB — RESP PANEL BY RT-PCR (FLU A&B, COVID) ARPGX2
Influenza A by PCR: NEGATIVE
Influenza B by PCR: NEGATIVE
SARS Coronavirus 2 by RT PCR: POSITIVE — AB

## 2022-04-30 LAB — TROPONIN I (HIGH SENSITIVITY)
Troponin I (High Sensitivity): 5 ng/L (ref <18)
Troponin I (High Sensitivity): 4 ng/L (ref <18)

## 2022-04-30 MED ORDER — SODIUM CHLORIDE 0.9 % IV SOLN
1.0000 g | Freq: Once | INTRAVENOUS | Status: AC
  Administered 2022-04-30: 1 g via INTRAVENOUS
  Filled 2022-04-30: qty 10

## 2022-04-30 MED ORDER — NIRMATRELVIR/RITONAVIR (PAXLOVID)TABLET
3.0000 | ORAL_TABLET | Freq: Two times a day (BID) | ORAL | Status: DC
  Administered 2022-04-30 – 2022-05-03 (×6): 3 via ORAL
  Filled 2022-04-30 (×2): qty 30

## 2022-04-30 MED ORDER — OXYCODONE HCL 5 MG PO TABS
5.0000 mg | ORAL_TABLET | ORAL | Status: DC | PRN
  Administered 2022-04-30 – 2022-05-02 (×4): 5 mg via ORAL
  Filled 2022-04-30 (×4): qty 1

## 2022-04-30 MED ORDER — ONDANSETRON HCL 4 MG PO TABS: 4.0000 mg | ORAL_TABLET | Freq: Four times a day (QID) | ORAL | Status: DC | PRN

## 2022-04-30 MED ORDER — ONDANSETRON HCL 4 MG/2ML IJ SOLN
4.0000 mg | Freq: Four times a day (QID) | INTRAMUSCULAR | Status: DC | PRN
  Administered 2022-05-01 (×2): 4 mg via INTRAVENOUS
  Filled 2022-04-30 (×3): qty 2

## 2022-04-30 MED ORDER — SODIUM CHLORIDE 0.9 % IV SOLN
500.0000 mg | Freq: Once | INTRAVENOUS | Status: AC
  Administered 2022-04-30: 500 mg via INTRAVENOUS
  Filled 2022-04-30: qty 5

## 2022-04-30 NOTE — ED Provider Notes (Signed)
[facility] DEPT Provider Note   CSN: [id number] Arrival date & time: 04/30/22  8676     History  Chief Complaint  Patient presents with   Chest Pain   Shortness of Breath    April [name] is a 87 y.o. female.   Chest Pain Associated symptoms: shortness of breath   Shortness of Breath Associated symptoms: chest pain      Patient with medical history of CLL not currently on chemo or radiation presents today due to chest pain and shortness of breath x 4 days.  Associated with nonproductive cough, denies any fevers at home.  The chest pain feels like a tightness, is worse when she coughs.  Does not radiate elsewhere, she feels short of breath she is worse at night.  Denies any lower extremity swelling, abdominal pain, nausea, vomiting.  She was on Macrobid 2 weeks ago for a UTI, that seems to have improved.  PMH of remote history of breast cancer, CLL, hypertension, thrombocytopenia, not currently undergoing chemo or radiation.  Home Medications Prior to Admission medications   Medication Sig Start Date End Date Taking? Authorizing Provider  cetirizine (ZYRTEC) 5 MG tablet Take 1 tablet (5 mg total) by mouth daily. 12/24/21   Talbot Grumbling, FNP  loperamide (IMODIUM) 2 MG capsule Take 1 capsule (2 mg total) by mouth 4 (four) times daily as needed for diarrhea or loose stools. 08/31/20   Luna Fuse, MD  traMADol (ULTRAM) 50 MG tablet Take 1 tablet (50 mg total) by mouth every 12 (twelve) hours as needed. 02/15/22   Isaac Bliss, Rayford Halsted, MD      Allergies    Codeine, Doxycycline, and Levofloxacin    Review of Systems   Review of Systems  Respiratory:  Positive for shortness of breath.   Cardiovascular:  Positive for chest pain.    Physical Exam Updated Vital Signs BP (!) 169/111 (BP Location: Left Arm)   Pulse 99   Temp 98.9 F (37.2 C) (Oral)   Resp 16   Ht 5' (1.524 m)   Wt 44.5 kg   LMP 03/26/1979   SpO2 100%   BMI  19.14 kg/m  Physical Exam Vitals and nursing note reviewed. Exam conducted with a chaperone present.  Constitutional:      Appearance: Normal appearance. She is ill-appearing.     Comments: Cachectic appearing  HENT:     Head: Normocephalic and atraumatic.  Eyes:     General: No scleral icterus.       Right eye: No discharge.        Left eye: No discharge.     Extraocular Movements: Extraocular movements intact.     Pupils: Pupils are equal, round, and reactive to light.  Cardiovascular:     Rate and Rhythm: Regular rhythm. Tachycardia present.     Pulses: Normal pulses.     Heart sounds: Normal heart sounds. No murmur heard.    No friction rub. No gallop.     Comments: No JVD, S1-S2 with regular rhythm. Pulmonary:     Effort: Pulmonary effort is normal. No respiratory distress.     Breath sounds: Decreased breath sounds present.  Abdominal:     General: Abdomen is flat. Bowel sounds are normal. There is no distension.     Palpations: Abdomen is soft.     Tenderness: There is no abdominal tenderness.  Musculoskeletal:     Right lower leg: No edema.     Left lower leg:  No edema.  Skin:    General: Skin is warm and dry.     Coloration: Skin is not jaundiced.  Neurological:     Mental Status: She is alert. Mental status is at baseline.     Coordination: Coordination normal.     ED Results / Procedures / Treatments   Labs (all labs ordered are listed, but only abnormal results are displayed) Labs Reviewed  RESP PANEL BY RT-PCR (FLU A&B, COVID) ARPGX2 - Abnormal; Notable for the following components:      Result Value   SARS Coronavirus 2 by RT PCR POSITIVE (*)    All other components within normal limits  BASIC METABOLIC PANEL - Abnormal; Notable for the following components:   Glucose, Bld 108 (*)    Calcium 8.7 (*)    All other components within normal limits  CBC WITH DIFFERENTIAL/PLATELET - Abnormal; Notable for the following components:   WBC 24.4 (*)    MCV  105.0 (*)    MCH 34.5 (*)    Platelets 92 (*)    Lymphs Abs 18.8 (*)    Monocytes Absolute 1.5 (*)    All other components within normal limits  TROPONIN I (HIGH SENSITIVITY)  TROPONIN I (HIGH SENSITIVITY)    EKG None  Radiology DG Chest 2 View  Result Date: 04/30/2022 CLINICAL DATA:  Cough and chest pain. EXAM: CHEST - 2 VIEW COMPARISON:  Chest radiograph 12/24/2021 and earlier FINDINGS: The heart size and mediastinal contours are within normal limits. Subtle patchy airspace opacities noted in the left mid lung. A small patchy airspace opacity is also noted in the right lung with adjacent subsegmental atelectasis versus scarring. No pleural effusion or pneumothorax. Diffuse osteopenia. Rightward curvature of the upper thoracic spine. Stable compression fracture of a midthoracic vertebral body and anterior wedging of distal thoracic vertebral body. IMPRESSION: Small bilateral patchy airspace opacities, concerning for multifocal pneumonia. Electronically Signed   By: Ileana Roup M.D.   On: 04/30/2022 10:07    Procedures Procedures    Medications Ordered in ED Medications  cefTRIAXone (ROCEPHIN) 1 g in sodium chloride 0.9 % 100 mL IVPB (1 g Intravenous New Bag/Given 04/30/22 1544)  azithromycin (ZITHROMAX) 500 mg in sodium chloride 0.9 % 250 mL IVPB (has no administration in time range)  nirmatrelvir/ritonavir EUA (PAXLOVID) 3 tablet (has no administration in time range)    ED Course/ Medical Decision Making/ A&P                           Medical Decision Making Amount and/or Complexity of Data Reviewed Labs: ordered. Radiology: ordered.  Risk Decision regarding hospitalization.   Patient presents due to chest tightness and shortness of breath.  Differential includes not limited to pneumonia, COVID, PE, ACS, malignancy, renal failure, is new onset of CHF.  I ordered, viewed and interpreted laboratory work-up. -CBC with leukocytosis 24.4 which is roughly baseline likely  secondary to CLL.  Thrombocytopenic at 92. -BMP without gross electrolyte derangement or AKI. -COVID-positive. -Negative troponin.  EKG shows sinus tachycardia without any specific ischemic changes.  I reviewed chest x-ray which is notable for multifocal pneumonia.  I considered sending the patient home, however given age, comorbidity and worsening shortness of breath and chest tightness I do think she would benefit from admission for IV antibiotics for pneumonia and COVID management.  I ordered Rocephin and azithromycin for the patient.  She is on cardiac monitoring and shows sinus tachycardia with a rate  of 99 per my interpretation.  I consulted with Dr. Marylyn Ishihara with hospitalist service, he request we start the patient on antivirals for COVID which I have ordered.  He accepts the patient for admission.        Final Clinical Impression(s) / ED Diagnoses Final diagnoses:  Multifocal pneumonia  COVID-19    Rx / DC Orders ED Discharge Orders     None         Sherrill Raring, Hershal Coria 04/30/22 1831    Carmin Muskrat, MD 05/01/22 2022

## 2022-04-30 NOTE — ED Provider Triage Note (Signed)
Emergency Medicine Provider Triage Evaluation Note  MARCI POLITO , a 86 y.o. female  was evaluated in triage.  Pt complains of chest congestion, onset Friday, felt feverish at that time, not since, also dry cough. No changes in bowel or bladder habits.  Home COVID negative Friday. Review of Systems  Positive: Chest congestion, SHOB, dry cough Negative:   Physical Exam  BP (!) 188/80   Pulse 86   Temp (!) 97.4 F (36.3 C) (Oral)   Resp 15   Ht 5' (1.524 m)   Wt 44.5 kg   LMP 03/26/1979   SpO2 95%   BMI 19.14 kg/m  Gen:   Awake, no distress   Resp:  Normal effort  MSK:   Moves extremities without difficulty  Other:    Medical Decision Making  Medically screening exam initiated at 10:13 AM.  Appropriate orders placed.  Lawerance Sabal was informed that the remainder of the evaluation will be completed by another provider, this initial triage assessment does not replace that evaluation, and the importance of remaining in the ED until their evaluation is complete.     Tacy Learn, PA-C 04/30/22 1014

## 2022-04-30 NOTE — ED Notes (Signed)
Rn called floor to get pt upstairs. Floor stated that they would get things working on enabling hand off

## 2022-04-30 NOTE — H&P (Signed)
History and Physical    Patient: April [name]:[id number] DOB: 03/01/1935 DOA: 04/30/2022 DOS: the patient was seen and examined on 04/30/2022 PCP: Isaac Bliss, Rayford Halsted, MD  Patient coming from: Home  Chief Complaint:  Chief Complaint  Patient presents with   Chest Pain   Shortness of Breath   HPI: April [name] is a 87 y.o. female with medical history significant of CLL, chronic pain. Presenting with chest pain and dyspnea. Her symptoms began three days ago w/ cough and congestion. She believes she had intermittent fevers, but can not confirm. She did not have any N/V/D or sick contacts. She did not try any home therapies. As her cough has worsened, she has developed chest pain around her ribs and sternum. It is non-radiating. When her symptoms did not improve, she decided to come to the ED for evaluation. She denies any other aggravating or alleviating factors.    Review of Systems: As mentioned in the history of present illness. All other systems reviewed and are negative. 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 Her smoking use included cigarettes. She has never used smokeless tobacco. She reports that she does not  currently use alcohol. She reports that she does not use drugs.  Allergies  Allergen Reactions   Codeine Nausea Only   Doxycycline Dermatitis   Levofloxacin Rash    Family History  Problem Relation Age of Onset   Cancer Brother        HODGKINS   Cancer Brother        leukemia   Parkinson's disease Brother    Heart disease Mother    Heart disease Sister     Prior to Admission medications   Medication Sig Start Date End Date Taking? Authorizing Provider  cetirizine (ZYRTEC) 5 MG tablet Take 1 tablet (5 mg total) by mouth daily. 12/24/21   Talbot Grumbling, FNP  loperamide (IMODIUM) 2 MG capsule Take 1 capsule (2 mg total) by mouth 4 (four) times daily as needed for diarrhea or loose stools. 08/31/20   Luna Fuse, MD  traMADol (ULTRAM) 50 MG tablet Take 1 tablet (50 mg total) by mouth every 12 (twelve) hours as needed. 02/15/22   Erline Hau, MD    Physical Exam: Vitals:   04/30/22 0942 04/30/22 1325  BP: (!) 188/80 (!) 169/111  Pulse: 86 99  Resp: 15 16  Temp: (!) 97.4 F (36.3 C) 98.9 F (37.2 C)  TempSrc: Oral Oral  SpO2: 95% 100%  Weight: 44.5 kg   Height: 5' (  1.524 m)    General: 87 y.o. female resting in bed in NAD Eyes: PERRL, normal sclera ENMT: Nares patent w/o discharge, orophaynx clear, dentition normal, ears w/o discharge/lesions/ulcers Neck: Supple, trachea midline Cardiovascular: RRR, +S1, S2, no m/g/r, equal pulses throughout Respiratory: course, slightly increased WOB, reproducible chest pain GI: BS+, NDNT, no masses noted, no organomegaly noted MSK: No e/c/c Neuro: A&O x 3, no focal deficits Psyc: Appropriate interaction and affect, calm/cooperative  Data Reviewed:  Results for orders placed or performed during the hospital encounter of 04/30/22 (from the past 24 hour(s))  Resp Panel by RT-PCR (Flu A&B, Covid) Anterior Nasal Swab     Status: Abnormal   Collection Time: 04/30/22 10:23 AM   Specimen: Anterior Nasal Swab  Result  Value Ref Range   SARS Coronavirus 2 by RT PCR POSITIVE (A) NEGATIVE   Influenza A by PCR NEGATIVE NEGATIVE   Influenza B by PCR NEGATIVE NEGATIVE  Basic metabolic panel     Status: Abnormal   Collection Time: 04/30/22 10:39 AM  Result Value Ref Range   Sodium 136 135 - 145 mmol/L   Potassium 4.6 3.5 - 5.1 mmol/L   Chloride 105 98 - 111 mmol/L   CO2 26 22 - 32 mmol/L   Glucose, Bld 108 (H) 70 - 99 mg/dL   BUN 14 8 - 23 mg/dL   Creatinine, Ser 0.62 0.44 - 1.00 mg/dL   Calcium 8.7 (L) 8.9 - 10.3 mg/dL   GFR, Estimated >60 >60 mL/min   Anion gap 5 5 - 15  Troponin I (High Sensitivity)     Status: None   Collection Time: 04/30/22 10:39 AM  Result Value Ref Range   Troponin I (High Sensitivity) 5 <18 ng/L  Troponin I (High Sensitivity)     Status: None   Collection Time: 04/30/22 12:44 PM  Result Value Ref Range   Troponin I (High Sensitivity) 4 <18 ng/L  CBC with Differential/Platelet     Status: Abnormal   Collection Time: 04/30/22 12:44 PM  Result Value Ref Range   WBC 24.4 (H) 4.0 - 10.5 K/uL   RBC 4.03 3.87 - 5.11 MIL/uL   Hemoglobin 13.9 12.0 - 15.0 g/dL   HCT 42.3 36.0 - 46.0 %   MCV 105.0 (H) 80.0 - 100.0 fL   MCH 34.5 (H) 26.0 - 34.0 pg   MCHC 32.9 30.0 - 36.0 g/dL   RDW 13.0 11.5 - 15.5 %   Platelets 92 (L) 150 - 400 K/uL   nRBC 0.2 0.0 - 0.2 %   Neutrophils Relative % 16 %   Neutro Abs 3.9 1.7 - 7.7 K/uL   Lymphocytes Relative 78 %   Lymphs Abs 18.8 (H) 0.7 - 4.0 K/uL   Monocytes Relative 6 %   Monocytes Absolute 1.5 (H) 0.1 - 1.0 K/uL   Eosinophils Relative 0 %   Eosinophils Absolute 0.0 0.0 - 0.5 K/uL   Basophils Relative 0 %   Basophils Absolute 0.1 0.0 - 0.1 K/uL   Immature Granulocytes 0 %   Abs Immature Granulocytes 0.04 0.00 - 0.07 K/uL   CXR: Small bilateral patchy airspace opacities, concerning for multifocal pneumonia.  Assessment and Plan: Multifocal PNA COVID 19 infection     - admit to inpt, med-surg     - continue rocephin, azithro; check  procal     - continue paxlovid, add inhalers, guaifenesin, IS/flutter     - follow inflammatory markers     - fluids  Pleuritic chest pain     -  pain control, anti-tussives  Chronic pain     - continue home regimen when confirmed  CLL     - continue outpt follow up  Advance Care Planning:   Code Status:Intubation only  Consults: None  Family Communication: w/ husband at bedside  Severity of Illness: The appropriate patient status for this patient is INPATIENT. Inpatient status is judged to be reasonable and necessary in order to provide the required intensity of service to ensure the patient's safety. The patient's presenting symptoms, physical exam findings, and initial radiographic and laboratory data in the context of their chronic comorbidities is felt to place them at high risk for further clinical deterioration. Furthermore, it is not anticipated that the patient will be medically stable for discharge from the hospital within 2 midnights of admission.   * I certify that at the point of admission it is my clinical judgment that the patient will require inpatient hospital care spanning beyond 2 midnights from the point of admission due to high intensity of service, high risk for further deterioration and high frequency of surveillance required.*  Author: Jonnie Finner, DO 04/30/2022 3:56 PM  For on call review [email or web address].

## 2022-05-01 ENCOUNTER — Inpatient Hospital Stay: Payer: Medicare PPO | Admitting: Hematology and Oncology

## 2022-05-01 ENCOUNTER — Inpatient Hospital Stay: Payer: Medicare PPO

## 2022-05-01 ENCOUNTER — Other Ambulatory Visit: Payer: Self-pay

## 2022-05-01 DIAGNOSIS — C9110 Chronic lymphocytic leukemia of B-cell type not having achieved remission: Secondary | ICD-10-CM

## 2022-05-01 DIAGNOSIS — D696 Thrombocytopenia, unspecified: Secondary | ICD-10-CM | POA: Diagnosis not present

## 2022-05-01 DIAGNOSIS — J189 Pneumonia, unspecified organism: Secondary | ICD-10-CM | POA: Diagnosis not present

## 2022-05-01 DIAGNOSIS — U071 COVID-19: Secondary | ICD-10-CM

## 2022-05-01 LAB — COMPREHENSIVE METABOLIC PANEL
ALT: 30 U/L (ref 0–44)
AST: 31 U/L (ref 15–41)
Albumin: 3.9 g/dL (ref 3.5–5.0)
Alkaline Phosphatase: 74 U/L (ref 38–126)
Anion gap: 12 (ref 5–15)
BUN: 12 mg/dL (ref 8–23)
CO2: 19 mmol/L — ABNORMAL LOW (ref 22–32)
Calcium: 8.6 mg/dL — ABNORMAL LOW (ref 8.9–10.3)
Chloride: 109 mmol/L (ref 98–111)
Creatinine, Ser: 0.64 mg/dL (ref 0.44–1.00)
GFR, Estimated: >60 mL/min (ref >60)
Glucose, Bld: 122 mg/dL — ABNORMAL HIGH (ref 70–99)
Potassium: 4 mmol/L (ref 3.5–5.1)
Sodium: 140 mmol/L (ref 135–145)
Total Bilirubin: 1.4 mg/dL — ABNORMAL HIGH (ref 0.3–1.2)
Total Protein: 7.1 g/dL (ref 6.5–8.1)

## 2022-05-01 LAB — CBC
HCT: 42.3 % (ref 36.0–46.0)
Hemoglobin: 13.7 g/dL (ref 12.0–15.0)
MCH: 34.4 pg — ABNORMAL HIGH (ref 26.0–34.0)
MCHC: 32.4 g/dL (ref 30.0–36.0)
MCV: 106.3 fL — ABNORMAL HIGH (ref 80.0–100.0)
Platelets: 86 K/uL — ABNORMAL LOW (ref 150–400)
RBC: 3.98 MIL/uL (ref 3.87–5.11)
RDW: 13.1 % (ref 11.5–15.5)
WBC: 26.8 K/uL — ABNORMAL HIGH (ref 4.0–10.5)
nRBC: 0 % (ref 0.0–0.2)

## 2022-05-01 LAB — CREATININE, SERUM
Creatinine, Ser: 0.62 mg/dL (ref 0.44–1.00)
GFR, Estimated: >60 mL/min (ref >60)

## 2022-05-01 LAB — D-DIMER, QUANTITATIVE: D-Dimer, Quant: 1.37 ug{FEU}/mL — ABNORMAL HIGH (ref 0.00–0.50)

## 2022-05-01 LAB — FERRITIN: Ferritin: 83 ng/mL (ref 11–307)

## 2022-05-01 LAB — C-REACTIVE PROTEIN: CRP: 3.2 mg/dL — ABNORMAL HIGH

## 2022-05-01 MED ORDER — SODIUM CHLORIDE 0.9 % IV SOLN: INTRAVENOUS | Status: AC

## 2022-05-01 MED ORDER — SODIUM CHLORIDE 0.9 % IV SOLN
1.0000 g | INTRAVENOUS | Status: DC
  Administered 2022-05-01 – 2022-05-02 (×2): 1 g via INTRAVENOUS
  Filled 2022-05-01 (×3): qty 10

## 2022-05-01 MED ORDER — ORAL CARE MOUTH RINSE: 15.0000 mL | OROMUCOSAL | Status: DC | PRN

## 2022-05-01 MED ORDER — ZINC SULFATE 220 (50 ZN) MG PO CAPS
220.0000 mg | ORAL_CAPSULE | Freq: Every day | ORAL | Status: DC
  Administered 2022-05-01 – 2022-05-03 (×3): 220 mg via ORAL
  Filled 2022-05-01 (×3): qty 1

## 2022-05-01 MED ORDER — AZITHROMYCIN 250 MG PO TABS
500.0000 mg | ORAL_TABLET | ORAL | Status: DC
  Administered 2022-05-01 – 2022-05-03 (×3): 500 mg via ORAL
  Filled 2022-05-01 (×3): qty 2

## 2022-05-01 MED ORDER — HYDROCOD POLI-CHLORPHE POLI ER 10-8 MG/5ML PO SUER: 5.0000 mL | Freq: Two times a day (BID) | ORAL | Status: DC | PRN

## 2022-05-01 MED ORDER — HYDRALAZINE HCL 20 MG/ML IJ SOLN: 10.0000 mg | Freq: Three times a day (TID) | INTRAMUSCULAR | Status: DC | PRN

## 2022-05-01 MED ORDER — GUAIFENESIN-DM 100-10 MG/5ML PO SYRP
10.0000 mL | ORAL_SOLUTION | ORAL | Status: DC | PRN
  Administered 2022-05-01 – 2022-05-02 (×2): 10 mL via ORAL
  Filled 2022-05-01 (×2): qty 10

## 2022-05-01 MED ORDER — IPRATROPIUM-ALBUTEROL 0.5-2.5 (3) MG/3ML IN SOLN
3.0000 mL | Freq: Four times a day (QID) | RESPIRATORY_TRACT | Status: DC | PRN
  Administered 2022-05-01: 3 mL via RESPIRATORY_TRACT
  Filled 2022-05-01: qty 3

## 2022-05-01 MED ORDER — VITAMIN C 500 MG PO TABS
500.0000 mg | ORAL_TABLET | Freq: Every day | ORAL | Status: DC
  Administered 2022-05-01 – 2022-05-03 (×3): 500 mg via ORAL
  Filled 2022-05-01 (×3): qty 1

## 2022-05-01 MED ORDER — IPRATROPIUM-ALBUTEROL 20-100 MCG/ACT IN AERS
1.0000 | INHALATION_SPRAY | Freq: Three times a day (TID) | RESPIRATORY_TRACT | Status: DC
  Administered 2022-05-02: 1 via RESPIRATORY_TRACT
  Filled 2022-05-01: qty 4

## 2022-05-01 MED ORDER — ENOXAPARIN SODIUM 30 MG/0.3ML IJ SOSY
30.0000 mg | PREFILLED_SYRINGE | INTRAMUSCULAR | Status: DC
  Administered 2022-05-01 – 2022-05-03 (×3): 30 mg via SUBCUTANEOUS
  Filled 2022-05-01 (×3): qty 0.3

## 2022-05-01 MED ORDER — IPRATROPIUM-ALBUTEROL 20-100 MCG/ACT IN AERS
1.0000 | INHALATION_SPRAY | Freq: Four times a day (QID) | RESPIRATORY_TRACT | Status: DC
  Administered 2022-05-01 (×4): 1 via RESPIRATORY_TRACT
  Filled 2022-05-01: qty 4

## 2022-05-01 MED ORDER — ACETAMINOPHEN 325 MG PO TABS
650.0000 mg | ORAL_TABLET | Freq: Four times a day (QID) | ORAL | Status: DC | PRN
  Administered 2022-05-01: 650 mg via ORAL
  Filled 2022-05-01: qty 2

## 2022-05-01 NOTE — Assessment & Plan Note (Signed)
Suspect superimposed bacterial pneumonia.  Weaned off O2 but still febrile, still not taking PO well - Continue Recophin and azithro - IS and flutter

## 2022-05-01 NOTE — Assessment & Plan Note (Addendum)
-   Continue Paxlovid, day 3 of 5

## 2022-05-01 NOTE — Evaluation (Signed)
Physical Therapy Evaluation-1x Patient Details Name: April [name] MRN: [id number] DOB: 14-Feb-1935 Today's Date: 05/01/2022  History of Present Illness  87 yo female admitted with chest pain, COVID. Hx of CLL, BrCa, compression fxs, osteoporosis, falls, scoliosis, back pain  Clinical Impression  On eval, pt was Mod Ind with mobility. She walked several laps around the room without a device. O2 97% on RA during session. Pt reports she has been mobilizing in her room without assistance. No acute PT needs. 1x eval. Will request for mobility team to follow pt during this hospital stay.        Recommendations for follow up therapy are one component of a multi-disciplinary discharge planning process, led by the attending physician.  Recommendations may be updated based on patient status, additional functional criteria and insurance authorization.  Follow Up Recommendations No PT follow up      Assistance Recommended at Discharge PRN  Patient can return home with the following       Equipment Recommendations None recommended by PT  Recommendations for Other Services       Functional Status Assessment Patient has had a recent decline in their functional status and demonstrates the ability to make significant improvements in function in a reasonable and predictable amount of time.     Precautions / Restrictions Precautions Precautions: Fall Restrictions Weight Bearing Restrictions: No      Mobility  Bed Mobility Overal bed mobility: Modified Independent                  Transfers Overall transfer level: Modified independent                      Ambulation/Gait Ambulation/Gait assistance: Modified independent (Device/Increase time) Gait Distance (Feet): 60 Feet   Gait Pattern/deviations: Step-through pattern       General Gait Details: Pt walked several laps around the room without a device. O2 97% on RA.  Stairs            Wheelchair Mobility     Modified Rankin (Stroke Patients Only)       Balance Overall balance assessment: Mild deficits observed, not formally tested                                           Pertinent Vitals/Pain Pain Assessment Pain Assessment: Faces Faces Pain Scale: Hurts little more Pain Location: chronic pain Pain Intervention(s): Monitored during session    Home Living Family/patient expects to be discharged to:: Private residence Living Arrangements: Spouse/significant other Available Help at Discharge: Family Type of Home: House Home Access: Stairs to enter   Technical brewer of Steps: 2   Home Layout: Bed/bath upstairs Home Equipment: Conservation officer, nature (2 wheels);Cane - single point;Shower seat - built in;Rollator (4 wheels);Grab bars - tub/shower      Prior Function Prior Level of Function : Independent/Modified Independent                     Hand Dominance        Extremity/Trunk Assessment   Upper Extremity Assessment Upper Extremity Assessment: Overall WFL for tasks assessed    Lower Extremity Assessment Lower Extremity Assessment: Generalized weakness    Cervical / Trunk Assessment Cervical / Trunk Assessment: Kyphotic  Communication   Communication: No difficulties  Cognition Arousal/Alertness: Awake/alert Behavior During Therapy: WFL for tasks assessed/performed Overall Cognitive  Status: Within Functional Limits for tasks assessed                                          General Comments      Exercises     Assessment/Plan    PT Assessment Patient does not need any further PT services  PT Problem List         PT Treatment Interventions      PT Goals (Current goals can be found in the Care Plan section)  Acute Rehab PT Goals Patient Stated Goal: to continue to feel better PT Goal Formulation: All assessment and education complete, DC therapy    Frequency       Co-evaluation                AM-PAC PT "6 Clicks" Mobility  Outcome Measure Help needed turning from your back to your side while in a flat bed without using bedrails?: None Help needed moving from lying on your back to sitting on the side of a flat bed without using bedrails?: None Help needed moving to and from a bed to a chair (including a wheelchair)?: None Help needed standing up from a chair using your arms (e.g., wheelchair or bedside chair)?: None Help needed to walk in hospital room?: None Help needed climbing 3-5 steps with a railing? : A Little 6 Click Score: 23    End of Session   Activity Tolerance: Patient tolerated treatment well Patient left: in bed;with call bell/phone within reach (sitting EOB)        Time: 6767-2094 PT Time Calculation (min) (ACUTE ONLY): 15 min   Charges:   PT Evaluation $PT Eval Low Complexity: [place], PT Acute Rehabilitation  Office: [phone] Pager: [phone]

## 2022-05-01 NOTE — Hospital Course (Signed)
April [name] is an 87 y.o. F with hx BrCA in remission, CLL, osteoporosis, chronic bronchitis who presented with cough, dyspnea, found to have multifocal PNA and COVID.   10/2: Admitted on antibiotics

## 2022-05-01 NOTE — Progress Notes (Signed)
  Progress Note   Patient: April [name] DOB: [birth year] DOA: 04/30/2022     1 DOS: the patient was seen and examined on 05/01/2022 at 12:08PM      Brief hospital course: April [name] is an 87 y.o. F with hx BrCA in remission, CLL, osteoporosis, chronic bronchitis who presented with cough, dyspnea, found to have multifocal PNA and COVID.   10/2: Admitted on antibiotics     Assessment and Plan: * Multifocal pneumonia Suspect superimposed bacterial pneumonia.  Still on O2 this morning. - Continue Recophin and azithro - IS and flutter   COVID-19 virus infection - Continue Paxlovid, day 2 of 5  Thrombocytopenia (HCC) Mild, no beleding  CLL (chronic lymphocytic leukemia) (HCC)            Subjective: Patient still very weak, had a rough night, barely able to breathe.  No fever, no confusion, no vomiting.     Physical Exam: BP (!) 142/78 (BP Location: Right Arm)   Pulse 86   Temp 98.8 F (37.1 C) (Oral)   Resp 17   Ht 5' (1.524 m)   Wt 44.5 kg   LMP 03/26/1979   SpO2 94%   BMI 19.14 kg/m   Thin elderly female, lying in bed, appears weak and tired RRR, no murmurs, no peripheral edema Respiratory rate shallow, crackles bilaterally, no wheezing Attention normal, affect appropriate, judgment and insight appear normal, generalized weakness, symmetric strength, face symmetric, speech fluent     Data Reviewed: Basic metabolic panel shows normal electrolytes and renal function D-dimer elevated CRP 3 Chest x-ray with bilateral opacities Troponins normal Thrombocytopenia noted on hemogram    Family Communication: Niece at the bedside    Disposition: Status is: Inpatient Patient is admitted with COVID and bilateral bacterial pneumonia with PSI 104, risk class IV, 8.2 to 9.3% mortality  We will continue IV antibiotics, if able to wean off of oxygen this evening, possibly home as early as tomorrow with oral  antibiotics        Author: Edwin Dada, MD 05/01/2022 2:06 PM  For on call review [email or web address].

## 2022-05-01 NOTE — Progress Notes (Signed)
  Transition of Care [facility]) Screening Note   Patient Details  Name: April [name] Date of Birth: 10/04/1934   Transition of Care [facility] CM/SW Contact:    Lennart Pall, LCSW Phone Number: 05/01/2022, 3:58 PM    Transition of Care Department [facility] has reviewed patient and no TOC needs have been identified at this time. We will continue to monitor patient advancement through interdisciplinary progression rounds. If new patient transition needs arise, please place a TOC consult.

## 2022-05-02 DIAGNOSIS — U071 COVID-19: Secondary | ICD-10-CM | POA: Diagnosis not present

## 2022-05-02 DIAGNOSIS — C9110 Chronic lymphocytic leukemia of B-cell type not having achieved remission: Secondary | ICD-10-CM | POA: Diagnosis not present

## 2022-05-02 DIAGNOSIS — D61818 Other pancytopenia: Secondary | ICD-10-CM

## 2022-05-02 DIAGNOSIS — D696 Thrombocytopenia, unspecified: Secondary | ICD-10-CM | POA: Diagnosis not present

## 2022-05-02 DIAGNOSIS — E876 Hypokalemia: Secondary | ICD-10-CM

## 2022-05-02 DIAGNOSIS — J189 Pneumonia, unspecified organism: Secondary | ICD-10-CM | POA: Diagnosis not present

## 2022-05-02 LAB — COMPREHENSIVE METABOLIC PANEL
ALT: 24 U/L (ref 0–44)
AST: 26 U/L (ref 15–41)
Albumin: 2.9 g/dL — ABNORMAL LOW (ref 3.5–5.0)
Alkaline Phosphatase: 63 U/L (ref 38–126)
Anion gap: 5 (ref 5–15)
BUN: 12 mg/dL (ref 8–23)
CO2: 23 mmol/L (ref 22–32)
Calcium: 7.6 mg/dL — ABNORMAL LOW (ref 8.9–10.3)
Chloride: 108 mmol/L (ref 98–111)
Creatinine, Ser: 0.57 mg/dL (ref 0.44–1.00)
GFR, Estimated: >60 mL/min (ref >60)
Glucose, Bld: 109 mg/dL — ABNORMAL HIGH (ref 70–99)
Potassium: 3.2 mmol/L — ABNORMAL LOW (ref 3.5–5.1)
Sodium: 136 mmol/L (ref 135–145)
Total Bilirubin: 1.6 mg/dL — ABNORMAL HIGH (ref 0.3–1.2)
Total Protein: 5.6 g/dL — ABNORMAL LOW (ref 6.5–8.1)

## 2022-05-02 LAB — CBC WITH DIFFERENTIAL/PLATELET
Abs Immature Granulocytes: 0.04 10*3/uL (ref 0.00–0.07)
Basophils Absolute: 0 10*3/uL (ref 0.0–0.1)
Basophils Relative: 0 %
Eosinophils Absolute: 0 10*3/uL (ref 0.0–0.5)
Eosinophils Relative: 0 %
HCT: 32.8 % — ABNORMAL LOW (ref 36.0–46.0)
Hemoglobin: 10.8 g/dL — ABNORMAL LOW (ref 12.0–15.0)
Immature Granulocytes: 0 %
Lymphocytes Relative: 70 %
Lymphs Abs: 9.1 10*3/uL — ABNORMAL HIGH (ref 0.7–4.0)
MCH: 34.4 pg — ABNORMAL HIGH (ref 26.0–34.0)
MCHC: 32.9 g/dL (ref 30.0–36.0)
MCV: 104.5 fL — ABNORMAL HIGH (ref 80.0–100.0)
Monocytes Absolute: 0.7 10*3/uL (ref 0.1–1.0)
Monocytes Relative: 5 %
Neutro Abs: 3.3 10*3/uL (ref 1.7–7.7)
Neutrophils Relative %: 25 %
Platelets: 65 10*3/uL — ABNORMAL LOW (ref 150–400)
RBC: 3.14 MIL/uL — ABNORMAL LOW (ref 3.87–5.11)
RDW: 13.2 % (ref 11.5–15.5)
WBC: 13.2 10*3/uL — ABNORMAL HIGH (ref 4.0–10.5)
nRBC: 0 % (ref 0.0–0.2)

## 2022-05-02 MED ORDER — POTASSIUM CHLORIDE CRYS ER 20 MEQ PO TBCR
40.0000 meq | EXTENDED_RELEASE_TABLET | Freq: Two times a day (BID) | ORAL | Status: AC
  Administered 2022-05-02 (×2): 40 meq via ORAL
  Filled 2022-05-02 (×2): qty 2

## 2022-05-02 MED ORDER — IPRATROPIUM-ALBUTEROL 20-100 MCG/ACT IN AERS: 1.0000 | INHALATION_SPRAY | Freq: Four times a day (QID) | RESPIRATORY_TRACT | Status: DC | PRN

## 2022-05-02 NOTE — Assessment & Plan Note (Signed)
Treated and resolved 

## 2022-05-02 NOTE — Progress Notes (Signed)
Mobility Specialist - Progress Note   05/02/22 1052  Mobility  Activity Ambulated with assistance in hallway  Activity Response Tolerated well  Distance Ambulated (ft) 350 ft  $Mobility charge 1 Mobility  Level of Assistance Modified independent, requires aide device or extra time  Assistive Device None  Mobility Referral Yes   Pt received in bed and agreed to mobility, no c/o pain nor discomfort during ambulation. Pt back to bed with all needs met and family in room.   Roderick Pee Mobility Specialist

## 2022-05-02 NOTE — Plan of Care (Signed)
  Problem: Activity: Goal: Risk for activity intolerance will decrease Outcome: Progressing   Problem: Pain Managment: Goal: General experience of comfort will improve Outcome: Progressing   Problem: Education: Goal: Knowledge of risk factors and measures for prevention of condition will improve Outcome: Progressing

## 2022-05-02 NOTE — Plan of Care (Signed)
  Problem: Health Behavior/Discharge Planning: Goal: Ability to manage health-related needs will improve Outcome: Progressing   Problem: Activity: Goal: Risk for activity intolerance will decrease Outcome: Progressing   Problem: Nutrition: Goal: Adequate nutrition will be maintained Outcome: Progressing   

## 2022-05-02 NOTE — Plan of Care (Signed)
  Problem: Health Behavior/Discharge Planning: Goal: Ability to manage health-related needs will improve Outcome: Progressing   Problem: Activity: Goal: Risk for activity intolerance will decrease Outcome: Progressing   Problem: Nutrition: Goal: Adequate nutrition will be maintained Outcome: Progressing   Problem: Pain Managment: Goal: General experience of comfort will improve Outcome: Progressing   

## 2022-05-02 NOTE — Assessment & Plan Note (Signed)
Due to pneumonia in the setting of lymphoma

## 2022-05-02 NOTE — Progress Notes (Signed)
  Progress Note   Patient: April [name] TDS:[id number] DOB: 02/24/1935 DOA: 04/30/2022     2 DOS: the patient was seen and examined on 05/02/2022 at 10:20AM      Brief hospital course: Mrs. Mcginness is an 87 y.o. F with hx BrCA in remission, CLL, osteoporosis, chronic bronchitis who presented with cough, dyspnea, found to have multifocal PNA and COVID.   10/2: Admitted on antibiotics 10/4: Still febrile    Assessment and Plan: * Multifocal pneumonia Suspect superimposed bacterial pneumonia.  Weaned off O2 but still febrile, still not taking PO well - Continue Recophin and azithro - IS and flutter   Hypokalemia - Supplement potassium  Pancytopenia (HCC) Due to pneumonia in the setting of lymphoma  COVID-19 virus infection - Continue Paxlovid, day 3 of 5  Thrombocytopenia (HCC)    CLL (chronic lymphocytic leukemia) (HCC)             Subjective: No vomiting, no confusion.  Poor oral intake, feels very weak and tired, still very sluggish.  Barely able to get up.  Fever last night.     Physical Exam: BP 133/66 (BP Location: Right Arm)   Pulse 76   Temp 98.4 F (36.9 C) (Oral)   Resp 17   Ht 5' (1.524 m)   Wt 44.5 kg   LMP 03/26/1979   SpO2 97%   BMI 19.14 kg/m   Thin elderly female, lying in bed, appears debilitated RRR, no murmurs, no peripheral edema Respiratory rate normal, lung sounds diminished, crackles at bilateral bases, no wheezing, lung sounds slightly better than yesterday Abdomen soft without tenderness palpation or guarding, no ascites or distention Attention normal, affect blunted, judgment and insight appear normal     Data Reviewed: Hemogram shows white blood cell count 13, platelets 65, hemoglobin 10 Basic metabolic panel shows potassium 3.2, albumin low, creatinine and sodium normal  Family Communication: None at the bedside    Disposition: Status is: Inpatient Pneumonia, she has PSI 104, she will require inpatient  treatment, she is still febrile today and very deconditioned but I suspect in another 1 or 2 days, she will be stable for discharge to home        Author: Edwin Dada, MD 05/02/2022 11:10 AM  For on call review [email or web address].

## 2022-05-03 DIAGNOSIS — J189 Pneumonia, unspecified organism: Secondary | ICD-10-CM | POA: Diagnosis not present

## 2022-05-03 LAB — CBC WITH DIFFERENTIAL/PLATELET
Abs Immature Granulocytes: 0.03 10*3/uL (ref 0.00–0.07)
Basophils Absolute: 0 10*3/uL (ref 0.0–0.1)
Basophils Relative: 0 %
Eosinophils Absolute: 0.1 10*3/uL (ref 0.0–0.5)
Eosinophils Relative: 1 %
HCT: 35.2 % — ABNORMAL LOW (ref 36.0–46.0)
Hemoglobin: 11.6 g/dL — ABNORMAL LOW (ref 12.0–15.0)
Immature Granulocytes: 0 %
Lymphocytes Relative: 73 %
Lymphs Abs: 10.8 10*3/uL — ABNORMAL HIGH (ref 0.7–4.0)
MCH: 34.5 pg — ABNORMAL HIGH (ref 26.0–34.0)
MCHC: 33 g/dL (ref 30.0–36.0)
MCV: 104.8 fL — ABNORMAL HIGH (ref 80.0–100.0)
Monocytes Absolute: 1.2 10*3/uL — ABNORMAL HIGH (ref 0.1–1.0)
Monocytes Relative: 8 %
Neutro Abs: 2.6 10*3/uL (ref 1.7–7.7)
Neutrophils Relative %: 18 %
Platelets: 81 10*3/uL — ABNORMAL LOW (ref 150–400)
RBC: 3.36 MIL/uL — ABNORMAL LOW (ref 3.87–5.11)
RDW: 13.2 % (ref 11.5–15.5)
WBC: 14.8 10*3/uL — ABNORMAL HIGH (ref 4.0–10.5)
nRBC: 0 % (ref 0.0–0.2)

## 2022-05-03 LAB — COMPREHENSIVE METABOLIC PANEL
ALT: 30 U/L (ref 0–44)
AST: 30 U/L (ref 15–41)
Albumin: 3.1 g/dL — ABNORMAL LOW (ref 3.5–5.0)
Alkaline Phosphatase: 76 U/L (ref 38–126)
Anion gap: 7 (ref 5–15)
BUN: 10 mg/dL (ref 8–23)
CO2: 26 mmol/L (ref 22–32)
Calcium: 8.4 mg/dL — ABNORMAL LOW (ref 8.9–10.3)
Chloride: 108 mmol/L (ref 98–111)
Creatinine, Ser: 0.58 mg/dL (ref 0.44–1.00)
GFR, Estimated: >60 mL/min (ref >60)
Glucose, Bld: 106 mg/dL — ABNORMAL HIGH (ref 70–99)
Potassium: 4.6 mmol/L (ref 3.5–5.1)
Sodium: 141 mmol/L (ref 135–145)
Total Bilirubin: 0.9 mg/dL (ref 0.3–1.2)
Total Protein: 6 g/dL — ABNORMAL LOW (ref 6.5–8.1)

## 2022-05-03 MED ORDER — CEFDINIR 300 MG PO CAPS: 300.0000 mg | ORAL_CAPSULE | Freq: Two times a day (BID) | ORAL | 0 refills | Status: DC

## 2022-05-03 MED ORDER — AZITHROMYCIN 250 MG PO TABS: 250.0000 mg | ORAL_TABLET | Freq: Every day | ORAL | 0 refills | Status: DC

## 2022-05-03 MED ORDER — NIRMATRELVIR/RITONAVIR (PAXLOVID)TABLET: 3.0000 | ORAL_TABLET | Freq: Two times a day (BID) | ORAL | 0 refills | Status: AC

## 2022-05-03 NOTE — Progress Notes (Addendum)
Discharge package printed and instructions given to patient. Patient verbalizes understanding. Paxlovid/ instructions given to pt.

## 2022-05-03 NOTE — Discharge Summary (Signed)
Physician Discharge Summary   Patient: April [name] MRN: [id number] DOB: 10/21/1934  Admit date:     04/30/2022  Discharge date: 05/03/22  Discharge Physician: Edwin Dada   PCP: Isaac Bliss, Rayford Halsted, MD     Recommendations at discharge:  Follow up with PCP in 1 week Follow up with Oncology in 1-2 weeks Dr. Alvy Bimler: Please check CBC in 1-2 weeks      Discharge Diagnoses: Principal Problem:   Multifocal pneumonia Active Problems:   CLL (chronic lymphocytic leukemia) ([place])   Thrombocytopenia ([place])   COVID-19 virus infection   Pancytopenia ([place])   Hypokalemia      Hospital Course: April [name] is an 87 y.o. F with hx BrCA in remission, CLL, osteoporosis, chronic bronchitis who presented with cough, dyspnea, found to have multifocal PNA and COVID.      * Multifocal pneumonia Suspect superimposed bacterial pneumonia.  Treated with Rocephin and azithomycin. Patient mentating at baseline, taking orals.  Temp < 100 F, heart rate < 100bpm, RR < 24, SpO2 at baseline.   Stable for discharge.  To complete cefdinir and azithromycin PO.        COVID-19 virus infection Finish Paxlovid, provided with package at discharge.               The [facility] Controlled Substances Registry was reviewed for this patient prior to discharge.  Consultants: None   Disposition: Home health Diet recommendation: Regular   DISCHARGE MEDICATION: Allergies as of 05/03/2022       Reactions   Codeine Nausea Only   Doxycycline Dermatitis   Levofloxacin Rash        Medication List     STOP taking these medications    cetirizine 5 MG tablet Commonly known as: ZYRTEC       TAKE these medications    azithromycin 250 MG tablet Commonly known as: ZITHROMAX Take 1 tablet (250 mg total) by mouth daily.   cefdinir 300 MG capsule Commonly known as: OMNICEF Take 1 capsule (300 mg total) by mouth 2 (two) times daily.   loperamide 2 MG  capsule Commonly known as: IMODIUM Take 1 capsule (2 mg total) by mouth 4 (four) times daily as needed for diarrhea or loose stools.   nirmatrelvir/ritonavir EUA 20 x 150 MG & 10 x 100MG Tabs Commonly known as: PAXLOVID Take 3 tablets by mouth 2 (two) times daily for 5 days. Take nirmatrelvir (150 mg) two tablets twice daily and ritonavir (100 mg) one tablet twice daily for 4 more doses.   traMADol 50 MG tablet Commonly known as: ULTRAM Take 1 tablet (50 mg total) by mouth every 12 (twelve) hours as needed. What changed: reasons to take this        Follow-up Information     Isaac Bliss, Rayford Halsted, MD. Schedule an appointment as soon as possible for a visit in 1 week(s).   Specialty: Internal Medicine Contact information: [place] [address] [phone]         Heath Lark, MD. Schedule an appointment as soon as possible for a visit in 1 week(s).   Specialty: Hematology and Oncology Contact information: [place] [address] [phone]                 Discharge Instructions     Discharge instructions   Complete by: As directed    From Dr. Loleta Books: You were admitted for COVID Here, you were treated with Paxlovid and antiviral  medicine to treat COVID You should finish the last four doses Starting tonight, take nirmatrelvir 2 tabs and ritonavir 1 tab, twice daily until the box is gone (4 more doses)  You also were treated for bacterial pneumonia Take cefdinir 300 mg twice daily starting tonight Take azithyromcyin 250 mg once daily starting tomorrow  Go see Dr. Jerilee Hoh in 1 week Go see your Oncologist in 1-2 weeks Have them check your blood work   Increase activity slowly   Complete by: As directed        Discharge Exam: Filed Weights   04/30/22 0942  Weight: 44.5 kg    General: Pt is alert, awake, not in acute distress Cardiovascular: RRR, nl S1-S2, no murmurs appreciated.   No LE  edema.   Respiratory: Kyphotic, lung sounds diminished, no wheezing, faint crackles intermittently at base  Abdominal: Abdomen soft and non-tender.  No distension or HSM.   Neuro/Psych: Strength symmetric in upper and lower extremities.  Judgment and insight appear normal.   Condition at discharge: good  The results of significant diagnostics from this hospitalization (including imaging, microbiology, ancillary and laboratory) are listed below for reference.   Imaging Studies: DG Chest 2 View  Result Date: 04/30/2022 CLINICAL DATA:  Cough and chest pain. EXAM: CHEST - 2 VIEW COMPARISON:  Chest radiograph 12/24/2021 and earlier FINDINGS: The heart size and mediastinal contours are within normal limits. Subtle patchy airspace opacities noted in the left mid lung. A small patchy airspace opacity is also noted in the right lung with adjacent subsegmental atelectasis versus scarring. No pleural effusion or pneumothorax. Diffuse osteopenia. Rightward curvature of the upper thoracic spine. Stable compression fracture of a midthoracic vertebral body and anterior wedging of distal thoracic vertebral body. IMPRESSION: Small bilateral patchy airspace opacities, concerning for multifocal pneumonia. Electronically Signed   By: Ileana Roup M.D.   On: 04/30/2022 10:07    Microbiology: Results for orders placed or performed during the hospital encounter of 04/30/22  Resp Panel by RT-PCR (Flu A&B, Covid) Anterior Nasal Swab     Status: Abnormal   Collection Time: 04/30/22 10:23 AM   Specimen: Anterior Nasal Swab  Result Value Ref Range Status   SARS Coronavirus 2 by RT PCR POSITIVE (A) NEGATIVE Final    Comment: (NOTE) SARS-CoV-2 target nucleic acids are DETECTED.  The SARS-CoV-2 RNA is generally detectable in upper respiratory specimens during the acute phase of infection. Positive results are indicative of the presence of the identified virus, but do not rule out bacterial infection or co-infection  with other pathogens not detected by the test. Clinical correlation with patient history and other diagnostic information is necessary to determine patient infection status. The expected result is Negative.  Fact Sheet for Patients: EntrepreneurPulse.com.au  Fact Sheet for Healthcare Providers: IncredibleEmployment.be  This test is not yet approved or cleared by the [place] FDA and  has been authorized for detection and/or diagnosis of SARS-CoV-2 by FDA under an Emergency Use Authorization (EUA).  This EUA will remain in effect (meaning this test can be used) for the duration of  the COVID-19 declaration under Section 564(b)(1) of the A ct, 21 U.S.C. section 360bbb-3(b)(1), unless the authorization is terminated or revoked sooner.     Influenza A by PCR NEGATIVE NEGATIVE Final   Influenza B by PCR NEGATIVE NEGATIVE Final    Comment: (NOTE) The Xpert Xpress SARS-CoV-2/FLU/RSV plus assay is intended as an aid in the diagnosis of influenza from Nasopharyngeal swab specimens and should not be used  as a sole basis for treatment. Nasal washings and aspirates are unacceptable for Xpert Xpress SARS-CoV-2/FLU/RSV testing.  Fact Sheet for Patients: EntrepreneurPulse.com.au  Fact Sheet for Healthcare Providers: IncredibleEmployment.be  This test is not yet approved or cleared by the [place] FDA and has been authorized for detection and/or diagnosis of SARS-CoV-2 by FDA under an Emergency Use Authorization (EUA). This EUA will remain in effect (meaning this test can be used) for the duration of the COVID-19 declaration under Section 564(b)(1) of the Act, 21 U.S.C. section 360bbb-3(b)(1), unless the authorization is terminated or revoked.  Performed at [facility], [place] [address] [place], [place] [address]     Labs: CBC: Recent Labs  Lab 04/30/22 1244 05/01/22 0106  05/02/22 0357 05/03/22 0351  WBC 24.4* 26.8* 13.2* 14.8*  NEUTROABS 3.9  --  3.3 2.6  HGB 13.9 13.7 10.8* 11.6*  HCT 42.3 42.3 32.8* 35.2*  MCV 105.0* 106.3* 104.5* 104.8*  PLT 92* 86* 65* 81*   Basic Metabolic Panel: Recent Labs  Lab 04/30/22 1039 05/01/22 0106 05/01/22 0107 05/02/22 0357 05/03/22 0351  NA 136  --  140 136 141  K 4.6  --  4.0 3.2* 4.6  CL 105  --  109 108 108  CO2 26  --  19* 23 26  GLUCOSE 108*  --  122* 109* 106*  BUN 14  --  '12 12 10  ' CREATININE 0.62 0.62 0.64 0.57 0.58  CALCIUM 8.7*  --  8.6* 7.6* 8.4*   Liver Function Tests: Recent Labs  Lab 05/01/22 0107 05/02/22 0357 05/03/22 0351  AST '31 26 30  ' ALT '30 24 30  ' ALKPHOS 74 63 76  BILITOT 1.4* 1.6* 0.9  PROT 7.1 5.6* 6.0*  ALBUMIN 3.9 2.9* 3.1*   CBG: No results for input(s): "GLUCAP" in the last 168 hours.  Discharge time spent: approximately 35 minutes spent on discharge counseling, evaluation of patient on day of discharge, and coordination of discharge planning with nursing, social work, pharmacy and case management  Signed: Edwin Dada, MD [facility] 05/03/2022

## 2022-05-03 NOTE — Plan of Care (Signed)
  Problem: Activity: Goal: Risk for activity intolerance will decrease Outcome: Progressing   Problem: Safety: Goal: Ability to remain free from injury will improve Outcome: Progressing   

## 2022-05-03 NOTE — TOC Transition Note (Signed)
Transition of Care [facility]) - CM/SW Discharge Note   Patient Details  Name: April [name] MRN: [id number] Date of Birth: 03/28/1935  Transition of Care [facility]) CM/SW Contact:  Lennart Pall, LCSW Phone Number: 05/03/2022, 12:36 PM   Clinical Narrative:     Spoke with pt who is aware orders placed for HHPT/OT.  She is agreeable and no agency preference - referral placed with [facility] and info placed on AVS.  Final next level of care: Home w [place] Barriers to Discharge: No Barriers Identified   Patient Goals and CMS Choice Patient states their goals for this hospitalization and ongoing recovery are:: return home      Discharge Placement                       Discharge Plan and Services                DME Arranged: N/A DME Agency: NA       HH Arranged: PT, OT HH Agency: [place] Date Cook: 05/03/22 Time [place]: 1232 Representative spoke with at [place]: Adela Lank  Social Determinants of Health (SDOH) Interventions     Readmission Risk Interventions    05/01/2022    3:59 PM  Readmission Risk Prevention Plan  Post Dischage Appt Complete  Medication Screening Complete  Transportation Screening Complete

## 2022-05-04 ENCOUNTER — Telehealth: Payer: Self-pay

## 2022-05-04 NOTE — Telephone Encounter (Signed)
Transition Care Management Unsuccessful Follow-up Telephone Call  Date of discharge and from where:  05/03/2022 April [name]  Attempts:  1st Attempt  Reason for unsuccessful TCM follow-up call:  Unable to leave message

## 2022-05-14 ENCOUNTER — Other Ambulatory Visit: Payer: Self-pay | Admitting: Internal Medicine

## 2022-05-14 ENCOUNTER — Ambulatory Visit: Payer: Medicare PPO | Admitting: Internal Medicine

## 2022-05-14 ENCOUNTER — Encounter: Payer: Self-pay | Admitting: Internal Medicine

## 2022-05-14 VITALS — BP 110/78 | HR 90 | Temp 97.5°F | Wt 98.7 lb

## 2022-05-14 DIAGNOSIS — N3000 Acute cystitis without hematuria: Secondary | ICD-10-CM | POA: Diagnosis not present

## 2022-05-14 DIAGNOSIS — U071 COVID-19: Secondary | ICD-10-CM | POA: Diagnosis not present

## 2022-05-14 DIAGNOSIS — Z09 Encounter for follow-up examination after completed treatment for conditions other than malignant neoplasm: Secondary | ICD-10-CM

## 2022-05-14 DIAGNOSIS — J189 Pneumonia, unspecified organism: Secondary | ICD-10-CM | POA: Diagnosis not present

## 2022-05-14 DIAGNOSIS — C9110 Chronic lymphocytic leukemia of B-cell type not having achieved remission: Secondary | ICD-10-CM

## 2022-05-14 NOTE — Progress Notes (Signed)
Established Patient Office Visit     CC/Reason for Visit: Hospital follow-up  HPI: April [name] is a 87 y.o. female who is coming in today for the above mentioned reasons. Past Medical History is significant for: History of breast cancer, CLL, osteoporosis.  She was admitted to the hospital October 2 and was kept through October 5 due to cough and dyspnea found to have multifocal pneumonia and COVID-19 infection.  She has completed antibiotics and antivirals and feels close to baseline.  She does have some sore throat and residual cough.   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 Her smoking use included cigarettes. She has never used smokeless tobacco. She reports that she does not currently use alcohol. She reports that she does not use drugs.  Allergies: Allergies  Allergen Reactions   Codeine Nausea Only   Doxycycline Dermatitis   Levofloxacin Rash    Family History:  Family History  Problem Relation Age of Onset   Cancer Brother        HODGKINS   Cancer Brother        leukemia   Parkinson's disease Brother    Heart disease Mother    Heart disease Sister       Current Outpatient Medications:    loperamide (IMODIUM) 2 MG capsule, Take 1 capsule (2 mg total) by mouth 4 (four) times daily as needed for diarrhea or loose stools., Disp: 8 capsule, Rfl: 0   traMADol (ULTRAM) 50 MG tablet, TAKE 1 TABLET BY MOUTH EVERY 12 HOURS AS NEEDED., Disp: 30 tablet, Rfl: 2  Review of Systems:  Constitutional: Denies fever, chills, diaphoresis. HEENT: Denies photophobia, eye pain, redness, , mouth sores, trouble swallowing, neck pain, neck stiffness and tinnitus.   Respiratory: Denies SOB, DOE,  chest tightness,  and wheezing.   Cardiovascular: Denies chest pain, palpitations and leg swelling.  Gastrointestinal: Denies nausea, vomiting, abdominal pain, diarrhea, constipation, blood in stool and abdominal distention.  Genitourinary: Denies dysuria, urgency, frequency, hematuria, flank pain and difficulty urinating.  Endocrine: Denies: hot or cold intolerance, sweats, changes in hair or nails, polyuria, polydipsia. Musculoskeletal: Denies myalgias, back pain, joint swelling, arthralgias and gait problem.  Skin: Denies pallor, rash and wound.  Neurological: Denies dizziness, seizures, syncope, weakness, light-headedness, numbness and headaches.  Hematological: Denies adenopathy. Easy bruising, personal or family bleeding history  Psychiatric/Behavioral: Denies suicidal ideation, mood changes, confusion, nervousness, sleep disturbance and agitation    Physical Exam: Vitals:   05/14/22 1527  BP: 110/78  Pulse: 90  Temp: (!) 97.5 F (36.4 C)  TempSrc: Oral  SpO2: 97%  Weight: 98 lb 11.2 oz (44.8 kg)    Body mass index is 19.28 kg/m.   Constitutional: NAD, calm, comfortable Eyes: PERRL, lids and conjunctivae normal ENMT: Mucous membranes are moist.  Respiratory: clear to auscultation bilaterally, no wheezing, no crackles. Normal respiratory effort. No accessory muscle use.  Cardiovascular: Regular rate and rhythm, no murmurs / rubs / gallops. No  extremity edema.  Psychiatric: Normal judgment and insight. Alert and oriented x 3. Normal mood.    Impression and Plan:  Hospital discharge follow-up  COVID-19 virus infection  Multifocal pneumonia  -Hospital charts have been reviewed in great detail. -She has completed course of Paxlovid, azithromycin and cefdinir that was prescribed on discharge.  She continues to have some mild sore throat and postnasal discharge but is otherwise doing well. -She continues to follow with her urologist in regards to her recurrent UTIs.  She feels like she might have another one at present.  She already dropped off a urine sample with them.  Time spent:30 minutes reviewing chart, interviewing and examining patient and formulating plan of care.       Lelon Frohlich, MD [facility]

## 2022-05-19 ENCOUNTER — Ambulatory Visit (HOSPITAL_COMMUNITY)
Admission: EM | Admit: 2022-05-19 | Discharge: 2022-05-19 | Disposition: A | Discharge status: Home / Self Care | Payer: Medicare PPO | Attending: Emergency Medicine | Admitting: Emergency Medicine

## 2022-05-19 ENCOUNTER — Ambulatory Visit (INDEPENDENT_AMBULATORY_CARE_PROVIDER_SITE_OTHER): Payer: Medicare PPO

## 2022-05-19 ENCOUNTER — Encounter (HOSPITAL_COMMUNITY): Payer: Self-pay | Admitting: *Deleted

## 2022-05-19 DIAGNOSIS — R059 Cough, unspecified: Secondary | ICD-10-CM

## 2022-05-19 DIAGNOSIS — U099 Post covid-19 condition, unspecified: Secondary | ICD-10-CM | POA: Diagnosis not present

## 2022-05-19 DIAGNOSIS — J069 Acute upper respiratory infection, unspecified: Secondary | ICD-10-CM | POA: Diagnosis not present

## 2022-05-19 DIAGNOSIS — J989 Respiratory disorder, unspecified: Secondary | ICD-10-CM | POA: Diagnosis not present

## 2022-05-19 DIAGNOSIS — R0602 Shortness of breath: Secondary | ICD-10-CM | POA: Diagnosis not present

## 2022-05-19 DIAGNOSIS — J189 Pneumonia, unspecified organism: Secondary | ICD-10-CM | POA: Diagnosis not present

## 2022-05-19 MED ORDER — PREDNISONE 20 MG PO TABS: 40.0000 mg | ORAL_TABLET | Freq: Every day | ORAL | 0 refills | Status: AC

## 2022-05-19 MED ORDER — ALBUTEROL SULFATE HFA 108 (90 BASE) MCG/ACT IN AERS
INHALATION_SPRAY | RESPIRATORY_TRACT | Status: AC
  Filled 2022-05-19: qty 6.7

## 2022-05-19 MED ORDER — ALBUTEROL SULFATE HFA 108 (90 BASE) MCG/ACT IN AERS
2.0000 | INHALATION_SPRAY | Freq: Once | RESPIRATORY_TRACT | Status: AC
  Administered 2022-05-19: 2 via RESPIRATORY_TRACT

## 2022-05-19 NOTE — ED Provider Notes (Signed)
[place]    CSN: [id number] Arrival date & time: 05/19/22  1342      History   Chief Complaint Chief Complaint  Patient presents with   Sore Throat   Cough   Shortness of Breath   Nasal Congestion    HPI April [name] is a 87 y.o. female.  Presents with nasal congestion, cough, shortness of breath Feels symptoms have been worsening over the last week  She was hospitalized October 2-5 with positive COVID and multifocal pneumonia.  Treated outpatient with oral azithromycin, cefdinir, Paxlovid She saw her primary care provider 10/16.  Told her doctor that she was feeling pretty much back to baseline.  Slight sore throat from cough but overall feeling very well. Today reports she has been feeling worsening symptoms since that visit.  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 coli UTI (urinary tract infection) 10/17/2020   Dysuria 10/13/2020   Chronic diarrhea 10/13/2020   Chronic right-sided low back pain with right-sided sciatica 09/27/2020   Vitamin D deficiency 03/09/2020   Transaminitis 03/09/2020   Bladder distension 10/13/2019   Weight loss, abnormal 43/32/9518   Acute metabolic encephalopathy 84/16/6063   Other constipation 06/18/2019   Physical debility 06/18/2019   Abdominal distension 06/08/2019   Severe pain 06/08/2019   Intractable back pain 06/08/2019    Closed wedge compression fracture of T8 vertebra ([place]) 06/07/2019   Sinus tachycardia 06/07/2019   Chronic pain 06/07/2019   Goals of care, counseling/discussion 05/14/2019   Abnormal liver enzymes 05/14/2019   Dehydration 05/14/2019   Failure to thrive in adult 05/14/2019   Abdominal pain 04/16/2019   Cancer associated pain 04/16/2019   History of breast cancer 06/19/2018   Skin lesion 05/06/2017   Essential hypertension 01/60/1093   Systolic hypertension, isolated 11/16/2016   Central retinal vein occlusion 10/18/2016   Preventive measure 04/28/2015   Thrombocytopenia ([place]) 04/28/2015   Idiopathic scoliosis 10/23/2012   CLL (chronic lymphocytic leukemia) ([place]) 03/02/2009   VENOUS INSUFFICIENCY, CHRONIC 10/28/2008   Dyslipidemia 10/27/2007   Osteoporosis 10/27/2007   SKIN CANCER, HX OF 10/27/2007   DIVERTICULITIS, HX OF 04/08/2007    Past Surgical History:  Procedure Laterality Date   ABDOMINAL HYSTERECTOMY  1980   BREAST IMPLANTS REMOVED  2001   BREAST SURGERY     Bilateral mastectomy   IR RADIOLOGIST EVAL & MGMT  07/01/2019   MASTECTOMY  1982   BILATERAL   OOPHORECTOMY     BSO   PARTIAL COLECTOMY     INTESTINAL ABSCESS WITH SALPINGECTOMY   RECONSTRUCTION LEFT ELBOW     TONSILLECTOMY     UMBILLICAL HERNIA REPAIR      OB History     Gravida  0   Para  0   Term      Preterm      AB  Living         SAB      IAB      Ectopic      Multiple      Live Births               Home Medications    Prior to Admission medications   Medication Sig Start Date End Date Taking? Authorizing Provider  predniSONE (DELTASONE) 20 MG tablet Take 2 tablets (40 mg total) by mouth daily for 5 days. 05/19/22 05/24/22 Yes Lillie Portner, Wells Guiles, PA-C  loperamide (IMODIUM) 2 MG capsule Take 1 capsule (2 mg total) by mouth 4 (four) times daily as needed for diarrhea or loose stools. 08/31/20   Luna Fuse, MD  traMADol (ULTRAM) 50 MG tablet TAKE 1 TABLET BY MOUTH EVERY  12 HOURS AS NEEDED. 05/14/22   Isaac Bliss, Rayford Halsted, MD    Family History Family History  Problem Relation Age of Onset   Heart disease Mother    Heart disease Sister    Cancer Brother        HODGKINS   Cancer Brother        leukemia   Parkinson's disease Brother     Social History Social History   Tobacco Use   Smoking status: Former    Types: Cigarettes    Quit date: 03/26/1955    Years since quitting: 32.1   Smokeless tobacco: Never  Vaping Use   Vaping Use: Never used  Substance Use Topics   Alcohol use: Not Currently   Drug use: Never     Allergies   Codeine, Doxycycline, and Levofloxacin   Review of Systems Review of Systems  Respiratory:  Positive for cough and shortness of breath.    Per HPI  Physical Exam Triage Vital Signs ED Triage Vitals  Enc Vitals Group     BP 05/19/22 1415 138/75     Pulse Rate 05/19/22 1415 83     Resp 05/19/22 1415 20     Temp 05/19/22 1415 98.4 F (36.9 C)     Temp src --      SpO2 05/19/22 1415 96 %     Weight --      Height --      Head Circumference --      Peak Flow --      Pain Score 05/19/22 1412 0     Pain Loc --      Pain Edu? --      Excl. in [place]? --    No data found.  Updated Vital Signs BP 138/75   Pulse 83   Temp 98.4 F (36.9 C)   Resp 20   LMP 03/26/1979   SpO2 96%    Physical Exam Vitals and nursing note reviewed.  Constitutional:      General: She is not in acute distress.    Appearance: She is not ill-appearing.     Comments: No acute distress, speaks clearly  HENT:     Nose: No congestion.     Mouth/Throat:     Pharynx: Oropharynx is clear.  Cardiovascular:     Rate and Rhythm: Normal rate and regular rhythm.     Heart sounds: Normal heart sounds.  Pulmonary:     Effort: Pulmonary effort is normal. No respiratory distress.     Breath sounds: Normal breath sounds. No wheezing, rhonchi or rales.  Musculoskeletal:     Right lower leg: No edema.     Left lower leg: No  edema.     Comments: Thoracic spine curvature  Skin:    General: Skin is warm and dry.  Neurological:     Mental Status: She is alert and oriented to person, place, and time.     UC Treatments / Results  Labs (all labs ordered are listed, but only abnormal results are displayed) Labs Reviewed - No data to display  EKG   Radiology DG Chest 2 View  Result Date: 05/19/2022 CLINICAL DATA:  Cough.  Shortness of breath.  Follow-up pneumonia. EXAM: CHEST - 2 VIEW COMPARISON:  04/30/2022 FINDINGS: The heart size and mediastinal contours are within normal limits. Previously seen areas of pulmonary opacity are no longer visualized. Both lungs are clear. Lower thoracic vertebral body compression fractures again seen as well as moderate thoracic scoliosis. IMPRESSION: No active cardiopulmonary disease. Electronically Signed   By: Marlaine Hind M.D.   On: 05/19/2022 14:51    Procedures Procedures  Medications Ordered in UC Medications  albuterol (VENTOLIN HFA) 108 (90 Base) MCG/ACT inhaler 2 puff (2 puffs Inhalation Given 05/19/22 1515)    Initial Impression / Assessment and Plan / UC Course  I have reviewed the triage vital signs and the nursing notes.  Pertinent labs & imaging results that were available during my care of the patient were reviewed by me and considered in my medical decision making (see chart for details).   She is not in acute distress at this visit although reports she feels short of breath after speaking. Chest x-ray obtained is negative, previous opacities are resolved Potentially some kind of rebound symptoms from covid infection, vs reactive airway disease vs new virus  Offered albuterol inhaler to try in clinic. 2 puffs with improvement. Discussed short course of steroids over the next 5 days to see if symptoms can improve. 40 mg daily x 5 days. Discussed side effects. Recommend to see PCP if symptoms persist. Patient understands to go back to ED with any  worsening symptoms.  Final Clinical Impressions(s) / UC Diagnoses   Final diagnoses:  Viral URI with cough  Post-COVID-19 condition  Reactive airway disease without asthma     Discharge Instructions      You can use the albuterol inhaler as needed every 6 hours to help with shortness of breath or chest tightness.  Take the prednisone once daily for the next 5 days. This can help reduce inflammation in the lungs.  Please go to the emergency department if symptoms worsen.     ED Prescriptions     Medication Sig Dispense Auth. Provider   predniSONE (DELTASONE) 20 MG tablet Take 2 tablets (40 mg total) by mouth daily for 5 days. 10 tablet Layza Summa, Wells Guiles, PA-C      PDMP not reviewed this encounter.   Tremar Wickens, Wells Guiles, [place] 05/19/22 1525

## 2022-05-19 NOTE — ED Triage Notes (Signed)
Pt reports she was hospitalized OCT 2 and DC on Oct 6. Pt reports Problems ever since. Pt report sore throat,congestion,SHOB and nasal congestion in ongoing. Pt reports she also had PNA while she was hospitalized. Pr did a home COVID test at home today that was positive.

## 2022-05-19 NOTE — Discharge Instructions (Addendum)
You can use the albuterol inhaler as needed every 6 hours to help with shortness of breath or chest tightness.  Take the prednisone once daily for the next 5 days. This can help reduce inflammation in the lungs.  Please go to the emergency department if symptoms worsen.

## 2022-05-22 ENCOUNTER — Telehealth: Payer: Self-pay | Admitting: Internal Medicine

## 2022-05-22 ENCOUNTER — Inpatient Hospital Stay: Payer: Medicare PPO | Admitting: Internal Medicine

## 2022-05-22 NOTE — Telephone Encounter (Signed)
Patient is aware that Dr Jerilee Hoh is out of the office. I advised the patient to take Rx as prescribed.

## 2022-05-22 NOTE — Telephone Encounter (Signed)
Pt went to UC on sat for SOB and sore throat and was prescribed prednisone and will not take until dr Jerilee Hoh ok's  it . Please advise

## 2022-05-29 ENCOUNTER — Other Ambulatory Visit: Payer: Self-pay

## 2022-05-29 ENCOUNTER — Inpatient Hospital Stay: Payer: Medicare PPO | Admitting: Hematology and Oncology

## 2022-05-29 ENCOUNTER — Encounter: Payer: Self-pay | Admitting: Hematology and Oncology

## 2022-05-29 ENCOUNTER — Inpatient Hospital Stay: Payer: Medicare PPO | Attending: Hematology and Oncology

## 2022-05-29 VITALS — BP 143/77 | HR 73 | Temp 99.0°F | Resp 18 | Ht 60.0 in | Wt 98.4 lb

## 2022-05-29 DIAGNOSIS — C9110 Chronic lymphocytic leukemia of B-cell type not having achieved remission: Secondary | ICD-10-CM | POA: Insufficient documentation

## 2022-05-29 DIAGNOSIS — D696 Thrombocytopenia, unspecified: Secondary | ICD-10-CM | POA: Insufficient documentation

## 2022-05-29 DIAGNOSIS — M8580 Other specified disorders of bone density and structure, unspecified site: Secondary | ICD-10-CM | POA: Diagnosis not present

## 2022-05-29 DIAGNOSIS — R300 Dysuria: Secondary | ICD-10-CM

## 2022-05-29 DIAGNOSIS — R339 Retention of urine, unspecified: Secondary | ICD-10-CM | POA: Insufficient documentation

## 2022-05-29 LAB — CBC WITH DIFFERENTIAL/PLATELET
Abs Immature Granulocytes: 0 10*3/uL (ref 0.00–0.07)
Basophils Absolute: 0 10*3/uL (ref 0.0–0.1)
Basophils Relative: 0 %
Eosinophils Absolute: 0 10*3/uL (ref 0.0–0.5)
Eosinophils Relative: 0 %
HCT: 37.7 % (ref 36.0–46.0)
Hemoglobin: 12.7 g/dL (ref 12.0–15.0)
Lymphocytes Relative: 93 %
Lymphs Abs: 21.5 10*3/uL — ABNORMAL HIGH (ref 0.7–4.0)
MCH: 34.9 pg — ABNORMAL HIGH (ref 26.0–34.0)
MCHC: 33.7 g/dL (ref 30.0–36.0)
MCV: 103.6 fL — ABNORMAL HIGH (ref 80.0–100.0)
Monocytes Absolute: 0 10*3/uL — ABNORMAL LOW (ref 0.1–1.0)
Monocytes Relative: 0 %
Neutro Abs: 1.6 10*3/uL — ABNORMAL LOW (ref 1.7–7.7)
Neutrophils Relative %: 7 %
Platelets: 80 10*3/uL — ABNORMAL LOW (ref 150–400)
RBC: 3.64 MIL/uL — ABNORMAL LOW (ref 3.87–5.11)
RDW: 13.2 % (ref 11.5–15.5)
Smear Review: NORMAL
WBC: 23.1 10*3/uL — ABNORMAL HIGH (ref 4.0–10.5)
nRBC: 0 % (ref 0.0–0.2)

## 2022-05-29 LAB — URINALYSIS, COMPLETE (UACMP) WITH MICROSCOPIC
Bilirubin Urine: NEGATIVE
Glucose, UA: NEGATIVE mg/dL
Ketones, ur: NEGATIVE mg/dL
Leukocytes,Ua: NEGATIVE
Nitrite: NEGATIVE
Protein, ur: NEGATIVE mg/dL
Specific Gravity, Urine: 1.025 (ref 1.005–1.030)
pH: 6 (ref 5.0–8.0)

## 2022-05-29 LAB — COMPREHENSIVE METABOLIC PANEL
ALT: 11 U/L (ref 0–44)
AST: 17 U/L (ref 15–41)
Albumin: 4 g/dL (ref 3.5–5.0)
Alkaline Phosphatase: 53 U/L (ref 38–126)
Anion gap: 4 — ABNORMAL LOW (ref 5–15)
BUN: 18 mg/dL (ref 8–23)
CO2: 26 mmol/L (ref 22–32)
Calcium: 9.1 mg/dL (ref 8.9–10.3)
Chloride: 110 mmol/L (ref 98–111)
Creatinine, Ser: 0.73 mg/dL (ref 0.44–1.00)
GFR, Estimated: >60 mL/min (ref >60)
Glucose, Bld: 95 mg/dL (ref 70–99)
Potassium: 4.2 mmol/L (ref 3.5–5.1)
Sodium: 140 mmol/L (ref 135–145)
Total Bilirubin: 0.8 mg/dL (ref 0.3–1.2)
Total Protein: 6.7 g/dL (ref 6.5–8.1)

## 2022-05-29 NOTE — Assessment & Plan Note (Signed)
Her leukocytosis is stable but she has slight worsening thrombocytopenia In view of recent infection, it is possible that the slight worsening thrombocytopenia is related to that I plan to see her sooner than 6 months I plan to see her in January which is 3 months away for further assessment

## 2022-05-29 NOTE — Progress Notes (Signed)
[place] OFFICE PROGRESS NOTE  Patient Care Team: Isaac Bliss, Rayford Halsted, MD as PCP - General (Internal Medicine) Julianne Handler, NP (Inactive) as Nurse Practitioner (Hospice and Palliative Medicine)  ASSESSMENT & PLAN:  CLL (chronic lymphocytic leukemia) ([place]) Her leukocytosis is stable but she has slight worsening thrombocytopenia In view of recent infection, it is possible that the slight worsening thrombocytopenia is related to that I plan to see her sooner than 6 months I plan to see her in January which is 3 months away for further assessment  Thrombocytopenia ([place]) She has chronic thrombocytopenia for some time She has slight worsening thrombocytopenia right now but likely due to recent infection She is not symptomatic Observe for now  Dysuria The patient has chronic urinary retention and performs intermittent self catheterization She was recently hospitalized for severe infection She felt that she might have another infection again I will order urinalysis and urine culture and will call with test results in 2 days  Orders Placed This Encounter  Procedures   Urine Culture    Standing Status:   Future    Number of Occurrences:   1    Standing Expiration Date:   05/30/2023   Urinalysis, Complete w Microscopic    Standing Status:   Future    Number of Occurrences:   1    Standing Expiration Date:   05/30/2023    All questions were answered. The patient knows to call the clinic with any problems, questions or concerns. The total time spent in the appointment was 30 minutes encounter with patients including review of chart and various tests results, discussions about plan of care and coordination of care plan   Heath Lark, MD 05/29/2022 12:58 PM  INTERVAL HISTORY: Please see below for problem oriented charting. she returns for follow-up on surveillance for history of CLL She had recurrent infection recently and was hospitalized She was treated  with a course of antibiotics but she said her persistent sore throat She was prescribed prednisone but she did not take it She has frequent bladder infection and recurrent yeast infection She felt that she is not emptying her bladder well and she is concerned about recurrent UTI. The patient denies any recent signs or symptoms of bleeding such as spontaneous epistaxis, hematuria or hematochezia. Recent hospital stay was reviewed  REVIEW OF SYSTEMS:   Constitutional: Denies fevers, chills or abnormal weight loss Eyes: Denies blurriness of vision Respiratory: Denies cough, dyspnea or wheezes Cardiovascular: Denies palpitation, chest discomfort or lower extremity swelling Gastrointestinal:  Denies nausea, heartburn or change in bowel habits Skin: Denies abnormal skin rashes Lymphatics: Denies new lymphadenopathy or easy bruising Neurological:Denies numbness, tingling or new weaknesses Behavioral/Psych: Mood is stable, no new changes  All other systems were reviewed with the patient and are negative.  I have reviewed the past medical history, past surgical history, social history and family history with the patient and they are unchanged from previous note.  ALLERGIES:  is allergic to codeine, doxycycline, and levofloxacin.  MEDICATIONS:  Current Outpatient Medications  Medication Sig Dispense Refill   loperamide (IMODIUM) 2 MG capsule Take 1 capsule (2 mg total) by mouth 4 (four) times daily as needed for diarrhea or loose stools. 8 capsule 0   traMADol (ULTRAM) 50 MG tablet TAKE 1 TABLET BY MOUTH EVERY 12 HOURS AS NEEDED. 30 tablet 2   No current facility-administered medications for this visit.    SUMMARY OF ONCOLOGIC HISTORY: Oncology History Overview Note  Del 20  q   CLL (chronic lymphocytic leukemia) ([place])  03/15/2009 Pathology Results   Case #: [id number]  flow cytometry of peripheral blood comfirmed CLL.   04/25/2017 Pathology Results   FISH panel is positive for deletion 13  q only   04/22/2019 Imaging   Ct scan of the chest, abdomen and pelvis 1. No adenopathy within the chest, abdomen or pelvis. 2. Splenomegaly with scattered low-density lesions measuring up to 1.3 cm. 3. Small subpleural nodule in the left lower lobe and right middle lobe measuring up to 5 mm. No follow-up needed if patient is low-risk (and has no known or suspected primary neoplasm). Non-contrast chest CT can be considered in 12 months if patient is high-risk. This recommendation follows the consensus statement: Guidelines for Management of Incidental Pulmonary Nodules Detected on CT Images: From the Fleischner Society 2017; Radiology 2017; 284:[id number]. 4. Aortic Atherosclerosis (ICD10-I70.0). Coronary artery atherosclerotic calcifications.     04/23/2019 Cancer Staging   Staging form: Chronic Lymphocytic Leukemia / Small Lymphocytic Lymphoma, AJCC 8th Edition - Clinical stage from 04/23/2019: Modified Rai Stage IV (Modified Rai risk: High, Lymphocytosis: Present, Adenopathy: Absent, Organomegaly: Present, Anemia: Absent, Thrombocytopenia: Present) - Signed by Heath Lark, MD on 04/23/2019   04/28/2019 -  Chemotherapy   The patient had Ibrutinib for chemotherapy treatment.     06/01/2019 -  Chemotherapy   The patient had Bendamustine for chemotherapy   06/02/2019 Imaging   Ct head, chest, abdomen and pelvis 1. New T8 compression deformity with approximately 30% height loss. Worsening anterior compression deformity of the T11 vertebrae now with 20% height loss anteriorly. Additional T12 and L2 compression deformities are stable from prior. 2. No other acute traumatic injury within the chest, abdomen, or pelvis. Specifically, no splenic injury or retroperitoneal hematoma. 3. Mild intra and extrahepatic biliary ductal dilatation with a small calcified gallstone seen in the distal common bile duct just proximal to the ampulla of Vater. Correlate with LFTs and consider further evaluation with MRCP as  clinically indicated. 4. Stable sub 5 mm nodules in the right middle and left lower lobe. No further evaluation is required if patient is low risk however should continue with previously recommended 12 month follow-up in September of 2021 if patient is high risk. 5. Severe scoliotic curvature of the thoracolumbar spine with associated chest wall deformity. 6. Duodenal and colonic diverticula without features of diverticulitis. 7. Moderate bladder distention, correlate for features of outlet obstruction or retention. 8. Aortic Atherosclerosis (ICD10-I70.0).   06/07/2019 - 06/10/2019 Hospital Admission   She was admitted for management of LUQ and back pain   06/08/2019 Imaging   MRI abdomen 1. Mild intra and extrahepatic biliary duct prominence with common bile duct measuring upper normal for patient age. No gallstones evident and no definite choledocholithiasis. Somewhat abrupt transition of the duct into the ampulla noted, but nonspecific. ERCP could be used to further evaluate for ampullary lesion as clinically warranted. 2. Hepatic and renal cysts. 3. Presumed marked distention of the urinary bladder.   06/08/2019 Imaging   Ct angiogram No gross evidence of large or central pulmonary emboli is identified on exam limited secondary to suboptimal pulmonary arterial opacification and respiratory motion.   Bibasilar atelectasis.   Osseous demineralization with compression fractures of several thoracic vertebra.   Aneurysmal dilatation ascending thoracic aorta 4.1 cm transverse, recommendation below.   Recommend annual imaging followup by CTA or MRA.   Aortic Atherosclerosis (ICD10-I70.0).   Aortic aneurysm NOS   10/09/2019 Imaging   1. No definite  acute CT findings of the abdomen or pelvis to explain pain. 2. Very distended urinary bladder.  Correlate for urinary retention. 3. Large burden of stool in the distal colon and rectum. Correlate for constipation. 4. Unchanged mild intrahepatic  biliary ductal dilatation. Common bile duct normal in caliber measuring up to 6 mm. No obstructing lesion or calculi identified. 5. No mass or lymphadenopathy in the abdomen or pelvis. 6. Aortic Atherosclerosis (ICD10-I70.0).     PHYSICAL EXAMINATION: ECOG PERFORMANCE STATUS: 1 - Symptomatic but completely ambulatory  Vitals:   05/29/22 1231  BP: (!) 143/77  Pulse: 73  Resp: 18  Temp: 99 F (37.2 C)  SpO2: 100%   Filed Weights   05/29/22 1231  Weight: 98 lb 6.4 oz (44.6 kg)    GENERAL:alert, no distress and comfortable NEURO: alert & oriented x 3 with fluent speech, no focal motor/sensory deficits  LABORATORY DATA:  I have reviewed the data as listed    Component Value Date/Time   NA 140 05/29/2022 1209   NA 141 04/25/2017 1041   K 4.2 05/29/2022 1209   K 4.6 04/25/2017 1041   CL 110 05/29/2022 1209   CL 104 10/27/2012 1209   CO2 26 05/29/2022 1209   CO2 26 04/25/2017 1041   GLUCOSE 95 05/29/2022 1209   GLUCOSE 97 04/25/2017 1041   GLUCOSE 104 (H) 10/27/2012 1209   BUN 18 05/29/2022 1209   BUN 13.1 04/25/2017 1041   CREATININE 0.73 05/29/2022 1209   CREATININE 0.77 03/08/2020 1030   CREATININE 0.8 04/25/2017 1041   CALCIUM 9.1 05/29/2022 1209   CALCIUM 9.8 04/25/2017 1041   PROT 6.7 05/29/2022 1209   PROT 7.5 04/25/2017 1041   ALBUMIN 4.0 05/29/2022 1209   ALBUMIN 4.4 04/25/2017 1041   AST 17 05/29/2022 1209   AST 23 04/25/2017 1041   ALT 11 05/29/2022 1209   ALT 14 04/25/2017 1041   ALKPHOS 53 05/29/2022 1209   ALKPHOS 69 04/25/2017 1041   BILITOT 0.8 05/29/2022 1209   BILITOT 0.95 04/25/2017 1041   GFRNONAA >60 05/29/2022 1209   GFRAA >60 12/03/2019 1055    No results found for: "SPEP", "UPEP"  Lab Results  Component Value Date   WBC 23.1 (H) 05/29/2022   NEUTROABS 1.6 (L) 05/29/2022   HGB 12.7 05/29/2022   HCT 37.7 05/29/2022   MCV 103.6 (H) 05/29/2022   PLT 80 (L) 05/29/2022      Chemistry      Component Value Date/Time   NA 140  05/29/2022 1209   NA 141 04/25/2017 1041   K 4.2 05/29/2022 1209   K 4.6 04/25/2017 1041   CL 110 05/29/2022 1209   CL 104 10/27/2012 1209   CO2 26 05/29/2022 1209   CO2 26 04/25/2017 1041   BUN 18 05/29/2022 1209   BUN 13.1 04/25/2017 1041   CREATININE 0.73 05/29/2022 1209   CREATININE 0.77 03/08/2020 1030   CREATININE 0.8 04/25/2017 1041      Component Value Date/Time   CALCIUM 9.1 05/29/2022 1209   CALCIUM 9.8 04/25/2017 1041   ALKPHOS 53 05/29/2022 1209   ALKPHOS 69 04/25/2017 1041   AST 17 05/29/2022 1209   AST 23 04/25/2017 1041   ALT 11 05/29/2022 1209   ALT 14 04/25/2017 1041   BILITOT 0.8 05/29/2022 1209   BILITOT 0.95 04/25/2017 1041       RADIOGRAPHIC STUDIES: I have personally reviewed the radiological images as listed and agreed with the findings in the report. DG Chest 2  View  Result Date: 05/19/2022 CLINICAL DATA:  Cough.  Shortness of breath.  Follow-up pneumonia. EXAM: CHEST - 2 VIEW COMPARISON:  04/30/2022 FINDINGS: The heart size and mediastinal contours are within normal limits. Previously seen areas of pulmonary opacity are no longer visualized. Both lungs are clear. Lower thoracic vertebral body compression fractures again seen as well as moderate thoracic scoliosis. IMPRESSION: No active cardiopulmonary disease. Electronically Signed   By: Marlaine Hind M.D.   On: 05/19/2022 14:51   DG Chest 2 View  Result Date: 04/30/2022 CLINICAL DATA:  Cough and chest pain. EXAM: CHEST - 2 VIEW COMPARISON:  Chest radiograph 12/24/2021 and earlier FINDINGS: The heart size and mediastinal contours are within normal limits. Subtle patchy airspace opacities noted in the left mid lung. A small patchy airspace opacity is also noted in the right lung with adjacent subsegmental atelectasis versus scarring. No pleural effusion or pneumothorax. Diffuse osteopenia. Rightward curvature of the upper thoracic spine. Stable compression fracture of a midthoracic vertebral body and  anterior wedging of distal thoracic vertebral body. IMPRESSION: Small bilateral patchy airspace opacities, concerning for multifocal pneumonia. Electronically Signed   By: Ileana Roup M.D.   On: 04/30/2022 10:07

## 2022-05-29 NOTE — Assessment & Plan Note (Signed)
The patient has chronic urinary retention and performs intermittent self catheterization She was recently hospitalized for severe infection She felt that she might have another infection again I will order urinalysis and urine culture and will call with test results in 2 days

## 2022-05-29 NOTE — Assessment & Plan Note (Signed)
She has chronic thrombocytopenia for some time She has slight worsening thrombocytopenia right now but likely due to recent infection She is not symptomatic Observe for now

## 2022-05-31 ENCOUNTER — Telehealth: Payer: Self-pay

## 2022-05-31 ENCOUNTER — Ambulatory Visit: Payer: Medicare PPO | Admitting: Radiology

## 2022-05-31 VITALS — BP 120/74

## 2022-05-31 DIAGNOSIS — A498 Other bacterial infections of unspecified site: Secondary | ICD-10-CM | POA: Diagnosis not present

## 2022-05-31 DIAGNOSIS — N949 Unspecified condition associated with female genital organs and menstrual cycle: Secondary | ICD-10-CM

## 2022-05-31 LAB — WET PREP FOR TRICH, YEAST, CLUE

## 2022-05-31 LAB — URINE CULTURE: Culture: 6000 — AB

## 2022-05-31 MED ORDER — SULFAMETHOXAZOLE-TRIMETHOPRIM 800-160 MG PO TABS: 1.0000 | ORAL_TABLET | Freq: Two times a day (BID) | ORAL | 0 refills | Status: DC

## 2022-05-31 NOTE — Telephone Encounter (Signed)
-----   Message from Heath Lark, MD sent at 05/31/2022  7:56 AM EDT ----- Pls call her  Urine culture grew staph (meaning contaminant only) No need antibiotics

## 2022-05-31 NOTE — Telephone Encounter (Signed)
Called and given below message. She verbalized understanding. 

## 2022-05-31 NOTE — Progress Notes (Signed)
      Subjective: April [name] is a 87 y.o. female who complains of  urinary frequency, dysuria and vaginal discomfort, vaginal pain and burning.  Symptoms x's 1 week. Was seen at [facility] and urine culture grew out staph epidermisis, not treated as they called it contamination however it grew out sensitivities.     Review of Systems  All other systems reviewed and are negative.   Past Medical History:  Diagnosis Date   Breast CA ([place])    Bronchitis, chronic (HCC)    Compression fracture of body of thoracic vertebra (HCC)    Eye hemorrhage, left    Leukemia, chronic lymphoid (HCC)    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HCC)    INCREASED RISK FOR LYMPHOMA   Osteoporosis 05/2018   T score -3.1 overall stable from prior study      Objective:  Today's Vitals   05/31/22 1337  BP: 120/74   There is no height or weight on file to calculate BMI.   -General: no acute distress -Vulva: without lesions or discharge -Vagina: scant clear discharge present, wet prep obtained -Perineum: no lesions -Abdomen: SNT -CVAT: negative    Microscopic wet-mount exam shows negative for pathogens, normal epithelial cells.   Chaperone offered and declined.  Assessment:/Plan:   1. Vaginal burning Reassured negative - WET PREP FOR TRICH, YEAST, CLUE  2. Staphylococcus epidermidis infection  - sulfamethoxazole-trimethoprim (BACTRIM DS) 800-160 MG tablet; Take 1 tablet by mouth 2 (two) times daily.  Dispense: 10 tablet; Refill: 0     Push fluids, take all antibiotics as prescribed. Avoid the use of soaps or perfumed products in the peri area. Avoid tub baths and sitting in sweaty or wet clothing for prolonged periods of time.

## 2022-06-18 DIAGNOSIS — N3000 Acute cystitis without hematuria: Secondary | ICD-10-CM | POA: Diagnosis not present

## 2022-06-26 ENCOUNTER — Ambulatory Visit: Payer: Medicare PPO | Admitting: Internal Medicine

## 2022-06-26 ENCOUNTER — Encounter: Payer: Self-pay | Admitting: Internal Medicine

## 2022-06-26 VITALS — BP 110/80 | HR 94 | Temp 98.3°F | Wt 100.3 lb

## 2022-06-26 DIAGNOSIS — N390 Urinary tract infection, site not specified: Secondary | ICD-10-CM | POA: Diagnosis not present

## 2022-06-26 DIAGNOSIS — E785 Hyperlipidemia, unspecified: Secondary | ICD-10-CM

## 2022-06-26 DIAGNOSIS — C9110 Chronic lymphocytic leukemia of B-cell type not having achieved remission: Secondary | ICD-10-CM | POA: Diagnosis not present

## 2022-06-26 DIAGNOSIS — M810 Age-related osteoporosis without current pathological fracture: Secondary | ICD-10-CM

## 2022-06-26 NOTE — Progress Notes (Signed)
Established Patient Office Visit     CC/Reason for Visit: 41-monthfollow-up chronic medical conditions  HPI: April MOESERis a 87y.o. female who is coming in today for the above mentioned reasons. Past Medical History is significant for: Breast cancer, CLL, osteoporosis, recurrent UTIs with intermittent self-catheterization.  In October she was hospitalized for COVID-pneumonia.  She has been doing well since.  It appears her hematologist is concerned about her thrombocytopenia and has scheduled a 311-monthollow-up for repeat labs.  She is otherwise doing well.  She continues to have some low-grade back pain that she manages with 1 daily tramadol.  She has an appointment to see her urologist tomorrow.  Recurrent UTIs continue to be her main issue at this point.   Past Medical/Surgical History: Past Medical History:  Diagnosis Date   [place]   Bronchitis, chronic ([facility])    Comp[place] of [place] ([place]   Eye hemorrhage, left    Leukemia, chronic lymphoid (HCC)    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   INCREASED RISK FOR LYMPHOMA   Osteoporosis 05/2018   T score -3.1 overall stable from prior study    Past Surgical History:  Procedure Laterality Date   ABDOMINAL HYSTERECTOMY  1980   BREAST IMPLANTS REMOVED  2001   BREAST SURGERY     Bilateral mastectomy   IR RADIOLOGIST EVAL & MGMT  07/01/2019   MASTECTOMY  1982   BILATERAL   OOPHORECTOMY     BSO   PARTIAL COLECTOMY     INTESTINAL ABSCESS WITH SALPINGECTOMY   RECONSTRUCTION LEFT ELBOW     TONSILLECTOMY     UMBILLICAL HERNIA REPAIR      Social History:  reports that she quit smoking about 67 years ago. Her smoking use included cigarettes. She has never used smokeless tobacco. She reports that she does not currently use alcohol. She reports that she does not use drugs.  Allergies: Allergies  Allergen Reactions   Codeine Nausea Only   Doxycycline Dermatitis   Levofloxacin Rash     Family History:  Family History  Problem Relation Age of Onset   Heart disease Mother    Heart disease Sister    Cancer Brother        HODGKINS   Cancer Brother        leukemia   Parkinson's disease Brother      Current Outpatient Medications:    loperamide (IMODIUM) 2 MG capsule, Take 1 capsule (2 mg total) by mouth 4 (four) times daily as needed for diarrhea or loose stools., Disp: 8 capsule, Rfl: 0   traMADol (ULTRAM) 50 MG tablet, TAKE 1 TABLET BY MOUTH EVERY 12 HOURS AS NEEDED., Disp: 30 tablet, Rfl: 2  Review of Systems:  Constitutional: Denies fever, chills, diaphoresis, appetite change and fatigue.  HEENT: Denies photophobia, eye pain, redness, hearing loss, ear pain, congestion, sore throat, rhinorrhea, sneezing, mouth sores, trouble swallowing, neck pain, neck stiffness and tinnitus.   Respiratory: Denies SOB, DOE, cough, chest tightness,  and wheezing.   Cardiovascular: Denies chest pain, palpitations and leg swelling.  Gastrointestinal: Denies nausea, vomiting, abdominal pain, diarrhea, constipation, blood in stool and abdominal distention.  Genitourinary: Denies dysuria, urgency, frequency, hematuria, flank pain and difficulty urinating.  Endocrine: Denies: hot or cold intolerance, sweats, changes in hair or nails, polyuria, polydipsia. Musculoskeletal: Positive for myalgias, back pain, joint swelling, arthralgias and gait problem.  Skin: Denies pallor, rash and wound.  Neurological: Denies  dizziness, seizures, syncope, weakness, light-headedness, numbness and headaches.  Hematological: Denies adenopathy. Easy bruising, personal or family bleeding history  Psychiatric/Behavioral: Denies suicidal ideation, mood changes, confusion, nervousness, sleep disturbance and agitation    Physical Exam: Vitals:   06/26/22 1537  BP: 110/80  Pulse: 94  Temp: 98.3 F (36.8 C)  TempSrc: Oral  SpO2: 97%  Weight: 100 lb 4.8 oz (45.5 kg)    Body mass index is 19.59  kg/m.   Constitutional: NAD, calm, comfortable Eyes: PERRL, lids and conjunctivae normal ENMT: Mucous membranes are moist.  Respiratory: clear to auscultation bilaterally, no wheezing, no crackles. Normal respiratory effort. No accessory muscle use.  Cardiovascular: Regular rate and rhythm, no murmurs / rubs / gallops. No extremity edema.   Psychiatric: Normal judgment and insight. Alert and oriented x 3. Normal mood.    Impression and Plan:  CLL (chronic lymphocytic leukemia) ([place])  Dyslipidemia  Osteoporosis without current pathological fracture, unspecified osteoporosis type  Recurrent UTI  -Check lipids when she returns for CPE. -Oncology is concerned about flareup of CLL, she has lab work scheduled in January. -She continues to follow-up with urology in regards to her recurrent UTIs.  She continues intermittent self-catheterization.  Time spent:30 minutes reviewing chart, interviewing and examining patient and formulating plan of care.      Lelon Frohlich, MD [facility]

## 2022-06-27 DIAGNOSIS — R8271 Bacteriuria: Secondary | ICD-10-CM | POA: Diagnosis not present

## 2022-06-27 DIAGNOSIS — N3020 Other chronic cystitis without hematuria: Secondary | ICD-10-CM | POA: Diagnosis not present

## 2022-06-27 DIAGNOSIS — R338 Other retention of urine: Secondary | ICD-10-CM | POA: Diagnosis not present

## 2022-07-05 NOTE — Progress Notes (Signed)
87 y.o. G0P0 Married Caucasian female here for breast and pelvic exam.    She wants to know if she is having yeast infection or bladder infection.   She finished a course of Bactrim a couple of weeks ago.  She is having recurrent UTIs.  Sees Dr. Claudia Desanctis at [facility]. She has pain in the area of her spleen.  Denies dysuria today.   States she uses a catheter for intermittent self catheterization 4 - 5 times a day.   No blood in her urine.   Has back pain all the time due to injury.   No fevers  No vaginal discharge.  Not sure if she is having vulvar irritation.   States urology treated oral thrush for her.   Her last Prolia was 2 - 3 months ago through her PCP.   BMD T score -4.3 on 12/19/21.  PCP:   Lelon Frohlich, MD  Patient's last menstrual period was 03/26/1979.           Sexually active: No.  The current method of family planning is status post hysterectomy.    Exercising: No.   Smoker:  no, former  Health Maintenance: Pap:  2011, normal History of abnormal Pap:  no MMG:  bilateral mastectomy Colonoscopy:  10 years ago BMD:   12/19/21  Result  osteoporotic. TDaP:  PCP Gardasil:   no HIV: no Hep C: no Screening Labs: PCP   reports that she quit smoking about 67 years ago. Her smoking use included cigarettes. She has never used smokeless tobacco. She reports that she does not currently use alcohol. She reports that she does not use drugs.  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[place])    INCREASED RISK FOR LYMPHOMA   Osteoporosis 12/19/2021   severe    Past Surgical History:  Procedure Laterality Date   ABDOMINAL HYSTERECTOMY  1980   BREAST IMPLANTS REMOVED  2001   BREAST SURGERY     Bilateral mastectomy   IR RADIOLOGIST EVAL & MGMT  07/01/2019    MASTECTOMY  1982   BILATERAL   OOPHORECTOMY     BSO   PARTIAL COLECTOMY     INTESTINAL ABSCESS WITH SALPINGECTOMY   RECONSTRUCTION LEFT ELBOW     TONSILLECTOMY     UMBILLICAL HERNIA REPAIR      Current Outpatient Medications  Medication Sig Dispense Refill   loperamide (IMODIUM) 2 MG capsule Take 1 capsule (2 mg total) by mouth 4 (four) times daily as needed for diarrhea or loose stools. 8 capsule 0   traMADol (ULTRAM) 50 MG tablet TAKE 1 TABLET BY MOUTH EVERY 12 HOURS AS NEEDED. 30 tablet 2   No current facility-administered medications for this visit.    Family History  Problem Relation Age of Onset   Heart disease Mother    Heart disease Sister    Cancer Brother        HODGKINS   Cancer Brother        leukemia   Parkinson's disease Brother     Review of Systems  All other systems reviewed and are negative.   Exam:   BP 132/86 (BP Location: Left Arm, Patient Position: Sitting, Cuff Size: Normal)   Pulse 92   Ht '4\' 10"'$  (1.473 m)  Wt 99 lb (44.9 kg)   LMP 03/26/1979   SpO2 98%   BMI 20.69 kg/m     General appearance: alert, cooperative and appears stated age Head: normocephalic, without obvious abnormality, atraumatic Neck: no adenopathy, supple, symmetrical, trachea midline and thyroid normal to inspection and palpation Lungs: clear to auscultation bilaterally Breasts: absent.  No chest wall masses. No axillary adenopathy.  Heart: regular rate and rhythm Abdomen: soft, non-tender; no masses, no organomegaly Extremities: extremities normal, atraumatic, no cyanosis or edema Skin: skin color, texture, turgor normal. No rashes or lesions Lymph nodes: cervical, supraclavicular, and axillary nodes normal. Neurologic: grossly normal  Pelvic: External genitalia:  no lesions              No abnormal inguinal nodes palpated.              Urethra:  normal appearing urethra with no masses, tenderness or lesions              Bartholins and Skenes: normal                  Vagina: normal appearing vagina with normal color and discharge, no lesions              Cervix: absent              Pap taken: no Bimanual Exam:  Uterus:  absent              Adnexa: no mass, fullness, tenderness              Rectal exam: yes.  Confirms.              Anus:  normal sphincter tone, no lesions  Chaperone was present for exam:  Emily  Assessment:   Well woman visit with gynecologic exam. Status post TAH and BSO.  Osteoporosis.  Hx vertebral compression fracture of T8. Severe osteoporosis.  Hx Prolia use.  CLL.  Hx breast cancer.  Status post bilateral mastectomy.  Hx UTI.  Does self cathing.  Vulvar irritation.  Plan:  Pap and HR HPV not indicated.  Mammogram not indicated.  Osteoporosis management through PCP.  Will have her PCP order her next BMD.  Wet prep:  negative.  Urinalysis:  sg 1.015, ph 6.0, negative.  No UC sent.  Fu in 2 years and prn.   After visit summary provided.   20 min  total time was spent for this patient encounter, including preparation, face-to-face counseling with the patient, coordination of care, and documentation of the encounter.

## 2022-07-19 ENCOUNTER — Ambulatory Visit (INDEPENDENT_AMBULATORY_CARE_PROVIDER_SITE_OTHER): Payer: Medicare PPO | Admitting: Obstetrics and Gynecology

## 2022-07-19 ENCOUNTER — Encounter: Payer: Self-pay | Admitting: Obstetrics and Gynecology

## 2022-07-19 VITALS — BP 132/86 | HR 92 | Ht <= 58 in | Wt 99.0 lb

## 2022-07-19 DIAGNOSIS — N9089 Other specified noninflammatory disorders of vulva and perineum: Secondary | ICD-10-CM | POA: Diagnosis not present

## 2022-07-19 DIAGNOSIS — Z9189 Other specified personal risk factors, not elsewhere classified: Secondary | ICD-10-CM

## 2022-07-19 DIAGNOSIS — Z87440 Personal history of urinary (tract) infections: Secondary | ICD-10-CM | POA: Diagnosis not present

## 2022-07-19 DIAGNOSIS — Z01419 Encounter for gynecological examination (general) (routine) without abnormal findings: Secondary | ICD-10-CM

## 2022-07-19 LAB — URINALYSIS, COMPLETE W/RFL CULTURE
Bacteria, UA: NONE SEEN /HPF
Bilirubin Urine: NEGATIVE
Casts: NONE SEEN /LPF
Crystals: NONE SEEN /HPF
Glucose, UA: NEGATIVE
Hgb urine dipstick: NEGATIVE
Hyaline Cast: NONE SEEN /LPF
Ketones, ur: NEGATIVE
Leukocyte Esterase: NEGATIVE
Nitrites, Initial: NEGATIVE
Protein, ur: NEGATIVE
RBC / HPF: NONE SEEN /HPF (ref 0–2)
Specific Gravity, Urine: 1.015 (ref 1.001–1.035)
WBC, UA: NONE SEEN /HPF (ref 0–5)
Yeast: NONE SEEN /HPF
pH: 6 (ref 5.0–8.0)

## 2022-07-19 LAB — WET PREP FOR TRICH, YEAST, CLUE

## 2022-07-19 LAB — NO CULTURE INDICATED

## 2022-07-20 ENCOUNTER — Encounter: Payer: Self-pay | Admitting: Obstetrics and Gynecology

## 2022-08-22 ENCOUNTER — Other Ambulatory Visit: Payer: Self-pay | Admitting: Internal Medicine

## 2022-08-22 DIAGNOSIS — C9110 Chronic lymphocytic leukemia of B-cell type not having achieved remission: Secondary | ICD-10-CM

## 2022-08-29 ENCOUNTER — Other Ambulatory Visit: Payer: Self-pay

## 2022-08-29 DIAGNOSIS — C9110 Chronic lymphocytic leukemia of B-cell type not having achieved remission: Secondary | ICD-10-CM

## 2022-08-30 ENCOUNTER — Other Ambulatory Visit: Payer: Self-pay | Admitting: Hematology and Oncology

## 2022-08-30 ENCOUNTER — Encounter: Payer: Self-pay | Admitting: Internal Medicine

## 2022-08-30 ENCOUNTER — Inpatient Hospital Stay: Payer: Medicare PPO | Admitting: Hematology and Oncology

## 2022-08-30 ENCOUNTER — Other Ambulatory Visit: Payer: Self-pay

## 2022-08-30 ENCOUNTER — Inpatient Hospital Stay: Payer: Medicare PPO | Attending: Hematology and Oncology

## 2022-08-30 ENCOUNTER — Encounter: Payer: Self-pay | Admitting: Hematology and Oncology

## 2022-08-30 ENCOUNTER — Other Ambulatory Visit (HOSPITAL_BASED_OUTPATIENT_CLINIC_OR_DEPARTMENT_OTHER): Payer: Self-pay

## 2022-08-30 VITALS — BP 156/79 | HR 77 | Temp 98.3°F | Resp 18 | Ht <= 58 in | Wt 99.0 lb

## 2022-08-30 DIAGNOSIS — D696 Thrombocytopenia, unspecified: Secondary | ICD-10-CM

## 2022-08-30 DIAGNOSIS — C9110 Chronic lymphocytic leukemia of B-cell type not having achieved remission: Secondary | ICD-10-CM | POA: Diagnosis not present

## 2022-08-30 LAB — CMP (CANCER CENTER ONLY)
ALT: 18 U/L (ref 0–44)
AST: 24 U/L (ref 15–41)
Albumin: 4.1 g/dL (ref 3.5–5.0)
Alkaline Phosphatase: 63 U/L (ref 38–126)
Anion gap: 4 — ABNORMAL LOW (ref 5–15)
BUN: 17 mg/dL (ref 8–23)
CO2: 27 mmol/L (ref 22–32)
Calcium: 9.1 mg/dL (ref 8.9–10.3)
Chloride: 105 mmol/L (ref 98–111)
Creatinine: 0.7 mg/dL (ref 0.44–1.00)
GFR, Estimated: >60 mL/min (ref >60)
Glucose, Bld: 81 mg/dL (ref 70–99)
Potassium: 4 mmol/L (ref 3.5–5.1)
Sodium: 136 mmol/L (ref 135–145)
Total Bilirubin: 0.7 mg/dL (ref 0.3–1.2)
Total Protein: 7 g/dL (ref 6.5–8.1)

## 2022-08-30 LAB — CBC WITH DIFFERENTIAL (CANCER CENTER ONLY)
Abs Immature Granulocytes: 0.02 10*3/uL (ref 0.00–0.07)
Basophils Absolute: 0.1 10*3/uL (ref 0.0–0.1)
Basophils Relative: 0 %
Eosinophils Absolute: 0.1 10*3/uL (ref 0.0–0.5)
Eosinophils Relative: 0 %
HCT: 40.1 % (ref 36.0–46.0)
Hemoglobin: 13.3 g/dL (ref 12.0–15.0)
Immature Granulocytes: 0 %
Lymphocytes Relative: 83 %
Lymphs Abs: 21.6 10*3/uL — ABNORMAL HIGH (ref 0.7–4.0)
MCH: 34.3 pg — ABNORMAL HIGH (ref 26.0–34.0)
MCHC: 33.2 g/dL (ref 30.0–36.0)
MCV: 103.4 fL — ABNORMAL HIGH (ref 80.0–100.0)
Monocytes Absolute: 2.1 10*3/uL — ABNORMAL HIGH (ref 0.1–1.0)
Monocytes Relative: 8 %
Neutro Abs: 2.3 10*3/uL (ref 1.7–7.7)
Neutrophils Relative %: 9 %
Platelet Count: 119 10*3/uL — ABNORMAL LOW (ref 150–400)
RBC: 3.88 MIL/uL (ref 3.87–5.11)
RDW: 13.1 % (ref 11.5–15.5)
Smear Review: NORMAL
WBC Count: 26.3 10*3/uL — ABNORMAL HIGH (ref 4.0–10.5)
nRBC: 0.1 % (ref 0.0–0.2)

## 2022-08-30 MED ORDER — COMIRNATY 30 MCG/0.3ML IM SUSY
PREFILLED_SYRINGE | INTRAMUSCULAR | 0 refills | Status: DC
  Filled 2022-08-30: qty 0.3, 1d supply, fill #0

## 2022-08-30 NOTE — Assessment & Plan Note (Signed)
She has fluctuation of her white count up and down Overall, even though her leukocytosis is slightly worse compared to her prior visit, it is stable compared to her blood work from 9 months ago Her platelet count has improved She is not anemic I recommend close observation only I will see her again in 6 months for further follow-up

## 2022-08-30 NOTE — Assessment & Plan Note (Signed)
She has chronic thrombocytopenia for some time, could be related to CLL She is not symptomatic Observe for now

## 2022-08-30 NOTE — Progress Notes (Signed)
[place] OFFICE PROGRESS NOTE  Patient Care Team: Isaac Bliss, Rayford Halsted, MD as PCP - General (Internal Medicine) Julianne Handler, NP (Inactive) as Nurse Practitioner (Hospice and Palliative Medicine)  ASSESSMENT & PLAN:  CLL (chronic lymphocytic leukemia) ([place]) She has fluctuation of her white count up and down Overall, even though her leukocytosis is slightly worse compared to her prior visit, it is stable compared to her blood work from 9 months ago Her platelet count has improved She is not anemic I recommend close observation only I will see her again in 6 months for further follow-up  Thrombocytopenia ([place]) She has chronic thrombocytopenia for some time, could be related to CLL She is not symptomatic Observe for now  No orders of the defined types were placed in this encounter.   All questions were answered. The patient knows to call the clinic with any problems, questions or concerns. The total time spent in the appointment was 20 minutes encounter with patients including review of chart and various tests results, discussions about plan of care and coordination of care plan   Heath Lark, MD 08/30/2022 1:17 PM  INTERVAL HISTORY: Please see below for problem oriented charting. she returns for surveillance follow-up for history of CLL She is doing well Denies recent infection No recent bleeding Her appetite is fair and she has not lost weight since her last visit  REVIEW OF SYSTEMS:   Constitutional: Denies fevers, chills or abnormal weight loss Eyes: Denies blurriness of vision Ears, nose, mouth, throat, and face: Denies mucositis or sore throat Respiratory: Denies cough, dyspnea or wheezes Cardiovascular: Denies palpitation, chest discomfort or lower extremity swelling Gastrointestinal:  Denies nausea, heartburn or change in bowel habits Skin: Denies abnormal skin rashes Lymphatics: Denies new lymphadenopathy or easy bruising Neurological:Denies  numbness, tingling or new weaknesses Behavioral/Psych: Mood is stable, no new changes  All other systems were reviewed with the patient and are negative.  I have reviewed the past medical history, past surgical history, social history and family history with the patient and they are unchanged from previous note.  ALLERGIES:  is allergic to codeine, doxycycline, and levofloxacin.  MEDICATIONS:  Current Outpatient Medications  Medication Sig Dispense Refill   loperamide (IMODIUM) 2 MG capsule Take 1 capsule (2 mg total) by mouth 4 (four) times daily as needed for diarrhea or loose stools. 8 capsule 0   traMADol (ULTRAM) 50 MG tablet TAKE 1 TABLET BY MOUTH EVERY 12 HOURS AS NEEDED 30 tablet 2   No current facility-administered medications for this visit.    SUMMARY OF ONCOLOGIC HISTORY: Oncology History Overview Note  Del 13 q   CLL (chronic lymphocytic leukemia) ([place])  03/15/2009 Pathology Results   Case #: [id number]  flow cytometry of peripheral blood comfirmed CLL.   04/25/2017 Pathology Results   FISH panel is positive for deletion 13 q only   04/22/2019 Imaging   Ct scan of the chest, abdomen and pelvis 1. No adenopathy within the chest, abdomen or pelvis. 2. Splenomegaly with scattered low-density lesions measuring up to 1.3 cm. 3. Small subpleural nodule in the left lower lobe and right middle lobe measuring up to 5 mm. No follow-up needed if patient is low-risk (and has no known or suspected primary neoplasm). Non-contrast chest CT can be considered in 12 months if patient is high-risk. This recommendation follows the consensus statement: Guidelines for Management of Incidental Pulmonary Nodules Detected on CT Images: From the Fleischner Society 2017; Radiology 2017; 284:[id number]. 4. Aortic Atherosclerosis (  ICD10-I70.0). Coronary artery atherosclerotic calcifications.     04/23/2019 Cancer Staging   Staging form: Chronic Lymphocytic Leukemia / Small Lymphocytic Lymphoma, AJCC 8th  Edition - Clinical stage from 04/23/2019: Modified Rai Stage IV (Modified Rai risk: High, Lymphocytosis: Present, Adenopathy: Absent, Organomegaly: Present, Anemia: Absent, Thrombocytopenia: Present) - Signed by Heath Lark, MD on 04/23/2019   04/28/2019 -  Chemotherapy   The patient had Ibrutinib for chemotherapy treatment.     06/01/2019 -  Chemotherapy   The patient had Bendamustine for chemotherapy   06/02/2019 Imaging   Ct head, chest, abdomen and pelvis 1. New T8 compression deformity with approximately 30% height loss. Worsening anterior compression deformity of the T11 vertebrae now with 20% height loss anteriorly. Additional T12 and L2 compression deformities are stable from prior. 2. No other acute traumatic injury within the chest, abdomen, or pelvis. Specifically, no splenic injury or retroperitoneal hematoma. 3. Mild intra and extrahepatic biliary ductal dilatation with a small calcified gallstone seen in the distal common bile duct just proximal to the ampulla of Vater. Correlate with LFTs and consider further evaluation with MRCP as clinically indicated. 4. Stable sub 5 mm nodules in the right middle and left lower lobe. No further evaluation is required if patient is low risk however should continue with previously recommended 12 month follow-up in September of 2021 if patient is high risk. 5. Severe scoliotic curvature of the thoracolumbar spine with associated chest wall deformity. 6. Duodenal and colonic diverticula without features of diverticulitis. 7. Moderate bladder distention, correlate for features of outlet obstruction or retention. 8. Aortic Atherosclerosis (ICD10-I70.0).   06/07/2019 - 06/10/2019 Hospital Admission   She was admitted for management of LUQ and back pain   06/08/2019 Imaging   MRI abdomen 1. Mild intra and extrahepatic biliary duct prominence with common bile duct measuring upper normal for patient age. No gallstones evident and no definite  choledocholithiasis. Somewhat abrupt transition of the duct into the ampulla noted, but nonspecific. ERCP could be used to further evaluate for ampullary lesion as clinically warranted. 2. Hepatic and renal cysts. 3. Presumed marked distention of the urinary bladder.   06/08/2019 Imaging   Ct angiogram No gross evidence of large or central pulmonary emboli is identified on exam limited secondary to suboptimal pulmonary arterial opacification and respiratory motion.   Bibasilar atelectasis.   Osseous demineralization with compression fractures of several thoracic vertebra.   Aneurysmal dilatation ascending thoracic aorta 4.1 cm transverse, recommendation below.   Recommend annual imaging followup by CTA or MRA.   Aortic Atherosclerosis (ICD10-I70.0).   Aortic aneurysm NOS   10/09/2019 Imaging   1. No definite acute CT findings of the abdomen or pelvis to explain pain. 2. Very distended urinary bladder.  Correlate for urinary retention. 3. Large burden of stool in the distal colon and rectum. Correlate for constipation. 4. Unchanged mild intrahepatic biliary ductal dilatation. Common bile duct normal in caliber measuring up to 6 mm. No obstructing lesion or calculi identified. 5. No mass or lymphadenopathy in the abdomen or pelvis. 6. Aortic Atherosclerosis (ICD10-I70.0).     PHYSICAL EXAMINATION: ECOG PERFORMANCE STATUS: 0 - Asymptomatic  Vitals:   08/30/22 1225  BP: (!) 156/79  Pulse: 77  Resp: 18  Temp: 98.3 F (36.8 C)  SpO2: 99%   Filed Weights   08/30/22 1225  Weight: 99 lb (44.9 kg)    GENERAL:alert, no distress and comfortable  NEURO: alert & oriented x 3 with fluent speech, no focal motor/sensory deficits  LABORATORY DATA:  I have reviewed the data as listed    Component Value Date/Time   NA 136 08/30/2022 1202   NA 141 04/25/2017 1041   K 4.0 08/30/2022 1202   K 4.6 04/25/2017 1041   CL 105 08/30/2022 1202   CL 104 10/27/2012 1209   CO2 27 08/30/2022  1202   CO2 26 04/25/2017 1041   GLUCOSE 81 08/30/2022 1202   GLUCOSE 97 04/25/2017 1041   GLUCOSE 104 (H) 10/27/2012 1209   BUN 17 08/30/2022 1202   BUN 13.1 04/25/2017 1041   CREATININE 0.70 08/30/2022 1202   CREATININE 0.77 03/08/2020 1030   CREATININE 0.8 04/25/2017 1041   CALCIUM 9.1 08/30/2022 1202   CALCIUM 9.8 04/25/2017 1041   PROT 7.0 08/30/2022 1202   PROT 7.5 04/25/2017 1041   ALBUMIN 4.1 08/30/2022 1202   ALBUMIN 4.4 04/25/2017 1041   AST 24 08/30/2022 1202   AST 23 04/25/2017 1041   ALT 18 08/30/2022 1202   ALT 14 04/25/2017 1041   ALKPHOS 63 08/30/2022 1202   ALKPHOS 69 04/25/2017 1041   BILITOT 0.7 08/30/2022 1202   BILITOT 0.95 04/25/2017 1041   GFRNONAA >60 08/30/2022 1202   GFRAA >60 12/03/2019 1055    No results found for: "SPEP", "UPEP"  Lab Results  Component Value Date   WBC 26.3 (H) 08/30/2022   NEUTROABS 2.3 08/30/2022   HGB 13.3 08/30/2022   HCT 40.1 08/30/2022   MCV 103.4 (H) 08/30/2022   PLT 119 (L) 08/30/2022      Chemistry      Component Value Date/Time   NA 136 08/30/2022 1202   NA 141 04/25/2017 1041   K 4.0 08/30/2022 1202   K 4.6 04/25/2017 1041   CL 105 08/30/2022 1202   CL 104 10/27/2012 1209   CO2 27 08/30/2022 1202   CO2 26 04/25/2017 1041   BUN 17 08/30/2022 1202   BUN 13.1 04/25/2017 1041   CREATININE 0.70 08/30/2022 1202   CREATININE 0.77 03/08/2020 1030   CREATININE 0.8 04/25/2017 1041      Component Value Date/Time   CALCIUM 9.1 08/30/2022 1202   CALCIUM 9.8 04/25/2017 1041   ALKPHOS 63 08/30/2022 1202   ALKPHOS 69 04/25/2017 1041   AST 24 08/30/2022 1202   AST 23 04/25/2017 1041   ALT 18 08/30/2022 1202   ALT 14 04/25/2017 1041   BILITOT 0.7 08/30/2022 1202   BILITOT 0.95 04/25/2017 1041

## 2022-08-31 DIAGNOSIS — R3914 Feeling of incomplete bladder emptying: Secondary | ICD-10-CM | POA: Diagnosis not present

## 2022-08-31 DIAGNOSIS — N3020 Other chronic cystitis without hematuria: Secondary | ICD-10-CM | POA: Diagnosis not present

## 2022-08-31 DIAGNOSIS — R8271 Bacteriuria: Secondary | ICD-10-CM | POA: Diagnosis not present

## 2022-09-03 DIAGNOSIS — R339 Retention of urine, unspecified: Secondary | ICD-10-CM | POA: Diagnosis not present

## 2022-10-08 DIAGNOSIS — R339 Retention of urine, unspecified: Secondary | ICD-10-CM | POA: Diagnosis not present

## 2022-10-10 ENCOUNTER — Telehealth: Payer: Self-pay | Admitting: Internal Medicine

## 2022-10-10 NOTE — Telephone Encounter (Signed)
Patient not sure about when she should take her next Prolia shot. Please advise

## 2022-10-10 NOTE — Telephone Encounter (Signed)
I spoke with patient. Scheduled for 3/19 at 2:30. Aware of $40 copay.

## 2022-10-10 NOTE — Telephone Encounter (Signed)
Due anytime, submitted insurance to see cost.

## 2022-10-16 ENCOUNTER — Ambulatory Visit (INDEPENDENT_AMBULATORY_CARE_PROVIDER_SITE_OTHER): Payer: Medicare PPO | Admitting: *Deleted

## 2022-10-16 DIAGNOSIS — M810 Age-related osteoporosis without current pathological fracture: Secondary | ICD-10-CM

## 2022-10-16 MED ORDER — DENOSUMAB 60 MG/ML ~~LOC~~ SOSY
60.0000 mg | PREFILLED_SYRINGE | Freq: Once | SUBCUTANEOUS | Status: AC
  Administered 2022-10-16: 60 mg via SUBCUTANEOUS

## 2022-10-16 NOTE — Progress Notes (Signed)
Per orders of Dr. Hernandez, injection of Prolia given by Woodward Klem. Patient tolerated injection well. 

## 2022-11-18 ENCOUNTER — Other Ambulatory Visit: Payer: Self-pay | Admitting: Internal Medicine

## 2022-11-18 DIAGNOSIS — C9110 Chronic lymphocytic leukemia of B-cell type not having achieved remission: Secondary | ICD-10-CM

## 2022-12-12 ENCOUNTER — Other Ambulatory Visit (HOSPITAL_BASED_OUTPATIENT_CLINIC_OR_DEPARTMENT_OTHER): Payer: Self-pay

## 2022-12-12 MED ORDER — PREVNAR 20 0.5 ML IM SUSY
0.5000 mL | PREFILLED_SYRINGE | Freq: Every day | INTRAMUSCULAR | 0 refills | Status: DC
  Filled 2022-12-12: qty 0.5, 1d supply, fill #0

## 2023-01-03 DIAGNOSIS — M4800 Spinal stenosis, site unspecified: Secondary | ICD-10-CM | POA: Diagnosis not present

## 2023-01-03 DIAGNOSIS — Z87891 Personal history of nicotine dependence: Secondary | ICD-10-CM | POA: Diagnosis not present

## 2023-01-03 DIAGNOSIS — I10 Essential (primary) hypertension: Secondary | ICD-10-CM | POA: Diagnosis not present

## 2023-01-03 DIAGNOSIS — I8390 Asymptomatic varicose veins of unspecified lower extremity: Secondary | ICD-10-CM | POA: Diagnosis not present

## 2023-01-03 DIAGNOSIS — Z8249 Family history of ischemic heart disease and other diseases of the circulatory system: Secondary | ICD-10-CM | POA: Diagnosis not present

## 2023-01-03 DIAGNOSIS — C9510 Chronic leukemia of unspecified cell type not having achieved remission: Secondary | ICD-10-CM | POA: Diagnosis not present

## 2023-01-03 DIAGNOSIS — M1990 Unspecified osteoarthritis, unspecified site: Secondary | ICD-10-CM | POA: Diagnosis not present

## 2023-01-03 DIAGNOSIS — M545 Low back pain, unspecified: Secondary | ICD-10-CM | POA: Diagnosis not present

## 2023-01-03 DIAGNOSIS — M810 Age-related osteoporosis without current pathological fracture: Secondary | ICD-10-CM | POA: Diagnosis not present

## 2023-02-19 DIAGNOSIS — R8271 Bacteriuria: Secondary | ICD-10-CM | POA: Diagnosis not present

## 2023-02-19 DIAGNOSIS — N3020 Other chronic cystitis without hematuria: Secondary | ICD-10-CM | POA: Diagnosis not present

## 2023-02-20 ENCOUNTER — Telehealth: Payer: Self-pay | Admitting: Hematology and Oncology

## 2023-02-20 NOTE — Telephone Encounter (Signed)
Patient is currently sick; patient is aware of rescheduled appointment times and dates

## 2023-02-21 ENCOUNTER — Other Ambulatory Visit: Payer: Self-pay | Admitting: Internal Medicine

## 2023-02-21 DIAGNOSIS — C9110 Chronic lymphocytic leukemia of B-cell type not having achieved remission: Secondary | ICD-10-CM

## 2023-02-28 ENCOUNTER — Ambulatory Visit: Payer: Medicare PPO | Admitting: Hematology and Oncology

## 2023-02-28 ENCOUNTER — Other Ambulatory Visit: Payer: Medicare PPO

## 2023-03-06 DIAGNOSIS — N3020 Other chronic cystitis without hematuria: Secondary | ICD-10-CM | POA: Diagnosis not present

## 2023-03-06 DIAGNOSIS — R8271 Bacteriuria: Secondary | ICD-10-CM | POA: Diagnosis not present

## 2023-03-14 ENCOUNTER — Encounter: Payer: Self-pay | Admitting: Hematology and Oncology

## 2023-03-14 ENCOUNTER — Inpatient Hospital Stay: Payer: Medicare PPO | Admitting: Hematology and Oncology

## 2023-03-14 ENCOUNTER — Inpatient Hospital Stay: Payer: Medicare PPO | Attending: Hematology and Oncology

## 2023-03-18 DIAGNOSIS — H35371 Puckering of macula, right eye: Secondary | ICD-10-CM | POA: Diagnosis not present

## 2023-03-18 DIAGNOSIS — H524 Presbyopia: Secondary | ICD-10-CM | POA: Diagnosis not present

## 2023-04-08 ENCOUNTER — Telehealth: Payer: Self-pay

## 2023-04-08 NOTE — Telephone Encounter (Addendum)
Pt ready for scheduling 04/18/23  Estimated out-of-pocket cost due at time of visit: $40  Primary Insurance: [facility]  Secondary Insurance:N/A  Deductible: $260.08 met out of $4000.  Eligible for co-pay program: No  Prior Auth: Approved PA#: [id number] Valid: 01/26/22-07/30/23  This summary of benefits is an estimation of the patient's out-of-pocket cost. Exact cost may vary based on individual plan coverage.

## 2023-04-11 DIAGNOSIS — L821 Other seborrheic keratosis: Secondary | ICD-10-CM | POA: Diagnosis not present

## 2023-04-11 DIAGNOSIS — Z85828 Personal history of other malignant neoplasm of skin: Secondary | ICD-10-CM | POA: Diagnosis not present

## 2023-04-11 DIAGNOSIS — L570 Actinic keratosis: Secondary | ICD-10-CM | POA: Diagnosis not present

## 2023-04-11 DIAGNOSIS — L853 Xerosis cutis: Secondary | ICD-10-CM | POA: Diagnosis not present

## 2023-04-11 DIAGNOSIS — D692 Other nonthrombocytopenic purpura: Secondary | ICD-10-CM | POA: Diagnosis not present

## 2023-04-11 DIAGNOSIS — L814 Other melanin hyperpigmentation: Secondary | ICD-10-CM | POA: Diagnosis not present

## 2023-04-11 DIAGNOSIS — C866 Primary cutaneous CD30-positive T-cell proliferations: Secondary | ICD-10-CM | POA: Diagnosis not present

## 2023-04-11 DIAGNOSIS — D2271 Melanocytic nevi of right lower limb, including hip: Secondary | ICD-10-CM | POA: Diagnosis not present

## 2023-04-15 DIAGNOSIS — N3020 Other chronic cystitis without hematuria: Secondary | ICD-10-CM | POA: Diagnosis not present

## 2023-05-06 ENCOUNTER — Telehealth: Payer: Self-pay | Admitting: Hematology and Oncology

## 2023-05-06 NOTE — Telephone Encounter (Signed)
Per Staff Message on 05/06/23; I called the patient and rescheduled her appointment. The patient is aware of the new date and time.

## 2023-05-13 ENCOUNTER — Ambulatory Visit (INDEPENDENT_AMBULATORY_CARE_PROVIDER_SITE_OTHER): Payer: Medicare PPO | Admitting: *Deleted

## 2023-05-13 DIAGNOSIS — M810 Age-related osteoporosis without current pathological fracture: Secondary | ICD-10-CM

## 2023-05-13 MED ORDER — DENOSUMAB 60 MG/ML ~~LOC~~ SOSY
60.0000 mg | PREFILLED_SYRINGE | Freq: Once | SUBCUTANEOUS | Status: AC
  Administered 2023-05-13: 60 mg via SUBCUTANEOUS

## 2023-05-13 NOTE — Progress Notes (Signed)
Per orders of Dr. Ardyth Harps, injection of Prolia given by Kern Reap. Patient tolerated injection well.

## 2023-05-17 ENCOUNTER — Ambulatory Visit (INDEPENDENT_AMBULATORY_CARE_PROVIDER_SITE_OTHER): Payer: Medicare PPO

## 2023-05-17 VITALS — BP 120/60 | HR 79 | Temp 97.7°F | Ht <= 58 in | Wt 101.4 lb

## 2023-05-17 DIAGNOSIS — Z0000 Encounter for general adult medical examination without abnormal findings: Secondary | ICD-10-CM

## 2023-05-17 DIAGNOSIS — Z23 Encounter for immunization: Secondary | ICD-10-CM | POA: Diagnosis not present

## 2023-05-17 NOTE — Progress Notes (Signed)
Subjective:   April [name] is a 88 y.o. female who presents for Medicare Annual (Subsequent) preventive examination.  Visit Complete: In person      Objective:    Today's Vitals   05/17/23 1524  BP: 120/60  Pulse: 79  Temp: 97.7 F (36.5 C)  TempSrc: Oral  SpO2: 99%  Weight: 101 lb 6.4 oz (46 kg)  Height: 4\' 10"  (1.473 m)   Body mass index is 21.19 kg/m.     05/17/2023    3:53 PM 05/01/2022   12:00 AM 08/31/2020   12:39 PM 07/20/2020    5:16 PM 07/16/2019   12:47 AM 07/15/2019    1:19 PM 07/09/2019    4:55 PM  Advanced Directives  Does Patient Have a Medical Advance Directive? Yes Yes No No No No No  Type of Estate agent of [place];Living will Healthcare Power of Attorney       Does patient want to make changes to medical advance directive? No - Patient declined No - Patient declined       Copy of Healthcare Power of Attorney in Chart? Yes - validated most recent copy scanned in chart (See row information)        Would patient like information on creating a medical advance directive?  No - Patient declined   No - Patient declined  No - Patient declined    Current Medications (verified) Outpatient Encounter Medications as of 05/17/2023  Medication Sig   traMADol (ULTRAM) 50 MG tablet TAKE 1 TABLET BY MOUTH EVERY 12 HOURS AS NEEDED   COVID-19 mRNA vaccine 2023-2024 (COMIRNATY) syringe Inject into the muscle.   loperamide (IMODIUM) 2 MG capsule Take 1 capsule (2 mg total) by mouth 4 (four) times daily as needed for diarrhea or loose stools.   pneumococcal 20-valent conjugate vaccine (PREVNAR 20) 0.5 ML injection Inject 0.5 mLs into the muscle daily.   No facility-administered encounter medications on file as of 05/17/2023.    Allergies (verified) Codeine, Doxycycline, and Levofloxacin   History: Past Medical History:  Diagnosis Date   Breast CA (HCC)    Bronchitis, chronic (HCC)    Compression fracture of body of thoracic vertebra  (HCC)    Eye hemorrhage, left    Leukemia, chronic lymphoid (HCC)    Lymphomatoid papulosis    INCREASED RISK FOR LYMPHOMA   Osteoporosis 12/19/2021   severe   Past Surgical History:  Procedure Laterality Date   ABDOMINAL HYSTERECTOMY  1980   BREAST IMPLANTS REMOVED  2001   BREAST SURGERY     Bilateral mastectomy   IR RADIOLOGIST EVAL & MGMT  07/01/2019   MASTECTOMY  1982   BILATERAL   OOPHORECTOMY     BSO   PARTIAL COLECTOMY     INTESTINAL ABSCESS WITH SALPINGECTOMY   RECONSTRUCTION LEFT ELBOW     TONSILLECTOMY     UMBILLICAL HERNIA REPAIR     Family History  Problem Relation Age of Onset   Heart disease Mother    Heart disease Sister    Cancer Brother        HODGKINS   Cancer Brother        leukemia   Parkinson's disease Brother    Social History   Socioeconomic History   Marital status: Married    Spouse name: Not on file   Number of children: Not on file   Years of education: Not on file   Highest education level: Not on file  Occupational History  Not on file  Tobacco Use   Smoking status: Former    Current packs/day: 0.00    Types: Cigarettes    Quit date: 03/26/1955    Years since quitting: 68.1   Smokeless tobacco: Never  Vaping Use   Vaping status: Never Used  Substance and Sexual Activity   Alcohol use: Not Currently   Drug use: Never   Sexual activity: Not Currently    Partners: Male    Birth control/protection: Surgical    Comment: HYST-1st intercourse 88 yo-More than 5 partners  Other Topics Concern   Not on file  Social History Narrative   Not on file   Social Determinants of Health   Financial Resource Strain: Low Risk  (05/17/2023)   Overall Financial Resource Strain (CARDIA)    Difficulty of Paying Living Expenses: Not hard at all  Food Insecurity: No Food Insecurity (05/17/2023)   Hunger Vital Sign    Worried About Running Out of Food in the Last Year: Never true    Ran Out of Food in the Last Year: Never true  Transportation  Needs: No Transportation Needs (05/17/2023)   PRAPARE - Administrator, Civil Service (Medical): No    Lack of Transportation (Non-Medical): No  Physical Activity: Inactive (05/17/2023)   Exercise Vital Sign    Days of Exercise per Week: 0 days    Minutes of Exercise per Session: 0 min  Stress: No Stress Concern Present (05/17/2023)   [employer] of Occupational Health - Occupational Stress Questionnaire    Feeling of Stress : Not at all  Social Connections: Moderately Isolated (05/17/2023)   Social Connection and Isolation Panel [NHANES]    Frequency of Communication with Friends and Family: More than three times a week    Frequency of Social Gatherings with Friends and Family: More than three times a week    Attends Religious Services: Never    Database administrator or Organizations: No    Attends Engineer, structural: Never    Marital Status: Married    Tobacco Counseling Counseling given: Not Answered   Clinical Intake:  Pre-visit preparation completed: Yes  Pain : No/denies pain     BMI - recorded: 21.19 Nutritional Status: BMI of 19-24  Normal Nutritional Risks: None Diabetes: No  How often do you need to have someone help you when you read instructions, pamphlets, or other written materials from your doctor or pharmacy?: 1 - Never  Interpreter Needed?: No  Information entered by :: Theresa Mulligan LPN   Activities of Daily Living    05/17/2023    3:50 PM  In your present state of health, do you have any difficulty performing the following activities:  Hearing? 0  Vision? 0  Difficulty concentrating or making decisions? 0  Walking or climbing stairs? 0  Dressing or bathing? 0  Doing errands, shopping? 0  Preparing Food and eating ? N  Using the Toilet? N  In the past six months, have you accidently leaked urine? N  Do you have problems with loss of bowel control? N  Managing your Medications? N  Managing your Finances? N   Housekeeping or managing your Housekeeping? N    Patient Care Team: Philip Aspen, Limmie Patricia, MD as PCP - General (Internal Medicine) Shara Blazing Gerald Leitz, NP (Inactive) as Nurse Practitioner [facility] and Palliative Medicine)  Indicate any recent Medical Services you may have received from other than [facility] providers in the past year (date may be approximate).  Assessment:   This is a routine wellness examination for Kimya.  Hearing/Vision screen Hearing Screening - Comments:: Denies hearing difficulties   Vision Screening - Comments:: Wears rx glasses - up to date with routine eye exams with  [facility]   Goals Addressed               This Visit's Progress     Stay active (pt-stated)        Take care of my 20 yr old husband.       Depression Screen    05/17/2023    3:32 PM 12/20/2021   11:07 AM 12/12/2021    1:20 PM 10/25/2020    2:00 PM 08/25/2020    9:55 AM 08/12/2019    3:43 PM 07/17/2018   10:24 AM  PHQ 2/9 Scores  PHQ - 2 Score 0 0 0 0 0 0 0  PHQ- 9 Score  0 0  4 5     Fall Risk    05/17/2023    3:51 PM 12/12/2021    1:21 PM 10/25/2020    2:01 PM 08/02/2020   11:45 AM 08/12/2019    3:43 PM  Fall Risk   Falls in the past year? 0 0 1 1 0  Number falls in past yr: 0 0 1 0 0  Injury with Fall? 0 0 1 1 0  Risk for fall due to : No Fall Risks No Fall Risks Impaired balance/gait;History of fall(s)    Follow up Falls prevention discussed Falls evaluation completed Falls evaluation completed      MEDICARE RISK AT HOME: Medicare Risk at Home Any stairs in or around the home?: Yes If so, are there any without handrails?: No Home free of loose throw rugs in walkways, pet beds, electrical cords, etc?: Yes Adequate lighting in your home to reduce risk of falls?: Yes Life alert?: No Use of a cane, walker or w/c?: No Grab bars in the bathroom?: Yes Shower chair or bench in shower?: Yes Elevated toilet seat or a handicapped toilet?: Yes  TIMED UP AND GO:  Was  the test performed?  Yes  Length of time to ambulate 10 feet: 10 sec Gait slow and steady without use of assistive device    Cognitive Function:        05/17/2023    3:53 PM  6CIT Screen  What Year? 0 points  What month? 0 points  What time? 0 points  Count back from 20 0 points  Months in reverse 0 points  Repeat phrase 0 points  Total Score 0 points    Immunizations Immunization History  Administered Date(s) Administered   Fluad Quad(high Dose 65+) 04/23/2019, 04/12/2020, 05/02/2021, 04/26/2022   Fluad Trivalent(High Dose 65+) 05/17/2023   Influenza Split 04/30/2011, 04/25/2012, 04/24/2016   Influenza Whole 05/09/2007, 05/06/2008, 04/26/2009, 05/02/2010   Influenza,inj,Quad PF,6+ Mos 04/29/2013, 04/06/2014, 04/28/2015, 04/24/2016, 04/25/2017, 04/24/2018   PFIZER Comirnaty(Gray Top)Covid-19 Tri-Sucrose Vaccine 12/08/2020   PFIZER(Purple Top)SARS-COV-2 Vaccination 08/21/2019, 09/11/2019, 05/14/2020   PNEUMOCOCCAL CONJUGATE-20 12/12/2022   Pfizer Covid-19 Vaccine Bivalent Booster 29yrs & up 05/26/2021   Pfizer(Comirnaty)Fall Seasonal Vaccine 12 years and older 08/30/2022   Pneumococcal Conjugate-13 01/03/2015   Pneumococcal Polysaccharide-23 04/29/2013   Td 02/17/2009    TDAP status: Due, Education has been provided regarding the importance of this vaccine. Advised may receive this vaccine at local pharmacy or Health Dept. Aware to provide a copy of the vaccination record if obtained from local pharmacy or Health Dept. Verbalized acceptance and understanding.  Flu Vaccine status: Completed at today's visit  Pneumococcal vaccine status: Up to date  Covid-19 vaccine status: Declined, Education has been provided regarding the importance of this vaccine but patient still declined. Advised may receive this vaccine at local pharmacy or Health Dept.or vaccine clinic. Aware to provide a copy of the vaccination record if obtained from local pharmacy or Health Dept. Verbalized  acceptance and understanding.  Qualifies for Shingles Vaccine? Yes   Zostavax completed No   Shingrix Completed?: No.    Education has been provided regarding the importance of this vaccine. Patient has been advised to call insurance company to determine out of pocket expense if they have not yet received this vaccine. Advised may also receive vaccine at local pharmacy or Health Dept. Verbalized acceptance and understanding.  Screening Tests Health Maintenance  Topic Date Due   Zoster Vaccines- Shingrix (1 of 2) Never done   DTaP/Tdap/Td (2 - Tdap) 02/18/2019   COVID-19 Vaccine (7 - 2023-24 season) 03/31/2023   Medicare Annual Wellness (AWV)  05/16/2024   Pneumonia Vaccine 52+ Years old  Completed   INFLUENZA VACCINE  Completed   DEXA SCAN  Completed   HPV VACCINES  Aged Out    Health Maintenance  Health Maintenance Due  Topic Date Due   Zoster Vaccines- Shingrix (1 of 2) Never done   DTaP/Tdap/Td (2 - Tdap) 02/18/2019   COVID-19 Vaccine (7 - 2023-24 season) 03/31/2023         Bone Density status: Completed 12/19/21. Results reflect: Bone density results: OSTEOPOROSIS. Repeat every   years.     Additional Screening:    Vision Screening: Recommended annual ophthalmology exams for early detection of glaucoma and other disorders of the eye. Is the patient up to date with their annual eye exam?  Yes  Who is the provider or what is the name of the office in which the patient attends annual eye exams? [facility] If pt is not established with a provider, would they like to be referred to a provider to establish care? No .   Dental Screening: Recommended annual dental exams for proper oral hygiene     Community Resource Referral / Chronic Care Management:  CRR required this visit?  No   CCM required this visit?  No     Plan:     I have personally reviewed and noted the following in the patient's chart:   Medical and social history Use of alcohol, tobacco  or illicit drugs  Current medications and supplements including opioid prescriptions. Patient is currently taking opioid prescriptions. Information provided to patient regarding non-opioid alternatives. Patient advised to discuss non-opioid treatment plan with their provider. Functional ability and status Nutritional status Physical activity Advanced directives List of other physicians Hospitalizations, surgeries, and ER visits in previous 12 months Vitals Screenings to include cognitive, depression, and falls Referrals and appointments  In addition, I have reviewed and discussed with patient certain preventive protocols, quality metrics, and best practice recommendations. A written personalized care plan for preventive services as well as general preventive health recommendations were provided to patient.     Tillie Rung, LPN   [id number]/[id number]   After Visit Summary: Given Nurse Notes: None

## 2023-05-17 NOTE — Patient Instructions (Signed)
April [name] , Thank you for taking time to come for your Medicare Wellness Visit. I appreciate your ongoing commitment to your health goals. Please review the following plan we discussed and let me know if I can assist you in the future.   Referrals/Orders/Follow-Ups/Clinician Recommendations:   This is a list of the screening recommended for you and due dates:  Health Maintenance  Topic Date Due   Zoster (Shingles) Vaccine (1 of 2) Never done   DTaP/Tdap/Td vaccine (2 - Tdap) 02/18/2019   COVID-19 Vaccine (7 - 2023-24 season) 03/31/2023   Medicare Annual Wellness Visit  05/16/2024   Pneumonia Vaccine  Completed   Flu Shot  Completed   DEXA scan (bone density measurement)  Completed   HPV Vaccine  Aged Out   Opioid Pain Medicine Management Opioids are powerful medicines that are used to treat moderate to severe pain. When used for short periods of time, they can help you to: Sleep better. Do better in physical or occupational therapy. Feel better in the first few days after an injury. Recover from surgery. Opioids should be taken with the supervision of a trained health care provider. They should be taken for the shortest period of time possible. This is because opioids can be addictive, and the longer you take opioids, the greater your risk of addiction. This addiction can also be called opioid use disorder. What are the risks? Using opioid pain medicines for longer than 3 days increases your risk of side effects. Side effects include: Constipation. Nausea and vomiting. Breathing difficulties (respiratory depression). Drowsiness. Confusion. Opioid use disorder. Itching. Taking opioid pain medicine for a long period of time can affect your ability to do daily tasks. It also puts you at risk for: Motor vehicle crashes. Depression. Suicide. Heart attack. Overdose, which can be life-threatening. What is a pain treatment plan? A pain treatment plan is an agreement between you and  your health care provider. Pain is unique to each person, and treatments vary depending on your condition. To manage your pain, you and your health care provider need to work together. To help you do this: Discuss the goals of your treatment, including how much pain you might expect to have and how you will manage the pain. Review the risks and benefits of taking opioid medicines. Remember that a good treatment plan uses more than one approach and minimizes the chance of side effects. Be honest about the amount of medicines you take and about any drug or alcohol use. Get pain medicine prescriptions from only one health care provider. Pain can be managed with many types of alternative treatments. Ask your health care provider to refer you to one or more specialists who can help you manage pain through: Physical or occupational therapy. Counseling (cognitive behavioral therapy). Good nutrition. Biofeedback. Massage. Meditation. Non-opioid medicine. Following a gentle exercise program. How to use opioid pain medicine Taking medicine Take your pain medicine exactly as told by your health care provider. Take it only when you need it. If your pain gets less severe, you may take less than your prescribed dose if your health care provider approves. If you are not having pain, do nottake pain medicine unless your health care provider tells you to take it. If your pain is severe, do nottry to treat it yourself by taking more pills than instructed on your prescription. Contact your health care provider for help. Write down the times when you take your pain medicine. It is easy to become confused while on  pain medicine. Writing the time can help you avoid overdose. Take other over-the-counter or prescription medicines only as told by your health care provider. Keeping yourself and others safe  While you are taking opioid pain medicine: Do not drive, use machinery, or power tools. Do not sign legal  documents. Do not drink alcohol. Do not take sleeping pills. Do not supervise children by yourself. Do not do activities that require climbing or being in high places. Do not go to a lake, river, ocean, spa, or swimming pool. Do not share your pain medicine with anyone. Keep pain medicine in a locked cabinet or in a secure area where pets and children cannot reach it. Stopping your use of opioids If you have been taking opioid medicine for more than a few weeks, you may need to slowly decrease (taper) how much you take until you stop completely. Tapering your use of opioids can decrease your risk of symptoms of withdrawal, such as: Pain and cramping in the abdomen. Nausea. Sweating. Sleepiness. Restlessness. Uncontrollable shaking (tremors). Cravings for the medicine. Do not attempt to taper your use of opioids on your own. Talk with your health care provider about how to do this. Your health care provider may prescribe a step-down schedule based on how much medicine you are taking and how long you have been taking it. Getting rid of leftover pills Do not save any leftover pills. Get rid of leftover pills safely by: Taking the medicine to a prescription take-back program. This is usually offered by the county or law enforcement. Bringing them to a pharmacy that has a drug disposal container. Flushing them down the toilet. Check the label or package insert of your medicine to see whether this is safe to do. Throwing them out in the trash. Check the label or package insert of your medicine to see whether this is safe to do. If it is safe to throw it out, remove the medicine from the original container, put it into a sealable bag or container, and mix it with used coffee grounds, food scraps, dirt, or cat litter before putting it in the trash. Follow these instructions at home: Activity Do exercises as told by your health care provider. Avoid activities that make your pain worse. Return to  your normal activities as told by your health care provider. Ask your health care provider what activities are safe for you. General instructions You may need to take these actions to prevent or treat constipation: Drink enough fluid to keep your urine pale yellow. Take over-the-counter or prescription medicines. Eat foods that are high in fiber, such as beans, whole grains, and fresh fruits and vegetables. Limit foods that are high in fat and processed sugars, such as fried or sweet foods. Keep all follow-up visits. This is important. Where to find support If you have been taking opioids for a long time, you may benefit from receiving support for quitting from a local support group or counselor. Ask your health care provider for a referral to these resources in your area. Where to find more information Centers for Disease Control and Prevention (CDC): FootballExhibition.com.br [place] Food and Drug Administration (FDA): PumpkinSearch.com.ee Get help right away if: You may have taken too much of an opioid (overdosed). Common symptoms of an overdose: Your breathing is slower or more shallow than normal. You have a very slow heartbeat (pulse). You have slurred speech. You have nausea and vomiting. Your pupils become very small. You have other potential symptoms: You are very confused.  You faint or feel like you will faint. You have cold, clammy skin. You have blue lips or fingernails. You have thoughts of harming yourself or harming others. These symptoms may represent a serious problem that is an emergency. Do not wait to see if the symptoms will go away. Get medical help right away. Call your local emergency services (911 in the [place] Do not drive yourself to the hospital.  If you ever feel like you may hurt yourself or others, or have thoughts about taking your own life, get help right away. Go to your nearest emergency department or: Call your local emergency services (911 in the [place] Call the [facility] (623-342-1777 in the [place] Call a suicide crisis helpline, such as the National Suicide Prevention Lifeline at (564)488-6175 or 988 in the [place] This is open 24 hours a day in the [place] Text the Crisis Text Line at 563-263-1878 (in the [place] Summary Opioid medicines can help you manage moderate to severe pain for a short period of time. A pain treatment plan is an agreement between you and your health care provider. Discuss the goals of your treatment, including how much pain you might expect to have and how you will manage the pain. If you think that you or someone else may have taken too much of an opioid, get medical help right away. This information is not intended to replace advice given to you by your health care provider. Make sure you discuss any questions you have with your health care provider. Document Revised: 02/08/2021 Document Reviewed: 10/26/2020 Elsevier Patient Education  2024 [employer]  Advanced directives: (In Chart) A copy of your advanced directives are scanned into your chart should your provider ever need it.  Next Medicare Annual Wellness Visit scheduled for next year: Yes

## 2023-05-23 ENCOUNTER — Inpatient Hospital Stay: Payer: Medicare PPO | Attending: Hematology and Oncology

## 2023-05-23 ENCOUNTER — Inpatient Hospital Stay: Payer: Medicare PPO | Admitting: Hematology and Oncology

## 2023-05-23 ENCOUNTER — Other Ambulatory Visit: Payer: Self-pay

## 2023-05-23 ENCOUNTER — Encounter: Payer: Self-pay | Admitting: Hematology and Oncology

## 2023-05-23 VITALS — BP 157/70 | HR 74 | Temp 97.8°F | Resp 18 | Ht <= 58 in | Wt 102.4 lb

## 2023-05-23 DIAGNOSIS — D696 Thrombocytopenia, unspecified: Secondary | ICD-10-CM | POA: Diagnosis not present

## 2023-05-23 DIAGNOSIS — C9110 Chronic lymphocytic leukemia of B-cell type not having achieved remission: Secondary | ICD-10-CM | POA: Diagnosis not present

## 2023-05-23 DIAGNOSIS — N3020 Other chronic cystitis without hematuria: Secondary | ICD-10-CM | POA: Diagnosis not present

## 2023-05-23 DIAGNOSIS — R8279 Other abnormal findings on microbiological examination of urine: Secondary | ICD-10-CM | POA: Diagnosis not present

## 2023-05-23 LAB — CMP (CANCER CENTER ONLY)
ALT: 25 U/L (ref 0–44)
AST: 25 U/L (ref 15–41)
Albumin: 3.9 g/dL (ref 3.5–5.0)
Alkaline Phosphatase: 60 U/L (ref 38–126)
Anion gap: 6 (ref 5–15)
BUN: 18 mg/dL (ref 8–23)
CO2: 23 mmol/L (ref 22–32)
Calcium: 8.6 mg/dL — ABNORMAL LOW (ref 8.9–10.3)
Chloride: 108 mmol/L (ref 98–111)
Creatinine: 0.74 mg/dL (ref 0.44–1.00)
GFR, Estimated: >60 mL/min (ref >60)
Glucose, Bld: 93 mg/dL (ref 70–99)
Potassium: 3.9 mmol/L (ref 3.5–5.1)
Sodium: 137 mmol/L (ref 135–145)
Total Bilirubin: 0.8 mg/dL (ref 0.3–1.2)
Total Protein: 6.4 g/dL — ABNORMAL LOW (ref 6.5–8.1)

## 2023-05-23 LAB — CBC WITH DIFFERENTIAL/PLATELET
Abs Immature Granulocytes: 0.03 10*3/uL (ref 0.00–0.07)
Basophils Absolute: 0.1 10*3/uL (ref 0.0–0.1)
Basophils Relative: 0 %
Eosinophils Absolute: 0.1 10*3/uL (ref 0.0–0.5)
Eosinophils Relative: 0 %
HCT: 34.8 % — ABNORMAL LOW (ref 36.0–46.0)
Hemoglobin: 12 g/dL (ref 12.0–15.0)
Immature Granulocytes: 0 %
Lymphocytes Relative: 85 %
Lymphs Abs: 28.5 10*3/uL — ABNORMAL HIGH (ref 0.7–4.0)
MCH: 35.5 pg — ABNORMAL HIGH (ref 26.0–34.0)
MCHC: 34.5 g/dL (ref 30.0–36.0)
MCV: 103 fL — ABNORMAL HIGH (ref 80.0–100.0)
Monocytes Absolute: 2.9 10*3/uL — ABNORMAL HIGH (ref 0.1–1.0)
Monocytes Relative: 9 %
Neutro Abs: 1.9 10*3/uL (ref 1.7–7.7)
Neutrophils Relative %: 6 %
Platelets: 86 10*3/uL — ABNORMAL LOW (ref 150–400)
RBC: 3.38 MIL/uL — ABNORMAL LOW (ref 3.87–5.11)
RDW: 13.6 % (ref 11.5–15.5)
Smear Review: NORMAL
WBC: 33.4 10*3/uL — ABNORMAL HIGH (ref 4.0–10.5)
nRBC: 0.1 % (ref 0.0–0.2)

## 2023-05-23 NOTE — Progress Notes (Signed)
[facility] OFFICE PROGRESS NOTE  Patient Care Team: Philip Aspen, Limmie Patricia, MD as PCP - General (Internal Medicine) Anselm Lis, NP (Inactive) as Nurse Practitioner (Hospice and Palliative Medicine)  ASSESSMENT & PLAN:  CLL (chronic lymphocytic leukemia) (HCC) Overall, even though her leukocytosis is slightly worse compared to her prior visit Her platelet count is lower but comparable with last year's count She is not anemic Overall, she has slight progression of CLL but not at the point she needs treatment However, given progression of leukocytosis, I plan to see her back in 3 months instead of waiting for 6 months I will order additional workup for evaluation of thrombocytopenia She might need to be restarted back on treatment soon   Thrombocytopenia (HCC) She has chronic thrombocytopenia for some time, could be related to CLL She is not symptomatic Observe for now  Orders Placed This Encounter  Procedures   Vitamin B12    Standing Status:   Future    Standing Expiration Date:   05/22/2024   Lactate dehydrogenase    Standing Status:   Future    Standing Expiration Date:   05/22/2024   Hepatitis B surface antibody,qualitative    Standing Status:   Future    Standing Expiration Date:   05/22/2024   Hepatitis B surface antigen    Standing Status:   Future    Standing Expiration Date:   05/22/2024   Hepatitis B core antibody, IgM    Standing Status:   Future    Standing Expiration Date:   05/22/2024   CMP (Cancer Center only)    Standing Status:   Future    Standing Expiration Date:   05/22/2024   CBC with Differential (Cancer Center Only)    Standing Status:   Future    Standing Expiration Date:   05/22/2024   IgG, IgA, IgM    Standing Status:   Future    Standing Expiration Date:   05/22/2024    All questions were answered. The patient knows to call the clinic with any problems, questions or concerns. The total time spent in the appointment was  20 minutes encounter with patients including review of chart and various tests results, discussions about plan of care and coordination of care plan   Artis Delay, MD 05/23/2023 11:04 AM  INTERVAL HISTORY: Please see below for problem oriented charting. she returns for surveillance follow-up for history of CLL Since last time I saw her, she continues to have frequent urinary tract infection She needs self cath at least 3 times per day She has noted intermittent bleeding when she self cath but no other signs of bleeding We discussed test results and future follow-up  REVIEW OF SYSTEMS:   Constitutional: Denies fevers, chills or abnormal weight loss Eyes: Denies blurriness of vision Ears, nose, mouth, throat, and face: Denies mucositis or sore throat Respiratory: Denies cough, dyspnea or wheezes Cardiovascular: Denies palpitation, chest discomfort or lower extremity swelling Gastrointestinal:  Denies nausea, heartburn or change in bowel habits Skin: Denies abnormal skin rashes Lymphatics: Denies new lymphadenopathy or easy bruising Neurological:Denies numbness, tingling or new weaknesses Behavioral/Psych: Mood is stable, no new changes  All other systems were reviewed with the patient and are negative.  I have reviewed the past medical history, past surgical history, social history and family history with the patient and they are unchanged from previous note.  ALLERGIES:  is allergic to codeine, doxycycline, and levofloxacin.  MEDICATIONS:  Current Outpatient Medications  Medication Sig  Dispense Refill   Multiple Vitamins-Minerals (MULTIVITAMIN WITH MINERALS) tablet Take 1 tablet by mouth daily.     loperamide (IMODIUM) 2 MG capsule Take 1 capsule (2 mg total) by mouth 4 (four) times daily as needed for diarrhea or loose stools. 8 capsule 0   traMADol (ULTRAM) 50 MG tablet TAKE 1 TABLET BY MOUTH EVERY 12 HOURS AS NEEDED 30 tablet 2   No current facility-administered medications for  this visit.    SUMMARY OF ONCOLOGIC HISTORY: Oncology History Overview Note  Del 13 q   CLL (chronic lymphocytic leukemia) (HCC)  03/15/2009 Pathology Results   Case #: [id number]  flow cytometry of peripheral blood comfirmed CLL.   04/25/2017 Pathology Results   FISH panel is positive for deletion 13 q only   04/22/2019 Imaging   Ct scan of the chest, abdomen and pelvis 1. No adenopathy within the chest, abdomen or pelvis. 2. Splenomegaly with scattered low-density lesions measuring up to 1.3 cm. 3. Small subpleural nodule in the left lower lobe and right middle lobe measuring up to 5 mm. No follow-up needed if patient is low-risk (and has no known or suspected primary neoplasm). Non-contrast chest CT can be considered in 12 months if patient is high-risk. This recommendation follows the consensus statement: Guidelines for Management of Incidental Pulmonary Nodules Detected on CT Images: From the Fleischner Society 2017; Radiology 2017; 284:228-243. 4. Aortic Atherosclerosis (ICD10-I70.0). Coronary artery atherosclerotic calcifications.     04/23/2019 Cancer Staging   Staging form: Chronic Lymphocytic Leukemia / Small Lymphocytic Lymphoma, AJCC 8th Edition - Clinical stage from 04/23/2019: Modified Rai Stage IV (Modified Rai risk: High, Lymphocytosis: Present, Adenopathy: Absent, Organomegaly: Present, Anemia: Absent, Thrombocytopenia: Present) - Signed by Artis Delay, MD on 04/23/2019   04/28/2019 -  Chemotherapy   The patient had Ibrutinib for chemotherapy treatment.     06/01/2019 -  Chemotherapy   The patient had Bendamustine for chemotherapy   06/02/2019 Imaging   Ct head, chest, abdomen and pelvis 1. New T8 compression deformity with approximately 30% height loss. Worsening anterior compression deformity of the T11 vertebrae now with 20% height loss anteriorly. Additional T12 and L2 compression deformities are stable from prior. 2. No other acute traumatic injury within the chest,  abdomen, or pelvis. Specifically, no splenic injury or retroperitoneal hematoma. 3. Mild intra and extrahepatic biliary ductal dilatation with a small calcified gallstone seen in the distal common bile duct just proximal to the ampulla of Vater. Correlate with LFTs and consider further evaluation with MRCP as clinically indicated. 4. Stable sub 5 mm nodules in the right middle and left lower lobe. No further evaluation is required if patient is low risk however should continue with previously recommended 12 month follow-up in September of 2021 if patient is high risk. 5. Severe scoliotic curvature of the thoracolumbar spine with associated chest wall deformity. 6. Duodenal and colonic diverticula without features of diverticulitis. 7. Moderate bladder distention, correlate for features of outlet obstruction or retention. 8. Aortic Atherosclerosis (ICD10-I70.0).   06/07/2019 - 06/10/2019 Hospital Admission   She was admitted for management of LUQ and back pain   06/08/2019 Imaging   MRI abdomen 1. Mild intra and extrahepatic biliary duct prominence with common bile duct measuring upper normal for patient age. No gallstones evident and no definite choledocholithiasis. Somewhat abrupt transition of the duct into the ampulla noted, but nonspecific. ERCP could be used to further evaluate for ampullary lesion as clinically warranted. 2. Hepatic and renal cysts. 3. Presumed marked distention of  the urinary bladder.   06/08/2019 Imaging   Ct angiogram No gross evidence of large or central pulmonary emboli is identified on exam limited secondary to suboptimal pulmonary arterial opacification and respiratory motion.   Bibasilar atelectasis.   Osseous demineralization with compression fractures of several thoracic vertebra.   Aneurysmal dilatation ascending thoracic aorta 4.1 cm transverse, recommendation below.   Recommend annual imaging followup by CTA or MRA.   Aortic Atherosclerosis  (ICD10-I70.0).   Aortic aneurysm NOS   10/09/2019 Imaging   1. No definite acute CT findings of the abdomen or pelvis to explain pain. 2. Very distended urinary bladder.  Correlate for urinary retention. 3. Large burden of stool in the distal colon and rectum. Correlate for constipation. 4. Unchanged mild intrahepatic biliary ductal dilatation. Common bile duct normal in caliber measuring up to 6 mm. No obstructing lesion or calculi identified. 5. No mass or lymphadenopathy in the abdomen or pelvis. 6. Aortic Atherosclerosis (ICD10-I70.0).     PHYSICAL EXAMINATION: ECOG PERFORMANCE STATUS: 1 - Symptomatic but completely ambulatory  Vitals:   05/23/23 1047  BP: (!) 157/70  Pulse: 74  Resp: 18  Temp: 97.8 F (36.6 C)  SpO2: 99%   Filed Weights   05/23/23 1047  Weight: 102 lb 6.4 oz (46.4 kg)    GENERAL:alert, no distress and comfortable   LABORATORY DATA:  I have reviewed the data as listed    Component Value Date/Time   NA 136 08/30/2022 1202   NA 141 04/25/2017 1041   K 4.0 08/30/2022 1202   K 4.6 04/25/2017 1041   CL 105 08/30/2022 1202   CL 104 10/27/2012 1209   CO2 27 08/30/2022 1202   CO2 26 04/25/2017 1041   GLUCOSE 81 08/30/2022 1202   GLUCOSE 97 04/25/2017 1041   GLUCOSE 104 (H) 10/27/2012 1209   BUN 17 08/30/2022 1202   BUN 13.1 04/25/2017 1041   CREATININE 0.70 08/30/2022 1202   CREATININE 0.77 03/08/2020 1030   CREATININE 0.8 04/25/2017 1041   CALCIUM 9.1 08/30/2022 1202   CALCIUM 9.8 04/25/2017 1041   PROT 7.0 08/30/2022 1202   PROT 7.5 04/25/2017 1041   ALBUMIN 4.1 08/30/2022 1202   ALBUMIN 4.4 04/25/2017 1041   AST 24 08/30/2022 1202   AST 23 04/25/2017 1041   ALT 18 08/30/2022 1202   ALT 14 04/25/2017 1041   ALKPHOS 63 08/30/2022 1202   ALKPHOS 69 04/25/2017 1041   BILITOT 0.7 08/30/2022 1202   BILITOT 0.95 04/25/2017 1041   GFRNONAA >60 08/30/2022 1202   GFRAA >60 12/03/2019 1055    No results found for: "SPEP", "UPEP"  Lab  Results  Component Value Date   WBC 33.4 (H) 05/23/2023   NEUTROABS PENDING 05/23/2023   HGB 12.0 05/23/2023   HCT 34.8 (L) 05/23/2023   MCV 103.0 (H) 05/23/2023   PLT 86 (L) 05/23/2023      Chemistry      Component Value Date/Time   NA 136 08/30/2022 1202   NA 141 04/25/2017 1041   K 4.0 08/30/2022 1202   K 4.6 04/25/2017 1041   CL 105 08/30/2022 1202   CL 104 10/27/2012 1209   CO2 27 08/30/2022 1202   CO2 26 04/25/2017 1041   BUN 17 08/30/2022 1202   BUN 13.1 04/25/2017 1041   CREATININE 0.70 08/30/2022 1202   CREATININE 0.77 03/08/2020 1030   CREATININE 0.8 04/25/2017 1041      Component Value Date/Time   CALCIUM 9.1 08/30/2022 1202   CALCIUM 9.8  04/25/2017 1041   ALKPHOS 63 08/30/2022 1202   ALKPHOS 69 04/25/2017 1041   AST 24 08/30/2022 1202   AST 23 04/25/2017 1041   ALT 18 08/30/2022 1202   ALT 14 04/25/2017 1041   BILITOT 0.7 08/30/2022 1202   BILITOT 0.95 04/25/2017 1041

## 2023-05-23 NOTE — Assessment & Plan Note (Signed)
She has chronic thrombocytopenia for some time, could be related to CLL She is not symptomatic Observe for now

## 2023-05-23 NOTE — Assessment & Plan Note (Signed)
Overall, even though her leukocytosis is slightly worse compared to her prior visit Her platelet count is lower but comparable with last year's count She is not anemic Overall, she has slight progression of CLL but not at the point she needs treatment However, given progression of leukocytosis, I plan to see her back in 3 months instead of waiting for 6 months I will order additional workup for evaluation of thrombocytopenia She might need to be restarted back on treatment soon

## 2023-05-31 ENCOUNTER — Telehealth: Payer: Self-pay

## 2023-05-31 NOTE — Telephone Encounter (Signed)
[employer] approval extension. 07/31/23-07/29/24   Approval letter will be uploaded to media tab.

## 2023-06-06 ENCOUNTER — Other Ambulatory Visit (HOSPITAL_BASED_OUTPATIENT_CLINIC_OR_DEPARTMENT_OTHER): Payer: Self-pay

## 2023-06-06 MED ORDER — COMIRNATY 30 MCG/0.3ML IM SUSY
0.3000 mL | PREFILLED_SYRINGE | Freq: Once | INTRAMUSCULAR | 0 refills | Status: AC
  Filled 2023-06-06: qty 0.3, 1d supply, fill #0

## 2023-06-20 ENCOUNTER — Ambulatory Visit: Payer: Medicare PPO | Admitting: Internal Medicine

## 2023-07-03 DIAGNOSIS — R3914 Feeling of incomplete bladder emptying: Secondary | ICD-10-CM | POA: Diagnosis not present

## 2023-07-03 DIAGNOSIS — N3020 Other chronic cystitis without hematuria: Secondary | ICD-10-CM | POA: Diagnosis not present

## 2023-08-06 DIAGNOSIS — N3020 Other chronic cystitis without hematuria: Secondary | ICD-10-CM | POA: Diagnosis not present

## 2023-08-06 DIAGNOSIS — R8271 Bacteriuria: Secondary | ICD-10-CM | POA: Diagnosis not present

## 2023-08-23 ENCOUNTER — Inpatient Hospital Stay: Payer: Medicare PPO | Attending: Hematology and Oncology | Admitting: Hematology and Oncology

## 2023-08-23 ENCOUNTER — Inpatient Hospital Stay: Payer: Medicare PPO

## 2023-08-23 VITALS — BP 150/75 | HR 83 | Temp 97.9°F | Resp 18 | Ht <= 58 in | Wt 99.4 lb

## 2023-08-23 DIAGNOSIS — D696 Thrombocytopenia, unspecified: Secondary | ICD-10-CM

## 2023-08-23 DIAGNOSIS — C9110 Chronic lymphocytic leukemia of B-cell type not having achieved remission: Secondary | ICD-10-CM | POA: Insufficient documentation

## 2023-08-23 LAB — CBC WITH DIFFERENTIAL (CANCER CENTER ONLY)
Abs Immature Granulocytes: 0.05 10*3/uL (ref 0.00–0.07)
Basophils Absolute: 0.1 10*3/uL (ref 0.0–0.1)
Basophils Relative: 0 %
Eosinophils Absolute: 0.1 10*3/uL (ref 0.0–0.5)
Eosinophils Relative: 0 %
HCT: 40.3 % (ref 36.0–46.0)
Hemoglobin: 13.3 g/dL (ref 12.0–15.0)
Immature Granulocytes: 0 %
Lymphocytes Relative: 86 %
Lymphs Abs: 37.6 10*3/uL — ABNORMAL HIGH (ref 0.7–4.0)
MCH: 34.4 pg — ABNORMAL HIGH (ref 26.0–34.0)
MCHC: 33 g/dL (ref 30.0–36.0)
MCV: 104.1 fL — ABNORMAL HIGH (ref 80.0–100.0)
Monocytes Absolute: 3.6 10*3/uL — ABNORMAL HIGH (ref 0.1–1.0)
Monocytes Relative: 8 %
Neutro Abs: 2.4 10*3/uL (ref 1.7–7.7)
Neutrophils Relative %: 6 %
Platelet Count: 102 10*3/uL — ABNORMAL LOW (ref 150–400)
RBC: 3.87 MIL/uL (ref 3.87–5.11)
RDW: 13.5 % (ref 11.5–15.5)
Smear Review: NORMAL
WBC Count: 43.9 10*3/uL — ABNORMAL HIGH (ref 4.0–10.5)
nRBC: 0 % (ref 0.0–0.2)

## 2023-08-23 LAB — CMP (CANCER CENTER ONLY)
ALT: 17 U/L (ref 0–44)
AST: 22 U/L (ref 15–41)
Albumin: 4.5 g/dL (ref 3.5–5.0)
Alkaline Phosphatase: 61 U/L (ref 38–126)
Anion gap: 6 (ref 5–15)
BUN: 20 mg/dL (ref 8–23)
CO2: 27 mmol/L (ref 22–32)
Calcium: 9.6 mg/dL (ref 8.9–10.3)
Chloride: 106 mmol/L (ref 98–111)
Creatinine: 0.73 mg/dL (ref 0.44–1.00)
GFR, Estimated: >60 mL/min (ref >60)
Glucose, Bld: 95 mg/dL (ref 70–99)
Potassium: 4 mmol/L (ref 3.5–5.1)
Sodium: 139 mmol/L (ref 135–145)
Total Bilirubin: 0.9 mg/dL (ref 0.0–1.2)
Total Protein: 7.3 g/dL (ref 6.5–8.1)

## 2023-08-23 LAB — LACTATE DEHYDROGENASE: LDH: 152 U/L (ref 98–192)

## 2023-08-23 LAB — HEPATITIS B SURFACE ANTIGEN: Hepatitis B Surface Ag: NONREACTIVE

## 2023-08-23 LAB — HEPATITIS B SURFACE ANTIBODY,QUALITATIVE: Hep B S Ab: NONREACTIVE

## 2023-08-23 LAB — VITAMIN B12: Vitamin B-12: 271 pg/mL (ref 180–914)

## 2023-08-23 LAB — HEPATITIS B CORE ANTIBODY, IGM: Hep B C IgM: NONREACTIVE

## 2023-08-25 LAB — IGG, IGA, IGM
IgA: 120 mg/dL (ref 64–422)
IgG (Immunoglobin G), Serum: 1267 mg/dL (ref 586–1602)
IgM (Immunoglobulin M), Srm: 16 mg/dL — ABNORMAL LOW (ref 26–217)

## 2023-08-26 ENCOUNTER — Encounter: Payer: Self-pay | Admitting: Hematology and Oncology

## 2023-08-26 NOTE — Assessment & Plan Note (Signed)
Overall, even though her leukocytosis is slightly worse compared to her prior visit, she is not symptomatic Her platelet count is low intermittently She is not anemic Overall, she has slight progression of CLL but not at the point she needs treatment I plan to see her back in 4 months

## 2023-08-26 NOTE — Progress Notes (Signed)
[facility] OFFICE PROGRESS NOTE  Patient Care Team: Philip Aspen, Limmie Patricia, MD as PCP - General (Internal Medicine) Anselm Lis, NP (Inactive) as Nurse Practitioner (Hospice and Palliative Medicine)  ASSESSMENT & PLAN:  CLL (chronic lymphocytic leukemia) (HCC) Overall, even though her leukocytosis is slightly worse compared to her prior visit, she is not symptomatic Her platelet count is low intermittently She is not anemic Overall, she has slight progression of CLL but not at the point she needs treatment I plan to see her back in 4 months  Orders Placed This Encounter  Procedures   CBC with Differential/Platelet    Standing Status:   Standing    Number of Occurrences:   22    Expiration Date:   08/25/2024    All questions were answered. The patient knows to call the clinic with any problems, questions or concerns. The total time spent in the appointment was 20 minutes encounter with patients including review of chart and various tests results, discussions about plan of care and coordination of care plan   Artis Delay, MD 08/26/2023 8:09 AM  INTERVAL HISTORY: Please see below for problem oriented charting. she returns for surveillance follow-up with her husband She has frequent UTI but no URI The patient denies any recent signs or symptoms of bleeding such as spontaneous epistaxis, hematuria or hematochezia. We discussed cbc result and plan for follow-up  REVIEW OF SYSTEMS:   Constitutional: Denies fevers, chills or abnormal weight loss Eyes: Denies blurriness of vision Ears, nose, mouth, throat, and face: Denies mucositis or sore throat Respiratory: Denies cough, dyspnea or wheezes Cardiovascular: Denies palpitation, chest discomfort or lower extremity swelling Gastrointestinal:  Denies nausea, heartburn or change in bowel habits Skin: Denies abnormal skin rashes Lymphatics: Denies new lymphadenopathy or easy bruising Neurological:Denies numbness,  tingling or new weaknesses Behavioral/Psych: Mood is stable, no new changes  All other systems were reviewed with the patient and are negative.  I have reviewed the past medical history, past surgical history, social history and family history with the patient and they are unchanged from previous note.  ALLERGIES:  is allergic to codeine, doxycycline, and levofloxacin.  MEDICATIONS:  Current Outpatient Medications  Medication Sig Dispense Refill   loperamide (IMODIUM) 2 MG capsule Take 1 capsule (2 mg total) by mouth 4 (four) times daily as needed for diarrhea or loose stools. 8 capsule 0   Multiple Vitamins-Minerals (MULTIVITAMIN WITH MINERALS) tablet Take 1 tablet by mouth daily.     traMADol (ULTRAM) 50 MG tablet TAKE 1 TABLET BY MOUTH EVERY 12 HOURS AS NEEDED 30 tablet 2   No current facility-administered medications for this visit.    SUMMARY OF ONCOLOGIC HISTORY: Oncology History Overview Note  Del 13 q   CLL (chronic lymphocytic leukemia) (HCC)  03/15/2009 Pathology Results   Case #: [id number]  flow cytometry of peripheral blood comfirmed CLL.   04/25/2017 Pathology Results   FISH panel is positive for deletion 13 q only   04/22/2019 Imaging   Ct scan of the chest, abdomen and pelvis 1. No adenopathy within the chest, abdomen or pelvis. 2. Splenomegaly with scattered low-density lesions measuring up to 1.3 cm. 3. Small subpleural nodule in the left lower lobe and right middle lobe measuring up to 5 mm. No follow-up needed if patient is low-risk (and has no known or suspected primary neoplasm). Non-contrast chest CT can be considered in 12 months if patient is high-risk. This recommendation follows the consensus statement: Guidelines for Management of  Incidental Pulmonary Nodules Detected on CT Images: From the Fleischner Society 2017; Radiology 2017; 284:228-243. 4. Aortic Atherosclerosis (ICD10-I70.0). Coronary artery atherosclerotic calcifications.     04/23/2019 Cancer  Staging   Staging form: Chronic Lymphocytic Leukemia / Small Lymphocytic Lymphoma, AJCC 8th Edition - Clinical stage from 04/23/2019: Modified Rai Stage IV (Modified Rai risk: High, Lymphocytosis: Present, Adenopathy: Absent, Organomegaly: Present, Anemia: Absent, Thrombocytopenia: Present) - Signed by Artis Delay, MD on 04/23/2019   04/28/2019 -  Chemotherapy   The patient had Ibrutinib for chemotherapy treatment.     06/01/2019 -  Chemotherapy   The patient had Bendamustine for chemotherapy   06/02/2019 Imaging   Ct head, chest, abdomen and pelvis 1. New T8 compression deformity with approximately 30% height loss. Worsening anterior compression deformity of the T11 vertebrae now with 20% height loss anteriorly. Additional T12 and L2 compression deformities are stable from prior. 2. No other acute traumatic injury within the chest, abdomen, or pelvis. Specifically, no splenic injury or retroperitoneal hematoma. 3. Mild intra and extrahepatic biliary ductal dilatation with a small calcified gallstone seen in the distal common bile duct just proximal to the ampulla of Vater. Correlate with LFTs and consider further evaluation with MRCP as clinically indicated. 4. Stable sub 5 mm nodules in the right middle and left lower lobe. No further evaluation is required if patient is low risk however should continue with previously recommended 12 month follow-up in September of 2021 if patient is high risk. 5. Severe scoliotic curvature of the thoracolumbar spine with associated chest wall deformity. 6. Duodenal and colonic diverticula without features of diverticulitis. 7. Moderate bladder distention, correlate for features of outlet obstruction or retention. 8. Aortic Atherosclerosis (ICD10-I70.0).   06/07/2019 - 06/10/2019 Hospital Admission   She was admitted for management of LUQ and back pain   06/08/2019 Imaging   MRI abdomen 1. Mild intra and extrahepatic biliary duct prominence with common bile  duct measuring upper normal for patient age. No gallstones evident and no definite choledocholithiasis. Somewhat abrupt transition of the duct into the ampulla noted, but nonspecific. ERCP could be used to further evaluate for ampullary lesion as clinically warranted. 2. Hepatic and renal cysts. 3. Presumed marked distention of the urinary bladder.   06/08/2019 Imaging   Ct angiogram No gross evidence of large or central pulmonary emboli is identified on exam limited secondary to suboptimal pulmonary arterial opacification and respiratory motion.   Bibasilar atelectasis.   Osseous demineralization with compression fractures of several thoracic vertebra.   Aneurysmal dilatation ascending thoracic aorta 4.1 cm transverse, recommendation below.   Recommend annual imaging followup by CTA or MRA.   Aortic Atherosclerosis (ICD10-I70.0).   Aortic aneurysm NOS   10/09/2019 Imaging   1. No definite acute CT findings of the abdomen or pelvis to explain pain. 2. Very distended urinary bladder.  Correlate for urinary retention. 3. Large burden of stool in the distal colon and rectum. Correlate for constipation. 4. Unchanged mild intrahepatic biliary ductal dilatation. Common bile duct normal in caliber measuring up to 6 mm. No obstructing lesion or calculi identified. 5. No mass or lymphadenopathy in the abdomen or pelvis. 6. Aortic Atherosclerosis (ICD10-I70.0).     PHYSICAL EXAMINATION: ECOG PERFORMANCE STATUS: 0 - Asymptomatic  Vitals:   08/23/23 1341  BP: (!) 150/75  Pulse: 83  Resp: 18  Temp: 97.9 F (36.6 C)  SpO2: 100%   Filed Weights   08/23/23 1341  Weight: 99 lb 6.4 oz (45.1 kg)    GENERAL:alert, no  distress and comfortable   LABORATORY DATA:  I have reviewed the data as listed    Component Value Date/Time   NA 139 08/23/2023 1305   NA 141 04/25/2017 1041   K 4.0 08/23/2023 1305   K 4.6 04/25/2017 1041   CL 106 08/23/2023 1305   CL 104 10/27/2012 1209   CO2 27  08/23/2023 1305   CO2 26 04/25/2017 1041   GLUCOSE 95 08/23/2023 1305   GLUCOSE 97 04/25/2017 1041   GLUCOSE 104 (H) 10/27/2012 1209   BUN 20 08/23/2023 1305   BUN 13.1 04/25/2017 1041   CREATININE 0.73 08/23/2023 1305   CREATININE 0.77 03/08/2020 1030   CREATININE 0.8 04/25/2017 1041   CALCIUM 9.6 08/23/2023 1305   CALCIUM 9.8 04/25/2017 1041   PROT 7.3 08/23/2023 1305   PROT 7.5 04/25/2017 1041   ALBUMIN 4.5 08/23/2023 1305   ALBUMIN 4.4 04/25/2017 1041   AST 22 08/23/2023 1305   AST 23 04/25/2017 1041   ALT 17 08/23/2023 1305   ALT 14 04/25/2017 1041   ALKPHOS 61 08/23/2023 1305   ALKPHOS 69 04/25/2017 1041   BILITOT 0.9 08/23/2023 1305   BILITOT 0.95 04/25/2017 1041   GFRNONAA >60 08/23/2023 1305   GFRAA >60 12/03/2019 1055    No results found for: "SPEP", "UPEP"  Lab Results  Component Value Date   WBC 43.9 (H) 08/23/2023   NEUTROABS 2.4 08/23/2023   HGB 13.3 08/23/2023   HCT 40.3 08/23/2023   MCV 104.1 (H) 08/23/2023   PLT 102 (L) 08/23/2023      Chemistry      Component Value Date/Time   NA 139 08/23/2023 1305   NA 141 04/25/2017 1041   K 4.0 08/23/2023 1305   K 4.6 04/25/2017 1041   CL 106 08/23/2023 1305   CL 104 10/27/2012 1209   CO2 27 08/23/2023 1305   CO2 26 04/25/2017 1041   BUN 20 08/23/2023 1305   BUN 13.1 04/25/2017 1041   CREATININE 0.73 08/23/2023 1305   CREATININE 0.77 03/08/2020 1030   CREATININE 0.8 04/25/2017 1041      Component Value Date/Time   CALCIUM 9.6 08/23/2023 1305   CALCIUM 9.8 04/25/2017 1041   ALKPHOS 61 08/23/2023 1305   ALKPHOS 69 04/25/2017 1041   AST 22 08/23/2023 1305   AST 23 04/25/2017 1041   ALT 17 08/23/2023 1305   ALT 14 04/25/2017 1041   BILITOT 0.9 08/23/2023 1305   BILITOT 0.95 04/25/2017 1041

## 2023-08-29 ENCOUNTER — Ambulatory Visit: Payer: Medicare PPO | Admitting: Nurse Practitioner

## 2023-08-29 VITALS — BP 132/68 | HR 85 | Wt 100.6 lb

## 2023-08-29 DIAGNOSIS — R300 Dysuria: Secondary | ICD-10-CM

## 2023-08-29 DIAGNOSIS — N3020 Other chronic cystitis without hematuria: Secondary | ICD-10-CM

## 2023-08-29 DIAGNOSIS — N3000 Acute cystitis without hematuria: Secondary | ICD-10-CM | POA: Diagnosis not present

## 2023-08-29 DIAGNOSIS — N898 Other specified noninflammatory disorders of vagina: Secondary | ICD-10-CM

## 2023-08-29 LAB — WET PREP FOR TRICH, YEAST, CLUE

## 2023-08-29 MED ORDER — SULFAMETHOXAZOLE-TRIMETHOPRIM 800-160 MG PO TABS: 1.0000 | ORAL_TABLET | Freq: Two times a day (BID) | ORAL | 0 refills | Status: AC

## 2023-08-29 NOTE — Progress Notes (Signed)
   Acute Office Visit  Subjective:    Patient ID: April [name], female    DOB: 01/24/1935, 89 y.o.   MRN: [id number]   HPI 89 y.o. presents today for vaginal discharge, itching and odor. Used 1-day monistat 5 days ago. Thinks it helped. Also having some burning with urination, back pain and pelvic pain. No fevers or chills. Sees Dr. Arita Miss at [facility] for chronic cystis. Reports being treated for UTI in early December and thinks she was prescribed Cipro.   Patient's last menstrual period was 03/26/1979.    Review of Systems  Constitutional: Negative.   Genitourinary:  Positive for dysuria, pelvic pain, vaginal discharge and vaginal pain (Itching). Negative for difficulty urinating, flank pain, frequency, hematuria, urgency and vaginal bleeding.  Musculoskeletal:  Positive for back pain.       Objective:    Physical Exam Constitutional:      Appearance: Normal appearance.  Abdominal:     Tenderness: There is no right CVA tenderness or left CVA tenderness.  Genitourinary:    General: Normal vulva.     Vagina: Normal.     Comments: Atrophic changes    BP 132/68 (BP Location: Right Arm, Patient Position: Sitting, Cuff Size: Normal)   Pulse 85   Wt 100 lb 9.6 oz (45.6 kg)   LMP 03/26/1979   SpO2 97%   BMI 21.03 kg/m  Wt Readings from Last 3 Encounters:  08/29/23 100 lb 9.6 oz (45.6 kg)  08/23/23 99 lb 6.4 oz (45.1 kg)  05/23/23 102 lb 6.4 oz (46.4 kg)        Patient informed chaperone available to be present for breast and/or pelvic exam. Patient has requested no chaperone to be present. Patient has been advised what will be completed during breast and pelvic exam.   Wet prep negative for pathogens  UA trace leukocytes, + nitrites, trace blood, yellow/cloudy. Microscopic: wbc 0-5, rbc none, many bacteria  Assessment & Plan:   Problem List Items Addressed This Visit       Other   Dysuria   Relevant Orders   Urinalysis,Complete w/RFL Culture (Completed)    Other Visit Diagnoses       Acute cystitis without hematuria    -  Primary   Relevant Medications   sulfamethoxazole-trimethoprim (BACTRIM DS) 800-160 MG tablet     Chronic cystitis without hematuria       Relevant Orders   Urinalysis,Complete w/RFL Culture (Completed)     Vaginal discharge       Relevant Orders   WET PREP FOR TRICH, YEAST, CLUE      Plan: Wet prep negative. Nitrites in urinalysis. Will treat for UTI - Bactrim 800 mg BID x 3 days. Culture pending. Declines Pyridium.   Return if symptoms worsen or fail to improve.    Olivia Mackie DNP, 12:15 PM 08/29/2023

## 2023-09-01 LAB — URINALYSIS, COMPLETE W/RFL CULTURE
Bilirubin Urine: NEGATIVE
Casts: NONE SEEN /[LPF]
Crystals: NONE SEEN /[HPF]
Glucose, UA: NEGATIVE
Ketones, ur: NEGATIVE
Nitrites, Initial: POSITIVE — AB
Protein, ur: NEGATIVE
RBC / HPF: NONE SEEN /[HPF] (ref 0–2)
Specific Gravity, Urine: 1.02 (ref 1.001–1.035)
Yeast: NONE SEEN /[HPF]
pH: 5.5 (ref 5.0–8.0)

## 2023-09-01 LAB — URINE CULTURE
MICRO NUMBER:: 16019688
SPECIMEN QUALITY:: ADEQUATE

## 2023-09-01 LAB — CULTURE INDICATED

## 2023-09-02 ENCOUNTER — Observation Stay (HOSPITAL_COMMUNITY)
Admission: EM | Admit: 2023-09-02 | Discharge: 2023-09-04 | Disposition: A | Discharge status: Home / Self Care | Payer: Medicare PPO | Attending: Internal Medicine | Admitting: Internal Medicine

## 2023-09-02 ENCOUNTER — Emergency Department (HOSPITAL_COMMUNITY): Payer: Medicare PPO

## 2023-09-02 ENCOUNTER — Encounter (HOSPITAL_COMMUNITY): Payer: Self-pay

## 2023-09-02 ENCOUNTER — Other Ambulatory Visit: Payer: Self-pay

## 2023-09-02 ENCOUNTER — Encounter: Payer: Self-pay | Admitting: Nurse Practitioner

## 2023-09-02 ENCOUNTER — Ambulatory Visit: Payer: Self-pay | Admitting: Internal Medicine

## 2023-09-02 DIAGNOSIS — R0902 Hypoxemia: Secondary | ICD-10-CM | POA: Diagnosis not present

## 2023-09-02 DIAGNOSIS — Z20822 Contact with and (suspected) exposure to covid-19: Secondary | ICD-10-CM | POA: Diagnosis not present

## 2023-09-02 DIAGNOSIS — I10 Essential (primary) hypertension: Secondary | ICD-10-CM | POA: Insufficient documentation

## 2023-09-02 DIAGNOSIS — D696 Thrombocytopenia, unspecified: Secondary | ICD-10-CM | POA: Diagnosis present

## 2023-09-02 DIAGNOSIS — R059 Cough, unspecified: Secondary | ICD-10-CM | POA: Diagnosis not present

## 2023-09-02 DIAGNOSIS — R0602 Shortness of breath: Secondary | ICD-10-CM | POA: Diagnosis not present

## 2023-09-02 DIAGNOSIS — C9110 Chronic lymphocytic leukemia of B-cell type not having achieved remission: Secondary | ICD-10-CM | POA: Diagnosis present

## 2023-09-02 DIAGNOSIS — R0989 Other specified symptoms and signs involving the circulatory and respiratory systems: Secondary | ICD-10-CM | POA: Diagnosis not present

## 2023-09-02 DIAGNOSIS — Z853 Personal history of malignant neoplasm of breast: Secondary | ICD-10-CM | POA: Diagnosis not present

## 2023-09-02 DIAGNOSIS — Z87891 Personal history of nicotine dependence: Secondary | ICD-10-CM | POA: Insufficient documentation

## 2023-09-02 DIAGNOSIS — J101 Influenza due to other identified influenza virus with other respiratory manifestations: Principal | ICD-10-CM | POA: Diagnosis present

## 2023-09-02 LAB — BRAIN NATRIURETIC PEPTIDE: B Natriuretic Peptide: 51.2 pg/mL (ref 0.0–100.0)

## 2023-09-02 LAB — CBC
HCT: 39.5 % (ref 36.0–46.0)
Hemoglobin: 12.8 g/dL (ref 12.0–15.0)
MCH: 34.4 pg — ABNORMAL HIGH (ref 26.0–34.0)
MCHC: 32.4 g/dL (ref 30.0–36.0)
MCV: 106.2 fL — ABNORMAL HIGH (ref 80.0–100.0)
Platelets: 79 10*3/uL — ABNORMAL LOW (ref 150–400)
RBC: 3.72 MIL/uL — ABNORMAL LOW (ref 3.87–5.11)
RDW: 13.8 % (ref 11.5–15.5)
WBC: 30.5 10*3/uL — ABNORMAL HIGH (ref 4.0–10.5)
nRBC: 0 % (ref 0.0–0.2)

## 2023-09-02 LAB — BASIC METABOLIC PANEL
Anion gap: 7 (ref 5–15)
BUN: 18 mg/dL (ref 8–23)
CO2: 22 mmol/L (ref 22–32)
Calcium: 8.7 mg/dL — ABNORMAL LOW (ref 8.9–10.3)
Chloride: 104 mmol/L (ref 98–111)
Creatinine, Ser: 0.74 mg/dL (ref 0.44–1.00)
GFR, Estimated: >60 mL/min (ref >60)
Glucose, Bld: 110 mg/dL — ABNORMAL HIGH (ref 70–99)
Potassium: 4.7 mmol/L (ref 3.5–5.1)
Sodium: 133 mmol/L — ABNORMAL LOW (ref 135–145)

## 2023-09-02 LAB — RESP PANEL BY RT-PCR (RSV, FLU A&B, COVID)  RVPGX2
Influenza A by PCR: POSITIVE — AB
Influenza B by PCR: NEGATIVE
Resp Syncytial Virus by PCR: NEGATIVE
SARS Coronavirus 2 by RT PCR: NEGATIVE

## 2023-09-02 MED ORDER — OSELTAMIVIR PHOSPHATE 75 MG PO CAPS
75.0000 mg | ORAL_CAPSULE | Freq: Once | ORAL | Status: AC
Start: 2023-09-02 — End: 2023-09-02
  Administered 2023-09-02: 75 mg via ORAL
  Filled 2023-09-02: qty 1

## 2023-09-02 MED ORDER — ENOXAPARIN SODIUM 40 MG/0.4ML IJ SOSY: 40.0000 mg | PREFILLED_SYRINGE | INTRAMUSCULAR | Status: DC

## 2023-09-02 MED ORDER — ACETAMINOPHEN 325 MG PO TABS: 650.0000 mg | ORAL_TABLET | Freq: Four times a day (QID) | ORAL | Status: DC | PRN

## 2023-09-02 MED ORDER — HEPARIN SODIUM (PORCINE) 5000 UNIT/ML IJ SOLN
5000.0000 [IU] | Freq: Three times a day (TID) | INTRAMUSCULAR | Status: DC
  Administered 2023-09-02 – 2023-09-04 (×5): 5000 [IU] via SUBCUTANEOUS
  Filled 2023-09-02 (×5): qty 1

## 2023-09-02 MED ORDER — OSELTAMIVIR PHOSPHATE 30 MG PO CAPS
30.0000 mg | ORAL_CAPSULE | Freq: Two times a day (BID) | ORAL | Status: DC
  Administered 2023-09-03 – 2023-09-04 (×3): 30 mg via ORAL
  Filled 2023-09-02 (×3): qty 1

## 2023-09-02 MED ORDER — ACETAMINOPHEN 650 MG RE SUPP: 650.0000 mg | Freq: Four times a day (QID) | RECTAL | Status: DC | PRN

## 2023-09-02 MED ORDER — ALBUTEROL SULFATE (2.5 MG/3ML) 0.083% IN NEBU: 2.5000 mg | INHALATION_SOLUTION | RESPIRATORY_TRACT | Status: DC | PRN

## 2023-09-02 MED ORDER — TRAZODONE HCL 50 MG PO TABS
25.0000 mg | ORAL_TABLET | Freq: Every evening | ORAL | Status: DC | PRN
  Administered 2023-09-03: 25 mg via ORAL
  Filled 2023-09-02: qty 1

## 2023-09-02 MED ORDER — ONDANSETRON HCL 4 MG/2ML IJ SOLN
4.0000 mg | Freq: Once | INTRAMUSCULAR | Status: AC
  Administered 2023-09-02: 4 mg via INTRAVENOUS
  Filled 2023-09-02: qty 2

## 2023-09-02 NOTE — Telephone Encounter (Signed)
  Chief Complaint: SOB Symptoms: SOB, cough, sore throat,  Frequency: 4 days  Disposition: [x] ED /[] Urgent Care (no appt availability in office) / [] Appointment(In office/virtual)/ []  [employer] [facility]/ [] Home Care/ [] Refused Recommended Disposition /[] [facility]/ []  Follow-up with PCP  Additional Notes: Pt states that she would like to be swabbed for Covid/flu, states that she has SOB, cough and sore throat. Pt states that this time the SOB seems to be constant but that it doesn't interfere with her ADLs. Pt speaking in broken sentences and confirms this is d/t her SOB. Pt advised to go to ED, pt agreeable.   Copied from CRM [id number]. Topic: Clinical - Red Word Triage >> Sep 02, 2023  8:37 AM Sim Boast F wrote: Red Word that prompted transfer to Nurse Triage: Patient is having trouble breathing - cough, sore throat, doesn't know if it's a cold, covid or the flu Reason for Disposition . [1] MODERATE difficulty breathing (e.g., speaks in phrases, SOB even at rest, pulse 100-120) AND [2] NEW-onset or WORSE than normal  Answer Assessment - Initial Assessment Questions 1. RESPIRATORY STATUS: "Describe your breathing?" (e.g., wheezing, shortness of breath, unable to speak, severe coughing)  SOB 2. ONSET: "When did this breathing problem begin?"      3 days ago 4. SEVERITY: "How bad is your breathing?" (e.g., mild, moderate, severe)    - MILD: No SOB at rest, mild SOB with walking, speaks normally in sentences, can lie down, no retractions, pulse < 100.    - MODERATE: SOB at rest, SOB with minimal exertion and prefers to sit, cannot lie down flat, speaks in phrases, mild retractions, audible wheezing, pulse 100-120.    - SEVERE: Very SOB at rest, speaks in single words, struggling to breathe, sitting hunched forward, retractions, pulse > 120      Pt speaking in broken sentences d/t SOB  Protocols used: Breathing Difficulty-A-AH

## 2023-09-02 NOTE — ED Triage Notes (Signed)
Patient reports cough, shortness of breath, sore throat and sinus pressure. Patient was recently on bactrim for UTI.

## 2023-09-02 NOTE — H&P (Signed)
History and Physical  April [name] ZOX:[id number] DOB: 25-Feb-1935 DOA: 09/02/2023  PCP: April [name], April Patricia, MD   Chief Complaint: Cough, shortness of breath  HPI: April [name] is a 89 y.o. female with medical history significant for CLL followed by Dr. Bertis [name] currently on surveillance being admitted to the hospital with intermittent hypoxia in the setting of influenza A infection.  She tells me she is pretty healthy, her husband is at the bedside.  She was recently treated by her PCP for UTI, and completed her course of antibiotics yesterday.  Her symptoms have resolved.  Starting on 1/31, she started having some bodyaches, cough and congestion.  Denies any chest pain or fevers.  She was evaluated in the emergency department, lab work and imaging relatively unremarkable.  She tested positive for influenza A.  She has been having some coughing spells in the emergency department, causing her O2 saturation to drop below 90%.  She was therefore referred to the hospitalist service for observation.  Review of Systems: Please see HPI for pertinent positives and negatives. A complete 10 system review of systems are otherwise negative.  Past Medical History:  Diagnosis Date   Breast CA (HCC)    Bronchitis, chronic (HCC)    Compression fracture of body of thoracic vertebra (HCC)    Eye hemorrhage, left    Leukemia, chronic lymphoid (HCC)    Lymphomatoid papulosis    INCREASED RISK FOR LYMPHOMA   Osteoporosis 12/19/2021   severe   Past Surgical History:  Procedure Laterality Date   ABDOMINAL HYSTERECTOMY  1980   BREAST IMPLANTS REMOVED  2001   BREAST SURGERY     Bilateral mastectomy   IR RADIOLOGIST EVAL & MGMT  07/01/2019   MASTECTOMY  1982   BILATERAL   OOPHORECTOMY     BSO   PARTIAL COLECTOMY     INTESTINAL ABSCESS WITH SALPINGECTOMY   RECONSTRUCTION LEFT ELBOW     TONSILLECTOMY     UMBILLICAL HERNIA REPAIR     Social History:  reports that she quit smoking about 68 years  ago. Her smoking use included cigarettes. She has never used smokeless tobacco. She reports that she does not currently use alcohol. She reports that she does not use drugs.  Allergies  Allergen Reactions   Codeine Nausea Only   Doxycycline Dermatitis   Levofloxacin Rash    Family History  Problem Relation Age of Onset   Heart disease Mother    Heart disease Sister    Cancer Brother        HODGKINS   Cancer Brother        leukemia   Parkinson's disease Brother      Prior to Admission medications   Medication Sig Start Date End Date Taking? Authorizing Provider  clobetasol cream (TEMOVATE) 0.05 % SMARTSIG:sparingly Topical Twice Daily 04/11/23   [provider]  fluconazole (DIFLUCAN) 200 MG tablet Take 200 mg by mouth once a week. 08/06/23   [provider]  Ibuprofen 200 MG CAPS Take by mouth.    [provider]  loperamide (IMODIUM) 2 MG capsule Take 1 capsule (2 mg total) by mouth 4 (four) times daily as needed for diarrhea or loose stools. Patient not taking: Reported on 08/29/2023 08/31/20   April Cockayne, MD  Multiple Vitamins-Minerals (MULTIVITAMIN WITH MINERALS) tablet Take 1 tablet by mouth daily.    [provider]  traMADol (ULTRAM) 50 MG tablet TAKE 1 TABLET BY MOUTH EVERY 12 HOURS AS NEEDED Patient  not taking: Reported on 08/29/2023 02/21/23   April [name], April Patricia, MD    Physical Exam: BP (!) 157/85   Pulse 100   Temp 98.3 F (36.8 C) (Oral)   Resp 18   Ht 4\' 10"  (1.473 m)   Wt 44.5 kg   LMP 03/26/1979   SpO2 98%   BMI 20.48 kg/m  General:  Alert, oriented, calm, in no acute distress, her husband is at the bedside, she is wearing 2 L nasal cannula oxygen Cardiovascular: RRR, no murmurs or rubs, no peripheral edema  Respiratory: clear to auscultation bilaterally, no wheezes, no crackles  Abdomen: soft, nontender, nondistended, normal bowel tones heard  Skin: dry, no rashes  Musculoskeletal: no joint effusions, normal  range of motion  Psychiatric: appropriate affect, normal speech  Neurologic: extraocular muscles intact, clear speech, moving all extremities with intact sensorium         Labs on Admission:  Basic Metabolic Panel: Recent Labs  Lab 09/02/23 1208  NA 133*  K 4.7  CL 104  CO2 22  GLUCOSE 110*  BUN 18  CREATININE 0.74  CALCIUM 8.7*   Liver Function Tests: No results for input(s): "AST", "ALT", "ALKPHOS", "BILITOT", "PROT", "ALBUMIN" in the last 168 hours. No results for input(s): "LIPASE", "AMYLASE" in the last 168 hours. No results for input(s): "AMMONIA" in the last 168 hours. CBC: Recent Labs  Lab 09/02/23 1208  WBC 30.5*  HGB 12.8  HCT 39.5  MCV 106.2*  PLT 79*   Cardiac Enzymes: No results for input(s): "CKTOTAL", "CKMB", "CKMBINDEX", "TROPONINI" in the last 168 hours. BNP (last 3 results) Recent Labs    09/02/23 1208  BNP 51.2    ProBNP (last 3 results) No results for input(s): "PROBNP" in the last 8760 hours.  CBG: No results for input(s): "GLUCAP" in the last 168 hours.  Radiological Exams on Admission: DG Chest 2 View Result Date: 09/02/2023 CLINICAL DATA:  Cough.  Congestion.  Shortness of breath. EXAM: CHEST - 2 VIEW COMPARISON:  05/19/2022. FINDINGS: Bilateral lung fields are clear. No pulmonary edema. No dense consolidation or lung collapse. Bilateral costophrenic angles are clear. Normal cardio-mediastinal silhouette. No acute osseous abnormalities. The soft tissues are within normal limits. IMPRESSION: No active cardiopulmonary disease. Electronically Signed   By: April [name] M.D.   On: 09/02/2023 12:54   Assessment/Plan April [name] is a 89 y.o. female with medical history significant for CLL followed by Dr. Bertis [name] currently on surveillance being admitted to the hospital with intermittent hypoxia in the setting of influenza A infection.  Intermittent hypoxia-in the setting of influenza A.  Supplemental oxygen, wean for goal O2 saturation  greater than 90%.  Influenza A-symptoms started 3 days ago, no evidence of sepsis -Tamiflu x 5 days  CLL-stable, outpatient follow-up  Recent UTI-resolved after course of Bactrim  DVT prophylaxis: Lovenox     Code Status: Limited: Do not attempt resuscitation (DNR) -DNR-LIMITED -Do Not Intubate/DNI  -Confirmed with the patient in the presence of her husband, at the time of admission  Consults called: None  Admission status: Observation  Time spent: 48 minutes  Charlcie Prisco Sharlette Dense MD [facility] Pager [phone]  If 7PM-7AM, please contact night-coverage [email or web address] Password [facility]  09/02/2023, 3:37 PM

## 2023-09-02 NOTE — ED Notes (Signed)
ED TO INPATIENT HANDOFF REPORT  ED Nurse Name and Phone #: Johnney Killian Name/Age/Gender April [name] 89 y.o. female Room/Bed: WA23/WA23  Code Status   Code Status: Limited: Do not attempt resuscitation (DNR) -DNR-LIMITED -Do Not Intubate/DNI   Home/SNF/Other Home Patient oriented to: self, place, time, and situation Is this baseline? Yes   Triage Complete: Triage complete  Chief Complaint Influenza A [J10.1]  Triage Note Patient reports cough, shortness of breath, sore throat and sinus pressure. Patient was recently on bactrim for UTI.   Allergies Allergies  Allergen Reactions   Codeine Nausea Only   Doxycycline Dermatitis   Levofloxacin Rash    Level of Care/Admitting Diagnosis ED Disposition     ED Disposition  Admit   Condition  --   Comment  Hospital Area: [facility] [[id number]]  Level of Care: Med-Surg [16]  May place patient in observation at [facility] or [facility] if equivalent level of care is available:: Yes  Covid Evaluation: Confirmed COVID Negative  Diagnosis: Influenza A [010272]  Admitting Physician: Maryln Gottron [[id number]]  Attending Physician: Kirby Crigler, MIR [email or web address] [[id number]]          B Medical/Surgery History Past Medical History:  Diagnosis Date   Breast CA (HCC)    Bronchitis, chronic (HCC)    Compression fracture of body of thoracic vertebra (HCC)    Eye hemorrhage, left    Leukemia, chronic lymphoid (HCC)    Lymphomatoid papulosis    INCREASED RISK FOR LYMPHOMA   Osteoporosis 12/19/2021   severe   Past Surgical History:  Procedure Laterality Date   ABDOMINAL HYSTERECTOMY  1980   BREAST IMPLANTS REMOVED  2001   BREAST SURGERY     Bilateral mastectomy   IR RADIOLOGIST EVAL & MGMT  07/01/2019   MASTECTOMY  1982   BILATERAL   OOPHORECTOMY     BSO   PARTIAL COLECTOMY     INTESTINAL ABSCESS WITH SALPINGECTOMY   RECONSTRUCTION LEFT ELBOW     TONSILLECTOMY     UMBILLICAL HERNIA REPAIR       A IV  Location/Drains/Wounds Patient Lines/Drains/Airways Status     Active Line/Drains/Airways     Name Placement date Placement time Site Days   Peripheral IV 09/02/23 20 G Right Forearm 09/02/23  1206  Forearm  less than 1            Intake/Output Last 24 hours No intake or output data in the 24 hours ending 09/02/23 1642  Labs/Imaging Results for orders placed or performed during the hospital encounter of 09/02/23 (from the past 48 hours)  CBC     Status: Abnormal   Collection Time: 09/02/23 12:08 PM  Result Value Ref Range   WBC 30.5 (H) 4.0 - 10.5 K/uL   RBC 3.72 (L) 3.87 - 5.11 MIL/uL   Hemoglobin 12.8 12.0 - 15.0 g/dL   HCT 56.3 87.5 - 64.3 %   MCV 106.2 (H) 80.0 - 100.0 fL   MCH 34.4 (H) 26.0 - 34.0 pg   MCHC 32.4 30.0 - 36.0 g/dL   RDW 32.9 51.8 - 84.1 %   Platelets 79 (L) 150 - 400 K/uL    Comment: SPECIMEN CHECKED FOR CLOTS Immature Platelet Fraction may be clinically indicated, consider ordering this additional test [id number] REPEATED TO VERIFY PLATELET COUNT CONFIRMED BY SMEAR    nRBC 0.0 0.0 - 0.2 %    Comment: Performed at [facility], [address], [place], [place] [address]  Basic metabolic panel     Status: Abnormal   Collection Time: 09/02/23 12:08 PM  Result Value Ref Range   Sodium 133 (L) 135 - 145 mmol/L   Potassium 4.7 3.5 - 5.1 mmol/L   Chloride 104 98 - 111 mmol/L   CO2 22 22 - 32 mmol/L   Glucose, Bld 110 (H) 70 - 99 mg/dL    Comment: Glucose reference range applies only to samples taken after fasting for at least 8 hours.   BUN 18 8 - 23 mg/dL   Creatinine, Ser 6.96 0.44 - 1.00 mg/dL   Calcium 8.7 (L) 8.9 - 10.3 mg/dL   GFR, Estimated >29 >52 mL/min    Comment: (NOTE) Calculated using the CKD-EPI Creatinine Equation (2021)    Anion gap 7 5 - 15    Comment: Performed at [facility], [address] [place], [place] [address]  Brain natriuretic peptide     Status: None   Collection Time: 09/02/23  12:08 PM  Result Value Ref Range   B Natriuretic Peptide 51.2 0.0 - 100.0 pg/mL    Comment: Performed at [facility], [address], [place], [place] [address]  Resp panel by RT-PCR (RSV, Flu A&B, Covid) Anterior Nasal Swab     Status: Abnormal   Collection Time: 09/02/23 12:08 PM   Specimen: Anterior Nasal Swab  Result Value Ref Range   SARS Coronavirus 2 by RT PCR NEGATIVE NEGATIVE    Comment: (NOTE) SARS-CoV-2 target nucleic acids are NOT DETECTED.  The SARS-CoV-2 RNA is generally detectable in upper respiratory specimens during the acute phase of infection. The lowest concentration of SARS-CoV-2 viral copies this assay can detect is 138 copies/mL. A negative result does not preclude SARS-Cov-2 infection and should not be used as the sole basis for treatment or other patient management decisions. A negative result may occur with  improper specimen collection/handling, submission of specimen other than nasopharyngeal swab, presence of viral mutation(s) within the areas targeted by this assay, and inadequate number of viral copies(<138 copies/mL). A negative result must be combined with clinical observations, patient history, and epidemiological information. The expected result is Negative.  Fact Sheet for Patients:  BloggerCourse.com  Fact Sheet for Healthcare Providers:  SeriousBroker.it  This test is no t yet approved or cleared by the [place] FDA and  has been authorized for detection and/or diagnosis of SARS-CoV-2 by FDA under an Emergency Use Authorization (EUA). This EUA will remain  in effect (meaning this test can be used) for the duration of the COVID-19 declaration under Section 564(b)(1) of the Act, 21 U.S.C.section 360bbb-3(b)(1), unless the authorization is terminated  or revoked sooner.       Influenza A by PCR POSITIVE (A) NEGATIVE   Influenza B by PCR NEGATIVE NEGATIVE    Comment:  (NOTE) The Xpert Xpress SARS-CoV-2/FLU/RSV plus assay is intended as an aid in the diagnosis of influenza from Nasopharyngeal swab specimens and should not be used as a sole basis for treatment. Nasal washings and aspirates are unacceptable for Xpert Xpress SARS-CoV-2/FLU/RSV testing.  Fact Sheet for Patients: BloggerCourse.com  Fact Sheet for Healthcare Providers: SeriousBroker.it  This test is not yet approved or cleared by the [place] FDA and has been authorized for detection and/or diagnosis of SARS-CoV-2 by FDA under an Emergency Use Authorization (EUA). This EUA will remain in effect (meaning this test can be used) for the duration of the COVID-19 declaration under Section 564(b)(1) of the Act, 21 U.S.C. section  360bbb-3(b)(1), unless the authorization is terminated or revoked.     Resp Syncytial Virus by PCR NEGATIVE NEGATIVE    Comment: (NOTE) Fact Sheet for Patients: BloggerCourse.com  Fact Sheet for Healthcare Providers: SeriousBroker.it  This test is not yet approved or cleared by the [place] FDA and has been authorized for detection and/or diagnosis of SARS-CoV-2 by FDA under an Emergency Use Authorization (EUA). This EUA will remain in effect (meaning this test can be used) for the duration of the COVID-19 declaration under Section 564(b)(1) of the Act, 21 U.S.C. section 360bbb-3(b)(1), unless the authorization is terminated or revoked.  Performed at [facility], [address], [place], [place] [address]    DG Chest 2 View Result Date: 09/02/2023 CLINICAL DATA:  Cough.  Congestion.  Shortness of breath. EXAM: CHEST - 2 VIEW COMPARISON:  05/19/2022. FINDINGS: Bilateral lung fields are clear. No pulmonary edema. No dense consolidation or lung collapse. Bilateral costophrenic angles are clear. Normal cardio-mediastinal silhouette. No  acute osseous abnormalities. The soft tissues are within normal limits. IMPRESSION: No active cardiopulmonary disease. Electronically Signed   By: Jules Schick M.D.   On: 09/02/2023 12:54    Pending Labs Unresulted Labs (From admission, onward)     Start     Ordered   09/03/23 0500  Basic metabolic panel  Tomorrow morning,   R        09/02/23 1522   09/03/23 0500  CBC  Tomorrow morning,   R        09/02/23 1522            Vitals/Pain Today's Vitals   09/02/23 1130 09/02/23 1410 09/02/23 1430 09/02/23 1445  BP: (!) 159/80 (!) 154/81 (!) 160/80 (!) 157/85  Pulse: 87 (!) 106 98 100  Resp:  (!) 23 (!) 23 18  Temp:      TempSrc:      SpO2: 97% 94% 98% 98%  Weight:      Height:      PainSc:        Isolation Precautions Droplet precaution  Medications Medications  acetaminophen (TYLENOL) tablet 650 mg (has no administration in time range)    Or  acetaminophen (TYLENOL) suppository 650 mg (has no administration in time range)  traZODone (DESYREL) tablet 25 mg (has no administration in time range)  albuterol (PROVENTIL) (2.5 MG/3ML) 0.083% nebulizer solution 2.5 mg (has no administration in time range)  heparin injection 5,000 Units (has no administration in time range)    Mobility walks with person assist     Focused Assessments Cardiac Assessment Handoff:  Cardiac Rhythm: Normal sinus rhythm Lab Results  Component Value Date   CKTOTAL 61 01/16/2009   CKMB 1.0 01/16/2009   TROPONINI <0.03 12/31/2018   Lab Results  Component Value Date   DDIMER 1.37 (H) 05/01/2022   Does the Patient currently have chest pain? No    R Recommendations: See Admitting Provider Note  Report given to:   Additional Notes: Flu+

## 2023-09-02 NOTE — ED Notes (Signed)
Pt's husband has some confusion and got lost driving earlier today, wife has provided contact of friend Jonny Ruiz - [phone]) to take him home and friend to check on him for a few days. Jonny Ruiz has arrived, and pt's husband is on his way home.

## 2023-09-02 NOTE — Telephone Encounter (Signed)
Patient has arrived at the ED.

## 2023-09-02 NOTE — ED Provider Notes (Signed)
[place] EMERGENCY DEPARTMENT AT [facility] Provider Note   CSN: [id number] Arrival date & time: 09/02/23  1016     History  Chief Complaint  Patient presents with   Cough   Shortness of Breath    April [name] is a 89 y.o. female.   Cough Associated symptoms: shortness of breath   Shortness of Breath Associated symptoms: cough    Patient has a history of CLL thrombocytopenia, hypertension multifocal pneumonia  Patient presents ED with complaints of cough congestion and shortness of breath.  Patient states she has had some mild sore throat as well as some sinus pressure.  The symptoms started a few days ago.  Patient feels like she also has been mildly short of breath.  She has not had any leg swelling.  No chest pain.  She does not think she has had any fevers.  Patient was seen on January 30 by her doctor for urinary tract infection symptoms.  Patient was diagnosed with UTI and was started on antibiotics.  Notes from the doctor's office indicate that her urine culture was sensitive to the antibiotics that she was prescribed.  Home Medications Prior to Admission medications   Medication Sig Start Date End Date Taking? Authorizing Provider  clobetasol cream (TEMOVATE) 0.05 % SMARTSIG:sparingly Topical Twice Daily 04/11/23   [provider]  fluconazole (DIFLUCAN) 200 MG tablet Take 200 mg by mouth once a week. 08/06/23   [provider]  Ibuprofen 200 MG CAPS Take by mouth.    [provider]  loperamide (IMODIUM) 2 MG capsule Take 1 capsule (2 mg total) by mouth 4 (four) times daily as needed for diarrhea or loose stools. Patient not taking: Reported on 08/29/2023 08/31/20   Cheryll Cockayne, MD  Multiple Vitamins-Minerals (MULTIVITAMIN WITH MINERALS) tablet Take 1 tablet by mouth daily.    [provider]  traMADol (ULTRAM) 50 MG tablet TAKE 1 TABLET BY MOUTH EVERY 12 HOURS AS NEEDED Patient not taking: Reported on 08/29/2023 02/21/23    Philip Aspen, Limmie Patricia, MD      Allergies    Codeine, Doxycycline, and Levofloxacin    Review of Systems   Review of Systems  Respiratory:  Positive for cough and shortness of breath.     Physical Exam Updated Vital Signs BP (!) 154/81   Pulse (!) 106   Temp 98.3 F (36.8 C) (Oral)   Resp (!) 23   Ht 1.473 m (4\' 10" )   Wt 44.5 kg   LMP 03/26/1979   SpO2 94%   BMI 20.48 kg/m  Physical Exam Vitals and nursing note reviewed.  Constitutional:      Appearance: She is well-developed. She is not diaphoretic.  HENT:     Head: Normocephalic and atraumatic.     Right Ear: External ear normal.     Left Ear: External ear normal.  Eyes:     General: No scleral icterus.       Right eye: No discharge.        Left eye: No discharge.     Conjunctiva/sclera: Conjunctivae normal.  Neck:     Trachea: No tracheal deviation.  Cardiovascular:     Rate and Rhythm: Normal rate and regular rhythm.  Pulmonary:     Effort: Pulmonary effort is normal. No respiratory distress.     Breath sounds: Normal breath sounds. No stridor. No wheezing or rales.  Abdominal:     General: Bowel sounds are normal. There is no distension.  Palpations: Abdomen is soft.     Tenderness: There is no abdominal tenderness. There is no guarding or rebound.  Musculoskeletal:        General: No tenderness or deformity.     Cervical back: Neck supple.     Right lower leg: No edema.     Left lower leg: No edema.  Skin:    General: Skin is warm and dry.     Findings: No rash.  Neurological:     General: No focal deficit present.     Mental Status: She is alert.     Cranial Nerves: No cranial nerve deficit, dysarthria or facial asymmetry.     Sensory: No sensory deficit.     Motor: No abnormal muscle tone or seizure activity.     Coordination: Coordination normal.  Psychiatric:        Mood and Affect: Mood normal.     ED Results / Procedures / Treatments   Labs (all labs ordered are listed, but  only abnormal results are displayed) Labs Reviewed  RESP PANEL BY RT-PCR (RSV, FLU A&B, COVID)  RVPGX2 - Abnormal; Notable for the following components:      Result Value   Influenza A by PCR POSITIVE (*)    All other components within normal limits  CBC - Abnormal; Notable for the following components:   WBC 30.5 (*)    RBC 3.72 (*)    MCV 106.2 (*)    MCH 34.4 (*)    Platelets 79 (*)    All other components within normal limits  BASIC METABOLIC PANEL - Abnormal; Notable for the following components:   Sodium 133 (*)    Glucose, Bld 110 (*)    Calcium 8.7 (*)    All other components within normal limits  BRAIN NATRIURETIC PEPTIDE    EKG EKG Interpretation Date/Time:  Monday September 02 2023 12:15:38 EST Ventricular Rate:  91 PR Interval:  155 QRS Duration:  75 QT Interval:  348 QTC Calculation: 429 R Axis:   39  Text Interpretation: Sinus rhythm No significant change since last tracing Confirmed by Linwood Dibbles [phone]) on 09/02/2023 12:20:41 PM  Radiology DG Chest 2 View Result Date: 09/02/2023 CLINICAL DATA:  Cough.  Congestion.  Shortness of breath. EXAM: CHEST - 2 VIEW COMPARISON:  05/19/2022. FINDINGS: Bilateral lung fields are clear. No pulmonary edema. No dense consolidation or lung collapse. Bilateral costophrenic angles are clear. Normal cardio-mediastinal silhouette. No acute osseous abnormalities. The soft tissues are within normal limits. IMPRESSION: No active cardiopulmonary disease. Electronically Signed   By: Jules Schick M.D.   On: 09/02/2023 12:54    Procedures Procedures    Medications Ordered in ED Medications - No data to display  ED Course/ Medical Decision Making/ A&P Clinical Course as of 09/02/23 1851  Mon Sep 02, 2023  1410 BNP normal.  Influenza a positive. [JK]  1410 White blood cell count elevated at 30,000, this is decreased from 43,000 [JK]  1411 Chest x-ray without pneumonia. [JK]  1415 O2 dropped into the 80s with a coughing spell [JK]   1517 Case discussed with Dr Erenest Blank regarding admission [JK]    Clinical Course User Index [JK] Linwood Dibbles, MD                                 Medical Decision Making Differential diagnosis includes but not limited to pneumonia, CHF, influenza, COVID  Problems Addressed: Hypoxia:  acute illness or injury that poses a threat to life or bodily functions Influenza A: acute illness or injury that poses a threat to life or bodily functions  Amount and/or Complexity of Data Reviewed Labs: ordered. Decision-making details documented in ED Course. Radiology: ordered and independent interpretation performed.  Risk Decision regarding hospitalization.   Patient presented to ED with complaints of cough congestion and shortness of breath.  Patient's laboratory test were notable for elevated white blood cell count.  This is actually consistent with her CLL.  Her white count is similar to previous values.  Electrolyte panel does not show any acute abnormality.  Patient's BNP is normal.  No evidence of pneumonia or pneumothorax on chest x-ray.  Patient's influenza test is positive.  While the patient was in the ED however she started having a severe coughing spell saturations decreased into the 80s.  It has been remaining in the low 90s just at rest.  With her persistent cough hypoxia related to her influenza I will consult the medical service for admission        Final Clinical Impression(s) / ED Diagnoses Final diagnoses:  Influenza A  Hypoxia  CLL (chronic lymphocytic leukemia) (HCC)    Rx / DC Orders ED Discharge Orders     None         Linwood Dibbles, MD 09/02/23 1851

## 2023-09-03 DIAGNOSIS — D696 Thrombocytopenia, unspecified: Secondary | ICD-10-CM | POA: Diagnosis not present

## 2023-09-03 DIAGNOSIS — R0902 Hypoxemia: Secondary | ICD-10-CM | POA: Insufficient documentation

## 2023-09-03 DIAGNOSIS — C9110 Chronic lymphocytic leukemia of B-cell type not having achieved remission: Secondary | ICD-10-CM | POA: Diagnosis not present

## 2023-09-03 DIAGNOSIS — J101 Influenza due to other identified influenza virus with other respiratory manifestations: Secondary | ICD-10-CM | POA: Diagnosis not present

## 2023-09-03 LAB — BASIC METABOLIC PANEL
Anion gap: 10 (ref 5–15)
BUN: 17 mg/dL (ref 8–23)
CO2: 18 mmol/L — ABNORMAL LOW (ref 22–32)
Calcium: 8.5 mg/dL — ABNORMAL LOW (ref 8.9–10.3)
Chloride: 105 mmol/L (ref 98–111)
Creatinine, Ser: 0.73 mg/dL (ref 0.44–1.00)
GFR, Estimated: >60 mL/min (ref >60)
Glucose, Bld: 107 mg/dL — ABNORMAL HIGH (ref 70–99)
Potassium: 4.9 mmol/L (ref 3.5–5.1)
Sodium: 133 mmol/L — ABNORMAL LOW (ref 135–145)

## 2023-09-03 LAB — CBC
HCT: 39.6 % (ref 36.0–46.0)
Hemoglobin: 12.9 g/dL (ref 12.0–15.0)
MCH: 34 pg (ref 26.0–34.0)
MCHC: 32.6 g/dL (ref 30.0–36.0)
MCV: 104.5 fL — ABNORMAL HIGH (ref 80.0–100.0)
Platelets: 93 10*3/uL — ABNORMAL LOW (ref 150–400)
RBC: 3.79 MIL/uL — ABNORMAL LOW (ref 3.87–5.11)
RDW: 14 % (ref 11.5–15.5)
WBC: 40.5 10*3/uL — ABNORMAL HIGH (ref 4.0–10.5)
nRBC: 0 % (ref 0.0–0.2)

## 2023-09-03 MED ORDER — GUAIFENESIN-DM 100-10 MG/5ML PO SYRP
5.0000 mL | ORAL_SOLUTION | ORAL | Status: DC | PRN
  Administered 2023-09-03: 5 mL via ORAL
  Filled 2023-09-03: qty 5

## 2023-09-03 NOTE — Hospital Course (Addendum)
89 year old woman presented with cough and congestion.  Admitted for intermittent hypoxia and influenza A.  Consultants None   Procedures/Events None

## 2023-09-03 NOTE — Progress Notes (Signed)
  Progress Note   Patient: April [name] FMW:[id number] DOB: [birth year] DOA: 09/02/2023     0 DOS: the patient was seen and examined on 09/03/2023   Brief hospital course: 89 year old woman presented with cough and congestion.  Admitted for intermittent hypoxia and influenza A.  Consultants None   Procedures/Events None   Assessment and Plan: Influenza A Acute hypoxia Tamiflu .  Bronchodilators.  CLL Thrombocytopenia Stable.  Outpatient follow-up          Subjective:  Feels better  Physical Exam: Vitals:   09/03/23 0624 09/03/23 0645 09/03/23 0906 09/03/23 1115  BP:   (!) 143/83 119/72  Pulse: 98  95 92  Resp:   (!) 22 17  Temp:  97.9 F (36.6 C) 99 F (37.2 C)   TempSrc:  Oral Oral   SpO2: 95%  96% 96%  Weight:      Height:       Physical Exam Vitals reviewed.  Constitutional:      General: She is not in acute distress.    Appearance: She is not ill-appearing or toxic-appearing.  Cardiovascular:     Rate and Rhythm: Normal rate and regular rhythm.     Heart sounds: No murmur heard. Pulmonary:     Effort: Pulmonary effort is normal. No respiratory distress.     Breath sounds: No wheezing, rhonchi or rales.  Neurological:     Mental Status: She is alert.  Psychiatric:        Behavior: Behavior normal.     Data Reviewed: Basic metabolic panel noted, modest hyponatremia trivial WBC stable at 40.5 Platelets stable at 93 Chest x-ray no acute disease EKG reassuring  Family Communication: none  Disposition: Status is: Observation   Planned Discharge Destination: Home    Time spent: 30 minutes  Author: Toribio Door, MD 09/03/2023 11:40 AM  For on call review [email or web address].

## 2023-09-04 DIAGNOSIS — J101 Influenza due to other identified influenza virus with other respiratory manifestations: Secondary | ICD-10-CM | POA: Diagnosis not present

## 2023-09-04 MED ORDER — OSELTAMIVIR PHOSPHATE 30 MG PO CAPS: 30.0000 mg | ORAL_CAPSULE | Freq: Two times a day (BID) | ORAL | 0 refills | Status: DC

## 2023-09-04 MED ORDER — GUAIFENESIN-DM 100-10 MG/5ML PO SYRP: 5.0000 mL | ORAL_SOLUTION | ORAL | 0 refills | Status: DC | PRN

## 2023-09-04 NOTE — Plan of Care (Signed)

## 2023-09-04 NOTE — Care Management Obs Status (Signed)
 MEDICARE OBSERVATION STATUS NOTIFICATION   Patient Details  Name: MEILING HENDRIKS MRN: [id number] Date of Birth: 01/03/1935   Medicare Observation Status Notification Given:  Yes    MahabirThersia Flax, RN 09/04/2023, 12:14 PM

## 2023-09-04 NOTE — Discharge Summary (Signed)
 Physician Discharge Summary   Patient: April [name] MRN: [id number] DOB: January 06, 1935  Admit date:     09/02/2023  Discharge date: 09/04/23  Discharge Physician: Carliss LELON Canales   PCP: Theophilus Andrews, Tully GRADE, MD   Recommendations at discharge:    Pt to be discharged home.   If you experience worsening fever, chills, chest pain, shortness of breath, or other concerning symptoms, please call your PCP or go to the emergency department immediately.    Discharge Diagnoses: Principal Problem:   Influenza A -Initiated on IV fluid hydration and tamiflu .  Showing marked improvement.  Pt ambulatory and eager for discharge home.  Will give prescription of Tamiflu  to take as directed.    Active Problems:   CLL (chronic lymphocytic leukemia) (HCC)   Thrombocytopenia (HCC)   Hypoxia  Resolved Problems:   * No resolved hospital problems. *  Hospital Course: 89 year old woman presented with cough and congestion.  Admitted for intermittent hypoxia and influenza A.  Consultants None   Procedures/Events None    Assessment and Plan: No notes have been filed under this hospital service. Service: Hospitalist       Pain control - [place]  Controlled Substance Reporting System database was reviewed. and patient was instructed, not to drive, operate heavy machinery, perform activities at heights, swimming or participation in water activities or provide baby-sitting services while on Pain, Sleep and Anxiety Medications; until their outpatient Physician has advised to do so again. Also recommended to not to take more than prescribed Pain, Sleep and Anxiety Medications.  Consultants:  Procedures performed:   Disposition: Home Diet recommendation:  Discharge Diet Orders (From admission, onward)     Start     Ordered   09/04/23 0000  Diet - low sodium heart healthy        09/04/23 1116           Cardiac diet DISCHARGE MEDICATION: Allergies as of 09/04/2023       Reactions    Codeine Nausea Only   Doxycycline Dermatitis   Levofloxacin Rash        Medication List     STOP taking these medications    loperamide  2 MG capsule Commonly known as: IMODIUM    traMADol  50 MG tablet Commonly known as: ULTRAM        TAKE these medications    guaiFENesin -dextromethorphan  100-10 MG/5ML syrup Commonly known as: ROBITUSSIN DM Take 5 mLs by mouth every 4 (four) hours as needed for cough.   Ibuprofen 200 MG Caps Take 200-600 mg by mouth every 6 (six) hours as needed (for pain, fever, or headaches).   One-A-Day Womens Prenatal 1 28-0.8-235 MG Caps Take 1 tablet by mouth daily with breakfast.   oseltamivir  30 MG capsule Commonly known as: TAMIFLU  Take 1 capsule (30 mg total) by mouth 2 (two) times daily.        Discharge Exam: Filed Weights   09/02/23 1115  Weight: 44.5 kg     Condition at discharge: improving  The results of significant diagnostics from this hospitalization (including imaging, microbiology, ancillary and laboratory) are listed below for reference.   Imaging Studies: DG Chest 2 View Result Date: 09/02/2023 CLINICAL DATA:  Cough.  Congestion.  Shortness of breath. EXAM: CHEST - 2 VIEW COMPARISON:  05/19/2022. FINDINGS: Bilateral lung fields are clear. No pulmonary edema. No dense consolidation or lung collapse. Bilateral costophrenic angles are clear. Normal cardio-mediastinal silhouette. No acute osseous abnormalities. The soft tissues are within normal limits. IMPRESSION: No active cardiopulmonary disease.  Electronically Signed   By: Ree Molt M.D.   On: 09/02/2023 12:54    Microbiology: Results for orders placed or performed during the hospital encounter of 09/02/23  Resp panel by RT-PCR (RSV, Flu A&B, Covid) Anterior Nasal Swab     Status: Abnormal   Collection Time: 09/02/23 12:08 PM   Specimen: Anterior Nasal Swab  Result Value Ref Range Status   SARS Coronavirus 2 by RT PCR NEGATIVE NEGATIVE Final    Comment:  (NOTE) SARS-CoV-2 target nucleic acids are NOT DETECTED.  The SARS-CoV-2 RNA is generally detectable in upper respiratory specimens during the acute phase of infection. The lowest concentration of SARS-CoV-2 viral copies this assay can detect is 138 copies/mL. A negative result does not preclude SARS-Cov-2 infection and should not be used as the sole basis for treatment or other patient management decisions. A negative result may occur with  improper specimen collection/handling, submission of specimen other than nasopharyngeal swab, presence of viral mutation(s) within the areas targeted by this assay, and inadequate number of viral copies(<138 copies/mL). A negative result must be combined with clinical observations, patient history, and epidemiological information. The expected result is Negative.  Fact Sheet for Patients:  bloggercourse.com  Fact Sheet for Healthcare Providers:  seriousbroker.it  This test is no t yet approved or cleared by the [place]  FDA and  has been authorized for detection and/or diagnosis of SARS-CoV-2 by FDA under an Emergency Use Authorization (EUA). This EUA will remain  in effect (meaning this test can be used) for the duration of the COVID-19 declaration under Section 564(b)(1) of the Act, 21 U.S.C.section 360bbb-3(b)(1), unless the authorization is terminated  or revoked sooner.       Influenza A by PCR POSITIVE (A) NEGATIVE Final   Influenza B by PCR NEGATIVE NEGATIVE Final    Comment: (NOTE) The Xpert Xpress SARS-CoV-2/FLU/RSV plus assay is intended as an aid in the diagnosis of influenza from Nasopharyngeal swab specimens and should not be used as a sole basis for treatment. Nasal washings and aspirates are unacceptable for Xpert Xpress SARS-CoV-2/FLU/RSV testing.  Fact Sheet for Patients: bloggercourse.com  Fact Sheet for Healthcare  Providers: seriousbroker.it  This test is not yet approved or cleared by the [place]  FDA and has been authorized for detection and/or diagnosis of SARS-CoV-2 by FDA under an Emergency Use Authorization (EUA). This EUA will remain in effect (meaning this test can be used) for the duration of the COVID-19 declaration under Section 564(b)(1) of the Act, 21 U.S.C. section 360bbb-3(b)(1), unless the authorization is terminated or revoked.     Resp Syncytial Virus by PCR NEGATIVE NEGATIVE Final    Comment: (NOTE) Fact Sheet for Patients: bloggercourse.com  Fact Sheet for Healthcare Providers: seriousbroker.it  This test is not yet approved or cleared by the [place]  FDA and has been authorized for detection and/or diagnosis of SARS-CoV-2 by FDA under an Emergency Use Authorization (EUA). This EUA will remain in effect (meaning this test can be used) for the duration of the COVID-19 declaration under Section 564(b)(1) of the Act, 21 U.S.C. section 360bbb-3(b)(1), unless the authorization is terminated or revoked.  Performed at [facility], [address], [place], [place] [address]     Labs: CBC: Recent Labs  Lab 09/02/23 1208 09/03/23 0641  WBC 30.5* 40.5*  HGB 12.8 12.9  HCT 39.5 39.6  MCV 106.2* 104.5*  PLT 79* 93*   Basic Metabolic Panel: Recent Labs  Lab 09/02/23 1208 09/03/23 0641  NA 133*  133*  K 4.7 4.9  CL 104 105  CO2 22 18*  GLUCOSE 110* 107*  BUN 18 17  CREATININE 0.74 0.73  CALCIUM 8.7* 8.5*   Liver Function Tests: No results for input(s): AST, ALT, ALKPHOS, BILITOT, PROT, ALBUMIN in the last 168 hours. CBG: No results for input(s): GLUCAP in the last 168 hours.  Discharge time spent: less than 30 minutes.  Signed: Carliss LELON Canales, DO [facility] 09/04/2023

## 2023-09-04 NOTE — TOC Initial Note (Signed)
 Transition of Care [facility]) - Initial/Assessment Note    Patient Details  Name: April [name] MRN: [id number] Date of Birth: [birth year]  [facility] CM/SW Contact:    Bascom Service, RN Phone Number: 09/04/2023, 9:40 AM  Clinical Narrative: d/c home.                  Expected Discharge Plan: Home/Self Care Barriers to Discharge: Continued Medical Work up   Patient Goals and CMS Choice Patient states their goals for this hospitalization and ongoing recovery are:: Home CMS Medicare.gov Compare Post Acute Care list provided to:: Patient Choice offered to / list presented to : Patient      Expected Discharge Plan and Services                                              Prior Living Arrangements/Services                       Activities of Daily Living   ADL Screening (condition at time of admission) Independently performs ADLs?: Yes (appropriate for developmental age) Is the patient deaf or have difficulty hearing?: No Does the patient have difficulty seeing, even when wearing glasses/contacts?: No Does the patient have difficulty concentrating, remembering, or making decisions?: No  Permission Sought/Granted                  Emotional Assessment              Admission diagnosis:  CLL (chronic lymphocytic leukemia) (HCC) [C91.10] Influenza A [J10.1] Hypoxia [R09.02] Patient Active Problem List   Diagnosis Date Noted   Hypoxia 09/03/2023   Influenza A 09/02/2023   Pancytopenia (HCC) 05/02/2022   Hypokalemia 05/02/2022   Multifocal pneumonia 04/30/2022   COVID-19 virus infection 04/30/2022   Yeast infection 10/25/2020   Urinary retention 10/25/2020   E. coli UTI (urinary tract infection) 10/17/2020   Dysuria 10/13/2020   Chronic diarrhea 10/13/2020   Chronic right-sided low back pain with right-sided sciatica 09/27/2020   Vitamin D  deficiency 03/09/2020   Transaminitis 03/09/2020   Bladder distension 10/13/2019    Weight loss, abnormal 09/30/2019   Acute metabolic encephalopathy 07/15/2019   Other constipation 06/18/2019   Physical debility 06/18/2019   Abdominal distension 06/08/2019   Severe pain 06/08/2019   Intractable back pain 06/08/2019   Closed wedge compression fracture of T8 vertebra (HCC) 06/07/2019   Sinus tachycardia 06/07/2019   Chronic pain 06/07/2019   Goals of care, counseling/discussion 05/14/2019   Abnormal liver enzymes 05/14/2019   Dehydration 05/14/2019   Failure to thrive in adult 05/14/2019   Abdominal pain 04/16/2019   Cancer associated pain 04/16/2019   History of breast cancer 06/19/2018   Skin lesion 05/06/2017   Essential hypertension 12/14/2016   Systolic hypertension, isolated 11/16/2016   Central retinal vein occlusion 10/18/2016   Preventive measure 04/28/2015   Thrombocytopenia (HCC) 04/28/2015   Idiopathic scoliosis 10/23/2012   CLL (chronic lymphocytic leukemia) (HCC) 03/02/2009   VENOUS INSUFFICIENCY, CHRONIC 10/28/2008   Dyslipidemia 10/27/2007   Osteoporosis 10/27/2007   SKIN CANCER, HX OF 10/27/2007   DIVERTICULITIS, HX OF 04/08/2007   PCP:  Theophilus Andrews, Tully GRADE, MD Pharmacy:   [facility] [phone] [address], [place] - [address] [place] [address] Phone: [phone] Fax: [phone]     Social Drivers of  Health (SDOH) Social History: SDOH Screenings   Food Insecurity: No Food Insecurity (09/03/2023)  Housing: Low Risk  (09/03/2023)  Transportation Needs: No Transportation Needs (09/03/2023)  Utilities: Not At Risk (09/03/2023)  Alcohol Screen: Low Risk  (05/17/2023)  Depression (PHQ2-9): Low Risk  (05/17/2023)  Financial Resource Strain: Low Risk  (05/17/2023)  Physical Activity: Inactive (05/17/2023)  Social Connections: Unknown (09/03/2023)  Stress: No Stress Concern Present (05/17/2023)  Tobacco Use: Medium Risk (09/02/2023)  Health Literacy: Adequate Health Literacy (05/17/2023)   SDOH Interventions:      Readmission Risk Interventions    05/01/2022    3:59 PM  Readmission Risk Prevention Plan  Post Dischage Appt Complete  Medication Screening Complete  Transportation Screening Complete

## 2023-09-25 ENCOUNTER — Ambulatory Visit: Payer: Medicare PPO | Admitting: Adult Health

## 2023-09-25 VITALS — BP 120/70 | HR 87 | Temp 98.1°F | Ht <= 58 in | Wt 100.0 lb

## 2023-09-25 DIAGNOSIS — R109 Unspecified abdominal pain: Secondary | ICD-10-CM

## 2023-09-25 DIAGNOSIS — H9203 Otalgia, bilateral: Secondary | ICD-10-CM | POA: Diagnosis not present

## 2023-09-25 DIAGNOSIS — J020 Streptococcal pharyngitis: Secondary | ICD-10-CM | POA: Diagnosis not present

## 2023-09-25 LAB — POCT URINALYSIS DIPSTICK
Bilirubin, UA: NEGATIVE
Blood, UA: POSITIVE
Glucose, UA: NEGATIVE
Ketones, UA: NEGATIVE
Nitrite, UA: NEGATIVE
Protein, UA: NEGATIVE
Spec Grav, UA: 1.025 (ref 1.010–1.025)
Urobilinogen, UA: 0.2 U/dL
pH, UA: 6 (ref 5.0–8.0)

## 2023-09-25 LAB — POCT RAPID STREP A (OFFICE): Rapid Strep A Screen: POSITIVE — AB

## 2023-09-25 MED ORDER — AMOXICILLIN-POT CLAVULANATE 875-125 MG PO TABS: 1.0000 | ORAL_TABLET | Freq: Two times a day (BID) | ORAL | 0 refills | Status: DC

## 2023-09-25 NOTE — Progress Notes (Signed)
 Subjective:    Patient ID: April [name], female    DOB: 05/06/1935, 89 y.o.   MRN: [id number]  HPI 89 year old female who  has a past medical history of Breast CA (HCC), Bronchitis, chronic (HCC), Compression fracture of body of thoracic vertebra (HCC), Eye hemorrhage, left, Leukemia, chronic lymphoid (HCC), Lymphomatoid papulosis, and Osteoporosis (12/19/2021).  She is a patient of Dr. Ardyth Harps who I am seeing today for multiple acute issues.   Sore throat - reports that for the last two weeks she had a sore throat that will not go away. She has pain when she swallows. She does not have difficulty swallowing. Has not had fevers or chills. She has not been exposed to strep throat.   Ear ache- she was diagnosed with influenza about three weeks ago and was treated with Tamiflu. At this time she had ear pain/ear fullness. She reports that her symptoms of the flu have nearly resolved but that she still has ear pressure.   Concern for UTI- reports that she gets UTIs frequently. Her symptoms are consistent with other UTI's that she has had. She has left side low back/flank pain. She has not had any dysuria, hematuria, urgency or frequency. Symptoms have been present for a few days.    Review of Systems See HPI   Past Medical History:  Diagnosis Date   Breast CA (HCC)    Bronchitis, chronic (HCC)    Compression fracture of body of thoracic vertebra (HCC)    Eye hemorrhage, left    Leukemia, chronic lymphoid (HCC)    Lymphomatoid papulosis    INCREASED RISK FOR LYMPHOMA   Osteoporosis 12/19/2021   severe    Social History   Socioeconomic History   Marital status: Married    Spouse name: Not on file   Number of children: Not on file   Years of education: Not on file   Highest education level: Not on file  Occupational History   Not on file  Tobacco Use   Smoking status: Former    Current packs/day: 0.00    Types: Cigarettes    Quit date: 03/26/1955    Years since quitting:  68.5   Smokeless tobacco: Never  Vaping Use   Vaping status: Never Used  Substance and Sexual Activity   Alcohol use: Not Currently   Drug use: Never   Sexual activity: Not Currently    Partners: Male    Birth control/protection: Surgical    Comment: HYST-1st intercourse 89 yo-More than 5 partners  Other Topics Concern   Not on file  Social History Narrative   Not on file   Social Drivers of Health   Financial Resource Strain: Low Risk  (05/17/2023)   Overall Financial Resource Strain (CARDIA)    Difficulty of Paying Living Expenses: Not hard at all  Food Insecurity: No Food Insecurity (09/03/2023)   Hunger Vital Sign    Worried About Running Out of Food in the Last Year: Never true    Ran Out of Food in the Last Year: Never true  Transportation Needs: No Transportation Needs (09/03/2023)   PRAPARE - Administrator, Civil Service (Medical): No    Lack of Transportation (Non-Medical): No  Physical Activity: Inactive (05/17/2023)   Exercise Vital Sign    Days of Exercise per Week: 0 days    Minutes of Exercise per Session: 0 min  Stress: No Stress Concern Present (05/17/2023)   [employer] of Occupational Health - Occupational Stress  Questionnaire    Feeling of Stress : Not at all  Social Connections: Unknown (09/03/2023)   Social Connection and Isolation Panel [NHANES]    Frequency of Communication with Friends and Family: Three times a week    Frequency of Social Gatherings with Friends and Family: Patient declined    Attends Religious Services: Patient declined    Active Member of Clubs or Organizations: No    Attends Banker Meetings: Never    Marital Status: Married  Catering manager Violence: Not At Risk (09/03/2023)   Humiliation, Afraid, Rape, and Kick questionnaire    Fear of Current or Ex-Partner: No    Emotionally Abused: No    Physically Abused: No    Sexually Abused: No    Past Surgical History:  Procedure Laterality Date    ABDOMINAL HYSTERECTOMY  1980   BREAST IMPLANTS REMOVED  2001   BREAST SURGERY     Bilateral mastectomy   IR RADIOLOGIST EVAL & MGMT  07/01/2019   MASTECTOMY  1982   BILATERAL   OOPHORECTOMY     BSO   PARTIAL COLECTOMY     INTESTINAL ABSCESS WITH SALPINGECTOMY   RECONSTRUCTION LEFT ELBOW     TONSILLECTOMY     UMBILLICAL HERNIA REPAIR      Family History  Problem Relation Age of Onset   Heart disease Mother    Heart disease Sister    Cancer Brother        HODGKINS   Cancer Brother        leukemia   Parkinson's disease Brother     Allergies  Allergen Reactions   Bactrim [Sulfamethoxazole-Trimethoprim]     Pt stated that it caused her    Codeine Nausea Only   Doxycycline Dermatitis   Levofloxacin Rash    Current Outpatient Medications on File Prior to Visit  Medication Sig Dispense Refill   guaiFENesin-dextromethorphan (ROBITUSSIN DM) 100-10 MG/5ML syrup Take 5 mLs by mouth every 4 (four) hours as needed for cough. 118 mL 0   Ibuprofen 200 MG CAPS Take 200-600 mg by mouth every 6 (six) hours as needed (for pain, fever, or headaches).     oseltamivir (TAMIFLU) 30 MG capsule Take 1 capsule (30 mg total) by mouth 2 (two) times daily. 8 capsule 0   Prenat-Fe Carbonyl-FA-Omega 3 (ONE-A-DAY WOMENS PRENATAL 1) 28-0.8-235 MG CAPS Take 1 tablet by mouth daily with breakfast.     No current facility-administered medications on file prior to visit.    BP 120/70   Pulse 87   Temp 98.1 F (36.7 C) (Oral)   Ht 4\' 10"  (1.473 m)   Wt 100 lb (45.4 kg)   LMP 03/26/1979   SpO2 98%   BMI 20.90 kg/m       Objective:   Physical Exam Vitals and nursing note reviewed.  Constitutional:      Appearance: Normal appearance.  HENT:     Right Ear: Hearing, tympanic membrane, ear canal and external ear normal.     Left Ear: Hearing, tympanic membrane, ear canal and external ear normal.     Mouth/Throat:     Tonsils: Tonsillar exudate present. 2+ on the right. 2+ on the left.   Cardiovascular:     Rate and Rhythm: Regular rhythm.     Pulses: Normal pulses.     Heart sounds: Normal heart sounds.  Pulmonary:     Effort: Pulmonary effort is normal.     Breath sounds: Normal breath sounds.  Abdominal:  Tenderness: There is abdominal tenderness in the suprapubic area and left lower quadrant. There is no right CVA tenderness or left CVA tenderness.  Lymphadenopathy:     Head:     Right side of head: Submandibular and tonsillar adenopathy present.     Left side of head: Submandibular and tonsillar adenopathy present.  Skin:    General: Skin is warm and dry.  Neurological:     General: No focal deficit present.     Mental Status: She is oriented to person, place, and time.  Psychiatric:        Mood and Affect: Mood normal.        Behavior: Behavior normal.        Thought Content: Thought content normal.        Judgment: Judgment normal.       Assessment & Plan:  1. Acute flank pain (Primary)  - POC Urinalysis Dipstick + leuks and blood. Negative for nitrities. Will send culture and cover with Augmentin that we will use for strep throat.  - Culture, Urine; Future  2. Strep throat  - POC Rapid Strep A- Positive  - amoxicillin-clavulanate (AUGMENTIN) 875-125 MG tablet; Take 1 tablet by mouth 2 (two) times daily.  Dispense: 20 tablet; Refill: 0  3. Otalgia of both ears - No signs of infection. Should resolve after strep throat is cured.  - Follow up in the next 3-4 days if her symptoms are not resolving    Shirline Frees, NP  Time spent with patient today was 30 minutes which consisted of chart review, discussing strep throat, ear pain and UTi's, work up, treatment answering questions and documentation.

## 2023-09-29 LAB — URINE CULTURE
MICRO NUMBER:: 16131951
SPECIMEN QUALITY:: ADEQUATE

## 2023-10-14 ENCOUNTER — Telehealth: Payer: Self-pay

## 2023-10-14 NOTE — Telephone Encounter (Signed)
 Pt ready for scheduling 11/11/23  Estimated out-of-pocket cost due at time of visit: $40  Primary Insurance: [employer]  Prior Auth: On file PA#:  Valid:   This summary of benefits is an estimation of the patient's out-of-pocket cost. Exact cost may vary based on individual plan coverage.

## 2023-10-21 ENCOUNTER — Ambulatory Visit: Admitting: Internal Medicine

## 2023-10-21 ENCOUNTER — Encounter: Payer: Self-pay | Admitting: Internal Medicine

## 2023-10-21 VITALS — BP 108/70 | HR 85 | Temp 97.3°F | Wt 99.1 lb

## 2023-10-21 DIAGNOSIS — L509 Urticaria, unspecified: Secondary | ICD-10-CM

## 2023-10-21 MED ORDER — HYDROXYZINE PAMOATE 25 MG PO CAPS: 25.0000 mg | ORAL_CAPSULE | Freq: Every evening | ORAL | 0 refills | Status: DC | PRN

## 2023-10-21 NOTE — Progress Notes (Signed)
 Established Patient Office Visit     CC/Reason for Visit: "Itchy rash"  HPI: April [name] is a 89 y.o. female who is coming in today for the above mentioned reasons.  For the past week she has been having urticarial type rash over her torso and back.  A few days before this started she began applying a new lotion that is retinol based.  She has since discontinued it.  She has been taking Allegra daily and cortisone cream with some relief.  It is especially itchy at nighttime.   Past Medical/Surgical History: Past Medical History:  Diagnosis Date   Breast CA (HCC)    Bronchitis, chronic (HCC)    Compression fracture of body of thoracic vertebra (HCC)    Eye hemorrhage, left    Leukemia, chronic lymphoid (HCC)    Lymphomatoid papulosis    INCREASED RISK FOR LYMPHOMA   Osteoporosis 12/19/2021   severe    Past Surgical History:  Procedure Laterality Date   ABDOMINAL HYSTERECTOMY  1980   BREAST IMPLANTS REMOVED  2001   BREAST SURGERY     Bilateral mastectomy   IR RADIOLOGIST EVAL & MGMT  07/01/2019   MASTECTOMY  1982   BILATERAL   OOPHORECTOMY     BSO   PARTIAL COLECTOMY     INTESTINAL ABSCESS WITH SALPINGECTOMY   RECONSTRUCTION LEFT ELBOW     TONSILLECTOMY     UMBILLICAL HERNIA REPAIR      Social History:  reports that she quit smoking about 68 years ago. Her smoking use included cigarettes. She has never used smokeless tobacco. She reports that she does not currently use alcohol. She reports that she does not use drugs.  Allergies: Allergies  Allergen Reactions   Bactrim [Sulfamethoxazole-Trimethoprim]     Pt stated that it caused her to have body aches   Codeine Nausea Only   Doxycycline Dermatitis   Levofloxacin Rash    Family History:  Family History  Problem Relation Age of Onset   Heart disease Mother    Heart disease Sister    Cancer Brother        HODGKINS   Cancer Brother        leukemia   Parkinson's disease Brother      Current  Outpatient Medications:    hydrOXYzine (VISTARIL) 25 MG capsule, Take 1 capsule (25 mg total) by mouth at bedtime as needed., Disp: 30 capsule, Rfl: 0   Ibuprofen 200 MG CAPS, Take 200-600 mg by mouth every 6 (six) hours as needed (for pain, fever, or headaches)., Disp: , Rfl:    Prenat-Fe Carbonyl-FA-Omega 3 (ONE-A-DAY WOMENS PRENATAL 1) 28-0.8-235 MG CAPS, Take 1 tablet by mouth daily with breakfast., Disp: , Rfl:   Review of Systems:  Negative unless indicated in HPI.   Physical Exam: Vitals:   10/21/23 1604  BP: 108/70  Pulse: 85  Temp: (!) 97.3 F (36.3 C)  TempSrc: Oral  SpO2: 97%  Weight: 99 lb 1.6 oz (45 kg)    Body mass index is 20.71 kg/m.   Physical Exam Skin:    Comments: Classic urticarial rash over back.      Impression and Plan:  Urticaria -     hydrOXYzine Pamoate; Take 1 capsule (25 mg total) by mouth at bedtime as needed.  Dispense: 30 capsule; Refill: 0  -Questionably related to retinal cream.  Asked to discontinue which she already has.  Add hydroxyzine at nighttime.   Time spent:23 minutes reviewing chart, interviewing  and examining patient and formulating plan of care.     Chaya Jan, MD [facility]

## 2023-10-23 ENCOUNTER — Encounter: Payer: Self-pay | Admitting: Internal Medicine

## 2023-11-06 DIAGNOSIS — N3020 Other chronic cystitis without hematuria: Secondary | ICD-10-CM | POA: Diagnosis not present

## 2023-11-06 DIAGNOSIS — R3914 Feeling of incomplete bladder emptying: Secondary | ICD-10-CM | POA: Diagnosis not present

## 2023-11-06 DIAGNOSIS — R1032 Left lower quadrant pain: Secondary | ICD-10-CM | POA: Diagnosis not present

## 2023-12-10 DIAGNOSIS — R3914 Feeling of incomplete bladder emptying: Secondary | ICD-10-CM | POA: Diagnosis not present

## 2023-12-10 DIAGNOSIS — N3020 Other chronic cystitis without hematuria: Secondary | ICD-10-CM | POA: Diagnosis not present

## 2023-12-24 ENCOUNTER — Inpatient Hospital Stay: Payer: Medicare PPO

## 2023-12-24 ENCOUNTER — Encounter: Payer: Self-pay | Admitting: Hematology and Oncology

## 2023-12-24 ENCOUNTER — Inpatient Hospital Stay: Payer: Medicare PPO | Attending: Hematology and Oncology | Admitting: Hematology and Oncology

## 2023-12-24 VITALS — BP 169/78 | HR 84 | Temp 98.5°F | Resp 17 | Ht <= 58 in | Wt 97.2 lb

## 2023-12-24 DIAGNOSIS — R339 Retention of urine, unspecified: Secondary | ICD-10-CM

## 2023-12-24 DIAGNOSIS — C9110 Chronic lymphocytic leukemia of B-cell type not having achieved remission: Secondary | ICD-10-CM | POA: Diagnosis not present

## 2023-12-24 DIAGNOSIS — D696 Thrombocytopenia, unspecified: Secondary | ICD-10-CM | POA: Diagnosis not present

## 2023-12-24 DIAGNOSIS — N390 Urinary tract infection, site not specified: Secondary | ICD-10-CM | POA: Diagnosis not present

## 2023-12-24 LAB — CBC WITH DIFFERENTIAL/PLATELET
Abs Immature Granulocytes: 0.04 10*3/uL (ref 0.00–0.07)
Basophils Absolute: 0 10*3/uL (ref 0.0–0.1)
Basophils Relative: 0 %
Eosinophils Absolute: 0.1 10*3/uL (ref 0.0–0.5)
Eosinophils Relative: 0 %
HCT: 40.5 % (ref 36.0–46.0)
Hemoglobin: 13.4 g/dL (ref 12.0–15.0)
Immature Granulocytes: 0 %
Lymphocytes Relative: 88 %
Lymphs Abs: 39.2 10*3/uL — ABNORMAL HIGH (ref 0.7–4.0)
MCH: 33.8 pg (ref 26.0–34.0)
MCHC: 33.1 g/dL (ref 30.0–36.0)
MCV: 102.3 fL — ABNORMAL HIGH (ref 80.0–100.0)
Monocytes Absolute: 2.2 10*3/uL — ABNORMAL HIGH (ref 0.1–1.0)
Monocytes Relative: 5 %
Neutro Abs: 3.2 10*3/uL (ref 1.7–7.7)
Neutrophils Relative %: 7 %
Platelets: 92 10*3/uL — ABNORMAL LOW (ref 150–400)
RBC: 3.96 MIL/uL (ref 3.87–5.11)
RDW: 13.2 % (ref 11.5–15.5)
Smear Review: DECREASED
WBC: 44.8 10*3/uL — ABNORMAL HIGH (ref 4.0–10.5)
nRBC: 0 % (ref 0.0–0.2)

## 2023-12-24 NOTE — Assessment & Plan Note (Addendum)
 Overall, her chronic leukocytosis is stable Her platelet count is low intermittently She is not anemic Overall, she has slight progression of CLL but not at the point she needs treatment I plan to see her back in 4 months

## 2023-12-24 NOTE — Assessment & Plan Note (Addendum)
 She has chronic urinary retention and is not doing self cath She has recurrent urinary tract infection and multiple physicians have stopped ordering urine culture On review on her previous urine cultures, it is not clear to me that the Keflex  is treating her infection I recommend consideration for infectious disease referral for best recommendation to treat her recurrent UTI in the setting of chronic urinary retention and she agreed

## 2023-12-24 NOTE — Progress Notes (Signed)
 [facility] OFFICE PROGRESS NOTE  Patient Care Team: Zilphia Hilt, Charyl Coppersmith, MD as PCP - General (Internal Medicine) Albina Hull, NP (Inactive) as Nurse Practitioner (Hospice and Palliative Medicine)  Assessment & Plan CLL (chronic lymphocytic leukemia) (HCC) Overall, her chronic leukocytosis is stable Her platelet count is low intermittently She is not anemic Overall, she has slight progression of CLL but not at the point she needs treatment I plan to see her back in 4 months Thrombocytopenia (HCC) She has chronic thrombocytopenia for some time, could be related to CLL She is not symptomatic Observe for now Urinary retention She has chronic urinary retention and is not doing self cath She has recurrent urinary tract infection and multiple physicians have stopped ordering urine culture On review on her previous urine cultures, it is not clear to me that the Keflex  is treating her infection I recommend consideration for infectious disease referral for best recommendation to treat her recurrent UTI in the setting of chronic urinary retention and she agreed  Orders Placed This Encounter  Procedures   Ambulatory referral to Infectious Disease    Referral Priority:   Routine    Referral Type:   Consultation    Referral Reason:   Specialty Services Required    Requested Specialty:   Infectious Diseases    Number of Visits Requested:   1     Almeda Jacobs, MD  INTERVAL HISTORY: she returns for surveillance follow-up for recurrent CLL She is here accompanied by her husband She has recurrent UTI over the past 4 months since I saw her She is not doing self cath anymore She is able to empty her bladder but she still have significant abdominal distention due to her urinary retention She has palpated some lymphadenopathy in her neck  PHYSICAL EXAMINATION: ECOG PERFORMANCE STATUS: 1 - Symptomatic but completely ambulatory  Vitals:   12/24/23 1331  BP: (!) 169/78   Pulse: 84  Resp: 17  Temp: 98.5 F (36.9 C)  SpO2: 98%   Filed Weights   12/24/23 1331  Weight: 97 lb 3.2 oz (44.1 kg)   She has palpable lymph node in the right side of her neck, likely due to CLL Relevant data reviewed during this visit included CBC, recent urine culture

## 2023-12-24 NOTE — Assessment & Plan Note (Addendum)
She has chronic thrombocytopenia for some time, could be related to CLL She is not symptomatic Observe for now

## 2024-01-19 ENCOUNTER — Encounter: Payer: Self-pay | Admitting: Infectious Disease

## 2024-01-19 DIAGNOSIS — N390 Urinary tract infection, site not specified: Secondary | ICD-10-CM | POA: Insufficient documentation

## 2024-01-19 DIAGNOSIS — R8271 Bacteriuria: Secondary | ICD-10-CM | POA: Insufficient documentation

## 2024-01-19 NOTE — Progress Notes (Unsigned)
 Reason for infectious disease consultation: Chronic bacteriuria and recurrent urinary tract infections and patient with urinary retention  Requesting physician: Almarie Bedford, MD PCP: Tully Nap, MD   Subjective:    Patient ID: April [name], female    DOB: April 16, 1935, 89 y.o.   MRN: [id number]  HPI  89 year old lady with CLL and chronic thrombocytopenia followed by Dr. Bedford with Hematology/Oncology and with a history of urinary retention chronic bacteriuria and at times actual recurrent urinary tract infections who was referred to our group for evaluation of this problem in 2022 when she saw my former partner Dr. Prentice Shutter.  Discussed the use of AI scribe software for clinical note transcription with the patient, who gave verbal consent to proceed.  History of Present Illness   April [name] is an 89 year old female with chronic lymphocytic leukemia who presents with history of urinary retention and prescriptions for antibiotics for what have been thought to be  recurrent urinary tract infections and yeast infections. She was referred by Dr. Bedford for evaluation of recurrent urinary tract infections and yeast infections.  She has a history of urinary retention, likely due to a past spinal cord injury, and previously performed self-catheterizations up to four times a day but has since stopped. She manages her condition by drinking plenty of fluids and visiting [facility] as needed.  She has been diagnosed with recurrent urinary tract infections and has been on multiple courses of antibiotics, though none in the past five weeks.    In talking to her and in reviewing Dr. Nonda note and that of Dr. Bedford it is is NOT CLEAR to me HOW OFTEN the patient actually has a UTI and how often we are instead dealing with asymptomatic bacteruria.   She does have chronic abdominal pain that she believes happends when there is a change  in her CLL numbers   She gets  recurrent vaginal yeast infections esp after antibacterial antibiotics though recently topical pre-emptive treatment every 2 weeks has been helpful.     Antibiotics often lead to frequent yeast infections. Her symptoms sometimes include abdominal pain, particularly when her blood counts related to chronic lymphocytic leukemia are elevated.  Chronic yeast infections are primarily vaginal, occasionally affecting her mouth. She follows a regimen prescribed by her gynecologist, treating the yeast infections every two weeks. This treatment is effective unless missed, which exacerbates her symptoms.  Currently, there are no symptoms of urinary tract infection such as dysuria, fever, or flank pain. Her symptoms are often confused with those of her chronic lymphocytic leukemia, such as abdominal pain.       Past Medical History:  Diagnosis Date   Bacteriuria 01/19/2024   Breast CA (HCC)    Bronchitis, chronic (HCC)    Compression fracture of body of thoracic vertebra (HCC)    Eye hemorrhage, left    Leukemia, chronic lymphoid (HCC)    Lymphomatoid papulosis    INCREASED RISK FOR LYMPHOMA   Osteoporosis 12/19/2021   severe   Recurrent UTI 01/19/2024    Past Surgical History:  Procedure Laterality Date   ABDOMINAL HYSTERECTOMY  1980   BREAST IMPLANTS REMOVED  2001   BREAST SURGERY     Bilateral mastectomy   IR RADIOLOGIST EVAL & MGMT  07/01/2019   MASTECTOMY  1982   BILATERAL   OOPHORECTOMY     BSO   PARTIAL COLECTOMY     INTESTINAL ABSCESS WITH SALPINGECTOMY   RECONSTRUCTION LEFT ELBOW  TONSILLECTOMY     UMBILLICAL HERNIA REPAIR      Family History  Problem Relation Age of Onset   Heart disease Mother    Heart disease Sister    Cancer Brother        HODGKINS   Cancer Brother        leukemia   Parkinson's disease Brother       Social History   Socioeconomic History   Marital status: Married    Spouse name: Not on file   Number of children: Not on file   Years  of education: Not on file   Highest education level: Not on file  Occupational History   Not on file  Tobacco Use   Smoking status: Former    Current packs/day: 0.00    Types: Cigarettes    Quit date: 03/26/1955    Years since quitting: 68.8   Smokeless tobacco: Never  Vaping Use   Vaping status: Never Used  Substance and Sexual Activity   Alcohol use: Not Currently   Drug use: Never   Sexual activity: Not Currently    Partners: Male    Birth control/protection: Surgical    Comment: HYST-1st intercourse 89 yo-More than 5 partners  Other Topics Concern   Not on file  Social History Narrative   Not on file   Social Drivers of Health   Financial Resource Strain: Low Risk  (05/17/2023)   Overall Financial Resource Strain (CARDIA)    Difficulty of Paying Living Expenses: Not hard at all  Food Insecurity: No Food Insecurity (09/03/2023)   Hunger Vital Sign    Worried About Running Out of Food in the Last Year: Never true    Ran Out of Food in the Last Year: Never true  Transportation Needs: No Transportation Needs (09/03/2023)   PRAPARE - Administrator, Civil Service (Medical): No    Lack of Transportation (Non-Medical): No  Physical Activity: Inactive (05/17/2023)   Exercise Vital Sign    Days of Exercise per Week: 0 days    Minutes of Exercise per Session: 0 min  Stress: No Stress Concern Present (05/17/2023)   [employer] of Occupational Health - Occupational Stress Questionnaire    Feeling of Stress : Not at all  Social Connections: Unknown (09/03/2023)   Social Connection and Isolation Panel    Frequency of Communication with Friends and Family: Three times a week    Frequency of Social Gatherings with Friends and Family: Patient declined    Attends Religious Services: Patient declined    Database administrator or Organizations: No    Attends Engineer, structural: Never    Marital Status: Married    Allergies  Allergen Reactions   Bactrim   [Sulfamethoxazole -Trimethoprim ]     Pt stated that it caused her to have body aches   Codeine Nausea Only   Doxycycline Dermatitis   Levofloxacin Rash     Current Outpatient Medications:    cephALEXin  (KEFLEX ) 500 MG capsule, Take 500 mg by mouth 2 (two) times daily., Disp: , Rfl:    hydrOXYzine  (VISTARIL ) 25 MG capsule, Take 1 capsule (25 mg total) by mouth at bedtime as needed., Disp: 30 capsule, Rfl: 0   Ibuprofen 200 MG CAPS, Take 200-600 mg by mouth every 6 (six) hours as needed (for pain, fever, or headaches)., Disp: , Rfl:    Prenat-Fe Carbonyl-FA-Omega 3 (ONE-A-DAY WOMENS PRENATAL 1) 28-0.8-235 MG CAPS, Take 1 tablet by mouth daily with breakfast., Disp: , Rfl:  Review of Systems  Constitutional:  Negative for activity change, appetite change, chills, diaphoresis, fatigue, fever and unexpected weight change.  HENT:  Negative for congestion, rhinorrhea, sinus pressure, sneezing, sore throat and trouble swallowing.   Eyes:  Negative for photophobia and visual disturbance.  Respiratory:  Negative for cough, chest tightness, shortness of breath, wheezing and stridor.   Cardiovascular:  Negative for chest pain, palpitations and leg swelling.  Gastrointestinal:  Positive for abdominal pain. Negative for abdominal distention, anal bleeding, blood in stool, constipation, diarrhea, nausea and vomiting.  Genitourinary:  Negative for difficulty urinating, dysuria, flank pain and hematuria.  Musculoskeletal:  Negative for arthralgias, back pain, gait problem, joint swelling and myalgias.  Skin:  Negative for color change, pallor, rash and wound.  Neurological:  Negative for dizziness, tremors, weakness and light-headedness.  Hematological:  Negative for adenopathy. Does not bruise/bleed easily.  Psychiatric/Behavioral:  Negative for agitation, behavioral problems, confusion, decreased concentration, dysphoric mood and sleep disturbance.        Objective:   Physical Exam Constitutional:       General: She is not in acute distress.    Appearance: Normal appearance. She is well-developed. She is not ill-appearing or diaphoretic.  HENT:     Head: Normocephalic and atraumatic.     Right Ear: Hearing and external ear normal.     Left Ear: Hearing and external ear normal.     Nose: No nasal deformity or rhinorrhea.   Eyes:     General: No scleral icterus.    Conjunctiva/sclera: Conjunctivae normal.     Right eye: Right conjunctiva is not injected.     Left eye: Left conjunctiva is not injected.     Pupils: Pupils are equal, round, and reactive to light.   Neck:     Vascular: No JVD.   Cardiovascular:     Rate and Rhythm: Normal rate and regular rhythm.     Heart sounds: S1 normal and S2 normal.     No friction rub.  Pulmonary:     Effort: Pulmonary effort is normal. No respiratory distress.     Breath sounds: No wheezing.  Abdominal:     General: Bowel sounds are normal. There is no distension.     Palpations: Abdomen is soft.     Tenderness: There is no abdominal tenderness.   Musculoskeletal:        General: Normal range of motion.     Right shoulder: Normal.     Left shoulder: Normal.     Cervical back: Normal range of motion and neck supple.     Right hip: Normal.     Left hip: Normal.     Right knee: Normal.     Left knee: Normal.  Lymphadenopathy:     Head:     Right side of head: No submandibular, preauricular or posterior auricular adenopathy.     Left side of head: No submandibular, preauricular or posterior auricular adenopathy.     Cervical: No cervical adenopathy.     Right cervical: No superficial or deep cervical adenopathy.    Left cervical: No superficial or deep cervical adenopathy.   Skin:    General: Skin is warm and dry.     Coloration: Skin is not pale.     Findings: No abrasion, bruising, ecchymosis, erythema, lesion or rash.     Nails: There is no clubbing.   Neurological:     Mental Status: She is alert and oriented to person,  place, and time.  Sensory: No sensory deficit.     Coordination: Coordination normal.     Gait: Gait normal.   Psychiatric:        Attention and Perception: She is attentive.        Mood and Affect: Mood normal.        Speech: Speech normal.        Behavior: Behavior normal. Behavior is cooperative.        Thought Content: Thought content normal.        Judgment: Judgment normal.           Assessment & Plan:   Assessment and Plan    Urinary retention Chronic urinary retention likely due to spinal cord injury.  She has chronic bacteruria and some chronic physical symptoms including abdominal pain that do NOT seem c/w UTI   I suspect the majority of her UTIs are not actual UTI's but just + urine cultures   I recommend restraint with antibiotics   I would recommend that the next time [facility] or someone else does a urine culture and tells her she needs antibiotics for her to message us  so we can ascertain if she ACTUALLY has UTI symptoms or not. if not avoiding antibiotics wil   --reduced the speed at which she acquires MDR ---reduce unwanted effects from antibiotics including yeast infections, C difficile     Recurrent yeast infections Recurrent vaginal yeast infections worsened by antibiotic use. Current treatment every two weeks with inconsistent adherence. - Continue pre-empitive treatment regimen every two weeks, though hopefully if she is getting less antibacterial abx she April have less frequent yeast infections  Chronic Lymphocytic Leukemia (CLL) Chronic lymphocytic leukemia with pain associated with increased blood count.      I have personally spent 62 minutes involved in face-to-face and non-face-to-face activities for this patient on the day of the visit. Professional time spent includes the following activities: Preparing to see the patient review of labs, imaging in Epic from clinics.  Obtaining and/or reviewing separately obtained history (notes from  Hematology/Oncology, Primary Care, hospital admissions.  Performing a medically appropriate examination and/or evaluation , Ordering medications/tests/procedures, referring and communicating with other health care professionals, Documenting clinical information in the EMR, Independently interpreting results (not separately reported), Communicating results to the patien, Counseling and educating the patient/family/caregiver and Care coordination (not separately reported).

## 2024-01-20 ENCOUNTER — Encounter: Payer: Self-pay | Admitting: Infectious Disease

## 2024-01-20 ENCOUNTER — Ambulatory Visit: Admitting: Infectious Disease

## 2024-01-20 ENCOUNTER — Other Ambulatory Visit: Payer: Self-pay

## 2024-01-20 ENCOUNTER — Telehealth: Payer: Self-pay

## 2024-01-20 VITALS — BP 145/85 | HR 98 | Temp 98.3°F | Wt 99.0 lb

## 2024-01-20 DIAGNOSIS — B379 Candidiasis, unspecified: Secondary | ICD-10-CM | POA: Diagnosis not present

## 2024-01-20 DIAGNOSIS — R339 Retention of urine, unspecified: Secondary | ICD-10-CM | POA: Diagnosis not present

## 2024-01-20 DIAGNOSIS — N390 Urinary tract infection, site not specified: Secondary | ICD-10-CM

## 2024-01-20 DIAGNOSIS — R8271 Bacteriuria: Secondary | ICD-10-CM

## 2024-01-20 DIAGNOSIS — C9110 Chronic lymphocytic leukemia of B-cell type not having achieved remission: Secondary | ICD-10-CM

## 2024-01-20 NOTE — Telephone Encounter (Signed)
 Patient is aware and Prolia  scheduled.

## 2024-01-20 NOTE — Telephone Encounter (Signed)
 Copied from CRM [id number]. Topic: General - Other >> Jan 20, 2024  9:59 AM Aleatha C wrote: Reason for CRM: Patient wants to know  when she is due for her Prolia  shot

## 2024-01-22 ENCOUNTER — Encounter: Payer: Self-pay | Admitting: Internal Medicine

## 2024-01-22 ENCOUNTER — Ambulatory Visit

## 2024-01-22 DIAGNOSIS — L509 Urticaria, unspecified: Secondary | ICD-10-CM

## 2024-01-23 MED ORDER — HYDROXYZINE PAMOATE 25 MG PO CAPS: 25.0000 mg | ORAL_CAPSULE | Freq: Every evening | ORAL | 0 refills | Status: DC | PRN

## 2024-01-29 ENCOUNTER — Ambulatory Visit

## 2024-01-29 DIAGNOSIS — M810 Age-related osteoporosis without current pathological fracture: Secondary | ICD-10-CM | POA: Diagnosis not present

## 2024-01-29 MED ORDER — DENOSUMAB 60 MG/ML ~~LOC~~ SOSY
60.0000 mg | PREFILLED_SYRINGE | Freq: Once | SUBCUTANEOUS | Status: AC
  Administered 2024-01-29: 60 mg via SUBCUTANEOUS

## 2024-01-29 NOTE — Progress Notes (Signed)
Per orders of Dr. Ardyth Harps, injection of Prolia given by Stann Ore. Patient tolerated injection well.

## 2024-02-21 ENCOUNTER — Other Ambulatory Visit: Payer: Self-pay | Admitting: Internal Medicine

## 2024-02-21 DIAGNOSIS — L509 Urticaria, unspecified: Secondary | ICD-10-CM

## 2024-03-24 DIAGNOSIS — H5213 Myopia, bilateral: Secondary | ICD-10-CM | POA: Diagnosis not present

## 2024-03-24 DIAGNOSIS — H353131 Nonexudative age-related macular degeneration, bilateral, early dry stage: Secondary | ICD-10-CM | POA: Diagnosis not present

## 2024-03-24 DIAGNOSIS — H04123 Dry eye syndrome of bilateral lacrimal glands: Secondary | ICD-10-CM | POA: Diagnosis not present

## 2024-03-24 DIAGNOSIS — H35372 Puckering of macula, left eye: Secondary | ICD-10-CM | POA: Diagnosis not present

## 2024-03-25 ENCOUNTER — Other Ambulatory Visit: Payer: Self-pay | Admitting: Internal Medicine

## 2024-03-25 DIAGNOSIS — L509 Urticaria, unspecified: Secondary | ICD-10-CM

## 2024-04-15 ENCOUNTER — Other Ambulatory Visit: Payer: Self-pay

## 2024-04-15 ENCOUNTER — Encounter: Payer: Self-pay | Admitting: Internal Medicine

## 2024-04-15 DIAGNOSIS — M810 Age-related osteoporosis without current pathological fracture: Secondary | ICD-10-CM

## 2024-04-15 MED ORDER — DENOSUMAB 60 MG/ML ~~LOC~~ SOSY
60.0000 mg | PREFILLED_SYRINGE | SUBCUTANEOUS | Status: AC
  Administered 2024-08-13: 60 mg via SUBCUTANEOUS

## 2024-04-15 NOTE — Progress Notes (Signed)
 Pt on Bone Density report. Order placed for PA.

## 2024-04-22 ENCOUNTER — Ambulatory Visit: Admitting: Infectious Disease

## 2024-04-23 ENCOUNTER — Encounter: Payer: Self-pay | Admitting: Radiology

## 2024-04-23 ENCOUNTER — Ambulatory Visit: Admitting: Radiology

## 2024-04-23 VITALS — BP 152/76 | HR 84 | Temp 97.9°F | Wt 97.0 lb

## 2024-04-23 DIAGNOSIS — R102 Pelvic and perineal pain: Secondary | ICD-10-CM

## 2024-04-23 DIAGNOSIS — N898 Other specified noninflammatory disorders of vagina: Secondary | ICD-10-CM

## 2024-04-23 DIAGNOSIS — N958 Other specified menopausal and perimenopausal disorders: Secondary | ICD-10-CM | POA: Diagnosis not present

## 2024-04-23 LAB — URINALYSIS, COMPLETE W/RFL CULTURE
Bacteria, UA: NONE SEEN /HPF
Bilirubin Urine: NEGATIVE
Glucose, UA: NEGATIVE
Hgb urine dipstick: NEGATIVE
Hyaline Cast: NONE SEEN /LPF
Ketones, ur: NEGATIVE
Leukocyte Esterase: NEGATIVE
Nitrites, Initial: NEGATIVE
Protein, ur: NEGATIVE
RBC / HPF: NONE SEEN /HPF (ref 0–2)
Specific Gravity, Urine: 1.015 (ref 1.001–1.035)
WBC, UA: NONE SEEN /HPF (ref 0–5)
pH: 6 (ref 5.0–8.0)

## 2024-04-23 LAB — WET PREP FOR TRICH, YEAST, CLUE

## 2024-04-23 LAB — NO CULTURE INDICATED

## 2024-04-23 MED ORDER — ESTRADIOL 0.1 MG/GM VA CREA: 1.0000 g | TOPICAL_CREAM | VAGINAL | 3 refills | Status: AC

## 2024-04-23 NOTE — Progress Notes (Signed)
      Subjective: April [name] is a 89 y.o. female who complains of lower aching pelvic pain, worse in lower vagina, some increased vaginal discharge, no odor, no itching, denies urinary complaints. Symptoms x's 1 week.    Review of Systems  All other systems reviewed and are negative.   Past Medical History:  Diagnosis Date   Bacteriuria 01/19/2024   Breast CA (HCC)    Bronchitis, chronic (HCC)    Compression fracture of body of thoracic vertebra (HCC)    Eye hemorrhage, left    Leukemia, chronic lymphoid (HCC)    Lymphomatoid papulosis    INCREASED RISK FOR LYMPHOMA   Osteoporosis 12/19/2021   severe   Recurrent UTI 01/19/2024      Objective:  Today's Vitals   04/23/24 1505 04/23/24 1511  BP: (!) 164/80 (!) 152/76  Pulse: 84   Temp: 97.9 F (36.6 C)   TempSrc: Oral   SpO2: 99%   Weight: 97 lb (44 kg)    Body mass index is 20.27 kg/m.   -General: no acute distress -Vulva: without lesions or discharge -Vagina: scant clear discharge present, wet prep obtained -Perineum: no lesions -Abdomen: SNT -CVAT: negative   Urine dipstick shows negative for all components.  Micro exam: negative for WBC's or RBC's.  Microscopic wet-mount exam shows negative for pathogens, normal epithelial cells. SABRA Darice Hoit, CMA  Assessment:/Plan:  1. Pelvic pain (Primary) - Urinalysis,Complete w/RFL Culture  2. Vaginal discharge - WET PREP FOR TRICH, YEAST, CLUE   3. Genitourinary syndrome of menopause - estradiol  (ESTRACE  VAGINAL) 0.1 MG/GM vaginal cream; Place 1 g vaginally 3 (three) times a week.  Dispense: 42.5 g; Refill: 3    Avoid the use of soaps or perfumed products in the peri area. Avoid tub baths and sitting in sweaty or wet clothing for prolonged periods of time.    Kenidee Cregan, [place] [phone]

## 2024-04-27 ENCOUNTER — Encounter: Payer: Self-pay | Admitting: Internal Medicine

## 2024-04-28 ENCOUNTER — Inpatient Hospital Stay: Attending: Hematology and Oncology

## 2024-04-28 ENCOUNTER — Inpatient Hospital Stay: Admitting: Hematology and Oncology

## 2024-04-28 ENCOUNTER — Encounter: Payer: Self-pay | Admitting: Hematology and Oncology

## 2024-04-28 ENCOUNTER — Other Ambulatory Visit (HOSPITAL_BASED_OUTPATIENT_CLINIC_OR_DEPARTMENT_OTHER): Payer: Self-pay

## 2024-04-28 VITALS — BP 171/73 | HR 76 | Resp 18 | Ht <= 58 in | Wt 98.2 lb

## 2024-04-28 DIAGNOSIS — C9110 Chronic lymphocytic leukemia of B-cell type not having achieved remission: Secondary | ICD-10-CM | POA: Diagnosis not present

## 2024-04-28 DIAGNOSIS — N390 Urinary tract infection, site not specified: Secondary | ICD-10-CM | POA: Diagnosis not present

## 2024-04-28 LAB — CBC WITH DIFFERENTIAL/PLATELET
Abs Immature Granulocytes: 0.04 K/uL (ref 0.00–0.07)
Basophils Absolute: 0.1 K/uL (ref 0.0–0.1)
Basophils Relative: 0 %
Eosinophils Absolute: 0.1 K/uL (ref 0.0–0.5)
Eosinophils Relative: 0 %
HCT: 37.6 % (ref 36.0–46.0)
Hemoglobin: 12.3 g/dL (ref 12.0–15.0)
Immature Granulocytes: 0 %
Lymphocytes Relative: 90 %
Lymphs Abs: 42.8 K/uL — ABNORMAL HIGH (ref 0.7–4.0)
MCH: 33.6 pg (ref 26.0–34.0)
MCHC: 32.7 g/dL (ref 30.0–36.0)
MCV: 102.7 fL — ABNORMAL HIGH (ref 80.0–100.0)
Monocytes Absolute: 2.9 K/uL — ABNORMAL HIGH (ref 0.1–1.0)
Monocytes Relative: 6 %
Neutro Abs: 1.8 K/uL (ref 1.7–7.7)
Neutrophils Relative %: 4 %
Platelets: 78 K/uL — ABNORMAL LOW (ref 150–400)
RBC: 3.66 MIL/uL — ABNORMAL LOW (ref 3.87–5.11)
RDW: 13.5 % (ref 11.5–15.5)
WBC: 47.8 K/uL — ABNORMAL HIGH (ref 4.0–10.5)
nRBC: 0.1 % (ref 0.0–0.2)

## 2024-04-28 MED ORDER — FLUZONE HIGH-DOSE 0.5 ML IM SUSY
0.5000 mL | PREFILLED_SYRINGE | Freq: Once | INTRAMUSCULAR | 0 refills | Status: AC
  Filled 2024-04-28: qty 0.5, 1d supply, fill #0

## 2024-04-28 NOTE — Assessment & Plan Note (Addendum)
 She was diagnosed with CLL in 2010 Flow cytometry confirmed diagnosis of CLL, FISH analysis came back positive for deletion 13 q. Only The patient received briefly treatment with ibrutinib  in 2020 and treatment was subsequently switched to Bendamustine  only.  She tolerated chemotherapy very poorly and treatment was subsequently stopped  She had recurrent CLL again but CT imaging from 2022 showed no evidence of lymphadenopathy  Overall, her chronic leukocytosis is stable Her platelet count is low intermittently She is not anemic Overall, she has slight progression of CLL but not at the point she needs treatment I plan to see her back in 6 months We discussed importance of annual influenza vaccination

## 2024-04-28 NOTE — Progress Notes (Signed)
 [facility] OFFICE PROGRESS NOTE  Patient Care Team: Theophilus Andrews, Tully GRADE, MD as PCP - General (Internal Medicine) [place] Ronal SQUIBB, NP (Inactive) as Nurse Practitioner (Hospice and Palliative Medicine)  Assessment & Plan CLL (chronic lymphocytic leukemia) (HCC) She was diagnosed with CLL in 2010 Flow cytometry confirmed diagnosis of CLL, FISH analysis came back positive for deletion 13 q. Only The patient received briefly treatment with ibrutinib  in 2020 and treatment was subsequently switched to Bendamustine  only.  She tolerated chemotherapy very poorly and treatment was subsequently stopped  She had recurrent CLL again but CT imaging from 2022 showed no evidence of lymphadenopathy  Overall, her chronic leukocytosis is stable Her platelet count is low intermittently She is not anemic Overall, she has slight progression of CLL but not at the point she needs treatment I plan to see her back in 6 months We discussed importance of annual influenza vaccination  No orders of the defined types were placed in this encounter.    Almarie Bedford, MD  INTERVAL HISTORY: she returns for surveillance follow-up for CLL She is doing well overall No recent bleeding She has chronic intermittent urinary tract infection  PHYSICAL EXAMINATION: ECOG PERFORMANCE STATUS: 1 - Symptomatic but completely ambulatory  Vitals:   04/28/24 1217  BP: (!) 171/73  Pulse: 76  Resp: 18  SpO2: 98%   Filed Weights   04/28/24 1217  Weight: 98 lb 3.2 oz (44.5 kg)    Relevant data reviewed during this visit included CBC Oncology History Overview Note  Del 13 q   CLL (chronic lymphocytic leukemia) (HCC)  03/15/2009 Pathology Results   Case #: [id number]  flow cytometry of peripheral blood comfirmed CLL.   04/25/2017 Pathology Results   FISH panel is positive for deletion 13 q only   04/22/2019 Imaging   Ct scan of the chest, abdomen and pelvis 1. No adenopathy within the chest, abdomen or  pelvis. 2. Splenomegaly with scattered low-density lesions measuring up to 1.3 cm. 3. Small subpleural nodule in the left lower lobe and right middle lobe measuring up to 5 mm. No follow-up needed if patient is low-risk (and has no known or suspected primary neoplasm). Non-contrast chest CT can be considered in 12 months if patient is high-risk. This recommendation follows the consensus statement: Guidelines for Management of Incidental Pulmonary Nodules Detected on CT Images: From the Fleischner Society 2017; Radiology 2017; 284:228-243. 4. Aortic Atherosclerosis (ICD10-I70.0). Coronary artery atherosclerotic calcifications.     04/23/2019 Cancer Staging   Staging form: Chronic Lymphocytic Leukemia / Small Lymphocytic Lymphoma, AJCC 8th Edition - Clinical stage from 04/23/2019: Modified Rai Stage IV (Modified Rai risk: High, Lymphocytosis: Present, Adenopathy: Absent, Organomegaly: Present, Anemia: Absent, Thrombocytopenia: Present) - Signed by Bedford Almarie, MD on 04/23/2019   04/28/2019 -  Chemotherapy   The patient had Ibrutinib  for chemotherapy treatment.     06/01/2019 -  Chemotherapy   The patient had Bendamustine  for chemotherapy   06/02/2019 Imaging   Ct head, chest, abdomen and pelvis 1. New T8 compression deformity with approximately 30% height loss. Worsening anterior compression deformity of the T11 vertebrae now with 20% height loss anteriorly. Additional T12 and L2 compression deformities are stable from prior. 2. No other acute traumatic injury within the chest, abdomen, or pelvis. Specifically, no splenic injury or retroperitoneal hematoma. 3. Mild intra and extrahepatic biliary ductal dilatation with a small calcified gallstone seen in the distal common bile duct just proximal to the ampulla of Vater. Correlate with LFTs and  consider further evaluation with MRCP as clinically indicated. 4. Stable sub 5 mm nodules in the right middle and left lower lobe. No further evaluation is  required if patient is low risk however should continue with previously recommended 12 month follow-up in September of 2021 if patient is high risk. 5. Severe scoliotic curvature of the thoracolumbar spine with associated chest wall deformity. 6. Duodenal and colonic diverticula without features of diverticulitis. 7. Moderate bladder distention, correlate for features of outlet obstruction or retention. 8. Aortic Atherosclerosis (ICD10-I70.0).   06/07/2019 - 06/10/2019 Hospital Admission   She was admitted for management of LUQ and back pain   06/08/2019 Imaging   MRI abdomen 1. Mild intra and extrahepatic biliary duct prominence with common bile duct measuring upper normal for patient age. No gallstones evident and no definite choledocholithiasis. Somewhat abrupt transition of the duct into the ampulla noted, but nonspecific. ERCP could be used to further evaluate for ampullary lesion as clinically warranted. 2. Hepatic and renal cysts. 3. Presumed marked distention of the urinary bladder.   06/08/2019 Imaging   Ct angiogram No gross evidence of large or central pulmonary emboli is identified on exam limited secondary to suboptimal pulmonary arterial opacification and respiratory motion.   Bibasilar atelectasis.   Osseous demineralization with compression fractures of several thoracic vertebra.   Aneurysmal dilatation ascending thoracic aorta 4.1 cm transverse, recommendation below.   Recommend annual imaging followup by CTA or MRA.   Aortic Atherosclerosis (ICD10-I70.0).   Aortic aneurysm NOS   10/09/2019 Imaging   1. No definite acute CT findings of the abdomen or pelvis to explain pain. 2. Very distended urinary bladder.  Correlate for urinary retention. 3. Large burden of stool in the distal colon and rectum. Correlate for constipation. 4. Unchanged mild intrahepatic biliary ductal dilatation. Common bile duct normal in caliber measuring up to 6 mm. No obstructing lesion or calculi  identified. 5. No mass or lymphadenopathy in the abdomen or pelvis. 6. Aortic Atherosclerosis (ICD10-I70.0).

## 2024-05-11 DIAGNOSIS — L308 Other specified dermatitis: Secondary | ICD-10-CM | POA: Diagnosis not present

## 2024-05-11 DIAGNOSIS — L814 Other melanin hyperpigmentation: Secondary | ICD-10-CM | POA: Diagnosis not present

## 2024-05-11 DIAGNOSIS — L821 Other seborrheic keratosis: Secondary | ICD-10-CM | POA: Diagnosis not present

## 2024-05-11 DIAGNOSIS — D1801 Hemangioma of skin and subcutaneous tissue: Secondary | ICD-10-CM | POA: Diagnosis not present

## 2024-05-11 DIAGNOSIS — L817 Pigmented purpuric dermatosis: Secondary | ICD-10-CM | POA: Diagnosis not present

## 2024-05-11 DIAGNOSIS — L853 Xerosis cutis: Secondary | ICD-10-CM | POA: Diagnosis not present

## 2024-05-11 DIAGNOSIS — Z85828 Personal history of other malignant neoplasm of skin: Secondary | ICD-10-CM | POA: Diagnosis not present

## 2024-05-12 ENCOUNTER — Other Ambulatory Visit (HOSPITAL_BASED_OUTPATIENT_CLINIC_OR_DEPARTMENT_OTHER): Payer: Self-pay

## 2024-05-12 MED ORDER — COMIRNATY 30 MCG/0.3ML IM SUSY
0.3000 mL | PREFILLED_SYRINGE | Freq: Once | INTRAMUSCULAR | 0 refills | Status: AC
  Filled 2024-05-12: qty 0.3, 1d supply, fill #0

## 2024-05-13 DIAGNOSIS — R3914 Feeling of incomplete bladder emptying: Secondary | ICD-10-CM | POA: Diagnosis not present

## 2024-05-13 DIAGNOSIS — R399 Unspecified symptoms and signs involving the genitourinary system: Secondary | ICD-10-CM | POA: Diagnosis not present

## 2024-05-13 DIAGNOSIS — N3020 Other chronic cystitis without hematuria: Secondary | ICD-10-CM | POA: Diagnosis not present

## 2024-05-23 ENCOUNTER — Encounter: Payer: Self-pay | Admitting: Internal Medicine

## 2024-05-26 ENCOUNTER — Ambulatory Visit: Admitting: Family Medicine

## 2024-05-26 DIAGNOSIS — I10 Essential (primary) hypertension: Secondary | ICD-10-CM | POA: Diagnosis not present

## 2024-05-26 DIAGNOSIS — Z853 Personal history of malignant neoplasm of breast: Secondary | ICD-10-CM | POA: Diagnosis not present

## 2024-05-26 DIAGNOSIS — C9590 Leukemia, unspecified not having achieved remission: Secondary | ICD-10-CM | POA: Diagnosis not present

## 2024-05-26 DIAGNOSIS — Z8249 Family history of ischemic heart disease and other diseases of the circulatory system: Secondary | ICD-10-CM | POA: Diagnosis not present

## 2024-05-26 DIAGNOSIS — Z791 Long term (current) use of non-steroidal anti-inflammatories (NSAID): Secondary | ICD-10-CM | POA: Diagnosis not present

## 2024-05-26 DIAGNOSIS — C8590 Non-Hodgkin lymphoma, unspecified, unspecified site: Secondary | ICD-10-CM | POA: Diagnosis not present

## 2024-05-26 DIAGNOSIS — N959 Unspecified menopausal and perimenopausal disorder: Secondary | ICD-10-CM | POA: Diagnosis not present

## 2024-05-26 DIAGNOSIS — Z85828 Personal history of other malignant neoplasm of skin: Secondary | ICD-10-CM | POA: Diagnosis not present

## 2024-05-26 DIAGNOSIS — M810 Age-related osteoporosis without current pathological fracture: Secondary | ICD-10-CM | POA: Diagnosis not present

## 2024-05-28 ENCOUNTER — Telehealth: Payer: Self-pay

## 2024-06-16 ENCOUNTER — Other Ambulatory Visit (HOSPITAL_BASED_OUTPATIENT_CLINIC_OR_DEPARTMENT_OTHER): Payer: Self-pay

## 2024-06-16 MED ORDER — SHINGRIX 50 MCG/0.5ML IM SUSR
0.5000 mL | Freq: Once | INTRAMUSCULAR | 0 refills | Status: AC
  Filled 2024-06-16: qty 0.5, 1d supply, fill #0

## 2024-06-22 ENCOUNTER — Ambulatory Visit (INDEPENDENT_AMBULATORY_CARE_PROVIDER_SITE_OTHER)

## 2024-06-22 VITALS — Ht <= 58 in | Wt 98.0 lb

## 2024-06-22 DIAGNOSIS — Z0000 Encounter for general adult medical examination without abnormal findings: Secondary | ICD-10-CM | POA: Diagnosis not present

## 2024-06-22 NOTE — Progress Notes (Signed)
 Chief Complaint  Patient presents with   Medicare Wellness     Subjective:   April [name] is a 89 y.o. female who presents for a Medicare Annual Wellness Visit.  Allergies (verified) Bactrim  [sulfamethoxazole -trimethoprim ], Codeine, Doxycycline, and Levofloxacin   History: Past Medical History:  Diagnosis Date   Bacteriuria 01/19/2024   Breast CA (HCC)    Bronchitis, chronic (HCC)    Compression fracture of body of thoracic vertebra (HCC)    Eye hemorrhage, left    Leukemia, chronic lymphoid (HCC)    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HCC)    INCREASED RISK FOR LYMPHOMA   Osteoporosis 12/19/2021   severe   Recurrent UTI 01/19/2024   Past Surgical History:  Procedure Laterality Date   ABDOMINAL HYSTERECTOMY  07/30/1978   BILATERAL TOTAL MASTECTOMY WITH AXILLARY LYMPH NODE DISSECTION Bilateral    BREAST IMPLANTS REMOVED  07/31/1999   BREAST SURGERY     Bilateral mastectomy   IR RADIOLOGIST EVAL & MGMT  07/01/2019   MASTECTOMY  07/30/1980   BILATERAL   OOPHORECTOMY     BSO   PARTIAL COLECTOMY     INTESTINAL ABSCESS WITH SALPINGECTOMY   RECONSTRUCTION LEFT ELBOW     TONSILLECTOMY     UMBILLICAL HERNIA REPAIR     Family History  Problem Relation Age of Onset   Heart disease Mother    Heart disease Sister    Cancer Brother        HODGKINS   Cancer Brother        leukemia   Parkinson's disease Brother    Social History   Occupational History   Occupation: RETIRED  Tobacco Use   Smoking status: Former    Current packs/day: 0.00    Types: Cigarettes    Quit date: 03/26/1955    Years since quitting: 69.2   Smokeless tobacco: Never  Vaping Use   Vaping status: Never Used  Substance and Sexual Activity   Alcohol use: Not Currently   Drug use: Never   Sexual activity: Not Currently    Partners: Male    Birth control/protection: Surgical    Comment: HYST-1st intercourse 89 yo-More than 5 partners   Tobacco Counseling Counseling given: Not Answered  SDOH  Screenings   Food Insecurity: No Food Insecurity (06/22/2024)  Housing: Low Risk  (06/22/2024)  Transportation Needs: No Transportation Needs (06/22/2024)  Utilities: Not At Risk (06/22/2024)  Alcohol Screen: Low Risk  (06/22/2024)  Depression (PHQ2-9): Low Risk  (06/22/2024)  Financial Resource Strain: Low Risk  (06/22/2024)  Physical Activity: Insufficiently Active (06/22/2024)  Social Connections: Moderately Isolated (06/22/2024)  Stress: No Stress Concern Present (06/22/2024)  Tobacco Use: Medium Risk (06/22/2024)  Health Literacy: Adequate Health Literacy (06/22/2024)   See flowsheets for full screening details  Depression Screen PHQ 2 & 9 Depression Scale- Over the past 2 weeks, how often have you been bothered by any of the following problems? Little interest or pleasure in doing things: 0 Feeling down, depressed, or hopeless (PHQ Adolescent also includes...irritable): 0 PHQ-2 Total Score: 0 Trouble falling or staying asleep, or sleeping too much: 0 Feeling tired or having little energy: 0 Poor appetite or overeating (PHQ Adolescent also includes...weight loss): 0 Feeling bad about yourself - or that you are a failure or have let yourself or your family down: 0 Trouble concentrating on things, such as reading the newspaper or watching television (PHQ Adolescent also includes...like school work): 0 Moving or speaking so slowly that other people could have noticed. Or the opposite -  being so fidgety or restless that you have been moving around a lot more than usual: 0 Thoughts that you would be better off dead, or of hurting yourself in some way: 0 PHQ-9 Total Score: 0 If you checked off any problems, how difficult have these problems made it for you to do your work, take care of things at home, or get along with other people?: Not difficult at all  Depression Treatment Depression Interventions/Treatment : EYV7-0 Score <4 Follow-up Not Indicated     Goals Addressed                This Visit's Progress     Stay active (pt-stated)   On track     Take care of my 77 yr old husband.       Visit info / Clinical Intake: Medicare Wellness Visit Type:: Subsequent Annual Wellness Visit Persons participating in visit:: patient Medicare Wellness Visit Mode:: Video Because this visit was a virtual/telehealth visit:: vitals recorded from last visit If Telephone or Video please confirm:: I connected with the patient using audio enabled telemedicine application and verified that I am speaking with the correct person using two identifiers; I discussed the limitations of evaluation and management by telemedicine Patient Location:: Home Provider Location:: Home Information given by:: patient Interpreter Needed?: No Pre-visit prep was completed: yes AWV questionnaire completed by patient prior to visit?: yes Date:: 06/22/24 Living arrangements:: lives with spouse/significant other Patient's Overall Health Status Rating: excellent Typical amount of pain: none Does pain affect daily life?: no Are you currently prescribed opioids?: no  Dietary Habits and Nutritional Risks How many meals a day?: 3 Eats fruit and vegetables daily?: yes Most meals are obtained by: preparing own meals In the last 2 weeks, have you had any of the following?: none Diabetic:: no  Functional Status Activities of Daily Living (to include ambulation/medication): (Patient-Rptd) Independent Ambulation: Independent with device- listed below Home Assistive Devices/Equipment: Eyeglasses Medication Administration: Independent Home Management: (Patient-Rptd) Independent Manage your own finances?: yes Primary transportation is: driving Concerns about vision?: no *vision screening is required for WTM* Concerns about hearing?: no  Fall Screening Falls in the past year?: (Patient-Rptd) 0 Number of falls in past year: (Patient-Rptd) 0 Was there an injury with Fall?: 0 Fall Risk Category  Calculator: 0 Patient Fall Risk Level: Low Fall Risk  Fall Risk Patient at Risk for Falls Due to: No Fall Risks Fall risk Follow up: Falls evaluation completed; Falls prevention discussed  Home and Transportation Safety: All rugs have non-skid backing?: yes All stairs or steps have railings?: yes Grab bars in the bathtub or shower?: yes Have non-skid surface in bathtub or shower?: yes Good home lighting?: yes Regular seat belt use?: yes Hospital stays in the last year:: no  Cognitive Assessment Difficulty concentrating, remembering, or making decisions? : no Will 6CIT or Mini Cog be Completed: no 6CIT or Mini Cog Declined: patient alert, oriented, able to answer questions appropriately and recall recent events  Advance Directives (For Healthcare) Does Patient Have a Medical Advance Directive?: Yes Type of Advance Directive: Healthcare Power of [place]; Living will Copy of Healthcare Power of Attorney in Chart?: No - copy requested Copy of Living Will in Chart?: No - copy requested Would patient like information on creating a medical advance directive?: No - Patient declined  Reviewed/Updated  Reviewed/Updated: Reviewed All (Medical, Surgical, Family, Medications, Allergies, Care Teams, Patient Goals)        Objective:    Today's Vitals   06/22/24 1503  Weight: 98 lb (44.5 kg)  Height: 4' 10 (1.473 m)   Body mass index is 20.48 kg/m.  Current Medications (verified) Outpatient Encounter Medications as of 06/22/2024  Medication Sig   estradiol  (ESTRACE  VAGINAL) 0.1 MG/GM vaginal cream Place 1 g vaginally 3 (three) times a week.   hydrOXYzine  (VISTARIL ) 25 MG capsule TAKE 1 CAPSULE (25 MG TOTAL) BY MOUTH AT BEDTIME AS NEEDED.   Ibuprofen 200 MG CAPS Take 200-600 mg by mouth every 6 (six) hours as needed (for pain, fever, or headaches).   Prenat-Fe Carbonyl-FA-Omega 3 (ONE-A-DAY WOMENS PRENATAL 1) 28-0.8-235 MG CAPS Take 1 tablet by mouth daily with breakfast.    cephALEXin  (KEFLEX ) 500 MG capsule Take 500 mg by mouth 2 (two) times daily. (Patient not taking: Reported on 06/22/2024)   Facility-Administered Encounter Medications as of 06/22/2024  Medication   [START ON 07/31/2024] denosumab  (PROLIA ) injection 60 mg   Hearing/Vision screen Hearing Screening - Comments:: Denies hearing difficulties   Vision Screening - Comments:: Wears eyeglasses for reading/UTD/[facility] Immunizations and Health Maintenance Health Maintenance  Topic Date Due   Mammogram  Never done   DTaP/Tdap/Td (2 - Tdap) 02/18/2019   Medicare Annual Wellness (AWV)  05/16/2024   Zoster Vaccines- Shingrix  (2 of 2) 08/11/2024   COVID-19 Vaccine (9 - Pfizer risk 2025-26 season) 11/10/2024   Pneumococcal Vaccine: 50+ Years  Completed   Influenza Vaccine  Completed   Bone Density Scan  Completed   Meningococcal B Vaccine  Aged Out        Assessment/Plan:  This is a routine wellness examination for Karrah.  Patient Care Team: Theophilus Andrews, Tully GRADE, MD as PCP - General (Internal Medicine) [place] Ronal SQUIBB, NP (Inactive) as Nurse Practitioner [facility], [facility]  I have personally reviewed and noted the following in the patient's chart:   Medical and social history Use of alcohol, tobacco or illicit drugs  Current medications and supplements including opioid prescriptions. Functional ability and status Nutritional status Physical activity Advanced directives List of other physicians Hospitalizations, surgeries, and ER visits in previous 12 months Vitals Screenings to include cognitive, depression, and falls Referrals and appointments  No orders of the defined types were placed in this encounter.  In addition, I have reviewed and discussed with patient certain preventive protocols, quality metrics, and best practice recommendations. A written personalized care plan for preventive services as well as general  preventive health recommendations were provided to patient.   Kelvin Sennett L Attie Nawabi, CMA   06/22/2024   No follow-ups on file.  After Visit Summary: (Mail) Due to this being a telephonic visit, the after visit summary with patients personalized plan was offered to patient via mail   Nurse Notes: Patient is due for a Tdap.  Patient is up to date on all other health maintenance with no concerns to address today.

## 2024-06-22 NOTE — Patient Instructions (Addendum)
 [name],  Thank you for taking the time for your Medicare Wellness Visit. I appreciate your continued commitment to your health goals. Please review the care plan we discussed, and feel free to reach out if I can assist you further.  Please note that Annual Wellness Visits do not include a physical exam. Some assessments may be limited, especially if the visit was conducted virtually. If needed, we may recommend an in-person follow-up with your provider.  Ongoing Care Seeing your primary care provider every 3 to 6 months helps us  monitor your health and provide consistent, personalized care. Next office visit on 08/06/2024.  You are due for a Tetanus vaccine and can get that done at your local pharmacy. Keep up the good work.  Referrals If a referral was made during today's visit and you haven't received any updates within two weeks, please contact the referred provider directly to check on the status.  Recommended Screenings:  Health Maintenance  Topic Date Due   Breast Cancer Screening  Never done   DTaP/Tdap/Td vaccine (2 - Tdap) 02/18/2019   Zoster (Shingles) Vaccine (2 of 2) 08/11/2024   COVID-19 Vaccine (9 - Pfizer risk 2025-26 season) 11/10/2024   Medicare Annual Wellness Visit  06/22/2025   Pneumococcal Vaccine for age over 80  Completed   Flu Shot  Completed   Osteoporosis screening with Bone Density Scan  Completed   Meningitis B Vaccine  Aged Out       06/22/2024    2:47 PM  Advanced Directives  Does Patient Have a Medical Advance Directive? Yes  Type of Estate Agent of [place];Living will    Vision: Annual vision screenings are recommended for early detection of glaucoma, cataracts, and diabetic retinopathy. These exams can also reveal signs of chronic conditions such as diabetes and high blood pressure.  Dental: Annual dental screenings help detect early signs of oral cancer, gum disease, and other conditions linked to overall health,  including heart disease and diabetes.  Please see the attached documents for additional preventive care recommendations.

## 2024-07-31 ENCOUNTER — Telehealth: Payer: Self-pay

## 2024-07-31 ENCOUNTER — Other Ambulatory Visit (HOSPITAL_COMMUNITY): Payer: Self-pay

## 2024-07-31 NOTE — Telephone Encounter (Signed)
 Prolia  VOB initiated via [email or web address]  Next Prolia  inj DUE: NOW   PHARMACY COPAY: $64

## 2024-08-06 ENCOUNTER — Encounter: Payer: Self-pay | Admitting: Internal Medicine

## 2024-08-06 ENCOUNTER — Ambulatory Visit (INDEPENDENT_AMBULATORY_CARE_PROVIDER_SITE_OTHER): Admitting: Internal Medicine

## 2024-08-06 VITALS — BP 110/70 | HR 88 | Temp 97.4°F | Ht <= 58 in | Wt 96.7 lb

## 2024-08-06 DIAGNOSIS — E559 Vitamin D deficiency, unspecified: Secondary | ICD-10-CM

## 2024-08-06 DIAGNOSIS — R748 Abnormal levels of other serum enzymes: Secondary | ICD-10-CM | POA: Diagnosis not present

## 2024-08-06 DIAGNOSIS — I10 Essential (primary) hypertension: Secondary | ICD-10-CM

## 2024-08-06 DIAGNOSIS — Z853 Personal history of malignant neoplasm of breast: Secondary | ICD-10-CM | POA: Diagnosis not present

## 2024-08-06 DIAGNOSIS — Z0000 Encounter for general adult medical examination without abnormal findings: Secondary | ICD-10-CM

## 2024-08-06 NOTE — Progress Notes (Signed)
 "    Established Patient Office Visit     CC/Reason for Visit: Annual preventive exam  HPI: April [name] is a 89 y.o. female who is coming in today for the above mentioned reasons. Past Medical History is significant for: Breast cancer, CLL, osteoporosis, recurrent UTIs with intermittent self-catheterization.  She is feeling well without concerns or complaints.  Is due for Tdap and her second shingles vaccine.  No longer does cancer screening due to her age.   Past Medical/Surgical History: Past Medical History:  Diagnosis Date   Bacteriuria 01/19/2024   Breast CA (HCC)    Bronchitis, chronic (HCC)    Compression fracture of body of thoracic vertebra (HCC)    Eye hemorrhage, left    Leukemia, chronic lymphoid (HCC)    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HCC)    INCREASED RISK FOR LYMPHOMA   Osteoporosis 12/19/2021   severe   Recurrent UTI 01/19/2024    Past Surgical History:  Procedure Laterality Date   ABDOMINAL HYSTERECTOMY  07/30/1978   BILATERAL TOTAL MASTECTOMY WITH AXILLARY LYMPH NODE DISSECTION Bilateral    BREAST IMPLANTS REMOVED  07/31/1999   BREAST SURGERY     Bilateral mastectomy   IR RADIOLOGIST EVAL & MGMT  07/01/2019   MASTECTOMY  07/30/1980   BILATERAL   OOPHORECTOMY     BSO   PARTIAL COLECTOMY     INTESTINAL ABSCESS WITH SALPINGECTOMY   RECONSTRUCTION LEFT ELBOW     TONSILLECTOMY     UMBILLICAL HERNIA REPAIR      Social History:  reports that she quit smoking about 69 years ago. Her smoking use included cigarettes. She has never used smokeless tobacco. She reports that she does not currently use alcohol. She reports that she does not use drugs.  Allergies: Allergies[1]  Family History:  Family History  Problem Relation Age of Onset   Heart disease Mother    Heart disease Sister    Cancer Brother        HODGKINS   Cancer Brother        leukemia   Parkinson's disease Brother     Current Medications[2]  Review of Systems:  Negative unless  indicated in HPI.   Physical Exam: Vitals:   08/06/24 1527  BP: 110/70  Pulse: 88  Temp: (!) 97.4 F (36.3 C)  TempSrc: Oral  SpO2: 96%  Weight: 96 lb 11.2 oz (43.9 kg)  Height: 4' 9.5 (1.461 m)    Body mass index is 20.56 kg/m.   Physical Exam Vitals reviewed.  Constitutional:      General: She is not in acute distress.    Appearance: Normal appearance. She is not ill-appearing, toxic-appearing or diaphoretic.  HENT:     Head: Normocephalic.     Right Ear: Tympanic membrane, ear canal and external ear normal. There is no impacted cerumen.     Left Ear: Tympanic membrane, ear canal and external ear normal. There is no impacted cerumen.     Nose: Nose normal.     Mouth/Throat:     Mouth: Mucous membranes are moist.     Pharynx: Oropharynx is clear. No oropharyngeal exudate or posterior oropharyngeal erythema.  Eyes:     General: No scleral icterus.       Right eye: No discharge.        Left eye: No discharge.     Conjunctiva/sclera: Conjunctivae normal.     Pupils: Pupils are equal, round, and reactive to light.  Neck:     Vascular: No carotid  bruit.  Cardiovascular:     Rate and Rhythm: Normal rate and regular rhythm.     Pulses: Normal pulses.     Heart sounds: Normal heart sounds.  Pulmonary:     Effort: Pulmonary effort is normal. No respiratory distress.     Breath sounds: Normal breath sounds.  Abdominal:     General: Abdomen is flat. Bowel sounds are normal.     Palpations: Abdomen is soft.  Musculoskeletal:        General: Normal range of motion.     Cervical back: Normal range of motion.  Skin:    General: Skin is warm and dry.  Neurological:     General: No focal deficit present.     Mental Status: She is alert and oriented to person, place, and time. Mental status is at baseline.  Psychiatric:        Mood and Affect: Mood normal.        Behavior: Behavior normal.        Thought Content: Thought content normal.        Judgment: Judgment normal.       Impression and Plan:  Encounter for preventive health examination  Essential hypertension -     CBC with Differential/Platelet; Future -     Comprehensive metabolic panel with GFR; Future  History of breast cancer  Abnormal liver enzymes -     TSH; Future -     Lipid panel; Future -     Vitamin B12; Future  Vitamin D  deficiency -     VITAMIN D  25 Hydroxy (Vit-D Deficiency, Fractures); Future   -Recommend routine eye and dental care. -Healthy lifestyle discussed in detail. -Labs to be updated today. -Prostate cancer screening: Not applicable Health Maintenance  Topic Date Due   DTaP/Tdap/Td vaccine (2 - Tdap) 02/18/2019   Zoster (Shingles) Vaccine (2 of 2) 08/11/2024   COVID-19 Vaccine (9 - Pfizer risk 2025-26 season) 11/10/2024   Medicare Annual Wellness Visit  06/22/2025   Pneumococcal Vaccine for age over 21  Completed   Flu Shot  Completed   Osteoporosis screening with Bone Density Scan  Completed   Meningitis B Vaccine  Aged Out   Breast Cancer Screening  Discontinued     -Will obtain second shingles and Tdap at pharmacy.    Tully Theophilus Andrews, MD [facility]     [1]  Allergies Allergen Reactions   Bactrim  [Sulfamethoxazole -Trimethoprim ]     Pt stated that it caused her to have body aches   Codeine Nausea Only   Doxycycline Dermatitis   Levofloxacin Rash  [2]  Current Outpatient Medications:    cephALEXin (KEFLEX) 500 MG capsule, Take 500 mg by mouth 2 (two) times daily., Disp: , Rfl:    estradiol  (ESTRACE  VAGINAL) 0.1 MG/GM vaginal cream, Place 1 g vaginally 3 (three) times a week., Disp: 42.5 g, Rfl: 3   hydrOXYzine  (VISTARIL ) 25 MG capsule, TAKE 1 CAPSULE (25 MG TOTAL) BY MOUTH AT BEDTIME AS NEEDED., Disp: 30 capsule, Rfl: 0   Ibuprofen 200 MG CAPS, Take 200-600 mg by mouth every 6 (six) hours as needed (for pain, fever, or headaches)., Disp: , Rfl:    Prenat-Fe Carbonyl-FA-Omega 3 (ONE-A-DAY WOMENS PRENATAL 1)  28-0.8-235 MG CAPS, Take 1 tablet by mouth daily with breakfast., Disp: , Rfl:   Current Facility-Administered Medications:    denosumab  (PROLIA ) injection 60 mg, 60 mg, Subcutaneous, Q6 months, Theophilus Andrews, Tully GRADE, MD  "

## 2024-08-07 ENCOUNTER — Telehealth: Payer: Self-pay

## 2024-08-07 ENCOUNTER — Encounter: Payer: Self-pay | Admitting: Internal Medicine

## 2024-08-07 LAB — CBC WITH DIFFERENTIAL/PLATELET
Basophils Absolute: 0.1 K/uL (ref 0.0–0.1)
Basophils Relative: 0.1 % (ref 0.0–3.0)
Eosinophils Absolute: 0.1 K/uL (ref 0.0–0.7)
Eosinophils Relative: 0.1 % (ref 0.0–5.0)
HCT: 38.2 % (ref 36.0–46.0)
Hemoglobin: 12.6 g/dL (ref 12.0–15.0)
Lymphocytes Relative: 93.5 % — ABNORMAL HIGH (ref 12.0–46.0)
Lymphs Abs: 47.4 K/uL — ABNORMAL HIGH (ref 0.7–4.0)
MCHC: 33 g/dL (ref 30.0–36.0)
MCV: 104.2 fl — ABNORMAL HIGH (ref 78.0–100.0)
Monocytes Absolute: 0.7 K/uL (ref 0.1–1.0)
Monocytes Relative: 1.4 % — ABNORMAL LOW (ref 3.0–12.0)
Neutro Abs: 2.5 K/uL (ref 1.4–7.7)
Neutrophils Relative %: 4.9 % — ABNORMAL LOW (ref 43.0–77.0)
Platelets: 88 K/uL — ABNORMAL LOW (ref 150.0–400.0)
RBC: 3.66 Mil/uL — ABNORMAL LOW (ref 3.87–5.11)
RDW: 14.7 % (ref 11.5–15.5)
WBC: 50.7 K/uL (ref 4.0–10.5)

## 2024-08-07 LAB — COMPREHENSIVE METABOLIC PANEL WITH GFR
ALT: 15 U/L (ref 3–35)
AST: 23 U/L (ref 5–37)
Albumin: 4.4 g/dL (ref 3.5–5.2)
Alkaline Phosphatase: 54 U/L (ref 39–117)
BUN: 19 mg/dL (ref 6–23)
CO2: 27 meq/L (ref 19–32)
Calcium: 9.2 mg/dL (ref 8.4–10.5)
Chloride: 105 meq/L (ref 96–112)
Creatinine, Ser: 0.8 mg/dL (ref 0.40–1.20)
GFR: 66.42 mL/min
Glucose, Bld: 85 mg/dL (ref 70–99)
Potassium: 4.6 meq/L (ref 3.5–5.1)
Sodium: 138 meq/L (ref 135–145)
Total Bilirubin: 0.8 mg/dL (ref 0.2–1.2)
Total Protein: 6.8 g/dL (ref 6.0–8.3)

## 2024-08-07 LAB — LIPID PANEL
Cholesterol: 175 mg/dL (ref 28–200)
HDL: 52.5 mg/dL
LDL Cholesterol: 107 mg/dL — ABNORMAL HIGH (ref 10–99)
NonHDL: 122.05
Total CHOL/HDL Ratio: 3
Triglycerides: 74 mg/dL (ref 10.0–149.0)
VLDL: 14.8 mg/dL (ref 0.0–40.0)

## 2024-08-07 LAB — TSH: TSH: 1.61 u[IU]/mL (ref 0.35–5.50)

## 2024-08-07 LAB — VITAMIN B12: Vitamin B-12: 231 pg/mL (ref 211–911)

## 2024-08-07 LAB — VITAMIN D 25 HYDROXY (VIT D DEFICIENCY, FRACTURES): VITD: 26.32 ng/mL — ABNORMAL LOW (ref 30.00–100.00)

## 2024-08-07 NOTE — Telephone Encounter (Signed)
 Thank you, labs look stable

## 2024-08-07 NOTE — Telephone Encounter (Signed)
 Pt has history of CLL, this is normal for her, does not need immediate action, ok to notify patient and CC labs to Dr. Lonn

## 2024-08-07 NOTE — Telephone Encounter (Signed)
 CRITICAL VALUE STICKER  CRITICAL VALUE: WBC 50.7  RECEIVER (on-site recipient of call): Collie Hind, BSN, RN   DATE & TIME NOTIFIED:  08/07/2024 1105  MESSENGER (representative from lab): Darice  MD NOTIFIED: Dr. Ozell 08/07/2024  TIME OF NOTIFICATION: 1106  RESPONSE: Aware

## 2024-08-07 NOTE — Telephone Encounter (Signed)
 Patient informed of the results and chart was forwarded to Dr Lonn.

## 2024-08-10 ENCOUNTER — Other Ambulatory Visit (HOSPITAL_COMMUNITY): Payer: Self-pay

## 2024-08-10 NOTE — Telephone Encounter (Signed)
 SABRA

## 2024-08-11 NOTE — Telephone Encounter (Signed)
 Pt ready for scheduling for PROLIA  on or after : 08/11/24  Option# 1: Buy/Bill (Office supplied medication)  Out-of-pocket cost due at time of clinic visit: $0  Number of injection/visits approved: 2  Primary: [employer] Prolia  co-insurance: 0% Admin fee co-insurance: 0%  Secondary: --- Prolia  co-insurance:  Admin fee co-insurance:   Medical Benefit Details: Date Benefits were checked: 08/11/24 Deductible: NO/ Coinsurance: 0%/ Admin Fee: 0%  Prior Auth: APPROVED PA# [id number] Expiration Date: 07/30/24-07/29/25   # of doses approved: 2 ----------------------------------------------------------------------- Option# 2- Med Obtained from pharmacy:  Pharmacy benefit: Copay $64 (Paid to pharmacy) Admin Fee: 0% (Pay at clinic)  Prior Auth: N/A PA# Expiration Date:   # of doses approved:   If patient wants fill through the pharmacy benefit please send prescription to: [facility], and include estimated need by date in rx notes. Pharmacy will ship medication directly to the office.  Patient NOT eligible for Prolia  Copay Card. Copay Card can make patient's cost as little as $25. Link to apply: [email or web address]://[email or web address]  ** This summary of benefits is an estimation of the patient's out-of-pocket cost. Exact cost may very based on individual plan coverage.

## 2024-08-11 NOTE — Telephone Encounter (Signed)
 April [name]

## 2024-08-12 ENCOUNTER — Telehealth: Payer: Self-pay | Admitting: *Deleted

## 2024-08-12 NOTE — Telephone Encounter (Signed)
 Patient is scheduled for 08/13/24 at 3 pm.

## 2024-08-12 NOTE — Telephone Encounter (Signed)
 Patient would like to know her lab results.  She is worried about her RBC.

## 2024-08-12 NOTE — Telephone Encounter (Signed)
 Left message with female for patient to call back to schedule her Prolia  injection.

## 2024-08-12 NOTE — Telephone Encounter (Signed)
 Patient is ready for scheduling on or after 08/11/2024.  Please contact patient for scheduling and advise of $0 copay. Please respond with appointment date and time.

## 2024-08-13 ENCOUNTER — Ambulatory Visit

## 2024-08-13 ENCOUNTER — Ambulatory Visit: Payer: Self-pay | Admitting: Internal Medicine

## 2024-08-13 DIAGNOSIS — E559 Vitamin D deficiency, unspecified: Secondary | ICD-10-CM

## 2024-08-13 DIAGNOSIS — M810 Age-related osteoporosis without current pathological fracture: Secondary | ICD-10-CM

## 2024-08-13 MED ORDER — VITAMIN D (ERGOCALCIFEROL) 1.25 MG (50000 UNIT) PO CAPS: 50000.0000 [IU] | ORAL_CAPSULE | ORAL | 0 refills | Status: AC

## 2024-08-13 MED ORDER — DENOSUMAB 60 MG/ML ~~LOC~~ SOSY: 60.0000 mg | PREFILLED_SYRINGE | SUBCUTANEOUS | Status: AC

## 2024-08-13 NOTE — Progress Notes (Signed)
 Patient is in office today for a nurse visit for  Prolia Injection . Patient Injection was given in the  Right arm. Patient tolerated injection well.

## 2024-08-13 NOTE — Telephone Encounter (Signed)
 Dr Theophilus sent a message to MyChart.

## 2024-08-19 ENCOUNTER — Other Ambulatory Visit (HOSPITAL_BASED_OUTPATIENT_CLINIC_OR_DEPARTMENT_OTHER): Payer: Self-pay

## 2024-08-19 MED ORDER — SHINGRIX 50 MCG/0.5ML IM SUSR
0.5000 mL | Freq: Once | INTRAMUSCULAR | 0 refills | Status: AC
  Filled 2024-08-19: qty 0.5, 1d supply, fill #0

## 2024-11-03 ENCOUNTER — Ambulatory Visit: Admitting: Hematology and Oncology

## 2024-11-03 ENCOUNTER — Other Ambulatory Visit

## 2024-11-19 ENCOUNTER — Other Ambulatory Visit

## 2025-02-11 ENCOUNTER — Ambulatory Visit
